# Patient Record
Sex: Female | Born: 1965 | Race: Black or African American | Hispanic: No | Marital: Married | Smoking: Former smoker
Health system: Southern US, Community
[De-identification: ages and names within clinical notes are randomized; demographics above are authoritative.]

## PROBLEM LIST (undated history)

## (undated) DIAGNOSIS — K219 Gastro-esophageal reflux disease without esophagitis: Secondary | ICD-10-CM

## (undated) DIAGNOSIS — J45909 Unspecified asthma, uncomplicated: Secondary | ICD-10-CM

## (undated) DIAGNOSIS — E119 Type 2 diabetes mellitus without complications: Secondary | ICD-10-CM

## (undated) DIAGNOSIS — E785 Hyperlipidemia, unspecified: Secondary | ICD-10-CM

## (undated) DIAGNOSIS — Z972 Presence of dental prosthetic device (complete) (partial): Secondary | ICD-10-CM

## (undated) DIAGNOSIS — K859 Acute pancreatitis without necrosis or infection, unspecified: Secondary | ICD-10-CM

## (undated) DIAGNOSIS — J449 Chronic obstructive pulmonary disease, unspecified: Secondary | ICD-10-CM

## (undated) DIAGNOSIS — J189 Pneumonia, unspecified organism: Secondary | ICD-10-CM

## (undated) DIAGNOSIS — R011 Cardiac murmur, unspecified: Secondary | ICD-10-CM

## (undated) HISTORY — PX: APPENDECTOMY: SHX54

## (undated) HISTORY — DX: Type 2 diabetes mellitus without complications: E11.9

## (undated) HISTORY — PX: LUMBAR FUSION: SHX111

## (undated) HISTORY — DX: Hyperlipidemia, unspecified: E78.5

## (undated) HISTORY — PX: OTHER SURGICAL HISTORY: SHX169

---

## 2009-01-27 ENCOUNTER — Emergency Department (HOSPITAL_COMMUNITY): Admission: EM | Admit: 2009-01-27 | Discharge: 2009-01-27 | Payer: Self-pay | Admitting: Emergency Medicine

## 2009-05-04 ENCOUNTER — Emergency Department (HOSPITAL_COMMUNITY): Admission: EM | Admit: 2009-05-04 | Discharge: 2009-05-04 | Payer: Self-pay | Admitting: Emergency Medicine

## 2009-05-23 ENCOUNTER — Emergency Department (HOSPITAL_COMMUNITY): Admission: EM | Admit: 2009-05-23 | Discharge: 2009-05-23 | Payer: Self-pay | Admitting: Emergency Medicine

## 2009-05-23 ENCOUNTER — Emergency Department (HOSPITAL_COMMUNITY): Admission: EM | Admit: 2009-05-23 | Discharge: 2009-05-24 | Payer: Self-pay | Admitting: Emergency Medicine

## 2009-08-17 ENCOUNTER — Emergency Department (HOSPITAL_COMMUNITY): Admission: EM | Admit: 2009-08-17 | Discharge: 2009-08-17 | Payer: Self-pay | Admitting: Emergency Medicine

## 2010-04-21 IMAGING — CT CT ABDOMEN W/O CM
2 of 4 series · 17 of 46 positions shown, 19 images · non-contrast
Comparison: None

CT ABDOMEN

CLINICAL DATA: Left flank and pelvic pain for 2 weeks getting
worse today.  Nausea.

CT OF THE ABDOMEN AND PELVIS WITHOUT CONTRAST (CT UROGRAM)
TECHNIQUE: Multidetector CT imaging was performed through the
abdomen and pelvis to include the urinary tract.

[Series 2: stone_wo 5.0 b40f st · axial · 0.73mm/px · z∈[-480,-96]mm · 14 of 106 slices shown, 16 images]
[im 5/106  soft-tissue]
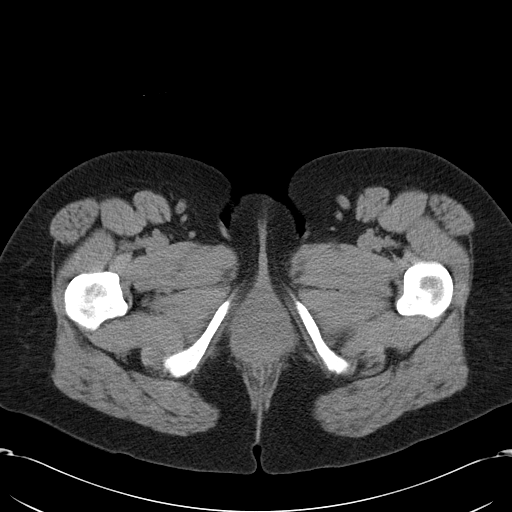
[im 5/106  bone]
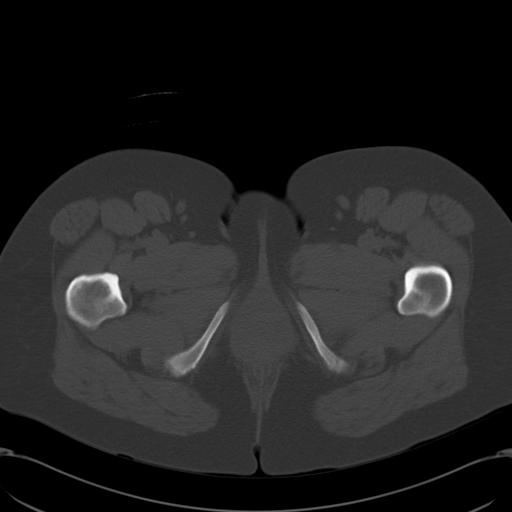
[im 14/106  soft-tissue]
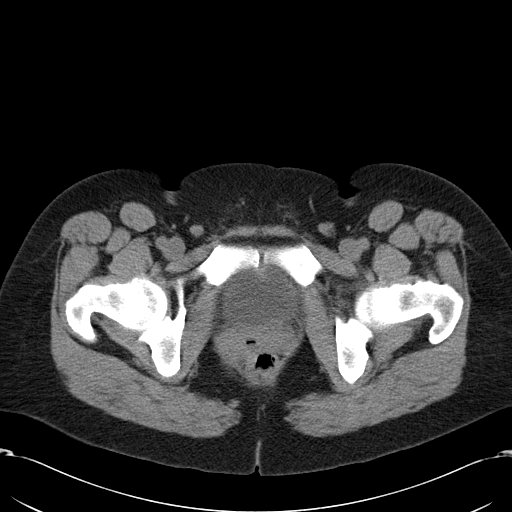
[im 22/106  soft-tissue]
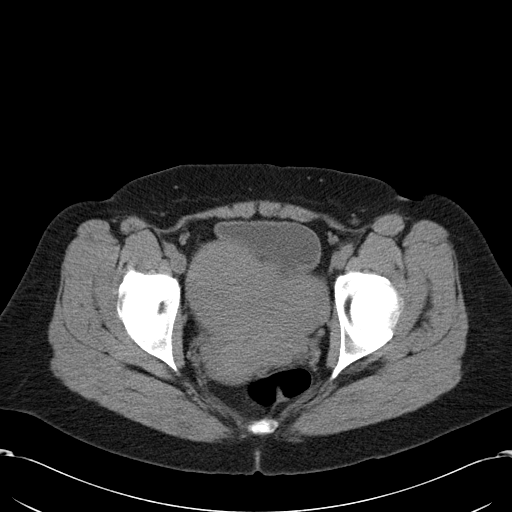
[im 27/106  soft-tissue]
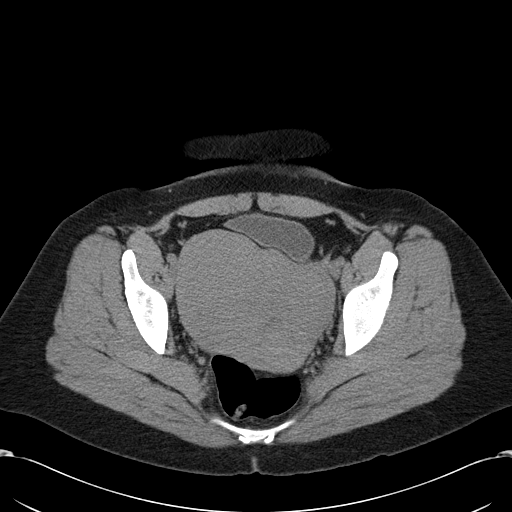
[im 36/106  soft-tissue]
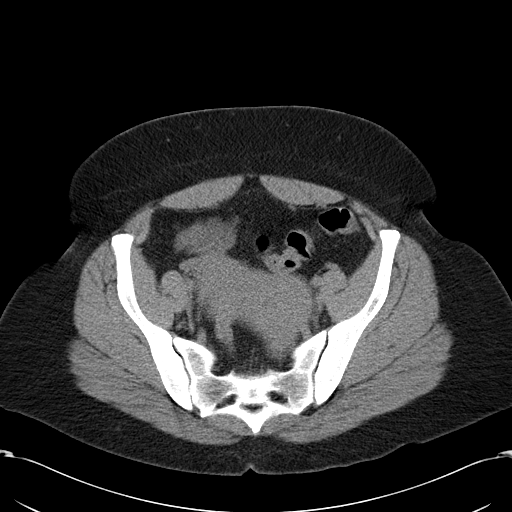
[im 44/106  soft-tissue]
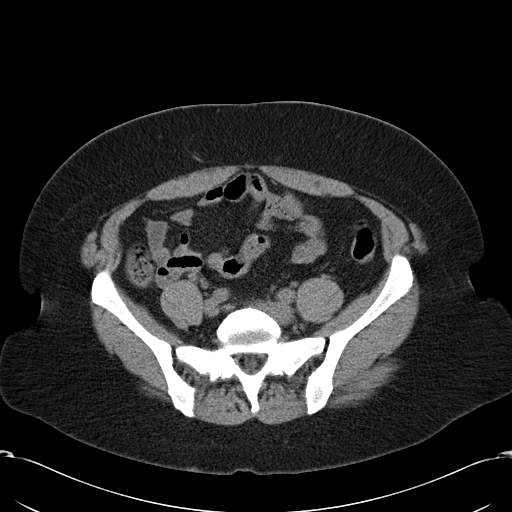
[im 49/106  soft-tissue]
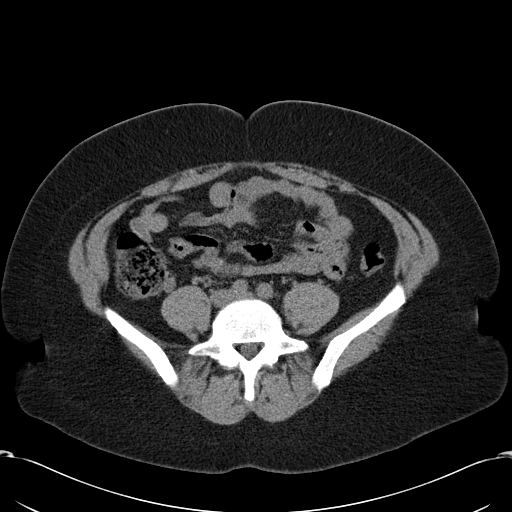
[im 57/106  soft-tissue]
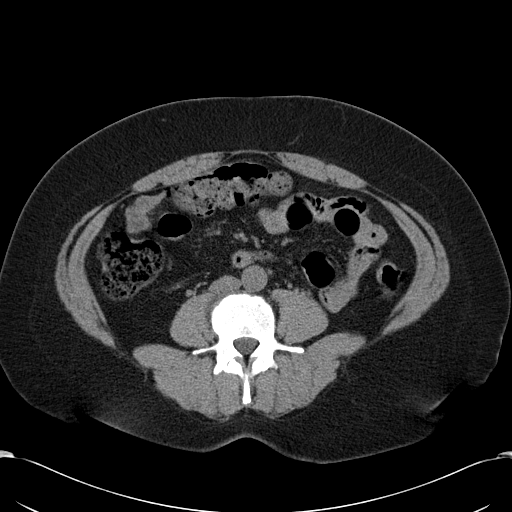
[im 62/106  soft-tissue]
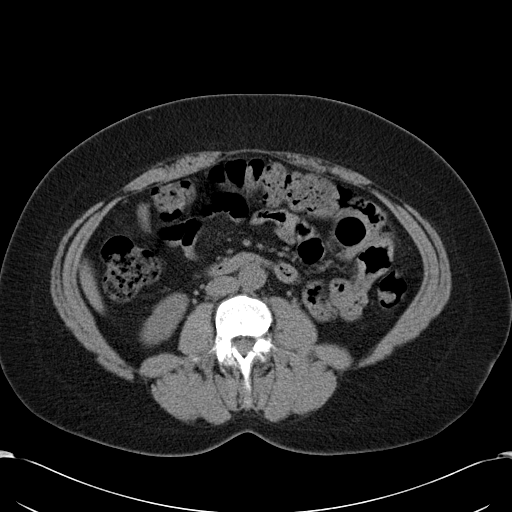
[im 62/106  bone]
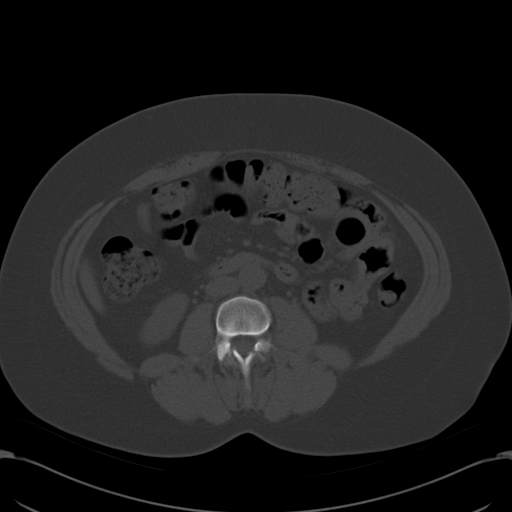
[im 71/106  soft-tissue]
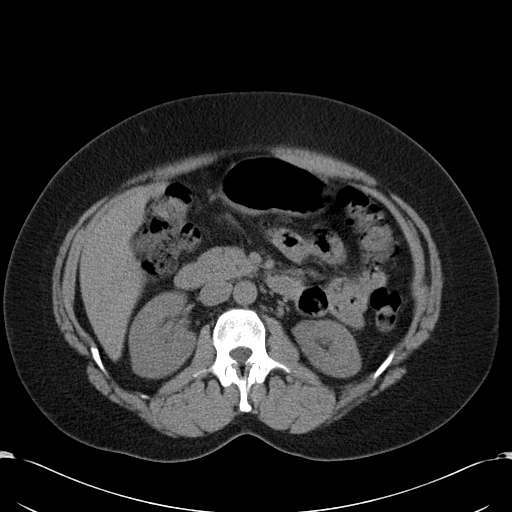
[im 79/106  soft-tissue]
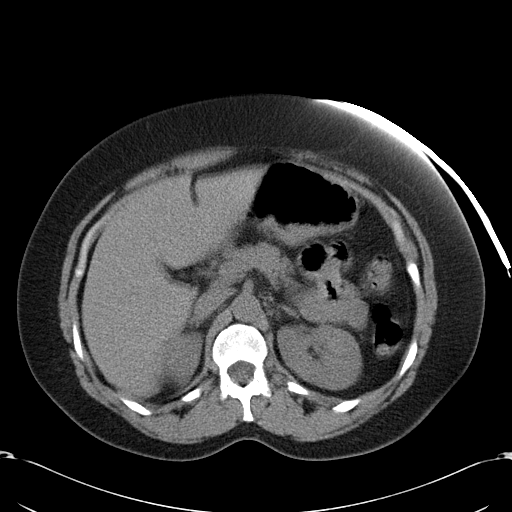
[im 84/106  soft-tissue]
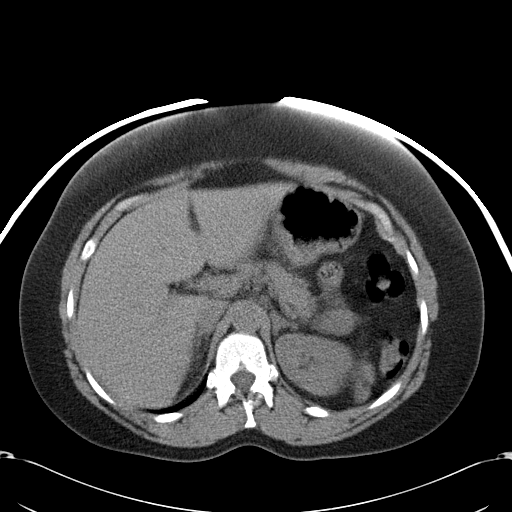
[im 92/106  soft-tissue]
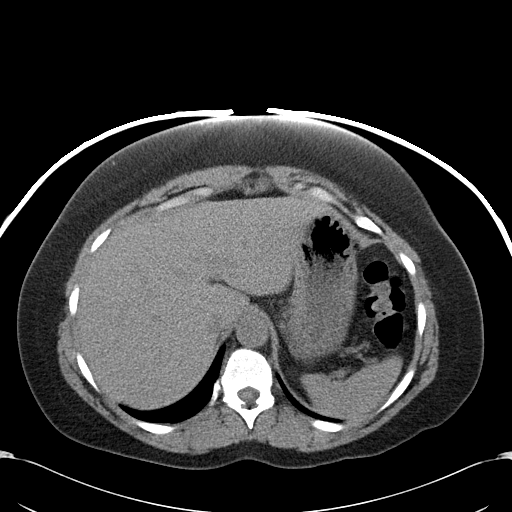
[im 101/106  soft-tissue]
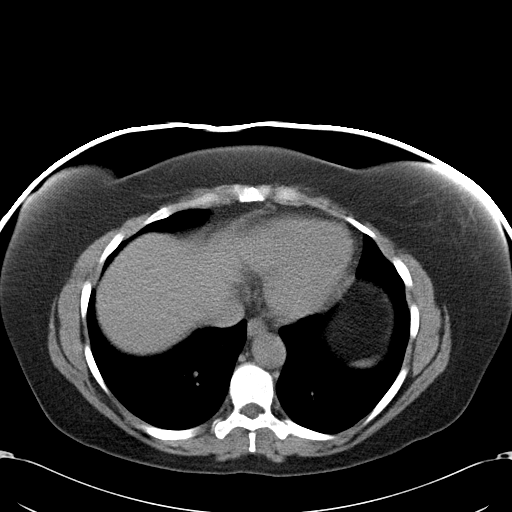

[Series 602: coronal · coronal · 0.86mm/px · 3 of 69 slices shown]
[im 23/69  soft-tissue]
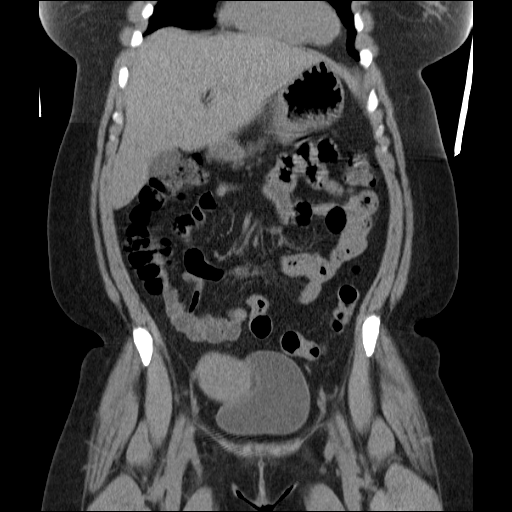
[im 31/69  soft-tissue]
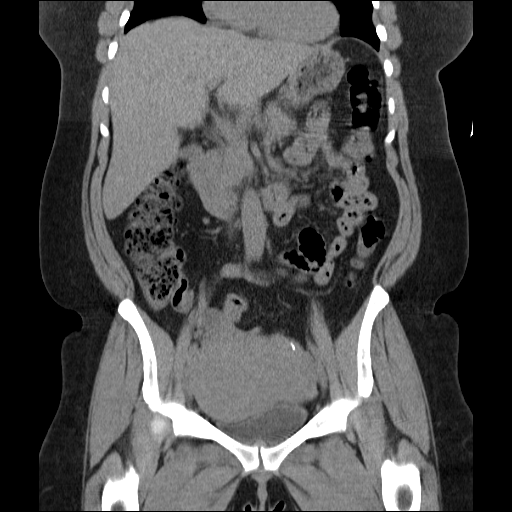
[im 38/69  soft-tissue]
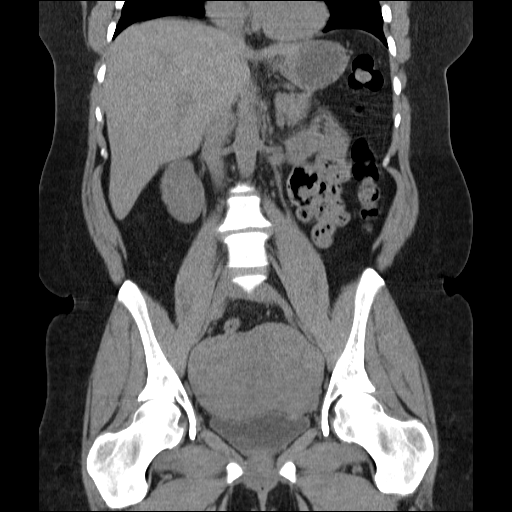

[17 of 46 positions shown; findings below may reference images not displayed]

FINDINGS: The liver, spleen, pancreas, adrenal glands, and kidneys
are normal.  The visualized bowel is normal.  There is no free air
or free fluid.  No bony abnormality. No renal or ureteral calculi.
IMPRESSION: Benign-appearing abdomen.

CT PELVIS
FINDINGS: The uterus and large is enlarged measuring 12 x 11 x 8
cm, probably due to fibroids.  The ovaries demonstrate no
significant abnormalities.  There is a 16 mm low density lesion on
the right ovary, consistent with a cyst.

There is no free fluid or other significant abnormality.  Bony
structures are normal.
IMPRESSION: No acute abnormalities.  Enlarged uterus secondary to fibroids.
Small cyst on the right ovary.

## 2011-01-11 LAB — COMPREHENSIVE METABOLIC PANEL
ALT: 18 U/L (ref 0–35)
AST: 22 U/L (ref 0–37)
Albumin: 3.7 g/dL (ref 3.5–5.2)
Alkaline Phosphatase: 75 U/L (ref 39–117)
BUN: 9 mg/dL (ref 6–23)
Chloride: 103 mEq/L (ref 96–112)
Potassium: 4.1 mEq/L (ref 3.5–5.1)
Sodium: 139 mEq/L (ref 135–145)
Total Bilirubin: 0.6 mg/dL (ref 0.3–1.2)
Total Protein: 8.1 g/dL (ref 6.0–8.3)

## 2011-01-11 LAB — DIFFERENTIAL
Basophils Absolute: 0.1 10*3/uL (ref 0.0–0.1)
Basophils Relative: 1 % (ref 0–1)
Eosinophils Absolute: 0.2 10*3/uL (ref 0.0–0.7)
Eosinophils Relative: 2 % (ref 0–5)
Monocytes Absolute: 1.2 10*3/uL — ABNORMAL HIGH (ref 0.1–1.0)
Monocytes Relative: 9 % (ref 3–12)
Neutro Abs: 7.8 10*3/uL — ABNORMAL HIGH (ref 1.7–7.7)

## 2011-01-11 LAB — CBC
HCT: 41.5 % (ref 36.0–46.0)
Platelets: 276 10*3/uL (ref 150–400)
RDW: 15.8 % — ABNORMAL HIGH (ref 11.5–15.5)
WBC: 13.8 10*3/uL — ABNORMAL HIGH (ref 4.0–10.5)

## 2011-01-11 LAB — URINALYSIS, ROUTINE W REFLEX MICROSCOPIC
Bilirubin Urine: NEGATIVE
Glucose, UA: NEGATIVE mg/dL
Hgb urine dipstick: NEGATIVE
Ketones, ur: NEGATIVE mg/dL
Protein, ur: NEGATIVE mg/dL
Urobilinogen, UA: 0.2 mg/dL (ref 0.0–1.0)

## 2011-01-11 LAB — ETHANOL: Alcohol, Ethyl (B): <5 mg/dL (ref 0–10)

## 2011-01-11 LAB — RAPID URINE DRUG SCREEN, HOSP PERFORMED
Benzodiazepines: NOT DETECTED
Cocaine: POSITIVE — AB
Opiates: NOT DETECTED
Tetrahydrocannabinol: POSITIVE — AB

## 2011-01-11 LAB — URINE MICROSCOPIC-ADD ON

## 2011-01-14 LAB — URINALYSIS, ROUTINE W REFLEX MICROSCOPIC
Bilirubin Urine: NEGATIVE
Glucose, UA: NEGATIVE mg/dL
Ketones, ur: NEGATIVE mg/dL
Nitrite: NEGATIVE
Protein, ur: NEGATIVE mg/dL
pH: 8 (ref 5.0–8.0)

## 2011-01-14 LAB — CBC
HCT: 40.5 % (ref 36.0–46.0)
HCT: 41.4 % (ref 36.0–46.0)
Hemoglobin: 13.4 g/dL (ref 12.0–15.0)
Hemoglobin: 14 g/dL (ref 12.0–15.0)
MCHC: 33.2 g/dL (ref 30.0–36.0)
Platelets: 311 10*3/uL (ref 150–400)
RBC: 4.37 MIL/uL (ref 3.87–5.11)
RDW: 14.4 % (ref 11.5–15.5)
WBC: 18.9 10*3/uL — ABNORMAL HIGH (ref 4.0–10.5)

## 2011-01-14 LAB — COMPREHENSIVE METABOLIC PANEL
ALT: 19 U/L (ref 0–35)
Albumin: 3.8 g/dL (ref 3.5–5.2)
Alkaline Phosphatase: 62 U/L (ref 39–117)
BUN: 7 mg/dL (ref 6–23)
CO2: 25 mEq/L (ref 19–32)
Calcium: 8.8 mg/dL (ref 8.4–10.5)
Chloride: 103 mEq/L (ref 96–112)
GFR calc non Af Amer: >60 mL/min (ref >60)
Glucose, Bld: 100 mg/dL — ABNORMAL HIGH (ref 70–99)
Glucose, Bld: 140 mg/dL — ABNORMAL HIGH (ref 70–99)
Potassium: 3.9 mEq/L (ref 3.5–5.1)
Sodium: 136 mEq/L (ref 135–145)
Total Bilirubin: 0.8 mg/dL (ref 0.3–1.2)
Total Protein: 6.9 g/dL (ref 6.0–8.3)
Total Protein: 7.6 g/dL (ref 6.0–8.3)

## 2011-01-14 LAB — DIFFERENTIAL
Basophils Absolute: 0 10*3/uL (ref 0.0–0.1)
Basophils Relative: 0 % (ref 0–1)
Eosinophils Absolute: 0.1 10*3/uL (ref 0.0–0.7)
Monocytes Relative: 5 % (ref 3–12)
Neutrophils Relative %: 79 % — ABNORMAL HIGH (ref 43–77)

## 2011-01-14 LAB — POCT PREGNANCY, URINE: Preg Test, Ur: NEGATIVE

## 2011-01-14 LAB — LIPASE, BLOOD: Lipase: 40 U/L (ref 11–59)

## 2011-01-15 LAB — CBC
HCT: 42.5 % (ref 36.0–46.0)
Hemoglobin: 14.2 g/dL (ref 12.0–15.0)
MCV: 95.1 fL (ref 78.0–100.0)
RBC: 4.47 MIL/uL (ref 3.87–5.11)
WBC: 15.8 10*3/uL — ABNORMAL HIGH (ref 4.0–10.5)

## 2011-01-15 LAB — POCT CARDIAC MARKERS: Myoglobin, poc: 58.1 ng/mL (ref 12–200)

## 2011-01-15 LAB — DIFFERENTIAL
Eosinophils Absolute: 0 10*3/uL (ref 0.0–0.7)
Eosinophils Relative: 0 % (ref 0–5)
Lymphs Abs: 2.4 10*3/uL (ref 0.7–4.0)
Monocytes Absolute: 0.7 10*3/uL (ref 0.1–1.0)
Monocytes Relative: 4 % (ref 3–12)

## 2011-01-15 LAB — CARDIAC PANEL(CRET KIN+CKTOT+MB+TROPI): Relative Index: 0.7 (ref 0.0–2.5)

## 2011-01-15 LAB — AMYLASE: Amylase: 138 U/L — ABNORMAL HIGH (ref 27–131)

## 2012-06-07 ENCOUNTER — Emergency Department (HOSPITAL_COMMUNITY)
Admission: EM | Admit: 2012-06-07 | Discharge: 2012-06-08 | Disposition: A | Discharge status: Home / Self Care | Payer: Medicaid Other | Attending: Emergency Medicine | Admitting: Emergency Medicine

## 2012-06-07 ENCOUNTER — Encounter (HOSPITAL_COMMUNITY): Payer: Self-pay | Admitting: *Deleted

## 2012-06-07 DIAGNOSIS — R10819 Abdominal tenderness, unspecified site: Secondary | ICD-10-CM | POA: Insufficient documentation

## 2012-06-07 DIAGNOSIS — R110 Nausea: Secondary | ICD-10-CM | POA: Insufficient documentation

## 2012-06-07 DIAGNOSIS — K7689 Other specified diseases of liver: Secondary | ICD-10-CM | POA: Insufficient documentation

## 2012-06-07 DIAGNOSIS — I498 Other specified cardiac arrhythmias: Secondary | ICD-10-CM | POA: Insufficient documentation

## 2012-06-07 DIAGNOSIS — R1013 Epigastric pain: Secondary | ICD-10-CM

## 2012-06-07 DIAGNOSIS — F411 Generalized anxiety disorder: Secondary | ICD-10-CM | POA: Insufficient documentation

## 2012-06-07 LAB — CBC WITH DIFFERENTIAL/PLATELET
Basophils Absolute: 0.1 10*3/uL (ref 0.0–0.1)
Basophils Relative: 0 % (ref 0–1)
Hemoglobin: 14.3 g/dL (ref 12.0–15.0)
MCHC: 33.3 g/dL (ref 30.0–36.0)
Monocytes Relative: 7 % (ref 3–12)
Neutro Abs: 7.7 10*3/uL (ref 1.7–7.7)
Neutrophils Relative %: 55 % (ref 43–77)

## 2012-06-07 LAB — URINALYSIS, ROUTINE W REFLEX MICROSCOPIC
Glucose, UA: NEGATIVE mg/dL
Leukocytes, UA: NEGATIVE
Protein, ur: NEGATIVE mg/dL
Specific Gravity, Urine: 1.017 (ref 1.005–1.030)
pH: 5.5 (ref 5.0–8.0)

## 2012-06-07 LAB — PREGNANCY, URINE: Preg Test, Ur: NEGATIVE

## 2012-06-07 NOTE — ED Notes (Signed)
Lab called  The blood has clotted and the orders need to ne  Placed and blood drawn again

## 2012-06-07 NOTE — ED Notes (Signed)
Generalized abd pain for one week .  No bm for 2 days nauseated.  lmp 3 months ago

## 2012-06-07 NOTE — ED Notes (Signed)
NURSE EXPLAINED DELAY / PROCESS.  

## 2012-06-08 ENCOUNTER — Emergency Department (HOSPITAL_COMMUNITY): Payer: Medicaid Other

## 2012-06-08 LAB — POCT I-STAT TROPONIN I: Troponin i, poc: 0 ng/mL (ref 0.00–0.08)

## 2012-06-08 MED ORDER — IOHEXOL 300 MG/ML  SOLN
100.0000 mL | Freq: Once | INTRAMUSCULAR | Status: AC | PRN
  Administered 2012-06-08: 100 mL via INTRAVENOUS

## 2012-06-08 MED ORDER — MORPHINE SULFATE 4 MG/ML IJ SOLN
4.0000 mg | Freq: Once | INTRAMUSCULAR | Status: AC
  Administered 2012-06-08: 4 mg via INTRAVENOUS
  Filled 2012-06-08: qty 1

## 2012-06-08 MED ORDER — ESOMEPRAZOLE MAGNESIUM 40 MG PO CPDR: 40.0000 mg | DELAYED_RELEASE_CAPSULE | Freq: Every day | ORAL | Status: DC

## 2012-06-08 MED ORDER — SODIUM CHLORIDE 0.9 % IV BOLUS (SEPSIS)
1000.0000 mL | Freq: Once | INTRAVENOUS | Status: AC
  Administered 2012-06-08: 1000 mL via INTRAVENOUS

## 2012-06-08 MED ORDER — ONDANSETRON HCL 4 MG PO TABS: 4.0000 mg | ORAL_TABLET | Freq: Four times a day (QID) | ORAL | Status: AC

## 2012-06-08 MED ORDER — ONDANSETRON HCL 4 MG/2ML IJ SOLN
4.0000 mg | Freq: Once | INTRAMUSCULAR | Status: AC
  Administered 2012-06-08: 4 mg via INTRAVENOUS
  Filled 2012-06-08: qty 2

## 2012-06-08 MED ORDER — FAMOTIDINE IN NACL 20-0.9 MG/50ML-% IV SOLN
20.0000 mg | Freq: Once | INTRAVENOUS | Status: AC
  Administered 2012-06-08: 20 mg via INTRAVENOUS
  Filled 2012-06-08: qty 50

## 2012-06-08 MED ORDER — IOHEXOL 300 MG/ML  SOLN
20.0000 mL | INTRAMUSCULAR | Status: AC
  Administered 2012-06-08: 20 mL via ORAL

## 2012-06-08 NOTE — ED Notes (Signed)
PT TRANSPORTED TO ULTRASOUND

## 2012-06-08 NOTE — ED Notes (Signed)
CT informed that pt has finished drinking contrast

## 2012-06-08 NOTE — ED Provider Notes (Signed)
History     CSN: [id number]  Arrival date & time 06/07/12  2001   First MD Initiated Contact with Patient 06/08/12 0010      Chief Complaint  Patient presents with  . Abdominal Pain    (Consider location/radiation/quality/duration/timing/severity/associated sxs/prior treatment) HPI  [name] is a 46 year old woman with no significant past medical history other than GERD. She presents with complaints of epigastric pain and right upper cord or abdominal pain. She developed right upper quadrant abdominal pain about 2 weeks ago. This pain has been intermittent but daily. It seems to come on without any precipitating factors or events. It lasts anywhere from 30 minutes to several hours at a time and then resolves without intervention. The patient has associated nausea but no vomiting. She has not experienced associated fever, diarrhea, melena, hematochezia or genitourinary symptoms.  She comes in today because her pain has shifted to the midline and has become more severe. Her pain has been 8/10 in maximum severity. It is aching and radiates from the midline epigastrium to right upper quadrant. Again, no fevers. Patient's only previous abdominal surgery is remote appendectomy.  Patient says she is pain free at the time of my initial exam.   PMH: tobacco abuse, GERD  PSH: appendectomy  No family history on file.  History  Substance Use Topics  . Smoking status: Current Everyday Smoker  . Smokeless tobacco: Not on file  . Alcohol Use: Yes    OB History    Grav Para Term Preterm Abortions TAB SAB Ect Mult Living                  Review of Systems  Gen: no weight loss, fevers, chills, night sweats Eyes: no discharge or drainage, no occular pain or visual changes Nose: no epistaxis or rhinorrhea Mouth: no dental pain, no sore throat Neck: no neck pain Lungs: no SOB, cough, wheezing CV: no chest pain, palpitations, dependent edema or orthopnea Abd: as per hpi, otherwise  negative GU: no dysuria or gross hematuria MSK: no myalgias or arthralgias Neuro: no headache, no focal neurologic deficits Skin: no rash Psyche: negative.  Allergies  Review of patient's allergies indicates no known allergies.  Home Medications  No current outpatient prescriptions on file.  BP 123/66  Pulse 70  Temp 98.9 F (37.2 C) (Oral)  Resp 19  SpO2 100%  LMP 03/07/2012  Physical Exam  Gen: well developed and well nourished appearing Head: NCAT Eyes: PERL, EOMI Nose: no epistaixis or rhinorrhea Mouth/throat: mucosa is moist and pink Neck: supple, no stridor CV: RRR, no murmur, good peripheral pulses Lungs: CTA B, no wheezing, rhonchi or rales Abd: soft, ttp over the RUQ with positive Murphy's sign, nondistended, mild midline epigastric ttp without guarding Back: no ttp, no cva ttp Skin: no rashese, wnl, well perfused appearing Neuro: CN ii-xii grossly intact, no focal deficits Psyche; mildly anxious affect,  calm and cooperative.   ED Course  Procedures (including critical care time)  Labs Reviewed  CBC WITH DIFFERENTIAL - Abnormal; Notable for the following:    WBC 14.1 (*)     Lymphs Abs 5.1 (*)     All other components within normal limits  URINALYSIS, ROUTINE W REFLEX MICROSCOPIC  PREGNANCY, URINE  COMPREHENSIVE METABOLIC PANEL   US Abdomen Complete  06/08/2012  *RADIOLOGY REPORT*  Clinical Data:  Right upper quadrant abdominal pain  ABDOMINAL ULTRASOUND COMPLETE  Comparison:  05/23/2009  Findings:  Gallbladder:  No gallstones, gallbladder wall thickening, or pericholecystic  fluid.  Common Bile Duct:  Within normal limits in caliber.  Liver: Appears echogenic suggesting fatty infiltration.  IVC:  Appears normal.  Pancreas:  Limited due to overlying bowel gas.  No abnormality noted.  Spleen:  Within normal limits in size and echotexture.  Right kidney:  Normal in size and parenchymal echogenicity.  No evidence of mass or hydronephrosis.  Left kidney:  Normal  in size and parenchymal echogenicity.  No evidence of mass or hydronephrosis.  Abdominal Aorta:  No aneurysm identified.  IMPRESSION:  1.  No acute findings. 2.  Fatty infiltration the liver.   Original Report Authenticated By: Rosealee Albee, M.D.     Jardine.Pyle: Patient re-evaluated by me at approx 0200. At that time, she was in tears and complaining of recurrent pain. She was treated with MS, IVF and pepcid. Shortly after that, transported to U/S.  Pain improved. U/S non-diagnostic. At this point, we will pursue CT of the abd/pelvis with iv contrast to rule out colitis, evaluate for mesenteric ischemia, perforated viscous. We will obtain EKG as well as lactate level.  Work up is thus far non-diagnostic but is ongoing.   0350:  Patient re-evaluated. She is feeling better. Pain resolved after tx with MS, IVF and Zofran.   EKG: sinus bradycardia, no acute ischemic changes, normal intervals, normal axis, normal qrs complex. No olds for comparison.    MDM   ED workup is nondiagnostic but notable for white blood cells count of 14,000 in the setting of epigastric pain right upper quadrant pain. We have ruled out acute cholecystitis and gallstones with right upper quadrant ultrasound. The patient's lipase is normal ruling out pancreatitis. Repeat abdominal exam is benign. Episodic nature of pain does make the query acalculus cholecystitis v. PUD v gastritis.  No signs of colitis, SBO or any other acute process on contrasted CT scan.  The patient is tolerating po, feeling better and asking to go home.   She is stable for discharge referral for close outpatient followup and instructions that she should discuss indication for HIDA scan with her outpatient physician. We will also start her on Nexium. She has been taking an over-the-counter H2 blocking medication.  The patient is counseled to please return to the emergency department for worsening symptoms, fever or any other health concerns.        Brandt Loosen, MD 06/08/12 [id number]

## 2012-11-04 ENCOUNTER — Ambulatory Visit
Admission: RE | Admit: 2012-11-04 | Discharge: 2012-11-04 | Disposition: A | Discharge status: Home / Self Care | Payer: Medicaid Other | Source: Ambulatory Visit | Attending: Family Medicine | Admitting: Family Medicine

## 2012-11-04 ENCOUNTER — Other Ambulatory Visit: Payer: Self-pay | Admitting: Family Medicine

## 2012-11-04 DIAGNOSIS — M1250 Traumatic arthropathy, unspecified site: Secondary | ICD-10-CM

## 2012-12-04 ENCOUNTER — Encounter: Payer: Self-pay | Admitting: Dietician

## 2012-12-04 ENCOUNTER — Encounter: Payer: Medicaid Other | Attending: Family Medicine | Admitting: *Deleted

## 2012-12-04 ENCOUNTER — Encounter: Payer: Self-pay | Admitting: *Deleted

## 2012-12-04 DIAGNOSIS — E119 Type 2 diabetes mellitus without complications: Secondary | ICD-10-CM | POA: Insufficient documentation

## 2012-12-04 DIAGNOSIS — Z713 Dietary counseling and surveillance: Secondary | ICD-10-CM | POA: Insufficient documentation

## 2012-12-04 NOTE — Progress Notes (Signed)
  Medical Nutrition Therapy:  Appt start time: 1130 end time:  1230.  Assessment:  Primary concerns today: patient here due to new diagnosis of diabetes. She does not work, lives with husband and 3 grand daughters ages 35-12. She does the grocery shopping and food preparation for the family. She became teary when talking about her diagnosis of diabetes stating she was scared and didn't know what she was going to do. Activity is limited to small amounts of walking her girls to the bus stop each AM.   MEDICATIONS: see list   DIETARY INTAKE:  Usual eating pattern includes 3 meals and 0-1 snacks per day.  Everyday foods include good variety of all food groups.  Avoided foods include : none stated.    24-hr recall:  B ( AM): coffee with 2 tsp sugar and extra cream  Snk ( AM): none  L ( PM): prepares most meals at home: fries and a burger OR fish OR anything around, water Snk ( PM): maybe cake or cookies if in the house D ( PM): meat, starch and vegetable type meal OR eat out on Saturday usually or an easy meal at home, water Snk ( PM): cake, cookies etc that she bakes Beverages: coffee with cream and water  Usual physical activity: has back problems, walks to bus stop for kids school, cleans house daily  Estimated energy needs: 1400 calories 158 g carbohydrates 105 g protein 39 g fat  Progress Towards Goal(s):  In progress.   Nutritional Diagnosis:  NB-1.1 Food and nutrition-related knowledge deficit As related to new diagnosis of diabetes.  As evidenced by A1c of 7.2%.    Intervention:  Nutrition counseling and diabetes education initiated. Discussed basic physiology of diabetes, SMBG and rationale of checking BG at alternate times of day, A1c, Carb Counting and reading food labels. Plan to discuss benefits of increased activity at next visit.   Plan:  Aim for 3 Carb Choices per meal (45 grams) +/- 1 either way  Aim for 0-1 Carbs per snack if hungry  Consider reading food labels for  Total Carbohydrate of foods Consider asking your MD about you checking BG at alternate times per day  Handouts given during visit include: Living Well with Diabetes Carb Counting and Food Label handouts Meal Plan Card  Monitoring/Evaluation:  Dietary intake, exercise, reading food labels, and body weight in 4 week(s).

## 2012-12-04 NOTE — Patient Instructions (Addendum)
Plan:  Aim for 3 Carb Choices per meal (45 grams) +/- 1 either way  Aim for 0-1 Carbs per snack if hungry  Consider reading food labels for Total Carbohydrate of foods Consider asking your MD about you checking BG at alternate times per day

## 2012-12-25 ENCOUNTER — Encounter: Payer: Self-pay | Admitting: *Deleted

## 2013-01-01 ENCOUNTER — Ambulatory Visit: Payer: Medicaid Other | Admitting: *Deleted

## 2013-01-06 ENCOUNTER — Ambulatory Visit: Payer: Medicaid Other | Attending: Family Medicine

## 2013-01-06 DIAGNOSIS — R293 Abnormal posture: Secondary | ICD-10-CM | POA: Insufficient documentation

## 2013-01-06 DIAGNOSIS — IMO0001 Reserved for inherently not codable concepts without codable children: Secondary | ICD-10-CM | POA: Insufficient documentation

## 2013-01-06 DIAGNOSIS — R5381 Other malaise: Secondary | ICD-10-CM | POA: Insufficient documentation

## 2013-01-06 DIAGNOSIS — M545 Low back pain, unspecified: Secondary | ICD-10-CM | POA: Insufficient documentation

## 2013-01-06 DIAGNOSIS — M25559 Pain in unspecified hip: Secondary | ICD-10-CM | POA: Insufficient documentation

## 2013-01-13 ENCOUNTER — Ambulatory Visit: Payer: Medicaid Other | Attending: Family Medicine

## 2013-01-13 ENCOUNTER — Other Ambulatory Visit: Payer: Self-pay | Admitting: Family Medicine

## 2013-01-13 DIAGNOSIS — R5381 Other malaise: Secondary | ICD-10-CM | POA: Insufficient documentation

## 2013-01-13 DIAGNOSIS — IMO0001 Reserved for inherently not codable concepts without codable children: Secondary | ICD-10-CM | POA: Insufficient documentation

## 2013-01-13 DIAGNOSIS — M25559 Pain in unspecified hip: Secondary | ICD-10-CM | POA: Insufficient documentation

## 2013-01-13 DIAGNOSIS — R293 Abnormal posture: Secondary | ICD-10-CM | POA: Insufficient documentation

## 2013-01-13 DIAGNOSIS — M545 Low back pain, unspecified: Secondary | ICD-10-CM | POA: Insufficient documentation

## 2013-01-14 ENCOUNTER — Ambulatory Visit
Admission: RE | Admit: 2013-01-14 | Discharge: 2013-01-14 | Disposition: A | Discharge status: Home / Self Care | Payer: Medicaid Other | Source: Ambulatory Visit | Attending: Family Medicine | Admitting: Family Medicine

## 2013-01-14 DIAGNOSIS — M545 Low back pain: Secondary | ICD-10-CM

## 2013-01-20 ENCOUNTER — Ambulatory Visit: Payer: Medicaid Other

## 2013-01-27 ENCOUNTER — Ambulatory Visit: Payer: Medicaid Other

## 2013-07-16 ENCOUNTER — Other Ambulatory Visit: Payer: Self-pay | Admitting: Orthopedic Surgery

## 2013-07-18 ENCOUNTER — Other Ambulatory Visit: Payer: Self-pay | Admitting: Orthopedic Surgery

## 2013-07-24 ENCOUNTER — Encounter (HOSPITAL_COMMUNITY): Payer: Self-pay | Admitting: Pharmacy Technician

## 2013-07-25 ENCOUNTER — Encounter (HOSPITAL_COMMUNITY)
Admission: RE | Admit: 2013-07-25 | Discharge: 2013-07-25 | Disposition: A | Discharge status: Home / Self Care | Payer: Medicaid Other | Source: Ambulatory Visit | Attending: Orthopedic Surgery | Admitting: Orthopedic Surgery

## 2013-07-25 ENCOUNTER — Encounter (HOSPITAL_COMMUNITY): Payer: Self-pay

## 2013-07-25 DIAGNOSIS — Z01812 Encounter for preprocedural laboratory examination: Secondary | ICD-10-CM | POA: Insufficient documentation

## 2013-07-25 DIAGNOSIS — Z01818 Encounter for other preprocedural examination: Secondary | ICD-10-CM | POA: Insufficient documentation

## 2013-07-25 DIAGNOSIS — Z01810 Encounter for preprocedural cardiovascular examination: Secondary | ICD-10-CM | POA: Insufficient documentation

## 2013-07-25 HISTORY — DX: Cardiac murmur, unspecified: R01.1

## 2013-07-25 HISTORY — DX: Gastro-esophageal reflux disease without esophagitis: K21.9

## 2013-07-25 LAB — CBC WITH DIFFERENTIAL/PLATELET
Eosinophils Relative: 2 % (ref 0–5)
Lymphocytes Relative: 34 % (ref 12–46)
Lymphs Abs: 4.8 10*3/uL — ABNORMAL HIGH (ref 0.7–4.0)
MCV: 92.6 fL (ref 78.0–100.0)
Monocytes Relative: 6 % (ref 3–12)
Neutrophils Relative %: 58 % (ref 43–77)
Platelets: 280 10*3/uL (ref 150–400)
RBC: 5.29 MIL/uL — ABNORMAL HIGH (ref 3.87–5.11)
WBC: 14.2 10*3/uL — ABNORMAL HIGH (ref 4.0–10.5)

## 2013-07-25 LAB — COMPREHENSIVE METABOLIC PANEL
ALT: 27 U/L (ref 0–35)
AST: 24 U/L (ref 0–37)
Alkaline Phosphatase: 86 U/L (ref 39–117)
CO2: 23 mEq/L (ref 19–32)
Calcium: 10 mg/dL (ref 8.4–10.5)
Chloride: 101 mEq/L (ref 96–112)
GFR calc Af Amer: 87 mL/min — ABNORMAL LOW (ref >90)
GFR calc non Af Amer: 75 mL/min — ABNORMAL LOW (ref >90)
Glucose, Bld: 161 mg/dL — ABNORMAL HIGH (ref 70–99)
Potassium: 4.5 mEq/L (ref 3.5–5.1)
Sodium: 138 mEq/L (ref 135–145)
Total Protein: 8.5 g/dL — ABNORMAL HIGH (ref 6.0–8.3)

## 2013-07-25 LAB — ABO/RH: ABO/RH(D): A NEG

## 2013-07-25 LAB — URINALYSIS, ROUTINE W REFLEX MICROSCOPIC
Glucose, UA: 100 mg/dL — AB
Hgb urine dipstick: NEGATIVE
Ketones, ur: 15 mg/dL — AB
Leukocytes, UA: NEGATIVE
Specific Gravity, Urine: 1.026 (ref 1.005–1.030)
Urobilinogen, UA: 0.2 mg/dL (ref 0.0–1.0)

## 2013-07-25 LAB — TYPE AND SCREEN

## 2013-07-25 LAB — SURGICAL PCR SCREEN
MRSA, PCR: NEGATIVE
Staphylococcus aureus: NEGATIVE

## 2013-07-25 NOTE — Pre-Procedure Instructions (Signed)
[name]  07/25/2013   Your procedure is scheduled on:  October 23  Report to [facility] Entrance "A" [address] at [employer] [phone] at 8 am for arrival time  Call this number if you have problems the morning of surgery: [phone]   Remember:   Do not eat food or drink liquids after midnight.   Take these medicines the morning of surgery with A SIP OF WATER: Albuterol (if needed), Hydrocodone, Omeprazole   Do not wear jewelry, make-up or nail polish.  Do not wear lotions, powders, or perfumes. You may wear deodorant.  Do not shave 48 hours prior to surgery. Men may shave face and neck.  Do not bring valuables to the hospital.  [facility] is not responsible                  for any belongings or valuables.               Contacts, dentures or bridgework may not be worn into surgery.  Leave suitcase in the car. After surgery it may be brought to your room.  For patients admitted to the hospital, discharge time is determined by your                treatment team.               Special Instructions: Shower using CHG 2 nights before surgery and the night before surgery.  If you shower the day of surgery use CHG.  Use special wash - you have one bottle of CHG for all showers.  You should use approximately 1/3 of the bottle for each shower.   Please read over the following fact sheets that you were given: Pain Booklet, Coughing and Deep Breathing, Blood Transfusion Information, MRSA Information and Surgical Site Infection Prevention

## 2013-07-30 MED ORDER — CEFAZOLIN SODIUM-DEXTROSE 2-3 GM-% IV SOLR
2.0000 g | INTRAVENOUS | Status: AC
  Administered 2013-07-31: 2 g via INTRAVENOUS
  Filled 2013-07-30: qty 50

## 2013-07-30 NOTE — Progress Notes (Signed)
PATIENT STATES SHE WAS TOLD TO ARRIVE AT 1000 AM.

## 2013-07-31 ENCOUNTER — Inpatient Hospital Stay (HOSPITAL_COMMUNITY)
Admission: RE | Admit: 2013-07-31 | Discharge: 2013-08-03 | DRG: 455 | Disposition: A | Discharge status: Home / Self Care | Payer: Medicaid Other | Source: Ambulatory Visit | Attending: Orthopedic Surgery | Admitting: Orthopedic Surgery

## 2013-07-31 ENCOUNTER — Encounter (HOSPITAL_COMMUNITY)
Admission: RE | Disposition: A | Discharge status: Home / Self Care | Payer: Self-pay | Source: Ambulatory Visit | Attending: Orthopedic Surgery

## 2013-07-31 ENCOUNTER — Inpatient Hospital Stay (HOSPITAL_COMMUNITY): Payer: Medicaid Other | Admitting: Anesthesiology

## 2013-07-31 ENCOUNTER — Inpatient Hospital Stay (HOSPITAL_COMMUNITY): Payer: Medicaid Other

## 2013-07-31 ENCOUNTER — Encounter (HOSPITAL_COMMUNITY): Payer: Self-pay | Admitting: Anesthesiology

## 2013-07-31 ENCOUNTER — Encounter (HOSPITAL_COMMUNITY): Payer: Medicaid Other | Admitting: Anesthesiology

## 2013-07-31 DIAGNOSIS — K219 Gastro-esophageal reflux disease without esophagitis: Secondary | ICD-10-CM | POA: Diagnosis present

## 2013-07-31 DIAGNOSIS — Z79899 Other long term (current) drug therapy: Secondary | ICD-10-CM

## 2013-07-31 DIAGNOSIS — E119 Type 2 diabetes mellitus without complications: Secondary | ICD-10-CM | POA: Diagnosis present

## 2013-07-31 DIAGNOSIS — M48061 Spinal stenosis, lumbar region without neurogenic claudication: Principal | ICD-10-CM | POA: Diagnosis present

## 2013-07-31 DIAGNOSIS — F17200 Nicotine dependence, unspecified, uncomplicated: Secondary | ICD-10-CM | POA: Diagnosis present

## 2013-07-31 DIAGNOSIS — E785 Hyperlipidemia, unspecified: Secondary | ICD-10-CM | POA: Diagnosis present

## 2013-07-31 HISTORY — PX: ANTERIOR LAT LUMBAR FUSION: SHX1168

## 2013-07-31 LAB — POCT I-STAT 4, (NA,K, GLUC, HGB,HCT)
Glucose, Bld: 158 mg/dL — ABNORMAL HIGH (ref 70–99)
Potassium: 4.1 mEq/L (ref 3.5–5.1)
Sodium: 139 mEq/L (ref 135–145)

## 2013-07-31 LAB — GLUCOSE, CAPILLARY
Glucose-Capillary: 132 mg/dL — ABNORMAL HIGH (ref 70–99)
Glucose-Capillary: 193 mg/dL — ABNORMAL HIGH (ref 70–99)

## 2013-07-31 SURGERY — ANTERIOR LATERAL LUMBAR FUSION 1 LEVEL
Anesthesia: General | Site: Spine Lumbar | Wound class: Clean

## 2013-07-31 MED ORDER — MORPHINE SULFATE (PF) 1 MG/ML IV SOLN
INTRAVENOUS | Status: AC
  Administered 2013-07-31: 7 mg via INTRAVENOUS
  Filled 2013-07-31: qty 25

## 2013-07-31 MED ORDER — HYDROMORPHONE HCL PF 1 MG/ML IJ SOLN
0.2500 mg | INTRAMUSCULAR | Status: DC | PRN
  Administered 2013-07-31 (×3): 0.5 mg via INTRAVENOUS

## 2013-07-31 MED ORDER — ONDANSETRON HCL 4 MG/2ML IJ SOLN
INTRAMUSCULAR | Status: DC | PRN
  Administered 2013-07-31: 4 mg via INTRAMUSCULAR

## 2013-07-31 MED ORDER — ONDANSETRON HCL 4 MG/2ML IJ SOLN
4.0000 mg | INTRAMUSCULAR | Status: DC | PRN
  Administered 2013-08-01: 4 mg via INTRAVENOUS
  Filled 2013-07-31: qty 2

## 2013-07-31 MED ORDER — MEPERIDINE HCL 25 MG/ML IJ SOLN: 6.2500 mg | INTRAMUSCULAR | Status: DC | PRN

## 2013-07-31 MED ORDER — PANTOPRAZOLE SODIUM 40 MG PO TBEC
80.0000 mg | DELAYED_RELEASE_TABLET | Freq: Every day | ORAL | Status: DC
  Administered 2013-07-31 – 2013-08-03 (×4): 80 mg via ORAL
  Filled 2013-07-31 (×4): qty 2

## 2013-07-31 MED ORDER — LACTATED RINGERS IV SOLN
INTRAVENOUS | Status: DC | PRN
  Administered 2013-07-31 (×2): via INTRAVENOUS

## 2013-07-31 MED ORDER — ALBUTEROL SULFATE HFA 108 (90 BASE) MCG/ACT IN AERS
2.0000 | INHALATION_SPRAY | Freq: Four times a day (QID) | RESPIRATORY_TRACT | Status: DC | PRN
  Filled 2013-07-31: qty 6.7

## 2013-07-31 MED ORDER — METFORMIN HCL 500 MG PO TABS
500.0000 mg | ORAL_TABLET | Freq: Two times a day (BID) | ORAL | Status: DC
  Administered 2013-07-31 – 2013-08-03 (×6): 500 mg via ORAL
  Filled 2013-07-31 (×8): qty 1

## 2013-07-31 MED ORDER — BUPIVACAINE-EPINEPHRINE PF 0.25-1:200000 % IJ SOLN
INTRAMUSCULAR | Status: AC
  Filled 2013-07-31: qty 30

## 2013-07-31 MED ORDER — PHENOL 1.4 % MT LIQD: 1.0000 | OROMUCOSAL | Status: DC | PRN

## 2013-07-31 MED ORDER — ONDANSETRON HCL 4 MG/2ML IJ SOLN: 4.0000 mg | Freq: Once | INTRAMUSCULAR | Status: DC | PRN

## 2013-07-31 MED ORDER — THROMBIN 20000 UNITS EX SOLR
CUTANEOUS | Status: AC
  Filled 2013-07-31: qty 20000

## 2013-07-31 MED ORDER — HYDROMORPHONE HCL PF 1 MG/ML IJ SOLN
INTRAMUSCULAR | Status: AC
  Filled 2013-07-31: qty 1

## 2013-07-31 MED ORDER — SODIUM CHLORIDE 0.9 % IJ SOLN
3.0000 mL | Freq: Two times a day (BID) | INTRAMUSCULAR | Status: DC
  Administered 2013-08-01 – 2013-08-02 (×2): 3 mL via INTRAVENOUS

## 2013-07-31 MED ORDER — THROMBIN 20000 UNITS EX SOLR
CUTANEOUS | Status: DC | PRN
  Administered 2013-07-31: 15:00:00

## 2013-07-31 MED ORDER — ACETAMINOPHEN 650 MG RE SUPP: 650.0000 mg | RECTAL | Status: DC | PRN

## 2013-07-31 MED ORDER — FENTANYL CITRATE 0.05 MG/ML IJ SOLN
INTRAMUSCULAR | Status: DC | PRN
  Administered 2013-07-31: 100 ug via INTRAVENOUS
  Administered 2013-07-31: 50 ug via INTRAVENOUS
  Administered 2013-07-31: 150 ug via INTRAVENOUS
  Administered 2013-07-31: 25 ug via INTRAVENOUS

## 2013-07-31 MED ORDER — MORPHINE SULFATE 2 MG/ML IJ SOLN
1.0000 mg | INTRAMUSCULAR | Status: DC | PRN
  Administered 2013-08-01 (×2): 2 mg via INTRAVENOUS
  Filled 2013-07-31 (×2): qty 1

## 2013-07-31 MED ORDER — BISACODYL 5 MG PO TBEC: 5.0000 mg | DELAYED_RELEASE_TABLET | Freq: Every day | ORAL | Status: DC | PRN

## 2013-07-31 MED ORDER — ATORVASTATIN CALCIUM 20 MG PO TABS
20.0000 mg | ORAL_TABLET | Freq: Every day | ORAL | Status: DC
  Administered 2013-08-01 – 2013-08-02 (×2): 20 mg via ORAL
  Filled 2013-07-31 (×3): qty 1

## 2013-07-31 MED ORDER — SODIUM CHLORIDE 0.9 % IJ SOLN: 9.0000 mL | INTRAMUSCULAR | Status: DC | PRN

## 2013-07-31 MED ORDER — SODIUM CHLORIDE 0.9 % IV SOLN: 250.0000 mL | INTRAVENOUS | Status: DC

## 2013-07-31 MED ORDER — OXYCODONE HCL 5 MG PO TABS
5.0000 mg | ORAL_TABLET | Freq: Once | ORAL | Status: AC | PRN
  Administered 2013-07-31: 5 mg via ORAL

## 2013-07-31 MED ORDER — ZOLPIDEM TARTRATE 5 MG PO TABS
5.0000 mg | ORAL_TABLET | Freq: Every evening | ORAL | Status: DC | PRN
  Filled 2013-07-31: qty 1

## 2013-07-31 MED ORDER — HYDROMORPHONE HCL PF 1 MG/ML IJ SOLN
INTRAMUSCULAR | Status: DC | PRN
  Administered 2013-07-31 (×4): .25 mg via INTRAVENOUS

## 2013-07-31 MED ORDER — SITAGLIP PHOS-METFORMIN HCL ER 50-500 MG PO TB24: ORAL_TABLET | Freq: Two times a day (BID) | ORAL | Status: DC

## 2013-07-31 MED ORDER — MIDAZOLAM HCL 5 MG/5ML IJ SOLN
INTRAMUSCULAR | Status: DC | PRN
  Administered 2013-07-31: 2 mg via INTRAVENOUS

## 2013-07-31 MED ORDER — ALUM & MAG HYDROXIDE-SIMETH 200-200-20 MG/5ML PO SUSP: 30.0000 mL | Freq: Four times a day (QID) | ORAL | Status: DC | PRN

## 2013-07-31 MED ORDER — POVIDONE-IODINE 7.5 % EX SOLN
Freq: Once | CUTANEOUS | Status: DC
  Filled 2013-07-31: qty 118

## 2013-07-31 MED ORDER — MORPHINE SULFATE (PF) 1 MG/ML IV SOLN
INTRAVENOUS | Status: DC
  Administered 2013-07-31: 11.5 mg via INTRAVENOUS
  Administered 2013-07-31: 18:00:00 via INTRAVENOUS
  Administered 2013-08-01: 9 mg via INTRAVENOUS
  Administered 2013-08-01: 05:00:00 via INTRAVENOUS
  Administered 2013-08-01: 1.5 mg via INTRAVENOUS
  Filled 2013-07-31: qty 25

## 2013-07-31 MED ORDER — DIPHENHYDRAMINE HCL 12.5 MG/5ML PO ELIX: 12.5000 mg | ORAL_SOLUTION | Freq: Four times a day (QID) | ORAL | Status: DC | PRN

## 2013-07-31 MED ORDER — OXYCODONE HCL 5 MG/5ML PO SOLN: 5.0000 mg | Freq: Once | ORAL | Status: AC | PRN

## 2013-07-31 MED ORDER — BUPIVACAINE-EPINEPHRINE 0.25% -1:200000 IJ SOLN
INTRAMUSCULAR | Status: DC | PRN
  Administered 2013-07-31: 10 mL

## 2013-07-31 MED ORDER — SODIUM CHLORIDE 0.9 % IV SOLN
INTRAVENOUS | Status: DC
  Administered 2013-08-01: 07:00:00 via INTRAVENOUS

## 2013-07-31 MED ORDER — DIPHENHYDRAMINE HCL 50 MG/ML IJ SOLN: 12.5000 mg | Freq: Four times a day (QID) | INTRAMUSCULAR | Status: DC | PRN

## 2013-07-31 MED ORDER — SODIUM CHLORIDE 0.9 % IJ SOLN: 3.0000 mL | INTRAMUSCULAR | Status: DC | PRN

## 2013-07-31 MED ORDER — LIDOCAINE HCL (CARDIAC) 20 MG/ML IV SOLN
INTRAVENOUS | Status: DC | PRN
  Administered 2013-07-31: 100 mg via INTRAVENOUS

## 2013-07-31 MED ORDER — ONDANSETRON HCL 4 MG/2ML IJ SOLN: 4.0000 mg | Freq: Four times a day (QID) | INTRAMUSCULAR | Status: DC | PRN

## 2013-07-31 MED ORDER — DIAZEPAM 5 MG PO TABS
5.0000 mg | ORAL_TABLET | Freq: Four times a day (QID) | ORAL | Status: DC | PRN
  Administered 2013-07-31 – 2013-08-02 (×6): 5 mg via ORAL
  Filled 2013-07-31 (×5): qty 1

## 2013-07-31 MED ORDER — 0.9 % SODIUM CHLORIDE (POUR BTL) OPTIME
TOPICAL | Status: DC | PRN
  Administered 2013-07-31: 1000 mL

## 2013-07-31 MED ORDER — FLEET ENEMA 7-19 GM/118ML RE ENEM: 1.0000 | ENEMA | Freq: Once | RECTAL | Status: AC | PRN

## 2013-07-31 MED ORDER — SUCCINYLCHOLINE CHLORIDE 20 MG/ML IJ SOLN
INTRAMUSCULAR | Status: DC | PRN
  Administered 2013-07-31: 140 mg via INTRAVENOUS

## 2013-07-31 MED ORDER — CEFAZOLIN SODIUM 1-5 GM-% IV SOLN
1.0000 g | Freq: Three times a day (TID) | INTRAVENOUS | Status: AC
  Administered 2013-07-31 – 2013-08-01 (×2): 1 g via INTRAVENOUS
  Filled 2013-07-31 (×2): qty 50

## 2013-07-31 MED ORDER — OXYCODONE-ACETAMINOPHEN 5-325 MG PO TABS
1.0000 | ORAL_TABLET | ORAL | Status: DC | PRN
  Administered 2013-08-01 – 2013-08-03 (×8): 2 via ORAL
  Filled 2013-07-31 (×8): qty 2

## 2013-07-31 MED ORDER — SENNOSIDES-DOCUSATE SODIUM 8.6-50 MG PO TABS: 1.0000 | ORAL_TABLET | Freq: Every evening | ORAL | Status: DC | PRN

## 2013-07-31 MED ORDER — MENTHOL 3 MG MT LOZG: 1.0000 | LOZENGE | OROMUCOSAL | Status: DC | PRN

## 2013-07-31 MED ORDER — ACETAMINOPHEN 325 MG PO TABS
650.0000 mg | ORAL_TABLET | ORAL | Status: DC | PRN
  Administered 2013-08-02 (×2): 650 mg via ORAL
  Filled 2013-07-31 (×2): qty 2

## 2013-07-31 MED ORDER — PROPOFOL 10 MG/ML IV BOLUS
INTRAVENOUS | Status: DC | PRN
  Administered 2013-07-31: 80 mg via INTRAVENOUS
  Administered 2013-07-31: 120 mg via INTRAVENOUS

## 2013-07-31 MED ORDER — LACTATED RINGERS IV SOLN
INTRAVENOUS | Status: DC
  Administered 2013-07-31: 10:00:00 via INTRAVENOUS

## 2013-07-31 MED ORDER — LINAGLIPTIN 5 MG PO TABS
5.0000 mg | ORAL_TABLET | Freq: Two times a day (BID) | ORAL | Status: DC
  Administered 2013-07-31 – 2013-08-03 (×6): 5 mg via ORAL
  Filled 2013-07-31 (×9): qty 1

## 2013-07-31 MED ORDER — NALOXONE HCL 0.4 MG/ML IJ SOLN: 0.4000 mg | INTRAMUSCULAR | Status: DC | PRN

## 2013-07-31 MED ORDER — OXYCODONE HCL 5 MG PO TABS
ORAL_TABLET | ORAL | Status: AC
  Filled 2013-07-31: qty 2

## 2013-07-31 MED ORDER — DOCUSATE SODIUM 100 MG PO CAPS
100.0000 mg | ORAL_CAPSULE | Freq: Two times a day (BID) | ORAL | Status: DC
  Administered 2013-07-31 – 2013-08-03 (×6): 100 mg via ORAL
  Filled 2013-07-31 (×6): qty 1

## 2013-07-31 SURGICAL SUPPLY — 97 items
BENZOIN TINCTURE PRP APPL 2/3 (GAUZE/BANDAGES/DRESSINGS) ×3 IMPLANT
BLADE SURG 10 STRL SS (BLADE) ×3 IMPLANT
BLADE SURG ROTATE 9660 (MISCELLANEOUS) IMPLANT
BOLT DECADE 5.5X40 (Bolt) ×6 IMPLANT
BUR ROUND PRECISION 4.0 (BURR) ×3 IMPLANT
CARTRIDGE OIL MAESTRO DRILL (MISCELLANEOUS) ×4 IMPLANT
CLOTH BEACON ORANGE TIMEOUT ST (SAFETY) ×3 IMPLANT
CLSR STERI-STRIP ANTIMIC 1/2X4 (GAUZE/BANDAGES/DRESSINGS) ×3 IMPLANT
CONT SPEC STER OR (MISCELLANEOUS) ×3 IMPLANT
CORDS BIPOLAR (ELECTRODE) ×3 IMPLANT
COVER SURGICAL LIGHT HANDLE (MISCELLANEOUS) ×3 IMPLANT
DIFFUSER DRILL AIR PNEUMATIC (MISCELLANEOUS) ×6 IMPLANT
DRAIN CHANNEL 15F RND FF W/TCR (WOUND CARE) IMPLANT
DRAPE C-ARM 42X72 X-RAY (DRAPES) ×3 IMPLANT
DRAPE ORTHO SPLIT 77X108 STRL (DRAPES) ×1
DRAPE POUCH INSTRU U-SHP 10X18 (DRAPES) ×3 IMPLANT
DRAPE SURG 17X23 STRL (DRAPES) ×15 IMPLANT
DRAPE SURG ORHT 6 SPLT 77X108 (DRAPES) ×2 IMPLANT
DRAPE U-SHAPE 47X51 STRL (DRAPES) IMPLANT
DRSG MEPILEX BORDER 4X8 (GAUZE/BANDAGES/DRESSINGS) ×3 IMPLANT
DURAPREP 26ML APPLICATOR (WOUND CARE) ×3 IMPLANT
ELECT BLADE 4.0 EZ CLEAN MEGAD (MISCELLANEOUS) ×3
ELECT BLADE 6.5 EXT (BLADE) ×3 IMPLANT
ELECT CAUTERY BLADE 6.4 (BLADE) ×3 IMPLANT
ELECT REM PT RETURN 9FT ADLT (ELECTROSURGICAL) ×3
ELECTRODE BLDE 4.0 EZ CLN MEGD (MISCELLANEOUS) ×2 IMPLANT
ELECTRODE REM PT RTRN 9FT ADLT (ELECTROSURGICAL) ×2 IMPLANT
EVACUATOR SILICONE 100CC (DRAIN) IMPLANT
GAUZE SPONGE 4X4 16PLY XRAY LF (GAUZE/BANDAGES/DRESSINGS) ×3 IMPLANT
GLOVE BIO SURGEON STRL SZ7 (GLOVE) ×6 IMPLANT
GLOVE BIO SURGEON STRL SZ8 (GLOVE) ×3 IMPLANT
GLOVE BIOGEL PI IND STRL 7.0 (GLOVE) ×2 IMPLANT
GLOVE BIOGEL PI IND STRL 7.5 (GLOVE) ×2 IMPLANT
GLOVE BIOGEL PI IND STRL 8 (GLOVE) ×2 IMPLANT
GLOVE BIOGEL PI INDICATOR 7.0 (GLOVE) ×1
GLOVE BIOGEL PI INDICATOR 7.5 (GLOVE) ×1
GLOVE BIOGEL PI INDICATOR 8 (GLOVE) ×1
GOWN STRL NON-REIN LRG LVL3 (GOWN DISPOSABLE) ×6 IMPLANT
GOWN STRL REIN XL XLG (GOWN DISPOSABLE) ×6 IMPLANT
GUIDEWIRE SHARP VIPER II (WIRE) ×3 IMPLANT
IMPLANT COROENT XL 10X18X50 (Orthopedic Implant) ×3 IMPLANT
IV CATH 14GX2 1/4 (CATHETERS) ×3 IMPLANT
K-WIRE MAXCESS 4 13.5 (Wire) ×9 IMPLANT
KIT BASIN OR (CUSTOM PROCEDURE TRAY) ×3 IMPLANT
KIT DILATOR XLIF 5 (KITS) ×2 IMPLANT
KIT MAXCESS (KITS) ×3 IMPLANT
KIT NEEDLE NVM5 EMG ELECT (KITS) ×2 IMPLANT
KIT NEEDLE NVM5 EMG ELECTRODE (KITS) ×1
KIT POSITION SURG JACKSON T1 (MISCELLANEOUS) IMPLANT
KIT ROOM TURNOVER OR (KITS) ×3 IMPLANT
KIT XLIF (KITS) ×1
MARKER SKIN DUAL TIP RULER LAB (MISCELLANEOUS) ×3 IMPLANT
NEEDLE BONE MARROW 8GX6 FENEST (NEEDLE) IMPLANT
NEEDLE HYPO 25GX1X1/2 BEV (NEEDLE) ×3 IMPLANT
NEEDLE JAMSHIDI VIPER (NEEDLE) ×6 IMPLANT
NEEDLE SPNL 18GX3.5 QUINCKE PK (NEEDLE) IMPLANT
NS IRRIG 1000ML POUR BTL (IV SOLUTION) ×3 IMPLANT
NUVASIVE GUIDEWIRE ×9 IMPLANT
OIL CARTRIDGE MAESTRO DRILL (MISCELLANEOUS) ×6
PACK LAMINECTOMY ORTHO (CUSTOM PROCEDURE TRAY) ×3 IMPLANT
PACK UNIVERSAL I (CUSTOM PROCEDURE TRAY) ×3 IMPLANT
PAD ARMBOARD 7.5X6 YLW CONV (MISCELLANEOUS) ×9 IMPLANT
PATTIES SURGICAL .5 X1 (DISPOSABLE) ×3 IMPLANT
PATTIES SURGICAL .5X1.5 (GAUZE/BANDAGES/DRESSINGS) IMPLANT
PLATE 2H 10MM (Plate) ×3 IMPLANT
PUTTY BONE DBX 2.5 MIS (Bone Implant) ×3 IMPLANT
PUTTY BONE DBX 5CC MIX (Putty) ×3 IMPLANT
ROD VIPER II LORDOSED 5.5X40 (Rod) ×3 IMPLANT
SCREW SET SINGLE INNER MIS (Screw) ×6 IMPLANT
SCREW VIPER X-TAB 6.0X40 (Screw) ×6 IMPLANT
SPONGE GAUZE 4X4 12PLY (GAUZE/BANDAGES/DRESSINGS) ×3 IMPLANT
SPONGE INTESTINAL PEANUT (DISPOSABLE) ×6 IMPLANT
SPONGE LAP 4X18 X RAY DECT (DISPOSABLE) ×3 IMPLANT
SPONGE SURGIFOAM ABS GEL 100 (HEMOSTASIS) ×3 IMPLANT
STRIP CLOSURE SKIN 1/2X4 (GAUZE/BANDAGES/DRESSINGS) ×6 IMPLANT
SURGIFLO TRUKIT (HEMOSTASIS) IMPLANT
SUT MNCRL AB 4-0 PS2 18 (SUTURE) ×6 IMPLANT
SUT PDS AB 1 CTX 36 (SUTURE) IMPLANT
SUT PROLENE 5 0 C 1 24 (SUTURE) IMPLANT
SUT SILK 2 0 TIES 10X30 (SUTURE) IMPLANT
SUT VIC AB 0 CT1 18XCR BRD 8 (SUTURE) ×2 IMPLANT
SUT VIC AB 0 CT1 8-18 (SUTURE) ×1
SUT VIC AB 1 CT1 18XCR BRD 8 (SUTURE) ×2 IMPLANT
SUT VIC AB 1 CT1 8-18 (SUTURE) ×1
SUT VIC AB 2-0 CT2 18 VCP726D (SUTURE) ×3 IMPLANT
SYR 20CC LL (SYRINGE) ×3 IMPLANT
SYR BULB IRRIGATION 50ML (SYRINGE) ×3 IMPLANT
SYR CONTROL 10ML LL (SYRINGE) ×3 IMPLANT
SYR TB 1ML LUER SLIP (SYRINGE) IMPLANT
TAP CANN VIPER2 DL 5.0 (TAP) ×3 IMPLANT
TAPE CLOTH SURG 4X10 WHT LF (GAUZE/BANDAGES/DRESSINGS) ×3 IMPLANT
TOWEL OR 17X24 6PK STRL BLUE (TOWEL DISPOSABLE) ×3 IMPLANT
TOWEL OR 17X26 10 PK STRL BLUE (TOWEL DISPOSABLE) ×3 IMPLANT
TRAY FOLEY CATH 14FRSI W/METER (CATHETERS) IMPLANT
TRAY FOLEY CATH 16FRSI W/METER (SET/KITS/TRAYS/PACK) ×3 IMPLANT
WATER STERILE IRR 1000ML POUR (IV SOLUTION) IMPLANT
YANKAUER SUCT BULB TIP NO VENT (SUCTIONS) ×3 IMPLANT

## 2013-07-31 NOTE — Anesthesia Procedure Notes (Signed)
Procedure Name: Intubation Date/Time: 07/31/2013 1:40 PM Performed by: Whitman Hero Pre-anesthesia Checklist: Patient identified, Timeout performed, Emergency Drugs available, Suction available and Patient being monitored Patient Re-evaluated:Patient Re-evaluated prior to inductionOxygen Delivery Method: Circle system utilized Preoxygenation: Pre-oxygenation with 100% oxygen Intubation Type: IV induction Ventilation: Mask ventilation without difficulty Laryngoscope Size: Mac and 3 Grade View: Grade I Tube type: Oral Tube size: 7.5 mm Number of attempts: 2 Airway Equipment and Method: Stylet Secured at: 22 cm Tube secured with: Tape Dental Injury: Teeth and Oropharynx as per pre-operative assessment

## 2013-07-31 NOTE — Transfer of Care (Signed)
Immediate Anesthesia Transfer of Care Note  Patient: [name]  Procedure(s) Performed: Procedure(s) with comments: ANTERIOR LATERAL LUMBAR FUSION 1 LEVEL/Left sided lumbar 3-4 lateral interbody fusion with instrumentation and allograft. (Left) - Left sided lumbar 3-4 lateral interbody fusion with instrumentation and allograft. POSTERIOR LUMBAR FUSION 1 LEVEL/Lumbar 3-4 posterior spinal fusion with instrumentation. (N/A) - Lumbar 3-4 posterior spinal fusion with instrumentation.  Patient Location: PACU  Anesthesia Type:General  Level of Consciousness: awake, alert  and oriented  Airway & Oxygen Therapy: Patient Spontanous Breathing and Patient connected to face mask oxygen  Post-op Assessment: Report given to PACU RN, Post -op Vital signs reviewed and stable and Patient moving all extremities  Post vital signs: Reviewed and stable  Complications: No apparent anesthesia complications

## 2013-07-31 NOTE — H&P (Signed)
PREOPERATIVE H&P  Chief Complaint: left thigh pain  HPI: [name] is a 47 y.o. female who presents with ongoing pain in the left thigh. 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 Patient has failed conservative care and has continued to have pain. xrays reveal instability at L3/4.  Past Medical History  Diagnosis Date  . Diabetes mellitus without complication   . Hyperlipidemia   . Heart murmur     mild per patient  . GERD (gastroesophageal reflux disease)    Past Surgical History  Procedure Laterality Date  . Appendectomy  30- 40 years   History   Social History  . Marital Status: Married    Spouse Name: N/A    Number of Children: N/A  . Years of Education: N/A   Social History Main Topics  . Smoking status: Current Every Day Smoker -- 0.25 packs/day for 20 years  . Smokeless tobacco: Not on file     Comment: Was at 1 ppd  . Alcohol Use: Yes     Comment: beer everynow and then per patient  . Drug Use: No  . Sexual Activity: Not on file   Other Topics Concern  . Not on file   Social History Narrative  . No narrative on file   No family history on file. No Known Allergies Prior to Admission medications   Medication Sig Start Date End Date Taking? Authorizing Provider  albuterol (PROVENTIL HFA;VENTOLIN HFA) 108 (90 BASE) MCG/ACT inhaler Inhale 2 puffs into the lungs every 6 (six) hours as needed for wheezing.   Yes Historical Provider, MD  cyclobenzaprine (FLEXERIL) 10 MG tablet Take 10 mg by mouth 2 (two) times daily as needed for muscle spasms.   Yes Historical Provider, MD  HYDROcodone-acetaminophen (NORCO) 7.5-325 MG per tablet Take 1 tablet by mouth every 4 (four) hours as needed for pain.   Yes Historical Provider, MD  omeprazole (PRILOSEC) 40 MG capsule Take 40 mg by mouth daily.   Yes Historical Provider, MD  rosuvastatin (CRESTOR) 10 MG tablet Take 10 mg by mouth daily.   Yes Historical Provider, MD  SitaGLIPtin-MetFORMIN HCl (JANUMET XR) 50-500 MG  TB24 Take by mouth 2 (two) times daily.   Yes Historical Provider, MD     All other systems have been reviewed and were otherwise negative with the exception of those mentioned in the HPI and as above.  Physical Exam: There were no vitals filed for this visit.  General: Alert, no acute distress Cardiovascular: No pedal edema Respiratory: No cyanosis, no use of accessory musculature Skin: No lesions in the area of chief complaint Neurologic: Sensation intact distally Psychiatric: Patient is competent for consent with normal mood and affect Lymphatic: No axillary or cervical lymphadenopathy   Assessment/Plan: Left leg pain Plan for Procedure(s): ANTERIOR LATERAL LUMBAR FUSION 1 LEVEL/Left sided lumbar 3-4 lateral interbody fusion with instrumentation and allograft. POSTERIOR LUMBAR FUSION 1 LEVEL/Lumbar 3-4 posterior spinal fusion with instrumentation.   Emilee Hero, MD 07/31/2013 7:27 AM

## 2013-07-31 NOTE — Anesthesia Preprocedure Evaluation (Addendum)
Anesthesia Evaluation  Patient identified by MRN, date of birth, ID band Patient awake    Reviewed: Allergy & Precautions, H&P , NPO status , Patient's Chart, lab work & pertinent test results  Airway Mallampati: I TM Distance: >3 FB Neck ROM: Full    Dental   Pulmonary          Cardiovascular     Neuro/Psych    GI/Hepatic GERD-  Medicated and Controlled,  Endo/Other  diabetes, Type 2, Insulin Dependent  Renal/GU      Musculoskeletal   Abdominal   Peds  Hematology   Anesthesia Other Findings   Reproductive/Obstetrics                           Anesthesia Physical Anesthesia Plan  ASA: II  Anesthesia Plan: General   Post-op Pain Management:    Induction: Intravenous  Airway Management Planned: Oral ETT  Additional Equipment:   Intra-op Plan:   Post-operative Plan: Extubation in OR  Informed Consent: I have reviewed the patients History and Physical, chart, labs and discussed the procedure including the risks, benefits and alternatives for the proposed anesthesia with the patient or authorized representative who has indicated his/her understanding and acceptance.     Plan Discussed with: CRNA and Surgeon  Anesthesia Plan Comments:         Anesthesia Quick Evaluation

## 2013-07-31 NOTE — OR Nursing (Signed)
[name] has had an uneventful course in pacu, she is either asleep soundly or abrubtly awake , requesting help and having pain, then falling gently back to sleep. She has hasd sips of water and oral plus injectable pain meds with inability to rate pain...either asleep or not. She moves all extremities readily/strongly and assists with turning

## 2013-07-31 NOTE — Anesthesia Postprocedure Evaluation (Signed)
Anesthesia Post Note  Patient: [name]  Procedure(s) Performed: Procedure(s) (LRB): ANTERIOR LATERAL LUMBAR FUSION 1 LEVEL/Left sided lumbar 3-4 lateral interbody fusion with instrumentation and allograft. (Left) POSTERIOR LUMBAR FUSION 1 LEVEL/Lumbar 3-4 posterior spinal fusion with instrumentation. (N/A)  Anesthesia type: General  Patient location: PACU  Post pain: Pain level controlled and Adequate analgesia  Post assessment: Post-op Vital signs reviewed, Patient's Cardiovascular Status Stable, Respiratory Function Stable, Patent Airway and Pain level controlled  Last Vitals:  Filed Vitals:   07/31/13 1900  BP:   Pulse:   Temp:   Resp: 13    Post vital signs: Reviewed and stable  Level of consciousness: awake, alert  and oriented  Complications: No apparent anesthesia complications

## 2013-08-01 ENCOUNTER — Encounter (HOSPITAL_COMMUNITY): Payer: Self-pay

## 2013-08-01 LAB — GLUCOSE, CAPILLARY: Glucose-Capillary: 176 mg/dL — ABNORMAL HIGH (ref 70–99)

## 2013-08-01 MED ORDER — INFLUENZA VAC SPLIT QUAD 0.5 ML IM SUSP
0.5000 mL | INTRAMUSCULAR | Status: DC
  Filled 2013-08-01: qty 0.5

## 2013-08-01 MED ORDER — PNEUMOCOCCAL VAC POLYVALENT 25 MCG/0.5ML IJ INJ
0.5000 mL | INJECTION | INTRAMUSCULAR | Status: DC
  Filled 2013-08-01: qty 0.5

## 2013-08-01 NOTE — Evaluation (Signed)
Physical Therapy Evaluation Patient Details Name: [name] MRN: [id number] DOB: 05-19-1966 Today's Date: 08/01/2013 Time: 4098-1191 PT Time Calculation (min): 29 min  PT Assessment / Plan / Recommendation History of Present Illness   L3-4 Lateral interbody fusion with posterior instrumentation for spinal stenosis  Clinical Impression  This patient underwent a L3-4 level ALIF and presents to PT with anticipated post-op pain, decreased functional mobility and gait.  Pt. Will benefit from acute PT to address these and below issues.      PT Assessment  Patient needs continued PT services    Follow Up Recommendations  Home health PT;Supervision/Assistance - 24 hour    Does the patient have the potential to tolerate intense rehabilitation      Barriers to Discharge        Equipment Recommendations  Rolling walker with 5" wheels    Recommendations for Other Services     Frequency Min 5X/week    Precautions / Restrictions Precautions Precautions: Back Precaution Booklet Issued: Yes (comment) Required Braces or Orthoses: Spinal Brace Spinal Brace: Applied in sitting position;Thoracolumbosacral orthotic Restrictions Weight Bearing Restrictions: No   Pertinent Vitals/Pain Pain rated 10/10 in back, RN in room and pt. Took pain medications.      Mobility  Bed Mobility Bed Mobility: Rolling Right;Right Sidelying to Sit;Sitting - Scoot to [place] of Bed Rolling Right: 3: Mod assist;With rail Right Sidelying to Sit: 1: +2 Total assist;With rails;HOB elevated Right Sidelying to Sit: Patient Percentage: 60% Sitting - Scoot to Edge of Bed: 1: +1 Total assist Details for Bed Mobility Assistance: VCs for safe technique Transfers Transfers: Sit to Stand;Stand to Sit Sit to Stand: 1: +2 Total assist;With upper extremity assist;From bed Sit to Stand: Patient Percentage: 60% Stand to Sit: 1: +2 Total assist;With upper extremity assist;To chair/3-in-1 Stand to Sit: Patient Percentage:  60% Details for Transfer Assistance: VCs for safe hand placement; pt stood for about 5 minutes saying she could not sit down because it hurt so much--we finally had to hold her hands and let her sit down that way v. reaching back for the recliner Ambulation/Gait Ambulation/Gait Assistance: 4: Min assist Ambulation Distance (Feet): 5 Feet Assistive device: Rolling walker Gait Pattern: Decreased step length - right;Decreased step length - left;Shuffle Gait velocity: decreased Stairs: No Wheelchair Mobility Wheelchair Mobility: No    Exercises     PT Diagnosis: Difficulty walking;Abnormality of gait;Generalized weakness;Acute pain  PT Problem List: Decreased strength;Decreased activity tolerance;Decreased balance;Decreased mobility;Decreased knowledge of use of DME;Decreased safety awareness;Decreased knowledge of precautions;Pain PT Treatment Interventions: DME instruction;Gait training;Stair training;Functional mobility training;Therapeutic activities;Therapeutic exercise;Balance training;Patient/family education     PT Goals(Current goals can be found in the care plan section) Acute Rehab PT Goals Patient Stated Goal: to move and go home PT Goal Formulation: With patient/family Time For Goal Achievement: 08/08/13 Potential to Achieve Goals: Good Additional Goals Additional Goal #1: Pt. will verbalize 3/3 back precautions  Visit Information  Last PT Received On: 08/01/13 Assistance Needed: +2 PT/OT Co-Evaluation/Treatment: Yes History of Present Illness:  L3-4 Lateral interbody fusion with posterior instrumentation for spinal stenosis       Prior Functioning  Home Living Family/patient expects to be discharged to:: Private residence Living Arrangements: Spouse/significant other Available Help at Discharge: Family;Available 24 hours/day Type of Home: House Home Access: Stairs to enter [employer] of Steps: 2 Entrance Stairs-Rails: None Home Layout: One  level Home Equipment: None Prior Function Level of Independence: Independent Communication Communication: No difficulties Dominant Hand: Right    Cognition  Cognition Arousal/Alertness: Awake/alert Behavior During Therapy: WFL for tasks assessed/performed Overall Cognitive Status: Within Functional Limits for tasks assessed    Extremity/Trunk Assessment Upper Extremity Assessment Upper Extremity Assessment: Defer to OT evaluation Lower Extremity Assessment Lower Extremity Assessment: Generalized weakness   Balance Balance Balance Assessed: Yes Static Sitting Balance Static Sitting - Balance Support: Bilateral upper extremity supported;Feet supported Static Sitting - Level of Assistance: 5: Stand by assistance Static Sitting - Comment/# of Minutes: 5  End of Session PT - End of Session Equipment Utilized During Treatment: Gait belt;Back brace Activity Tolerance: Patient tolerated treatment well;Patient limited by pain Patient left: in chair;with call bell/phone within reach;with family/visitor present Nurse Communication: Mobility status  GP     Moshe Cipro K 08/01/2013, 12:26 PM  Clarita Crane, PT, DPT [phone]

## 2013-08-01 NOTE — Plan of Care (Signed)
Problem: Consults Goal: Diagnosis - Spinal Surgery Outcome: Completed/Met Date Met:  08/01/13 Thoraco/Lumbar Spine Fusion--see OP note  Problem: Phase I Progression Outcomes Goal: Initial discharge plan identified Outcome: Completed/Met Date Met:  08/01/13 Plan for discharge is home with husband when medically ready.

## 2013-08-01 NOTE — Progress Notes (Signed)
Pt 1 Day PO L3-4 Lateral interbody fusion with posterior instrumentation for spinal stenosis. She reports she is doing well and has resolution of her leg pain. She has not yet been out of bed but is very eager to get up and work with PT/OT. She does have the expected post-operative LBP but it is well controlled. She is without complaint otherwise.  BP 107/69  Pulse 100  Temp(Src) 98.8 F (37.1 C) (Oral)  Resp 16  Ht 5\' 3"  (1.6 m)  Wt 82.555 kg (182 lb)  BMI 32.25 kg/m2  SpO2 100%  Pt is laying in hospital bed comfortably eating breakfast. L lateral and posterior dressings C/D/I. NVI, 5/5 DFLX, PFLX, -Homans, 2+ DPP, SCD's in place, TLSO at bedside.  1 Day PO L3-4 Left lateral interbody fusion with unilateral posterior instrumentation for spinal stenosis  -D/C PCA  -Percocet/Valium for pain, morphine for breakthrough  -Up with PT/OT today   -TLSO brace at all times when OOB for 6 weeks   -Back Precautions at all times  -D/C Foley  -Cont SCD's   -Likely D/C home Sunday pending pain control and PT/OT   -Written scripts and D/C instructions printed in chart   -Dr Janee Morn to follow over the weekend

## 2013-08-01 NOTE — Evaluation (Signed)
Occupational Therapy Evaluation Patient Details Name: [name] MRN: [id number] DOB: 1966/06/08 Today's Date: 08/01/2013 Time: 7829-5621 OT Time Calculation (min): 34 min  OT Assessment / Plan / Recommendation History of present illness  L3-4 Lateral interbody fusion with posterior instrumentation for spinal stenosis   Clinical Impression   This 47 yo female admitted and underwent above presents to acute OT with deficits below. Will benefit from acute OT without need for follow up .    OT Assessment  Patient needs continued OT Services    Follow Up Recommendations  No OT follow up       Equipment Recommendations  3 in 1 bedside comode;Tub/shower bench (pt Medicaid--will cover tub equipment)       Frequency  Min 2X/week    Precautions / Restrictions Precautions Precautions: Back Precaution Booklet Issued: Yes (comment) Required Braces or Orthoses: Spinal Brace Spinal Brace: Applied in sitting position;Thoracolumbosacral orthotic Restrictions Weight Bearing Restrictions: No   Pertinent Vitals/Pain 8/10 back pain; PCA and RN in to give pain meds    ADL  Eating/Feeding: Independent Where Assessed - Eating/Feeding: Chair Grooming: Set up Where Assessed - Grooming: Supported sitting Upper Body Bathing: Moderate assistance Where Assessed - Upper Body Bathing: Supported sitting Lower Body Bathing: +1 Total assistance Where Assessed - Lower Body Bathing: Supported sit to stand Upper Body Dressing: +1 Total assistance Where Assessed - Upper Body Dressing: Unsupported sitting Lower Body Dressing: +1 Total assistance Where Assessed - Lower Body Dressing: Supported sit to Pharmacist, hospital: +2 Total assistance Toilet Transfer: Patient Percentage: 60% Statistician Method: Sit to Barista:  (Bed> 5 feet forward>sit in recliner behind her) Therapist, nutritional and Hygiene: +1 Total assistance Where Assessed - Toileting Clothing  Manipulation and Hygiene: Sit to stand from 3-in-1 or toilet Equipment Used: Rolling walker;Gait belt;Back brace Transfers/Ambulation Related to ADLs: total A +2 (pt=60%) sit<>stand    OT Diagnosis: Generalized weakness;Acute pain  OT Problem List: Decreased strength;Decreased range of motion;Decreased activity tolerance;Impaired balance (sitting and/or standing);Pain;Obesity;Decreased knowledge of use of DME or AE OT Treatment Interventions: Self-care/ADL training;Balance training;DME and/or AE instruction;Patient/family education   OT Goals(Current goals can be found in the care plan section) Acute Rehab OT Goals OT Goal Formulation: With patient Time For Goal Achievement: 08/08/13 Potential to Achieve Goals: Good  Visit Information  Last OT Received On: 08/01/13 Assistance Needed: +2 PT/OT Co-Evaluation/Treatment: Yes History of Present Illness:  L3-4 Lateral interbody fusion with posterior instrumentation for spinal stenosis       Prior Functioning     Home Living Family/patient expects to be discharged to:: Private residence Living Arrangements: Spouse/significant other Available Help at Discharge: Family;Available 24 hours/day Type of Home: House Home Access: Stairs to enter [employer] of Steps: 2 Entrance Stairs-Rails: None Home Layout: One level Home Equipment: None Prior Function Level of Independence: Independent Communication Communication: No difficulties Dominant Hand: Right         Vision/Perception Vision - History Patient Visual Report: No change from baseline   Cognition  Cognition Arousal/Alertness: Awake/alert Behavior During Therapy: WFL for tasks assessed/performed Overall Cognitive Status: Within Functional Limits for tasks assessed    Extremity/Trunk Assessment Upper Extremity Assessment Upper Extremity Assessment: Overall WFL for tasks assessed     Mobility Bed Mobility Bed Mobility: Rolling Right;Right Sidelying to  Sit;Sitting - Scoot to Edge of Bed Rolling Right: 3: Mod assist;With rail Right Sidelying to Sit: 1: +2 Total assist;With rails;HOB elevated Right Sidelying to Sit: Patient Percentage: 60% Sitting -  Scoot to [place] of Bed: 1: +1 Total assist Transfers Transfers: Sit to Stand;Stand to Sit Sit to Stand: 1: +2 Total assist;With upper extremity assist;From bed Sit to Stand: Patient Percentage: 60% Stand to Sit: 1: +2 Total assist;With upper extremity assist;To chair/3-in-1 Stand to Sit: Patient Percentage: 60% Details for Transfer Assistance: VCs for safe hand placement; pt stood for about 5 minutes saying she could not sit down because it hurt so much--we finally had to hold her hands and let her sit down that way v. reaching back for the recliner           End of Session OT - End of Session Equipment Utilized During Treatment: Gait belt;Rolling walker;Back brace Activity Tolerance: Patient limited by pain Patient left: in chair;with call bell/phone within reach;with family/visitor present Nurse Communication: Mobility status       Evette Georges [phone] 08/01/2013, 11:33 AM

## 2013-08-01 NOTE — Op Note (Signed)
**Note Amanda via Obfuscation** Brock, [name] NO.:  [id number]  MEDICAL RECORD NO.:  [id number]  LOCATION:  [id number]                        FACILITY:  [facility]  PHYSICIAN:  Estill Bamberg, MD      DATE OF BIRTH:  [birth year]  DATE OF PROCEDURE:  07/31/2013                              OPERATIVE REPORT   PREOPERATIVE DIAGNOSES: 1. L3-4 spinal stenosis. 2. L3-4 spondylolisthesis.  POSTOPERATIVE DIAGNOSES: 1. L3-4 spinal stenosis. 2. L3-4 spondylolisthesis.  PROCEDURE: 1. An anterior lumbar interbody fusion, L3-4, via a lateral approach. 2. Placement of anterior instrumentation, L3-4. 3. Insertion of interbody device x1 (10 mm x 18 mm x 50 mm NuVasive     interbody cage). 4. Use of morselized allograft (DBX mix). 5. Placement of posterior instrumentation L3-L4, (6 x 40 mm screws). 6. Posterior spinal fusion, L3-4. 7. Intraoperative use of fluoroscopy.  SURGEON:  Estill Bamberg, MD  ASSISTANT:  Jason Coop, [facility]  ANESTHESIA:  General endotracheal anesthesia.  COMPLICATIONS:  None.  DISPOSITION:  Stable.  ESTIMATED BLOOD LOSS:  Minimal.  INDICATIONS FOR PROCEDURE:  Briefly, Ms, Doby is a pleasant 47 year old female who did present to me with severe pain in her left leg.  I did review an MRI which was notable for L3-4 stenosis as well as a grade 1 L3-4 spondylolisthesis.  The patient did get temporary relief with a left-sided L3 nerve block.  She did fail other forms of conservative care and did continue to have pain.  Her pain has been present for about 1 year.  Given her ongoing pain, we did discuss proceeding with the procedure reflected above.  The patient did fully understand the risks and limitations of the procedure as outlined in my preoperative note.  OPERATIVE DETAILS:  On July 31, 2013, the patient was brought to surgery and general endotracheal anesthesia was administered.  The patient was placed in the lateral decubitus position with the left side up.  The  patient's chest and legs were secured to the bed.  All bony prominences were meticulously padded, including the region of the ulnar nerves as well as the common peroneal nerves.  The bed was then flexed, to help optimize the exposure of the L3-4 intervertebral space.  The left flank and low back were prepped and draped in the usual sterile fashion and a time-out procedure was performed.  I did liberally use AP and lateral fluoroscopy.  I then made a left-sided transverse incision over the left flank.  The external and internal oblique musculature was bluntly dissected away.  The transversalis fascia was then identified and entered.  The retroperitoneal space was noted and was bluntly swept anteriorly.  The psoas musculature was readily identified.  The psoas musculature was then entered using an initial dilator.  I did use lateral fluoroscopy to help dock the initial dilator over the posterior 1/3 of the L3-4 intervertebral space.  I did use neurologic monitoring while advancing the initial dilator to ensure that there was no neurologic structures in the direct vicinity of the dilator.  A second and third dilator was advanced over the first.  Again, I did use triggered EMG to test each of the dilators and it did appear that  the dilator to be safely docked to this particular location.  I.  I then placed a self-retaining retractor which was secured to the bed.  I then confirmed appropriate position of the retractor using AP and lateral fluoroscopy.  Once this was done, I placed a posterior shim through the posterior blade to help doc into the L3-4 intervertebral space.  I then retracted the dilator anteriorly.  I then used a knife to perform an annulotomy.  I then used a series of curettes to perform a thorough and complete L3-4 diskectomy.  A contralateral annular release was performed using a comp.  Once the diskectomy was complete, the endplates were prepared.  I then placed a series of  trials and I did feel that a 10 mm intervertebral spacer would be the most appropriate fit.  Interbody spacer was then impacted with DBX mix and tamped into the intervertebral space in the usual fashion.  I was extremely pleased with the press fit of the implant.  I then placed a lateral lumbar plate over the lateral aspect of the lumbar spine.  I did use an awl to prepare the trajectory of the L3 and L4 screws.  I then placed 6 x 40 mm screw to the L3 vertebral body, and then I placed a 6 x 45 mm screw to the L4 vertebral body.  The screws were then locked to the plate.  The bed was then leveled out and the plate was also locked.  I then copiously irrigated the wound.  I was extremely pleased at the AP and lateral fluoroscopic images.  The dilator was then removed.  Then, I did explore the wound for any undue bleeding and none was encountered.  I then closed the fascia using #1 Vicryl, the subcutaneous layer was closed using 2-0 Vicryl, and the skin was closed using 3-0 Monocryl.  Benzoin and Steri- Strips were applied.  I then turned my attention towards the patient's left low back.  The pedicles of L3 and L4 were demarcated using AP fluoroscopy.  I then made an incision lateral to the lateral border of the pedicles.  Through this incision, I did identify the transverse processes of L3 and L4.  The posterolateral gutter was created and the transverse processes were decorticated using a high-speed bur, and DBX mix was packed into the posterolateral gutter.  I then advanced Jamshidi needles through the L3 and L4 pedicles using AP and lateral fluoroscopy. A guidewire was advanced through the Jamshidi needles and a 5-mm tap was used to prepare the trajectory of the pedicle screws.  A 6 x 40 mm screw was placed in L3 and at L4.  An appropriate sized rod was then secured into the screws and caps were placed and a final locking procedure was performed.  I was very pleased with the appearance of  the construct on both the AP and lateral fluoroscopic images.  The wound was then copiously irrigated.  The fascia was then closed using #1 Vicryl.  The subcutaneous layer was closed using 2-0 Vicryl.  The skin was closed using 3-0 Monocryl.  Benzoin and Steri-Strips were applied followed by sterile dressing.  All instrument counts were correct at the termination of the procedure.  Of note, Jason Coop, was my assistant throughout the entirety of the procedure, and did aid in essential retraction and suctioning needed throughout the entirety of the surgery.     Estill Bamberg, MD    MD/MEDQ  D:  07/31/2013  T:  08/01/2013  Job:  [id number]

## 2013-08-02 LAB — GLUCOSE, CAPILLARY
Glucose-Capillary: 151 mg/dL — ABNORMAL HIGH (ref 70–99)
Glucose-Capillary: 203 mg/dL — ABNORMAL HIGH (ref 70–99)

## 2013-08-02 NOTE — Progress Notes (Signed)
Moved PNA and flu vaccination to tomorrow d/t pt has had low grade temp >24hrs.

## 2013-08-02 NOTE — Progress Notes (Signed)
Occupational Therapy Treatment Patient Details Name: [name] MRN: [id number] DOB: Feb 13, 1966 Today's Date: 08/02/2013 Time: 4098-1191 OT Time Calculation (min): 25 min  OT Assessment / Plan / Recommendation  History of present illness  L3-4 Lateral interbody fusion with posterior instrumentation for spinal stenosis   OT comments  This 47 yo female making progress and family being educated on how to A pt.  Follow Up Recommendations  No OT follow up       Equipment Recommendations  3 in 1 bedside comode;Tub/shower bench (pt is Medicaid, will cover tub bench)       Frequency Min 2X/week   Progress towards OT Goals Progress towards OT goals: Progressing toward goals  Plan Discharge plan remains appropriate    Precautions / Restrictions Precautions Precautions: Back Required Braces or Orthoses: Spinal Brace Spinal Brace: Thoracolumbosacral orthotic;Applied in sitting position Restrictions Weight Bearing Restrictions: No   Pertinent Vitals/Pain 9/10 headache; RN made aware and in to give pt meds    ADL  Tub/Shower Transfer: Minimal assistance (for Bil LEs, family A'd) Tub/Shower Transfer Method: Stand pivot Psychologist, educational: Counsellor Used: Rolling walker;Gait belt;Back brace Transfers/Ambulation Related to ADLs: Min A for all withRW ADL Comments: Had family doff brace      OT Goals(current goals can now be found in the care plan section)    Visit Information  Last OT Received On: 08/02/13 Assistance Needed: +1 History of Present Illness:  L3-4 Lateral interbody fusion with posterior instrumentation for spinal stenosis          Cognition  Cognition Arousal/Alertness: Awake/alert Behavior During Therapy: WFL for tasks assessed/performed Overall Cognitive Status: Within Functional Limits for tasks assessed    Mobility  Bed Mobility Bed Mobility: Sit to Sidelying Left Sit to Sidelying Left: 3: Mod assist (for Bil LEs) Details  for Bed Mobility Assistance: family A's pt with this Transfers Transfers: Sit to Stand;Stand to Sit Sit to Stand: 4: Min assist;With upper extremity assist;With armrests;From chair/3-in-1 Stand to Sit: 4: Min assist;With upper extremity assist;To bed Details for Transfer Assistance: VCs for safe hand placement          End of Session OT - End of Session Equipment Utilized During Treatment: Gait belt;Rolling walker;Back brace Activity Tolerance: Patient tolerated treatment well Patient left: in bed;with call bell/phone within reach;with family/visitor present Nurse Communication: Patient requests pain meds       Evette Georges [phone] 08/02/2013, 4:28 PM

## 2013-08-02 NOTE — Progress Notes (Signed)
Pt 2 Day PO L3-4 Lateral interbody fusion with posterior instrumentation for spinal stenosis. She reports she is doing well and has resolution of her leg pain. + PO, + flatus , + voiding  BP 103/46  Pulse 105  Temp(Src) 100 F (37.8 C) (Oral)  Resp 18  Ht 5\' 3"  (1.6 m)  Wt 82.555 kg (182 lb)  BMI 32.25 kg/m2  SpO2 93%  Pt is laying in hospital bed. L lateral and posterior dressings C/D/I. NVI, 5/5 DFLX, PFLX, -Homans, 2+ DPP, SCD's in place, TLSO at bedside.  1 Day PO 2 L3-4 Left lateral interbody fusion with unilateral posterior instrumentation for spinal stenosis  -saline lock IV  -Percocet/Valium for pain, morphine for breakthrough  -Up with PT/OT today   -TLSO brace at all times when OOB for 6 weeks   -Back Precautions at all times  -Cont SCD's   -Likely D/C home Sunday pending pain control and PT/OT   -Written scripts and D/C instructions printed in chart    -home DME ordered

## 2013-08-02 NOTE — Progress Notes (Signed)
SATURATION QUALIFICATIONS: (This note is used to comply with regulatory documentation for home oxygen)  Patient Saturations on Room Air at Rest = 91%  Patient Saturations on Room Air while Ambulating = 85%  Patient Saturations on 2 Liters of oxygen while Ambulating = 92%  Please briefly explain why patient needs home oxygen:Pt desat to 85% on RA with ambulation.  Currently needing O2 with activity to keep sats >90%. Thanks. [facility] Acute Rehabilitation [phone] (pager)

## 2013-08-02 NOTE — Progress Notes (Signed)
Physical Therapy Treatment Patient Details Name: [name] MRN: [id number] DOB: 09/13/1966 Today's Date: 08/02/2013 Time: 4098-1191 PT Time Calculation (min): 24 min  PT Assessment / Plan / Recommendation  History of Present Illness  L3-4 Lateral interbody fusion with posterior instrumentation for spinal stenosis   PT Comments   Pt admitted with above. Pt currently with functional limitations due to pain as well as balance and endurance deficits.  Desat with activity on RA today as well.  Pt will benefit from skilled PT to increase their independence and safety with mobility to allow discharge to the venue listed below.  Follow Up Recommendations  Home health PT;Supervision/Assistance - 24 hour                 Equipment Recommendations  Rolling walker with 5" wheels        Frequency Min 5X/week   Progress towards PT Goals Progress towards PT goals: Progressing toward goals  Plan Current plan remains appropriate    Precautions / Restrictions Precautions Precautions: Back Required Braces or Orthoses: Spinal Brace Spinal Brace: Applied in sitting position;Thoracolumbosacral orthotic Restrictions Weight Bearing Restrictions: No   Pertinent Vitals/Pain Desat with activity on RA to 85%; replaced O2 at 2L and O2 to 92% and >.  Some back pain but "not bad" per pt.    Mobility  Bed Mobility Bed Mobility: Rolling Left;Left Sidelying to Sit;Sitting - Scoot to Edge of Bed Rolling Right: Not tested (comment) Rolling Left: 4: Min guard;With rail Right Sidelying to Sit: Not tested (comment) Left Sidelying to Sit: 4: Min guard;HOB flat;With rails Sitting - Scoot to Edge of Bed: 5: Supervision Details for Bed Mobility Assistance: verbal cues for safe technique/for log roll.  Pt able to demonstrate with cues only.  Total assist to don brace. Transfers Transfers: Sit to Stand;Stand to Sit Sit to Stand: 4: Min assist;With upper extremity assist;From bed Stand to Sit: 4: Min  assist;With upper extremity assist;To chair/3-in-1;With armrests Details for Transfer Assistance: verbal cues for safe hand placement.  Pt needed cues for anterior weight shift to stand.  Pt took incr time to stand fully upright.  Needed assist to control descent onto 3N1 and chair.   Ambulation/Gait Ambulation/Gait Assistance: 4: Min assist Ambulation Distance (Feet): 145 Feet Assistive device: Rolling walker Ambulation/Gait Assistance Details: Pt ambulated with sequencing for steps and RW.  No LOB but did require assist to steer RW and cues for sequencing.  Desat to 85% on RA with ambulation; replaced O2 at 2 L with sat 92% and >.   Gait Pattern: Decreased step length - right;Decreased step length - left;Shuffle Gait velocity: decreased Stairs: No Wheelchair Mobility Wheelchair Mobility: No     PT Goals (current goals can now be found in the care plan section)    Visit Information  Last PT Received On: 08/02/13 Assistance Needed: +2 (for chair follow) History of Present Illness:  L3-4 Lateral interbody fusion with posterior instrumentation for spinal stenosis    Subjective Data  Subjective: "I feel better and want to get up."  "I love this brace."   Cognition  Cognition Arousal/Alertness: Awake/alert Behavior During Therapy: WFL for tasks assessed/performed Overall Cognitive Status: Within Functional Limits for tasks assessed    Balance  Static Sitting Balance Static Sitting - Balance Support: Bilateral upper extremity supported;Feet supported Static Sitting - Level of Assistance: 5: Stand by assistance Static Sitting - Comment/# of Minutes: 4  End of Session PT - End of Session Equipment Utilized During Treatment: Gait belt;Back brace;Oxygen  Activity Tolerance: Patient tolerated treatment well;Patient limited by pain Patient left: in chair;with call bell/phone within reach Nurse Communication: Mobility status       INGOLD,Jennavieve Arrick 08/02/2013, 11:13 AM  [facility] [phone] (pager)

## 2013-08-03 LAB — GLUCOSE, CAPILLARY: Glucose-Capillary: 158 mg/dL — ABNORMAL HIGH (ref 70–99)

## 2013-08-03 MED ORDER — HYDROCODONE-ACETAMINOPHEN 7.5-325 MG PO TABS: 1.0000 | ORAL_TABLET | ORAL | Status: DC | PRN

## 2013-08-03 MED ORDER — CYCLOBENZAPRINE HCL 10 MG PO TABS: 10.0000 mg | ORAL_TABLET | Freq: Two times a day (BID) | ORAL | Status: DC | PRN

## 2013-08-03 NOTE — Progress Notes (Signed)
Patient has refused PNA and Flu vaccination d/t low grade fever <24hrs.  Patient stated "I'll get it done when I have my follow up appt"

## 2013-08-03 NOTE — Progress Notes (Signed)
Pt 3 Day PO L3-4 Lateral interbody fusion with posterior instrumentation for spinal stenosis. She reports she is doing well and has resolution of her leg pain. + PO, + flatus , + voiding  Feels much better today than yday  BP 114/68  Pulse 69  Temp(Src) 97.3 F (36.3 C) (Oral)  Resp 16  Ht 5\' 3"  (1.6 m)  Wt 82.555 kg (182 lb)  BMI 32.25 kg/m2  SpO2 96%  Pt is laying in hospital bed. L lateral and posterior dressings C/D/I. NVI, 5/5 DFLX, PFLX, -Homans, 2+ DPP, SCD's in place, TLSO at bedside.  POD 3 L3-4 Left lateral interbody fusion with unilateral posterior instrumentation for spinal stenosis  D/C home today   -Written scripts and D/C instructions printed in chart    -home DME ordered

## 2013-08-03 NOTE — Progress Notes (Signed)
   CARE MANAGEMENT NOTE 08/03/2013  Patient:  [name], [name]   Account Number:  [id number]  Date Initiated:  08/01/2013  Documentation initiated by:  [facility]  Subjective/Objective Assessment:   admitted postop ALIF L3-4     Action/Plan:   PT/OT evals-   Anticipated DC Date:  08/03/2013   Anticipated DC Plan:  HOME W HOME HEALTH SERVICES      DC Planning Services  CM consult      [facility] Choice  HOME HEALTH   Choice offered to / List presented to:     DME arranged  3-N-1  SHOWER STOOL  WALKER - ROLLING      DME agency  [facility]     HH arranged  HH-2 PT      [facility] agency  [facility]   Status of service:  Completed, signed off Medicare Important Message given?   (If response is "NO", the following Medicare IM given date fields will be blank) Date Medicare IM given:   Date Additional Medicare IM given:    Discharge Disposition:  HOME/SELF CARE  Per UR Regulation:    If discussed at Long Length of Stay Meetings, dates discussed:    Comments:  08/03/13 RN called to arrange HHPT however, pt refuses. DME will be delivered to room prior to discharge.  Husband called CM multiple times in a 10 minute time frame to state they cannot wait for DME because their ride is here.  CM called [facility] to see if delivery to the home address was possible and [facility] states it is NOT possible.  I conveyed this to the pt and they state they will leave without it. I encouraged them to wait as the DME was aware, called twice, and was on the way to the room.  No other CM needs were communicated.  Freddy Jaksch, BSN, [facility] [phone].  08/01/13 Spoke with patient about d/c plans, her husband will be able to assist her after d/c. Explained that PT recommended HHPT but that her insurance does not cover HHPT for her dx. She does not wish to pay for HHPT out of pocket. She is agreeable to getting a rolling walker, 3N1 and tub bench. Requesting orders for DME. Jacquelynn Cree RN, BSN, CCM

## 2013-08-03 NOTE — Progress Notes (Signed)
Occupational Therapy Treatment Patient Details Name: [name] MRN: [id number] DOB: [birth year] Today's Date: 08/03/2013 Time: 9147-8295 OT Time Calculation (min): 24 min  OT Assessment / Plan / Recommendation  History of present illness  L3-4 Lateral interbody fusion with posterior instrumentation for spinal stenosis   OT comments  Practiced LB dressing with AE, toileting, and grooming at sink. Pt also practiced bed mobility. Pt moving well during session.   Follow Up Recommendations  No OT follow up;Supervision/Assistance - 24 hour    Barriers to Discharge       Equipment Recommendations  3 in 1 bedside comode;Tub/shower bench;Other (comment) (pt is Medicaid, will cover tub bench)    Recommendations for Other Services    Frequency Min 2X/week   Progress towards OT Goals Progress towards OT goals: Progressing toward goals  Plan Discharge plan needs to be updated    Precautions / Restrictions Precautions Precautions: Back Precaution Booklet Issued: No Precaution Comments: Reviewed precautions with pt Required Braces or Orthoses: Spinal Brace Spinal Brace: Thoracolumbosacral orthotic;Applied in sitting position Restrictions Weight Bearing Restrictions: No   Pertinent Vitals/Pain Pain 8/10. Repositioned.    ADL  Grooming: Set up;Supervision/safety;Teeth care;Wash/dry face Where Assessed - Grooming: Supported standing Upper Body Dressing: Other (comment);Maximal assistance (see ADL comment for back brace) Where Assessed - Upper Body Dressing: Unsupported sitting Lower Body Dressing: Min guard Where Assessed - Lower Body Dressing: Supported sit to stand Toilet Transfer: Hydrographic surveyor Method: Sit to Barista: Raised toilet seat with arms (or 3-in-1 over toilet) Toileting - Clothing Manipulation and Hygiene: Min guard Where Assessed - Toileting Clothing Manipulation and Hygiene: Sit to stand from 3-in-1 or toilet Equipment Used: Back  brace;Gait belt;Rolling walker;Reacher;Long-handled sponge;Long-handled shoe horn;Sock aid Transfers/Ambulation Related to ADLs: Min guard ADL Comments: OT assisted with donning brace and pt feels she can tell family how to assist. Pt did assist with straps and able to unfasten it when doffing.  Educated on placing grooming items on right side of sink and use of two cups for teeth care to avoid breaking precautions. Educated to stand in front of chair/bed with walker in front when pulling up LB clothing. Spoke with pt about having family pick up rugs in house.  Pt practiced donning pants with reacher and practiced socks with reacher/sockaid.    OT Diagnosis:    OT Problem List:   OT Treatment Interventions:     OT Goals(current goals can now be found in the care plan section) Acute Rehab OT Goals Patient Stated Goal: go home OT Goal Formulation: With patient Time For Goal Achievement: 08/08/13 Potential to Achieve Goals: Good ADL Goals Pt Will Perform Lower Body Dressing: with set-up;with supervision;sit to/from stand Pt Will Transfer to Toilet: with modified independence;ambulating (3 in 1 over commode) Pt Will Perform Toileting - Clothing Manipulation and hygiene: with modified independence;sit to/from stand Additional ADL Goal #1: Pt will be able to state 3/3 back precautions Additional ADL Goal #2: Pt will be able roll and come up to sit with Min A in prep for BADLs and transfers Additional ADL Goal #3: Pt's family will be independent in donning and doffing TLSO at EOB  Visit Information  Last OT Received On: 08/03/13 Assistance Needed: +1 History of Present Illness:  L3-4 Lateral interbody fusion with posterior instrumentation for spinal stenosis    Subjective Data      Prior Functioning       Cognition  Cognition Arousal/Alertness: Awake/alert Behavior During Therapy: [facility] for tasks assessed/performed  Overall Cognitive Status: Within Functional Limits for tasks assessed     Mobility  Bed Mobility Bed Mobility: Sit to Sidelying Left;Left Sidelying to Sit;Rolling Left;Rolling Right Rolling Right: 5: Supervision Rolling Left: 5: Supervision Left Sidelying to Sit: 4: Min guard Sit to Sidelying Left: 4: Min guard Details for Bed Mobility Assistance: cues for technique Transfers Transfers: Sit to Stand;Stand to Sit Sit to Stand: 4: Min guard;With upper extremity assist;From chair/3-in-1 Stand to Sit: 4: Min guard;With upper extremity assist;To bed;To chair/3-in-1 Details for Transfer Assistance: Min guard for safety.  Cues for hand placement.    Exercises      Balance     End of Session OT - End of Session Equipment Utilized During Treatment: Gait belt;Rolling walker;Back brace Activity Tolerance: Patient tolerated treatment well Patient left: in bed;with call bell/phone within reach  GO     Earlie Raveling OTR/L [phone] 08/03/2013, 11:52 AM

## 2013-08-05 ENCOUNTER — Encounter (HOSPITAL_COMMUNITY): Payer: Self-pay | Admitting: Orthopedic Surgery

## 2013-08-06 NOTE — Discharge Summary (Signed)
Patient ID: [name] MRN: [id number] DOB/AGE: September 13, 1966 47 y.o.  Admit date: 07/31/2013 Discharge date: 08/03/2013  Admission Diagnoses: Radiculopathy and spinal stenosis  Discharge Diagnoses:  Same  Past Medical History  Diagnosis Date  . Diabetes mellitus without complication   . Hyperlipidemia   . Heart murmur     mild per patient  . GERD (gastroesophageal reflux disease)     Surgeries: Procedure(s): ANTERIOR LATERAL LUMBAR FUSION 1 LEVEL/Left sided lumbar 3-4 lateral interbody fusion with instrumentation and allograft. POSTERIOR LUMBAR FUSION 1 LEVEL/Lumbar 3-4 posterior spinal fusion with instrumentation. on 07/31/2013   Discharged Condition: Improved  Hospital Course: [name] is an 47 y.o. female who was admitted 07/31/2013 for operative treatment of radiculopathy and spinal stenosis. Patient has severe unremitting pain that affects sleep, daily activities, and work/hobbies. After pre-op clearance the patient was taken to the operating room on 07/31/2013 and underwent  Procedure(s): ANTERIOR LATERAL LUMBAR FUSION 1 LEVEL/Left sided lumbar 3-4 lateral interbody fusion with instrumentation and allograft. POSTERIOR LUMBAR FUSION 1 LEVEL/Lumbar 3-4 posterior spinal fusion with instrumentation..    Patient was given perioperative antibiotics:  Anti-infectives   Start     Dose/Rate Route Frequency Ordered Stop   07/31/13 2000  ceFAZolin (ANCEF) IVPB 1 g/50 mL premix     1 g 100 mL/hr over 30 Minutes Intravenous Every 8 hours 07/31/13 1959 08/01/13 0517   07/31/13 0600  ceFAZolin (ANCEF) IVPB 2 g/50 mL premix     2 g 100 mL/hr over 30 Minutes Intravenous On call to O.R. 07/30/13 1409 07/31/13 1415       Patient was given sequential compression devices, early ambulation to prevent DVT.  Patient benefited maximally from hospital stay and there were no complications.    Recent vital signs: BP 114/68  Pulse 69  Temp(Src) 97.3 F (36.3 C) (Oral)  Resp 16  Ht 5'  3" (1.6 m)  Wt 82.555 kg (182 lb)  BMI 32.25 kg/m2  SpO2 96%  Discharge Medications:     Medication List         albuterol 108 (90 BASE) MCG/ACT inhaler  Commonly known as:  PROVENTIL HFA;VENTOLIN HFA  Inhale 2 puffs into the lungs every 6 (six) hours as needed for wheezing.     cyclobenzaprine 10 MG tablet  Commonly known as:  FLEXERIL  Take 1 tablet (10 mg total) by mouth 2 (two) times daily as needed for muscle spasms.     HYDROcodone-acetaminophen 7.5-325 MG per tablet  Commonly known as:  NORCO  Take 1 tablet by mouth every 4 (four) hours as needed for pain.     JANUMET XR 50-500 MG Tb24  Generic drug:  SitaGLIPtin-MetFORMIN HCl  Take by mouth 2 (two) times daily.     omeprazole 40 MG capsule  Commonly known as:  PRILOSEC  Take 40 mg by mouth daily.     rosuvastatin 10 MG tablet  Commonly known as:  CRESTOR  Take 10 mg by mouth daily.        Diagnostic Studies: Dg Chest 2 View  07/25/2013   CLINICAL DATA:  Preoperative evaluation for lumbar spine surgery, history smoking, diabetes, GERD, hyperlipidemia, heart murmur  EXAM: CHEST  2 VIEW  COMPARISON:  A 11/08/2011  FINDINGS: Normal heart size, mediastinal contours, and pulmonary vascularity.  Bronchitic changes with mild chronic accentuation of perihilar interstitial markings similar to previous exam.  No acute infiltrate, pleural effusion or pneumothorax.  Bones unremarkable.  IMPRESSION: Chronic bronchitic changes.   Electronically Signed  By: Ulyses Southward M.D.   On: 07/25/2013 10:00   Dg Lumbar Spine 2-3 Views  07/31/2013   CLINICAL DATA:  Lumbar fusion.  EXAM: DG C-ARM GT 120 MIN; LUMBAR SPINE - 2-3 VIEW  COMPARISON:  MRI 01/14/2013  FINDINGS: Lateral sideplate and screws are noted at L3-4 along with pedicle screws and a posterior rod on the left. There is and interbody fusion device. No complicating features.  IMPRESSION: Posterior, lateral and interbody fusion changes at L3-4.   Electronically Signed   By: Loralie Champagne M.D.   On: 07/31/2013 17:30   Dg C-arm Gt 120 Min  07/31/2013   CLINICAL DATA:  Lumbar fusion.  EXAM: DG C-ARM GT 120 MIN; LUMBAR SPINE - 2-3 VIEW  COMPARISON:  MRI 01/14/2013  FINDINGS: Lateral sideplate and screws are noted at L3-4 along with pedicle screws and a posterior rod on the left. There is and interbody fusion device. No complicating features.  IMPRESSION: Posterior, lateral and interbody fusion changes at L3-4.   Electronically Signed   By: Loralie Champagne M.D.   On: 07/31/2013 17:30    Disposition: 01-Home or Self Care  POD 3 L3-4 Left lateral interbody fusion with unilateral posterior instrumentation for spinal stenosis  D/C home today  -Written scripts and D/C instructions printed in chart  -home DME ordered -F/U in office 2weeks  Signed: [name] 08/06/2013, 10:50 AM

## 2013-09-08 DIAGNOSIS — J189 Pneumonia, unspecified organism: Secondary | ICD-10-CM

## 2013-09-08 HISTORY — DX: Pneumonia, unspecified organism: J18.9

## 2013-10-03 IMAGING — CR DG LUMBAR SPINE COMPLETE 4+V
5 series · 5 of 5 positions shown · non-contrast
Comparison: CT of the abdomen and pelvis 06/08/2012

CLINICAL DATA: Low back pain.  Left leg pain.

LUMBAR SPINE - COMPLETE 4+ VIEW

[t l-spine a.p.]
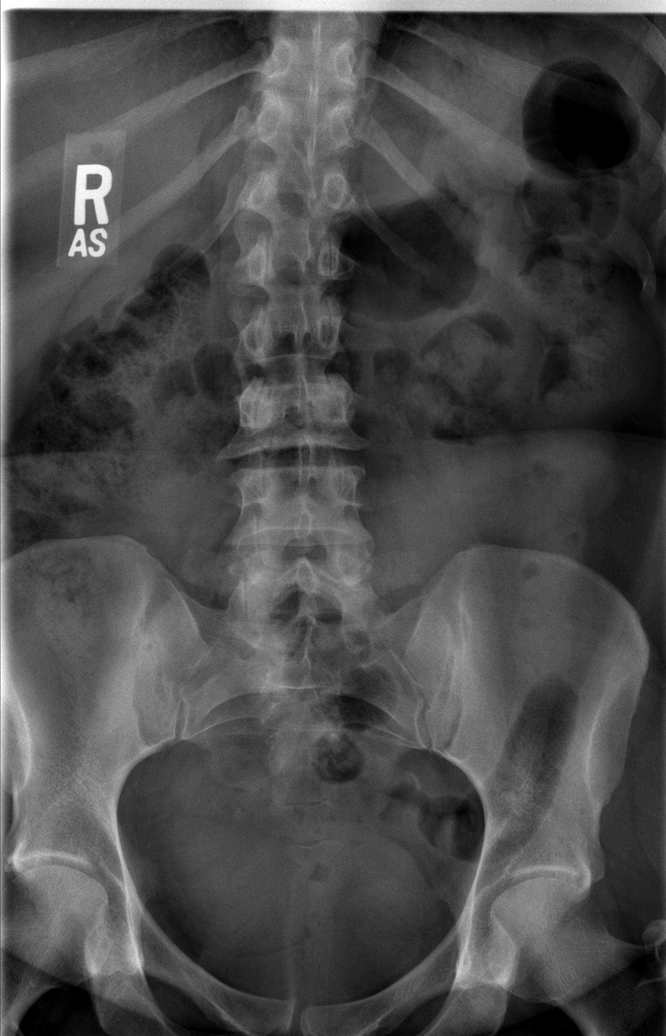

[t l-spine oblique exposure (1 of 2)]
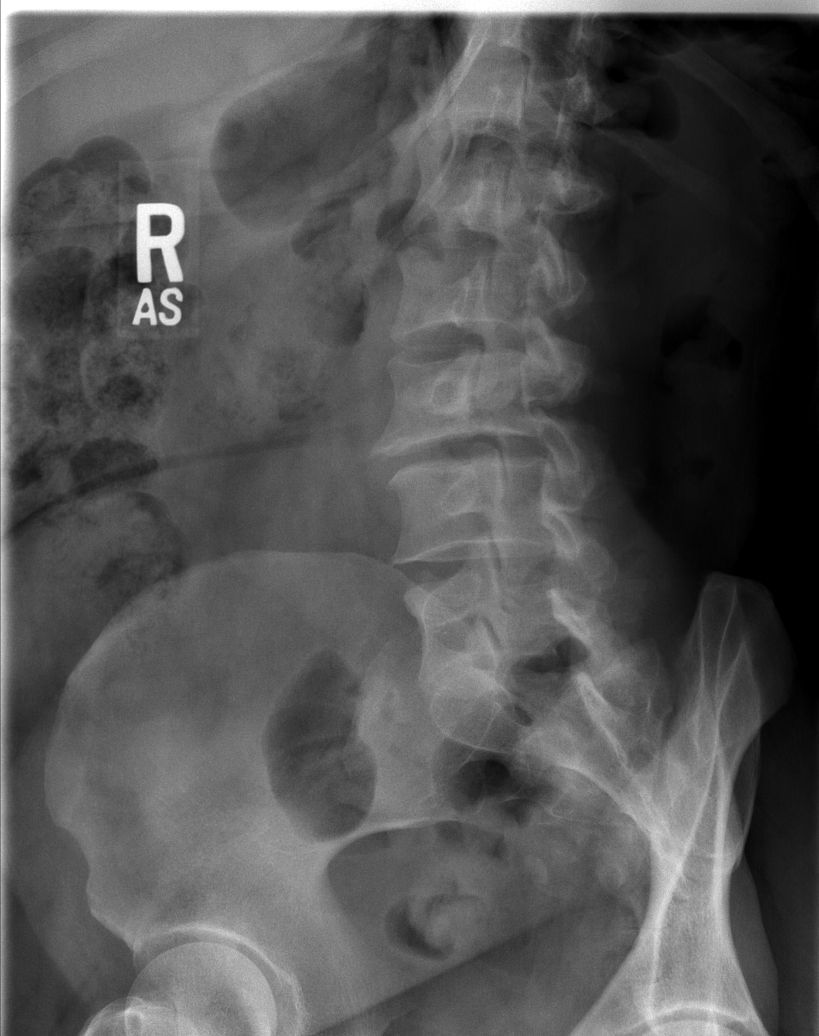

[t l-spine oblique exposure (2 of 2)]
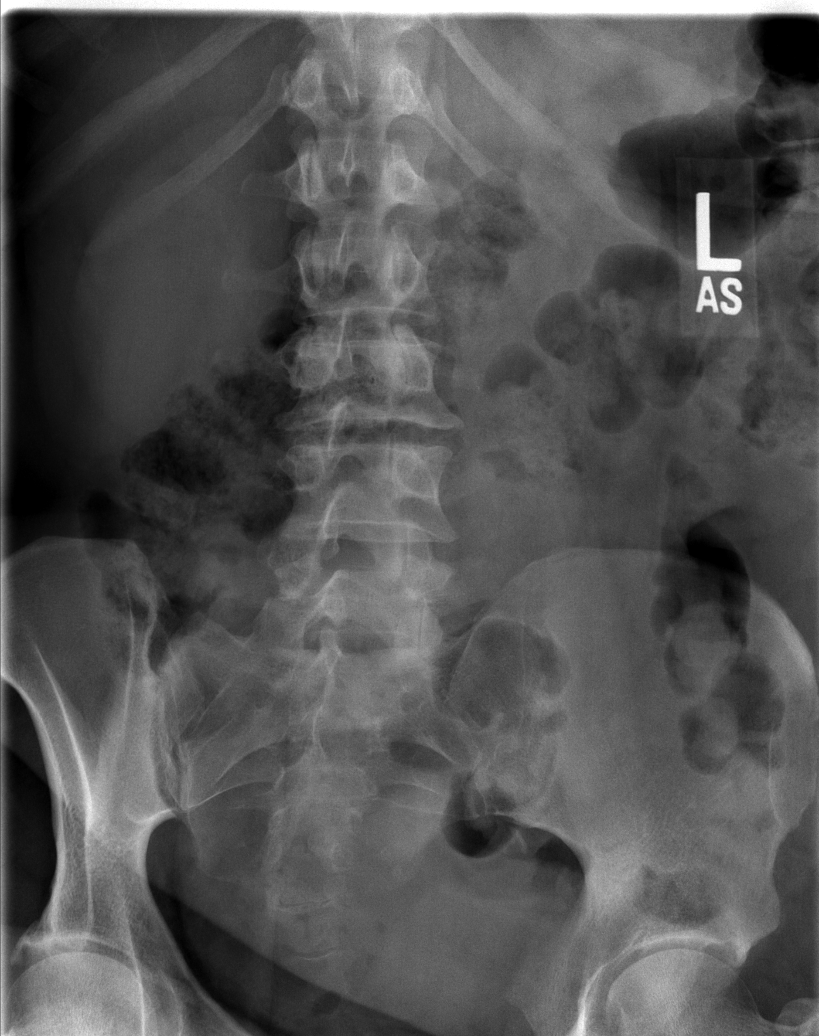

[t l-spine lat]
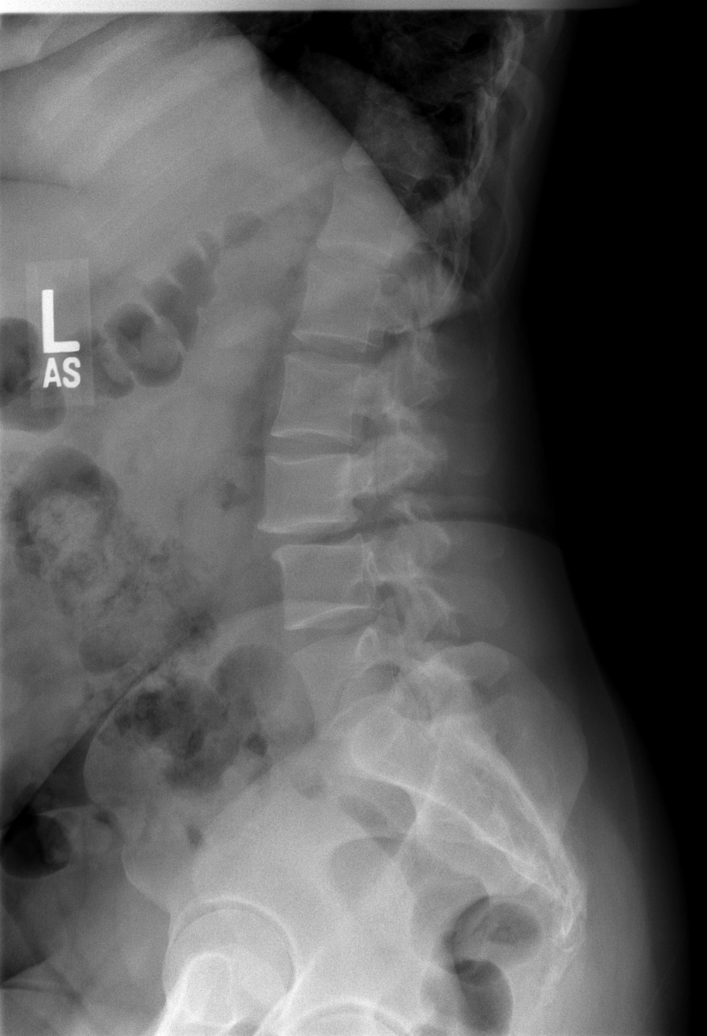

[t l-spine l5-s1 spot]
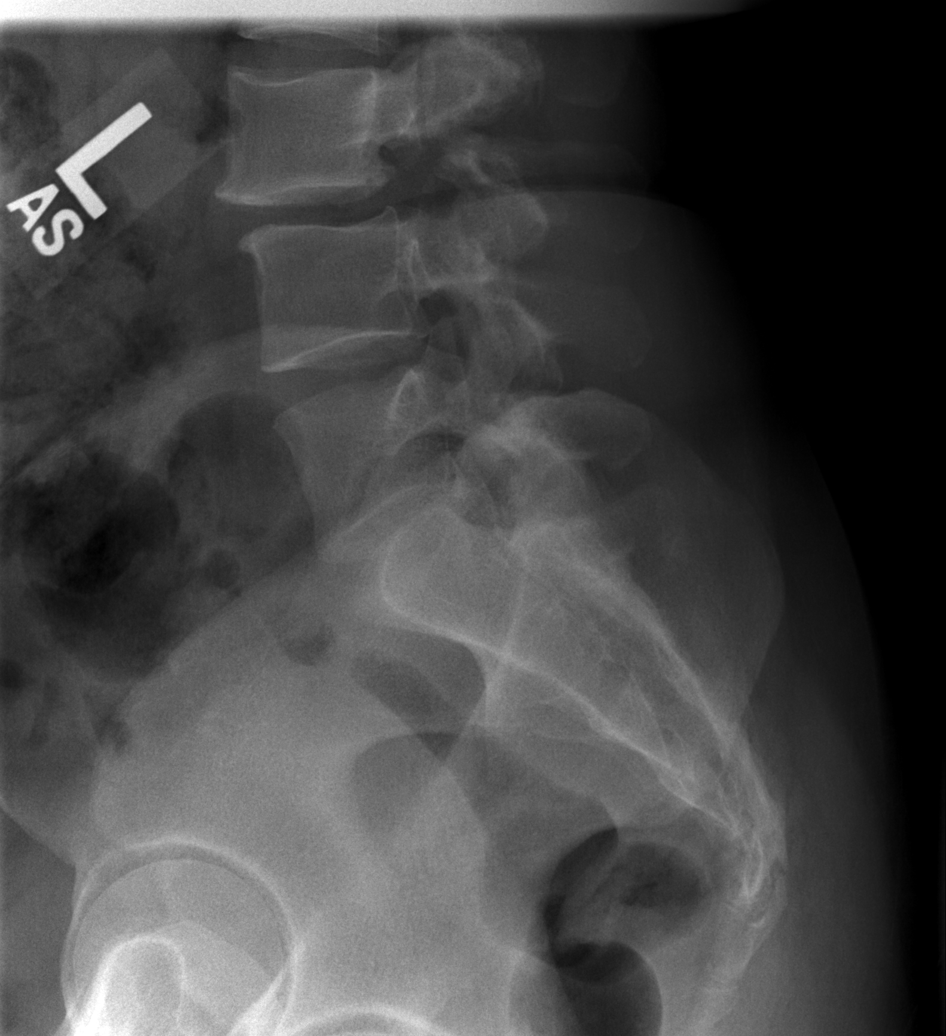

[5 of 5 positions shown; findings below may reference images not displayed]

FINDINGS: Bilateral L3 pars defects are noted.  Degenerative facet
disease at L2-3.  Slight (2 mm) anterolisthesis of L3 on L4.  These
findings are similar to prior CT.  No acute fracture.  Disc spaces
are maintained.  SI joints are symmetric and unremarkable.
IMPRESSION: Bilateral L3 pars defects with slight anterolisthesis of L3 on L4.
Degenerative facet disease at L2-3.  No acute findings.

## 2013-10-05 ENCOUNTER — Emergency Department (HOSPITAL_COMMUNITY): Payer: Medicaid Other

## 2013-10-05 ENCOUNTER — Encounter (HOSPITAL_COMMUNITY): Payer: Self-pay | Admitting: Emergency Medicine

## 2013-10-05 ENCOUNTER — Inpatient Hospital Stay (HOSPITAL_COMMUNITY)
Admission: EM | Admit: 2013-10-05 | Discharge: 2013-10-07 | DRG: 194 | Disposition: A | Discharge status: Home / Self Care | Payer: Medicaid Other | Attending: Internal Medicine | Admitting: Internal Medicine

## 2013-10-05 DIAGNOSIS — Z23 Encounter for immunization: Secondary | ICD-10-CM

## 2013-10-05 DIAGNOSIS — E785 Hyperlipidemia, unspecified: Secondary | ICD-10-CM | POA: Diagnosis present

## 2013-10-05 DIAGNOSIS — M5412 Radiculopathy, cervical region: Secondary | ICD-10-CM | POA: Diagnosis present

## 2013-10-05 DIAGNOSIS — E119 Type 2 diabetes mellitus without complications: Secondary | ICD-10-CM | POA: Diagnosis present

## 2013-10-05 DIAGNOSIS — F17200 Nicotine dependence, unspecified, uncomplicated: Secondary | ICD-10-CM | POA: Diagnosis present

## 2013-10-05 DIAGNOSIS — G5690 Unspecified mononeuropathy of unspecified upper limb: Secondary | ICD-10-CM | POA: Diagnosis present

## 2013-10-05 DIAGNOSIS — Z981 Arthrodesis status: Secondary | ICD-10-CM

## 2013-10-05 DIAGNOSIS — J189 Pneumonia, unspecified organism: Secondary | ICD-10-CM | POA: Diagnosis present

## 2013-10-05 DIAGNOSIS — M79609 Pain in unspecified limb: Secondary | ICD-10-CM

## 2013-10-05 DIAGNOSIS — K219 Gastro-esophageal reflux disease without esophagitis: Secondary | ICD-10-CM | POA: Diagnosis present

## 2013-10-05 DIAGNOSIS — J45901 Unspecified asthma with (acute) exacerbation: Secondary | ICD-10-CM | POA: Diagnosis present

## 2013-10-05 DIAGNOSIS — J180 Bronchopneumonia, unspecified organism: Principal | ICD-10-CM | POA: Diagnosis present

## 2013-10-05 DIAGNOSIS — R6889 Other general symptoms and signs: Secondary | ICD-10-CM | POA: Diagnosis present

## 2013-10-05 DIAGNOSIS — Z79899 Other long term (current) drug therapy: Secondary | ICD-10-CM

## 2013-10-05 DIAGNOSIS — M79641 Pain in right hand: Secondary | ICD-10-CM

## 2013-10-05 DIAGNOSIS — Z720 Tobacco use: Secondary | ICD-10-CM | POA: Diagnosis present

## 2013-10-05 LAB — POCT I-STAT, CHEM 8
BUN: <3 mg/dL — ABNORMAL LOW (ref 6–23)
Calcium, Ion: 1.14 mmol/L (ref 1.12–1.23)
Glucose, Bld: 171 mg/dL — ABNORMAL HIGH (ref 70–99)
HCT: 46 % (ref 36.0–46.0)
Sodium: 142 mEq/L (ref 135–145)
TCO2: 26 mmol/L (ref 0–100)

## 2013-10-05 LAB — BASIC METABOLIC PANEL
CO2: 25 mEq/L (ref 19–32)
Calcium: 9.2 mg/dL (ref 8.4–10.5)
Chloride: 101 mEq/L (ref 96–112)
Creatinine, Ser: 0.82 mg/dL (ref 0.50–1.10)
Sodium: 138 mEq/L (ref 135–145)

## 2013-10-05 LAB — GLUCOSE, CAPILLARY
Glucose-Capillary: 83 mg/dL (ref 70–99)
Glucose-Capillary: 306 mg/dL — ABNORMAL HIGH (ref 70–99)

## 2013-10-05 LAB — CBC WITH DIFFERENTIAL/PLATELET
Basophils Absolute: 0 10*3/uL (ref 0.0–0.1)
Eosinophils Relative: 0 % (ref 0–5)
Lymphocytes Relative: 14 % (ref 12–46)
Monocytes Relative: 1 % — ABNORMAL LOW (ref 3–12)
Neutro Abs: 9.3 10*3/uL — ABNORMAL HIGH (ref 1.7–7.7)
Neutrophils Relative %: 84 % — ABNORMAL HIGH (ref 43–77)
Platelets: 495 10*3/uL — ABNORMAL HIGH (ref 150–400)
RDW: 13.8 % (ref 11.5–15.5)
WBC: 11.1 10*3/uL — ABNORMAL HIGH (ref 4.0–10.5)

## 2013-10-05 LAB — LACTIC ACID, PLASMA: Lactic Acid, Venous: 2 mmol/L (ref 0.5–2.2)

## 2013-10-05 MED ORDER — METHYLPREDNISOLONE SODIUM SUCC 125 MG IJ SOLR
125.0000 mg | Freq: Once | INTRAMUSCULAR | Status: AC
  Administered 2013-10-05: 125 mg via INTRAVENOUS
  Filled 2013-10-05: qty 2

## 2013-10-05 MED ORDER — IPRATROPIUM BROMIDE 0.02 % IN SOLN
0.5000 mg | Freq: Once | RESPIRATORY_TRACT | Status: AC
  Administered 2013-10-05: 0.5 mg via RESPIRATORY_TRACT
  Filled 2013-10-05: qty 2.5

## 2013-10-05 MED ORDER — ALBUTEROL (5 MG/ML) CONTINUOUS INHALATION SOLN
10.0000 mg/h | INHALATION_SOLUTION | Freq: Once | RESPIRATORY_TRACT | Status: AC
  Filled 2013-10-05: qty 20

## 2013-10-05 MED ORDER — METHYLPREDNISOLONE SODIUM SUCC 125 MG IJ SOLR
60.0000 mg | Freq: Four times a day (QID) | INTRAMUSCULAR | Status: DC
  Administered 2013-10-05 – 2013-10-06 (×4): 60 mg via INTRAVENOUS
  Filled 2013-10-05 (×7): qty 0.96

## 2013-10-05 MED ORDER — DEXTROSE 5 % IV SOLN
1.0000 g | Freq: Three times a day (TID) | INTRAVENOUS | Status: DC
  Administered 2013-10-06 – 2013-10-07 (×4): 1 g via INTRAVENOUS
  Filled 2013-10-05 (×6): qty 1

## 2013-10-05 MED ORDER — INSULIN ASPART 100 UNIT/ML ~~LOC~~ SOLN
0.0000 [IU] | Freq: Three times a day (TID) | SUBCUTANEOUS | Status: DC
Start: 2013-10-06 — End: 2013-10-07
  Administered 2013-10-06 (×2): 5 [IU] via SUBCUTANEOUS
  Administered 2013-10-06: 3 [IU] via SUBCUTANEOUS
  Administered 2013-10-07: 8 [IU] via SUBCUTANEOUS
  Administered 2013-10-07: 3 [IU] via SUBCUTANEOUS

## 2013-10-05 MED ORDER — ALBUTEROL SULFATE (5 MG/ML) 0.5% IN NEBU
2.5000 mg | INHALATION_SOLUTION | RESPIRATORY_TRACT | Status: DC
  Administered 2013-10-06 (×2): 2.5 mg via RESPIRATORY_TRACT
  Filled 2013-10-05 (×2): qty 0.5

## 2013-10-05 MED ORDER — IPRATROPIUM BROMIDE 0.02 % IN SOLN
1.0000 mg | Freq: Once | RESPIRATORY_TRACT | Status: AC
  Administered 2013-10-05: 1 mg via RESPIRATORY_TRACT
  Filled 2013-10-05: qty 5

## 2013-10-05 MED ORDER — VANCOMYCIN HCL IN DEXTROSE 1-5 GM/200ML-% IV SOLN
1000.0000 mg | Freq: Three times a day (TID) | INTRAVENOUS | Status: DC
  Administered 2013-10-06 – 2013-10-07 (×5): 1000 mg via INTRAVENOUS
  Filled 2013-10-05 (×6): qty 200

## 2013-10-05 MED ORDER — PANTOPRAZOLE SODIUM 40 MG PO TBEC
40.0000 mg | DELAYED_RELEASE_TABLET | Freq: Every day | ORAL | Status: DC
  Administered 2013-10-06 – 2013-10-07 (×3): 40 mg via ORAL
  Filled 2013-10-05 (×3): qty 1

## 2013-10-05 MED ORDER — ALBUTEROL SULFATE (5 MG/ML) 0.5% IN NEBU
5.0000 mg | INHALATION_SOLUTION | RESPIRATORY_TRACT | Status: DC
  Administered 2013-10-05: 5 mg via RESPIRATORY_TRACT
  Filled 2013-10-05 (×2): qty 1

## 2013-10-05 MED ORDER — VANCOMYCIN HCL IN DEXTROSE 1-5 GM/200ML-% IV SOLN
1000.0000 mg | Freq: Once | INTRAVENOUS | Status: AC
  Administered 2013-10-05: 1000 mg via INTRAVENOUS
  Filled 2013-10-05: qty 200

## 2013-10-05 MED ORDER — SODIUM CHLORIDE 0.9 % IV SOLN: INTRAVENOUS | Status: AC

## 2013-10-05 MED ORDER — ALBUTEROL SULFATE (5 MG/ML) 0.5% IN NEBU: 2.5000 mg | INHALATION_SOLUTION | RESPIRATORY_TRACT | Status: AC | PRN

## 2013-10-05 MED ORDER — DIAZEPAM 5 MG PO TABS: 5.0000 mg | ORAL_TABLET | Freq: Three times a day (TID) | ORAL | Status: DC | PRN

## 2013-10-05 MED ORDER — ALBUTEROL (5 MG/ML) CONTINUOUS INHALATION SOLN
10.0000 mg | INHALATION_SOLUTION | RESPIRATORY_TRACT | Status: AC
  Administered 2013-10-05: 10 mg via RESPIRATORY_TRACT

## 2013-10-05 MED ORDER — ATORVASTATIN CALCIUM 20 MG PO TABS
20.0000 mg | ORAL_TABLET | Freq: Every day | ORAL | Status: DC
  Administered 2013-10-06: 20 mg via ORAL
  Filled 2013-10-05 (×2): qty 1

## 2013-10-05 MED ORDER — DEXTROSE 5 % IV SOLN
1.0000 g | Freq: Once | INTRAVENOUS | Status: AC
  Administered 2013-10-05: 1 g via INTRAVENOUS
  Filled 2013-10-05 (×2): qty 1

## 2013-10-05 MED ORDER — OSELTAMIVIR PHOSPHATE 75 MG PO CAPS
75.0000 mg | ORAL_CAPSULE | Freq: Two times a day (BID) | ORAL | Status: DC
  Administered 2013-10-05 – 2013-10-06 (×2): 75 mg via ORAL
  Filled 2013-10-05 (×3): qty 1

## 2013-10-05 MED ORDER — INSULIN ASPART 100 UNIT/ML ~~LOC~~ SOLN: 0.0000 [IU] | Freq: Three times a day (TID) | SUBCUTANEOUS | Status: DC

## 2013-10-05 MED ORDER — SODIUM CHLORIDE 0.9 % IV SOLN: 1000.0000 mL | INTRAVENOUS | Status: DC

## 2013-10-05 MED ORDER — ACETAMINOPHEN 325 MG PO TABS: 650.0000 mg | ORAL_TABLET | Freq: Four times a day (QID) | ORAL | Status: DC | PRN

## 2013-10-05 MED ORDER — INSULIN ASPART 100 UNIT/ML ~~LOC~~ SOLN
0.0000 [IU] | Freq: Every day | SUBCUTANEOUS | Status: DC
  Administered 2013-10-06: 2 [IU] via SUBCUTANEOUS
  Administered 2013-10-06: 4 [IU] via SUBCUTANEOUS

## 2013-10-05 MED ORDER — IPRATROPIUM BROMIDE 0.02 % IN SOLN
0.5000 mg | RESPIRATORY_TRACT | Status: DC
  Administered 2013-10-06 (×2): 0.5 mg via RESPIRATORY_TRACT
  Filled 2013-10-05 (×2): qty 2.5

## 2013-10-05 MED ORDER — ALBUTEROL SULFATE HFA 108 (90 BASE) MCG/ACT IN AERS: 2.0000 | INHALATION_SPRAY | Freq: Four times a day (QID) | RESPIRATORY_TRACT | Status: DC | PRN

## 2013-10-05 MED ORDER — ENOXAPARIN SODIUM 40 MG/0.4ML ~~LOC~~ SOLN
40.0000 mg | SUBCUTANEOUS | Status: DC
  Administered 2013-10-05 – 2013-10-06 (×2): 40 mg via SUBCUTANEOUS
  Filled 2013-10-05 (×3): qty 0.4

## 2013-10-05 MED ORDER — SODIUM CHLORIDE 0.9 % IV SOLN
INTRAVENOUS | Status: DC
  Administered 2013-10-06: 21:00:00 via INTRAVENOUS

## 2013-10-05 MED ORDER — GUAIFENESIN-DM 100-10 MG/5ML PO SYRP: 5.0000 mL | ORAL_SOLUTION | ORAL | Status: DC | PRN

## 2013-10-05 MED ORDER — SODIUM CHLORIDE 0.9 % IV SOLN
1000.0000 mL | Freq: Once | INTRAVENOUS | Status: AC
  Administered 2013-10-05: 1000 mL via INTRAVENOUS

## 2013-10-05 MED ORDER — HYDROMORPHONE HCL PF 1 MG/ML IJ SOLN
1.0000 mg | Freq: Once | INTRAMUSCULAR | Status: AC
  Administered 2013-10-05: 1 mg via INTRAVENOUS
  Filled 2013-10-05: qty 1

## 2013-10-05 NOTE — ED Notes (Signed)
Pt crying and very anxious about situation.pt complaining of right hand and finger numbness and burning.

## 2013-10-05 NOTE — H&P (Signed)
[facility] History and Physical  Liya Strollo ZOX:[id number] DOB: [birth year] DOA: 10/05/2013  Referring physician: flu like symptoms for 1 week PCP: Dr August Saucer   Chief Complaint:  flu like symptoms 1 week   HPI:  47 year old female with history of diabetes mellitus, hyperlipidemia, asthma, active smoker, recent lumbar [place] in [place] who presented with one-week history of-like symptoms. She reports having nausea, subjective fever with chills, generalized body ache, whose bowel movements, cough with nasal congestion, fatigue and loss of appetite. Today she developed numbness and tingling on the right fourth and fifth digits and the ulnar border of the right handed. She reports that she has had these symptoms chronically secondary to her neck pain but since he had flulike symptoms as well she decided to come to the ED. She reports having productive cough with yellowish phlegm. She reports using albuterol inhaler at home without much relief. Patient reports her husband is also sick with COPD exacerbation. Denies recent travel.  Course in the ED Vitals were stable. Physical exam showed diffuse bilateral wheeze. Patient was given IV fluids and albuterol nebs and plan for discharge however she was noted to desaturate to 80s. A chest x-ray was done which showed bronchopneumonia affecting the right middle and left lower lobe. Patient given a dose of IV vancomycin and cefepime and dry hospitalist consulted for admission.  Review of Systems:  Constitutional:  fever, chills, appetite change and fatigue.  HEENT: congestion, sore throat, rhinorrhea, sneezing,Denies photophobia,  hearing loss, ear pain,  mouth sores, trouble swallowing, neck pain, neck stiffness and tinnitus.   Respiratory: Denies SOB, DOE, cough,  and wheezing.   Cardiovascular: Denies chest pain, palpitations and leg swelling.  Gastrointestinal:  Nausea, diarrhea,Denies  vomiting, abdominal pain,  constipation, blood in  stool and abdominal distention.  Genitourinary: Denies dysuria, urgency, frequency, hematuria, flank pain and difficulty urinating.  Endocrine: Denies polyuria, polydipsia. Musculoskeletal: myalgias, denies back pain, joint swelling, arthralgias and gait problem.  Skin: Denies pallor, rash and wound.  Neurological: Denies dizziness, seizures, syncope, weakness, light-headedness, nd headaches.  chronic numbness over right hand Hematological: Denies adenopathy.  Psychiatric/Behavioral: Denies  mood changes, confusion, nervousness, sleep disturbance and agitation   Past Medical History  Diagnosis Date  . Diabetes mellitus without complication   . Hyperlipidemia   . Heart murmur     mild per patient  . GERD (gastroesophageal reflux disease)    Past Surgical History  Procedure Laterality Date  . Appendectomy  30- 40 years  . Anterior lat lumbar fusion Left 07/31/2013    Procedure: ANTERIOR LATERAL LUMBAR FUSION 1 LEVEL/Left sided lumbar 3-4 lateral interbody fusion with instrumentation and allograft.;  Surgeon: Emilee Hero, MD;  Location: [facility] OR;  Service: Orthopedics;  Laterality: Left;  Left sided lumbar 3-4 lateral interbody fusion with instrumentation and allograft.   Social History:  reports that she has been smoking.  She does not have any smokeless tobacco history on file. She reports that she drinks alcohol. She reports that she does not use illicit drugs.  No Known Allergies  History reviewed. No pertinent family history.  Prior to Admission medications   Medication Sig Start Date End Date Taking? Authorizing Provider  albuterol (PROVENTIL HFA;VENTOLIN HFA) 108 (90 BASE) MCG/ACT inhaler Inhale 2 puffs into the lungs every 6 (six) hours as needed for wheezing.   Yes Historical Provider, MD  Chlorphen-Pseudoephed-APAP (THERAFLU FLU/COLD PO) Take 1 packet by mouth once.   Yes Historical Provider, MD  diazepam (VALIUM) 5 MG tablet  Take 5 mg by mouth every 8 (eight) hours as  needed for anxiety.   Yes Historical Provider, MD  HYDROcodone-acetaminophen (NORCO) 7.5-325 MG per tablet Take 1 tablet by mouth every 4 (four) hours as needed for pain. 08/03/13  Yes Jodi Marble, MD  omeprazole (PRILOSEC) 40 MG capsule Take 40 mg by mouth daily.   Yes Historical Provider, MD  Pseudoeph-Doxylamine-DM-APAP (NYQUIL PO) Take 30 mLs by mouth once.   Yes Historical Provider, MD  rosuvastatin (CRESTOR) 10 MG tablet Take 10 mg by mouth daily.   Yes Historical Provider, MD  SitaGLIPtin-MetFORMIN HCl (JANUMET XR) 50-500 MG TB24 Take by mouth 2 (two) times daily.   Yes Historical Provider, MD    Physical Exam:  Filed Vitals:   10/05/13 1630 10/05/13 1709 10/05/13 1800 10/05/13 1815  BP: 136/74  130/69 132/97  Pulse: 78  91 100  Temp:      TempSrc:      Resp: 20  25 17   SpO2: 97% 98% 99% 100%    Constitutional: Vital signs reviewed. Middle aged female in no acute distress HEENT: No pallor, no icterus, moist oral mucosa, no cervical lymphadenopathy Chest: Diffuse wheezing bilaterally, no crackles or rhonchi Cardiovascular: RRR, S1 normal, S2 normal, no MRG,  Abdominal: Soft. Non-tender, non-distended, bowel sounds are normal, no masses, organomegaly, or guarding present.   extremities: Warm, no edema CNS: AAO x3 Basic Metabolic Panel:  Recent Labs Lab 10/05/13 1612 10/05/13 1713  NA 138 142  K 3.6 3.7  CL 101 103  CO2 25  --   GLUCOSE 170* 171*  BUN 5* <3*  CREATININE 0.82 0.90  CALCIUM 9.2  --    Liver Function Tests: No results found for this basename: AST, ALT, ALKPHOS, BILITOT, PROT, ALBUMIN,  in the last 168 hours No results found for this basename: LIPASE, AMYLASE,  in the last 168 hours No results found for this basename: AMMONIA,  in the last 168 hours CBC:  Recent Labs Lab 10/05/13 1612 10/05/13 1713  WBC 11.1*  --   NEUTROABS 9.3*  --   HGB 14.4 15.6*  HCT 43.6 46.0  MCV 93.6  --   PLT 495*  --    Cardiac Enzymes: No results found for  this basename: CKTOTAL, CKMB, CKMBINDEX, TROPONINI,  in the last 168 hours BNP: No components found with this basename: POCBNP,  CBG:  Recent Labs Lab 10/05/13 1142  GLUCAP 83    Radiological Exams on Admission: Dg Chest 2 View  10/05/2013   CLINICAL DATA:  Short of breath  EXAM: CHEST  2 VIEW  COMPARISON:  07/25/2013  FINDINGS: Heart size is normal. There are patchy pulmonary infiltrates in the lower lungs, most notable in the right middle lobe but also probably in the left lower lobe. This consistent with Brock pneumonia. No dense consolidation or collapse. No effusions. No acute bony finding.  IMPRESSION: Bronchopneumonia affecting the right middle lobe and left lower lobe. No dense consolidation.   Electronically Signed   By: Paulina Fusi M.D.   On: 10/05/2013 15:57      Assessment/Plan Principal Problem:   HCAP (healthcare-associated pneumonia) Admit to MedSurg Will treat empirically with IV vancomycin and Zosyn. Follow blood culture, sputum culture, strep antigen and Legionella antigen O2 via nasal cannula Continue albuterol and Atrovent nebs. Counseled on smoking cessation.  Active Problems:   Flu-like symptoms Continue supportive care with IV fluids, Tylenol, and antitussives. We'll check for flu PCR. Started on empiric Tamiflu -Droplet precaution  Asthma exacerbation Will place on scheduled naps and albuterol inhaler. Solu-Medrol 60 mg every 6 hours.    Diabetes mellitus hold metformin. Place on sliding scale insulin    GERD (gastroesophageal reflux disease) Continue PPI    Tobacco use Counseled on cessation  Right ulnar neuropathy.  Chronic Followup as outpatient  Diet: Diabetic DVT prophylaxis: Subcutaneous Lovenox  Code Status: Full code Family Communication: None at bedside Disposition Plan: Home once improved  Eddie North [facility] Pager [phone]  If 7PM-7AM, please contact night-coverage [email or web address] Password  [facility] 10/05/2013, 7:05 PM    Total time spent on admission: 70 minutes

## 2013-10-05 NOTE — Progress Notes (Signed)
ANTIBIOTIC CONSULT NOTE - INITIAL  Pharmacy Consult for vancomycin Indication: PNA  No Known Allergies  Patient Measurements: weight ~83kg, height 63 inches    Vital Signs: Temp: 98.3 F (36.8 C) (12/28 1146) Temp src: Oral (12/28 1146) BP: 141/116 mmHg (12/28 1617) Pulse Rate: 90 (12/28 1617) Intake/Output from previous day:   Intake/Output from this shift:    Labs: No results found for this basename: WBC, HGB, PLT, LABCREA, CREATININE,  in the last 72 hours The CrCl is unknown because both a height and weight (above a minimum accepted value) are required for this calculation. No results found for this basename: VANCOTROUGH, VANCOPEAK, VANCORANDOM, GENTTROUGH, GENTPEAK, GENTRANDOM, TOBRATROUGH, TOBRAPEAK, TOBRARND, AMIKACINPEAK, AMIKACINTROU, AMIKACIN,  in the last 72 hours   Microbiology: No results found for this or any previous visit (from the past 720 hour(s)).  Medical History: Past Medical History  Diagnosis Date  . Diabetes mellitus without complication   . Hyperlipidemia   . Heart murmur     mild per patient  . GERD (gastroesophageal reflux disease)     Medications:  See med rec  Assessment: Patient is a 47 y.o F preseted to the ED with c/o tight hand numbness.  Chest x-ray with noted infiltrate in the right middle and left lower lobe.  To start abx for PNA.   Goal of Therapy:  Vancomycin trough level 15-20 mcg/ml  Plan:  1) vancomycin 1gm IV q8h 2) f/u MD dose for GNR coverage  Stevenson Windmiller P 10/05/2013,4:34 PM

## 2013-10-05 NOTE — ED Notes (Signed)
Pt c/o nausea and diarrhea x 4 days; pt sts some numbness to right hand x weeks; pt  tearful

## 2013-10-05 NOTE — ED Provider Notes (Signed)
CSN: [id number]     Arrival date & time 10/05/13  1101 History   First MD Initiated Contact with Patient 10/05/13 1125     Chief Complaint  Patient presents with  . Diarrhea  . Nausea   (Consider location/radiation/quality/duration/timing/severity/associated sxs/prior Treatment) The history is provided by the patient. No language interpreter was used.   This is a 47 year old female with a past medical history of diabetes, hyperlipidemia, and recent lumbar fusion in October of 2014.  The patient presents today chiefly for pain in her right hand.  The patient states that for approximately one week she has had flulike symptoms of cough, nasal congestion, diffuse myalgias, arthralgias, fatigue and loss of appetite.  She complains of subjective fever at home.  The patient states that he also developed numbness and tingling in her right fourth and fifth digits and along the ulnar border of her hand.  The patient also endorses watery stools beginning today. It has been worsening.  She endorses some loss of grip strength when holding heavy objects.  The patient states that yesterday she began having severe pain on that side of her hand.  Today the  pain became too severe and she sought attention. Patient endorses a history of chronic neck pain.  Denies any history of injury or known arthritic conditions in her neck.  She denies any rashes. Patient is afebrile today.  Past Medical History  Diagnosis Date  . Diabetes mellitus without complication   . Hyperlipidemia   . Heart murmur     mild per patient  . GERD (gastroesophageal reflux disease)    Past Surgical History  Procedure Laterality Date  . Appendectomy  30- 40 years  . Anterior lat lumbar fusion Left 07/31/2013    Procedure: ANTERIOR LATERAL LUMBAR FUSION 1 LEVEL/Left sided lumbar 3-4 lateral interbody fusion with instrumentation and allograft.;  Surgeon: Emilee Hero, MD;  Location: [facility] OR;  Service: Orthopedics;  Laterality: Left;   Left sided lumbar 3-4 lateral interbody fusion with instrumentation and allograft.   History reviewed. No pertinent family history. History  Substance Use Topics  . Smoking status: Current Every Day Smoker -- 0.25 packs/day for 20 years  . Smokeless tobacco: Not on file     Comment: Was at 1 ppd  . Alcohol Use: Yes     Comment: beer everynow and then per patient   OB History   Grav Para Term Preterm Abortions TAB SAB Ect Mult Living                 Review of Systems Ten systems reviewed and are negative for acute change, except as noted in the HPI.    Allergies  Review of patient's allergies indicates no known allergies.  Home Medications   Current Outpatient Rx  Name  Route  Sig  Dispense  Refill  . albuterol (PROVENTIL HFA;VENTOLIN HFA) 108 (90 BASE) MCG/ACT inhaler   Inhalation   Inhale 2 puffs into the lungs every 6 (six) hours as needed for wheezing.         . cyclobenzaprine (FLEXERIL) 10 MG tablet   Oral   Take 1 tablet (10 mg total) by mouth 2 (two) times daily as needed for muscle spasms.   30 tablet   0   . HYDROcodone-acetaminophen (NORCO) 7.5-325 MG per tablet   Oral   Take 1 tablet by mouth every 4 (four) hours as needed for pain.   30 tablet   0   . omeprazole (PRILOSEC) 40 MG  capsule   Oral   Take 40 mg by mouth daily.         . rosuvastatin (CRESTOR) 10 MG tablet   Oral   Take 10 mg by mouth daily.         . SitaGLIPtin-MetFORMIN HCl (JANUMET XR) 50-500 MG TB24   Oral   Take by mouth 2 (two) times daily.          BP 131/74  Pulse 81  Temp(Src) 98.3 F (36.8 C) (Oral)  Resp 24  SpO2 96%  LMP 03/07/2012 Physical Exam Physical Exam  Nursing note and vitals reviewed. Constitutional: She is oriented to person, place, and time. She appears well-developed and well-nourished. No distress.  HENT:  Head: Normocephalic and atraumatic.  Eyes: Conjunctivae normal and EOM are normal. Pupils are equal, round, and reactive to light. No  scleral icterus.  Neck: Normal range of motion.  Cardiovascular: Normal rate, regular rhythm and normal heart sounds.  Exam reveals no gallop and no friction rub.   No murmur heard. Pulmonary/Chest: Increased respiratory effort, diffuse inspiratory and expiratory wheezes in all fields.   Abdominal: Soft. Bowel sounds are normal. She exhibits no distension and no mass. There is no tenderness. There is no guarding.  Neurological: She is alert and oriented to person, place, and time.  Musculoskeletal: A right hand and wrist exam was performed. SKIN: normal SWELLING: none WARMTH: no warmth TENDERNESS: none ROM: Full STRENGTH: normal NEUROVASCULAR EXAM: normal  Skin: Skin is warm and dry. She is not diaphoretic.     ED Course  Procedures (including critical care time) Labs Review Labs Reviewed  GLUCOSE, CAPILLARY   Imaging Review No results found.  EKG Interpretation    Date/Time:  Sunday October 05 2013 11:46:18 EST Ventricular Rate:  80 PR Interval:  147 QRS Duration: 79 QT Interval:  415 QTC Calculation: 479 R Axis:   74 Text Interpretation:  Sinus rhythm Artifact Baseline wander ST elevation, consider early repolarization When compared with ECG of 07/22/2013 No significant change was found Confirmed by [facility]  MD, KATHLEEN ([id number]) on 10/05/2013 11:54:59 AM            MDM   1. HCAP (healthcare-associated pneumonia)   2. Hand pain, right   3. Asthma exacerbation   4. Flu-like symptoms    Filed Vitals:   10/05/13 1146  BP: 131/74  Pulse: 81  Temp: 98.3 F (36.8 C)  Resp: 24    The injury complaint of pain in the hand in the ulnar nerve distribution.  This is likely secondary to radiculopathy given her history of known lumbar degenerative disease.  The patient also complains of watery stools and flulike symptoms.  These have resolved for the most part.  Right ear.  Patient did begin wheezing while waiting for examiner.  She is given pain medication,  albuterol DuoNeb with ipratropium, IV Solu-Medrol.  We'll reevaluate patient shortly   2:50 PM Patient states she is feeilng better, wheezes decreased.  Nurse Dahlia Byes reports that the patient is dexsaturating into high 80%s patient will get cxr.    4:28 PM Filed Vitals:   10/05/13 1345 10/05/13 1349 10/05/13 1457 10/05/13 1617  BP: 155/87   141/116  Pulse: 72   90  Temp:      TempSrc:      Resp: 18     SpO2: 89% 94% 96% 98%   Patient wheezing returned, Desaturated to 86% with ambulation. She has confirmed bronchopneumonia in the r middle lobe and  the left lower lobe. Patient was admitted to t the hospital for surgery less than 90 days ago making her an HCAP.  Labs are pending. Will begin treatment for HCAP here in the ED.  I have discussed the Case with Dr. Gonzella Lex who will admit the patient for her HCAP and hypoxia. The patient appears reasonably stabilized for admission considering the current resources, flow, and capabilities available in the ED at this time, and I doubt any other [facility] requiring further screening and/or treatment in the ED prior to admission.   Arthor Captain, PA-C 10/06/13 1901

## 2013-10-06 ENCOUNTER — Encounter (HOSPITAL_COMMUNITY): Payer: Self-pay | Admitting: *Deleted

## 2013-10-06 DIAGNOSIS — E119 Type 2 diabetes mellitus without complications: Secondary | ICD-10-CM

## 2013-10-06 DIAGNOSIS — K219 Gastro-esophageal reflux disease without esophagitis: Secondary | ICD-10-CM

## 2013-10-06 LAB — INFLUENZA PANEL BY PCR (TYPE A & B)
H1N1 flu by pcr: NOT DETECTED
Influenza B By PCR: NEGATIVE

## 2013-10-06 LAB — LEGIONELLA ANTIGEN, URINE: Legionella Antigen, Urine: NEGATIVE

## 2013-10-06 LAB — HIV ANTIBODY (ROUTINE TESTING W REFLEX): HIV: NONREACTIVE

## 2013-10-06 LAB — GLUCOSE, CAPILLARY
Glucose-Capillary: 217 mg/dL — ABNORMAL HIGH (ref 70–99)
Glucose-Capillary: 165 mg/dL — ABNORMAL HIGH (ref 70–99)

## 2013-10-06 MED ORDER — PNEUMOCOCCAL VAC POLYVALENT 25 MCG/0.5ML IJ INJ
0.5000 mL | INJECTION | INTRAMUSCULAR | Status: AC
  Administered 2013-10-07: 0.5 mL via INTRAMUSCULAR
  Filled 2013-10-06 (×2): qty 0.5

## 2013-10-06 MED ORDER — ALBUTEROL SULFATE (5 MG/ML) 0.5% IN NEBU: 2.5000 mg | INHALATION_SOLUTION | Freq: Three times a day (TID) | RESPIRATORY_TRACT | Status: DC

## 2013-10-06 MED ORDER — IPRATROPIUM BROMIDE 0.02 % IN SOLN: 0.5000 mg | RESPIRATORY_TRACT | Status: DC

## 2013-10-06 MED ORDER — METHYLPREDNISOLONE SODIUM SUCC 125 MG IJ SOLR
60.0000 mg | Freq: Two times a day (BID) | INTRAMUSCULAR | Status: AC
  Administered 2013-10-07: 60 mg via INTRAVENOUS
  Filled 2013-10-06: qty 0.96

## 2013-10-06 MED ORDER — ALBUTEROL SULFATE (2.5 MG/3ML) 0.083% IN NEBU
2.5000 mg | INHALATION_SOLUTION | Freq: Three times a day (TID) | RESPIRATORY_TRACT | Status: DC
  Administered 2013-10-06 – 2013-10-07 (×2): 2.5 mg via RESPIRATORY_TRACT
  Filled 2013-10-06 (×5): qty 3

## 2013-10-06 MED ORDER — IPRATROPIUM BROMIDE 0.02 % IN SOLN
0.5000 mg | Freq: Three times a day (TID) | RESPIRATORY_TRACT | Status: DC
  Administered 2013-10-06 – 2013-10-07 (×4): 0.5 mg via RESPIRATORY_TRACT
  Filled 2013-10-06 (×4): qty 2.5

## 2013-10-06 MED ORDER — PREDNISONE 20 MG PO TABS
40.0000 mg | ORAL_TABLET | Freq: Every day | ORAL | Status: DC
  Administered 2013-10-07: 40 mg via ORAL
  Filled 2013-10-06 (×2): qty 2

## 2013-10-06 MED ORDER — INFLUENZA VAC SPLIT QUAD 0.5 ML IM SUSP
0.5000 mL | INTRAMUSCULAR | Status: AC
  Administered 2013-10-07: 0.5 mL via INTRAMUSCULAR
  Filled 2013-10-06: qty 0.5

## 2013-10-06 MED ORDER — HYDROCODONE-ACETAMINOPHEN 5-325 MG PO TABS
1.0000 | ORAL_TABLET | Freq: Four times a day (QID) | ORAL | Status: DC | PRN
  Administered 2013-10-06 – 2013-10-07 (×4): 1 via ORAL
  Filled 2013-10-06 (×4): qty 1

## 2013-10-06 MED ORDER — ALBUTEROL SULFATE (5 MG/ML) 0.5% IN NEBU
2.5000 mg | INHALATION_SOLUTION | Freq: Three times a day (TID) | RESPIRATORY_TRACT | Status: DC
  Administered 2013-10-06 (×2): 2.5 mg via RESPIRATORY_TRACT
  Filled 2013-10-06: qty 0.5

## 2013-10-06 NOTE — Progress Notes (Addendum)
[facility] PROGRESS NOTE  [name] ZOX:[id number] DOB: 1966/02/22 DOA: 10/05/2013 PCP: Default, Provider, MD  Assessment/Plan: HCAP (healthcare-associated pneumonia)  - continue IV vancomycin and cefepime pending blood culture, sputum culture, Legionella antigen;  -strep antigen negative  O2 via nasal cannula  -Continue albuterol and Atrovent nebs.  -Counseled on smoking cessation on admission.  Active Problems:  Flu-like symptoms  -influenza negative, will DC Tamiflu. Asthma exacerbation  -Improving clinically, we'll taper solumedrol and change to prednisone in the a.m. Continue scheduled nebs and albuterol inhaler.   Diabetes mellitus  hold metformin. Continue on sliding scale insulin  GERD (gastroesophageal reflux disease)  Continue PPI  Tobacco use  Counseled on cessation  Right ulnar neuropathy vs cervical radiculopathy.  Chronic, patient has history of neck pain as well , needs to followup with ortho outpatient as scheduled.    Code Status: full Family Communication: None at bedside Disposition Plan: To home when medically stable   Consultants:  none  Procedures:  none  Antibiotics:  Vancomycin and cefepime started on 12/28   HPI/Subjective: Patient states she is breathing better today, decreased cough.  Objective: Filed Vitals:   10/06/13 0618  BP: 117/65  Pulse: 83  Temp: 97.8 F (36.6 C)  Resp: 20    Intake/Output Summary (Last 24 hours) at 10/06/13 1156 Last data filed at 10/06/13 0500  Gross per 24 hour  Intake      0 ml  Output   1200 ml  Net  -1200 ml   Filed Weights   10/05/13 2022  Weight: 78.5 kg (173 lb 1 oz)    Exam:  General: alert & oriented x3  In NAD Cardiovascular: RRR, nl S1 s2 Respiratory: Rhonchi at bases, right greater than left. No wheezes. Abdomen: soft +BS NT/ND, no masses palpable Extremities: No cyanosis and no edema Neuro:  Strength 5 out of 5 in all extremities, nonfocal.    Data  Reviewed: Basic Metabolic Panel:  Recent Labs Lab 10/05/13 1612 10/05/13 1713  NA 138 142  K 3.6 3.7  CL 101 103  CO2 25  --   GLUCOSE 170* 171*  BUN 5* <3*  CREATININE 0.82 0.90  CALCIUM 9.2  --    Liver Function Tests: No results found for this basename: AST, ALT, ALKPHOS, BILITOT, PROT, ALBUMIN,  in the last 168 hours No results found for this basename: LIPASE, AMYLASE,  in the last 168 hours No results found for this basename: AMMONIA,  in the last 168 hours CBC:  Recent Labs Lab 10/05/13 1612 10/05/13 1713  WBC 11.1*  --   NEUTROABS 9.3*  --   HGB 14.4 15.6*  HCT 43.6 46.0  MCV 93.6  --   PLT 495*  --    Cardiac Enzymes: No results found for this basename: CKTOTAL, CKMB, CKMBINDEX, TROPONINI,  in the last 168 hours BNP (last 3 results) No results found for this basename: PROBNP,  in the last 8760 hours CBG:  Recent Labs Lab 10/05/13 1142 10/05/13 2209 10/06/13 0807  GLUCAP 83 306* 203*    No results found for this or any previous visit (from the past 240 hour(s)).   Studies: Dg Chest 2 View  10/05/2013   CLINICAL DATA:  Short of breath  EXAM: CHEST  2 VIEW  COMPARISON:  07/25/2013  FINDINGS: Heart size is normal. There are patchy pulmonary infiltrates in the lower lungs, most notable in the right middle lobe but also probably in the left lower lobe. This consistent with Brock pneumonia.  No dense consolidation or collapse. No effusions. No acute bony finding.  IMPRESSION: Bronchopneumonia affecting the right middle lobe and left lower lobe. No dense consolidation.   Electronically Signed   By: Paulina Fusi M.D.   On: 10/05/2013 15:57    Scheduled Meds: . sodium chloride   Intravenous STAT  . ipratropium  0.5 mg Nebulization TID   And  . albuterol  2.5 mg Nebulization TID  . atorvastatin  20 mg Oral q1800  . ceFEPime (MAXIPIME) IV  1 g Intravenous Q8H  . enoxaparin (LOVENOX) injection  40 mg Subcutaneous Q24H  . [START ON 10/07/2013] influenza vac  split quadrivalent PF  0.5 mL Intramuscular Tomorrow-1000  . insulin aspart  0-15 Units Subcutaneous TID WC  . insulin aspart  0-5 Units Subcutaneous QHS  . methylPREDNISolone (SOLU-MEDROL) injection  60 mg Intravenous Q6H  . oseltamivir  75 mg Oral BID  . pantoprazole  40 mg Oral Daily  . [START ON 10/07/2013] pneumococcal 23 valent vaccine  0.5 mL Intramuscular Tomorrow-1000  . vancomycin  1,000 mg Intravenous Q8H   Continuous Infusions: . sodium chloride    . sodium chloride 125 mL/hr at 10/05/13 2038    Principal Problem:   HCAP (healthcare-associated pneumonia) Active Problems:   Flu-like symptoms   Asthma exacerbation   Diabetes mellitus   GERD (gastroesophageal reflux disease)   Tobacco use    Time spent: 25    [facility]  [facility] Pager [phone]. If 7PM-7AM, please contact night-coverage at [email or web address], password [facility] 10/06/2013, 11:56 AM  LOS: 1 day

## 2013-10-07 DIAGNOSIS — F17200 Nicotine dependence, unspecified, uncomplicated: Secondary | ICD-10-CM

## 2013-10-07 LAB — BASIC METABOLIC PANEL
BUN: 14 mg/dL (ref 6–23)
CO2: 22 mEq/L (ref 19–32)
Chloride: 107 mEq/L (ref 96–112)
GFR calc Af Amer: >90 mL/min (ref >90)
Glucose, Bld: 191 mg/dL — ABNORMAL HIGH (ref 70–99)
Potassium: 4.9 mEq/L (ref 3.7–5.3)
Sodium: 143 mEq/L (ref 137–147)

## 2013-10-07 LAB — GLUCOSE, CAPILLARY
Glucose-Capillary: 194 mg/dL — ABNORMAL HIGH (ref 70–99)
Glucose-Capillary: 278 mg/dL — ABNORMAL HIGH (ref 70–99)

## 2013-10-07 MED ORDER — IPRATROPIUM-ALBUTEROL 0.5-2.5 (3) MG/3ML IN SOLN: 3.0000 mL | Freq: Three times a day (TID) | RESPIRATORY_TRACT | Status: DC

## 2013-10-07 MED ORDER — LEVOFLOXACIN 750 MG PO TABS: 750.0000 mg | ORAL_TABLET | Freq: Every day | ORAL | Status: DC

## 2013-10-07 MED ORDER — ALBUTEROL SULFATE (2.5 MG/3ML) 0.083% IN NEBU: 2.5000 mg | INHALATION_SOLUTION | Freq: Three times a day (TID) | RESPIRATORY_TRACT | Status: DC

## 2013-10-07 MED ORDER — PREDNISONE 20 MG PO TABS: 40.0000 mg | ORAL_TABLET | Freq: Every day | ORAL | Status: DC

## 2013-10-07 NOTE — Discharge Summary (Signed)
Physician Discharge Summary  [name] ZOX:[id number] DOB: 11-18-1965 DOA: 10/05/2013  PCP: Default, Provider, MD  Admit date: 10/05/2013 Discharge date: 10/07/2013  Time spent: >30 minutes  Recommendations for Outpatient Follow-up:    Discharge Diagnoses:  Principal Problem:   HCAP (healthcare-associated pneumonia) Active Problems:   Flu-like symptoms   Asthma exacerbation   Diabetes mellitus   GERD (gastroesophageal reflux disease)   Tobacco use   Discharge Condition: Improved/stable  Diet recommendation: Modified carbohydrate  Filed Weights   10/05/13 2022  Weight: 78.5 kg (173 lb 1 oz)    History of present illness:  47 year old female with history of diabetes mellitus, hyperlipidemia, asthma, active smoker, recent [place] in [place] who presented with one-week history of-like symptoms. She reports having nausea, subjective fever with chills, generalized body ache, whose bowel movements, cough with nasal congestion, fatigue and loss of appetite. Today she developed numbness and tingling on the right fourth and fifth digits and the ulnar border of the right handed. She reports that she has had these symptoms chronically secondary to her neck pain but since he had flulike symptoms as well she decided to come to the ED.  She reports having productive cough with yellowish phlegm. She reports using albuterol inhaler at home without much relief. Patient reports her husband is also sick with COPD exacerbation. She was admitted for further evaluation and management   Hospital Course:  HCAP (healthcare-associated pneumonia)  - As discussed above upon admission patient was placed on empiric antibiotics with IV vancomycin and cefepime. Blood cultures to date show no growth -strep antigen negative, Legionella antigen negative and HIV nonreactive -Counseled on smoking cessation on admission -She improved clinically and will be discharged at this time on oral  antibiotics to follow up outpatient.  Active Problems:  Flu-like symptoms  -influenza panel negative, DC'ed Tamiflu on 12/29.  Asthma exacerbation  -Patient was placed on IV solumedrol on admission and treated with nebulized bronchodilators. She has improved clinically and will be discharged on oral prednisone as well as antibiotics for pneumonia as above. Diabetes mellitus  She is to continue her outpatient medications upon discharge. GERD (gastroesophageal reflux disease)  Continue PPI  Tobacco use  Counseled on cessation  Probable cervical radiculopathy.  Chronic, patient has history of neck pain as well , needs to followup with ortho outpatient as scheduled.      Procedures:  none  Consultations:  none  Discharge Exam: Filed Vitals:   10/07/13 0625  BP: 171/80  Pulse: 70  Temp: 97.5 F (36.4 C)  Resp: 17   Exam:  General: alert & oriented x3 In NAD  Cardiovascular: RRR, nl S1 s2  Respiratory: Few rhonchi, no wheezes.  Abdomen: soft +BS NT/ND, no masses palpable  Extremities: No cyanosis and no edema  Neuro: Strength 5 out of 5 in all extremities, nonfocal.   Discharge Instructions  Discharge Orders   Future Orders Complete By Expires   Diet Carb Modified  As directed    Increase activity slowly  As directed        Medication List         albuterol 108 (90 BASE) MCG/ACT inhaler  Commonly known as:  PROVENTIL HFA;VENTOLIN HFA  Inhale 2 puffs into the lungs every 6 (six) hours as needed for wheezing.     diazepam 5 MG tablet  Commonly known as:  VALIUM  Take 5 mg by mouth every 8 (eight) hours as needed for anxiety.     HYDROcodone-acetaminophen 7.5-325 MG  per tablet  Commonly known as:  NORCO  Take 1 tablet by mouth every 4 (four) hours as needed for pain.     JANUMET XR 50-500 MG Tb24  Generic drug:  SitaGLIPtin-MetFORMIN HCl  Take by mouth 2 (two) times daily.     levofloxacin 750 MG tablet  Commonly known as:  LEVAQUIN  Take 1 tablet (750  mg total) by mouth daily.     NYQUIL PO  Take 30 mLs by mouth once.     omeprazole 40 MG capsule  Commonly known as:  PRILOSEC  Take 40 mg by mouth daily.     predniSONE 20 MG tablet  Commonly known as:  DELTASONE  Take 2 tablets (40 mg total) by mouth daily with breakfast.     rosuvastatin 10 MG tablet  Commonly known as:  CRESTOR  Take 10 mg by mouth daily.     THERAFLU FLU/COLD PO  Take 1 packet by mouth once.       No Known Allergies    The results of significant diagnostics from this hospitalization (including imaging, microbiology, ancillary and laboratory) are listed below for reference.    Significant Diagnostic Studies: Dg Chest 2 View  10/05/2013   CLINICAL DATA:  Short of breath  EXAM: CHEST  2 VIEW  COMPARISON:  07/25/2013  FINDINGS: Heart size is normal. There are patchy pulmonary infiltrates in the lower lungs, most notable in the right middle lobe but also probably in the left lower lobe. This consistent with Brock pneumonia. No dense consolidation or collapse. No effusions. No acute bony finding.  IMPRESSION: Bronchopneumonia affecting the right middle lobe and left lower lobe. No dense consolidation.   Electronically Signed   By: Paulina Fusi M.D.   On: 10/05/2013 15:57    Microbiology: Recent Results (from the past 240 hour(s))  CULTURE, BLOOD (ROUTINE X 2)     Status: None   Collection Time    10/05/13 11:15 PM      Result Value Range Status   Specimen Description BLOOD RIGHT ARM   Final   Special Requests BOTTLES DRAWN AEROBIC ONLY 10CC   Final   Culture  Setup Time     Final   Value: 10/06/2013 09:14     Performed at [facility]   Culture     Final   Value:        BLOOD CULTURE RECEIVED NO GROWTH TO DATE CULTURE WILL BE HELD FOR 5 DAYS BEFORE ISSUING A FINAL NEGATIVE REPORT     Performed at [facility]   Report Status PENDING   Incomplete  CULTURE, BLOOD (ROUTINE X 2)     Status: None   Collection Time    10/05/13 11:20 PM       Result Value Range Status   Specimen Description BLOOD RIGHT HAND   Final   Special Requests BOTTLES DRAWN AEROBIC ONLY 10CC   Final   Culture  Setup Time     Final   Value: 10/06/2013 09:14     Performed at [facility]   Culture     Final   Value:        BLOOD CULTURE RECEIVED NO GROWTH TO DATE CULTURE WILL BE HELD FOR 5 DAYS BEFORE ISSUING A FINAL NEGATIVE REPORT     Performed at [facility]   Report Status PENDING   Incomplete     Labs: Basic Metabolic Panel:  Recent Labs Lab 10/05/13 1612 10/05/13 1713 10/07/13 0810  NA 138 142 143  K 3.6 3.7 4.9  CL 101 103 107  CO2 25  --  22  GLUCOSE 170* 171* 191*  BUN 5* <3* 14  CREATININE 0.82 0.90 0.81  CALCIUM 9.2  --  9.4   Liver Function Tests: No results found for this basename: AST, ALT, ALKPHOS, BILITOT, PROT, ALBUMIN,  in the last 168 hours No results found for this basename: LIPASE, AMYLASE,  in the last 168 hours No results found for this basename: AMMONIA,  in the last 168 hours CBC:  Recent Labs Lab 10/05/13 1612 10/05/13 1713  WBC 11.1*  --   NEUTROABS 9.3*  --   HGB 14.4 15.6*  HCT 43.6 46.0  MCV 93.6  --   PLT 495*  --    Cardiac Enzymes: No results found for this basename: CKTOTAL, CKMB, CKMBINDEX, TROPONINI,  in the last 168 hours BNP: BNP (last 3 results) No results found for this basename: PROBNP,  in the last 8760 hours CBG:  Recent Labs Lab 10/06/13 0807 10/06/13 1208 10/06/13 1658 10/06/13 2148 10/07/13 0809  GLUCAP 203* 217* 165* 258* 194*       Signed:  Tyri Elmore C  [facility] 10/07/2013, 11:29 AM

## 2013-10-08 NOTE — ED Provider Notes (Signed)
Medical screening examination/treatment/procedure(s) were conducted as a shared visit with non-physician practitioner(s) and myself.  I personally evaluated the patient during the encounter. Please see my previous notes.     Laray Anger, DO 10/08/13 [phone]

## 2013-10-08 NOTE — ED Provider Notes (Signed)
Medical screening examination/treatment/procedure(s) were conducted as a shared visit with non-physician practitioner(s) and myself.  I personally evaluated the patient during the encounter.  47yo F, c/o cough, runny/stuffy nose, generalized fatigue x1 week. Has been associated with subjective home fevers and several watery stools today. States she has also had acute flair of her chronic neck pain and right hand ulnar neuropathy.  Pt began wheezing while in the ED. Short neb and IV solumedrol given. VSS, NAD, A&O, lungs continue coarse with insp/exp wheezing bilat after short neb, RRR, abd soft/NT, neuro non-focal.  Pt ambulatory around the ED with Sats dropping to 80's% R/A with increasing RR and pt c/o increasing SOB. CXR with multilobar pneumonia. Will start hour long neb, IV abx, admit.     CRITICAL CARE Performed by: Laray Anger Total critical care time: 35 Critical care time was exclusive of separately billable procedures and treating other patients. Critical care was necessary to treat or prevent imminent or life-threatening deterioration. Critical care was time spent personally by me on the following activities: development of treatment plan with patient and/or surrogate as well as nursing, discussions with consultants, evaluation of patient's response to treatment, examination of patient, obtaining history from patient or surrogate, ordering and performing treatments and interventions, ordering and review of laboratory studies, ordering and review of radiographic studies, pulse oximetry and re-evaluation of patient's condition.   EKG Interpretation    Date/Time:  Sunday October 05 2013 11:46:18 EST Ventricular Rate:  80 PR Interval:  147 QRS Duration: 79 QT Interval:  415 QTC Calculation: 479 R Axis:   74 Text Interpretation:  Sinus rhythm Artifact Baseline wander ST elevation, consider early repolarization When compared with ECG of 07/22/2013 No significant change was found  Confirmed by [facility]  MD, Raelin Pixler [id number]) on 10/05/2013 11:54:59 AM              Laray Anger, DO 10/08/13 1443

## 2013-10-12 LAB — CULTURE, BLOOD (ROUTINE X 2)
Culture: NO GROWTH
Culture: NO GROWTH

## 2013-11-05 ENCOUNTER — Ambulatory Visit: Payer: Medicaid Other | Admitting: Physical Therapy

## 2013-11-10 ENCOUNTER — Ambulatory Visit: Payer: Medicaid Other | Attending: Orthopedic Surgery | Admitting: Physical Therapy

## 2013-11-10 DIAGNOSIS — M545 Low back pain, unspecified: Secondary | ICD-10-CM | POA: Insufficient documentation

## 2013-11-10 DIAGNOSIS — M2550 Pain in unspecified joint: Secondary | ICD-10-CM | POA: Insufficient documentation

## 2013-11-10 DIAGNOSIS — R293 Abnormal posture: Secondary | ICD-10-CM | POA: Insufficient documentation

## 2013-11-10 DIAGNOSIS — IMO0001 Reserved for inherently not codable concepts without codable children: Secondary | ICD-10-CM | POA: Insufficient documentation

## 2013-11-10 DIAGNOSIS — M542 Cervicalgia: Secondary | ICD-10-CM | POA: Insufficient documentation

## 2013-12-12 ENCOUNTER — Encounter (HOSPITAL_BASED_OUTPATIENT_CLINIC_OR_DEPARTMENT_OTHER): Payer: Self-pay | Admitting: *Deleted

## 2013-12-12 ENCOUNTER — Other Ambulatory Visit: Payer: Self-pay | Admitting: Orthopedic Surgery

## 2013-12-12 NOTE — Progress Notes (Signed)
Pt diabetic-asthma-had pneumonia 12/14-still coughs and sounds congested-just finished antibiotic Will need istat-had ekg 1/15

## 2013-12-14 NOTE — H&P (Signed)
[name] is an 48 y.o. female.   CC / Reason for Visit: Right hand burning pain HPI: This patient is a 48 year old female with a history of diabetes who is now just over 4 months following left L3-4 XLIF with posterior instrumentation for spinal stenosis, who reports a 2+ month history of burning pain in the right hand, specifically the ring and small fingers and ulnar border of the hand.  She reports that she has difficulty sleeping at night because of this.  She continues to take gabapentin which is only minimally helpful.  Electrodiagnostic studies performed on 11-19-13 reveals right ulnar neuropathy at the elbow and right median neuropathy at the wrist.  There was no evidence for right cervical radiculopathy.  By report, she has an MRI of the cervical spine with a focal right 5-6 protrusion that is asymptomatic.  Past Medical History  Diagnosis Date  . Diabetes mellitus without complication   . Hyperlipidemia   . Heart murmur     mild per patient  . GERD (gastroesophageal reflux disease)   . Wears partial dentures     top and bottom partials  . Asthma   . Pneumonia 12/14    Past Surgical History  Procedure Laterality Date  . Appendectomy  30- 40 years  . Anterior lat lumbar fusion Left 07/31/2013    Procedure: ANTERIOR LATERAL LUMBAR FUSION 1 LEVEL/Left sided lumbar 3-4 lateral interbody fusion with instrumentation and allograft.;  Surgeon: Sinclair Ship, MD;  Location: [place];  Service: Orthopedics;  Laterality: Left;  Left sided lumbar 3-4 lateral interbody fusion with instrumentation and allograft.    History reviewed. No pertinent family history. Social History:  reports that she quit smoking about 3 months ago. She does not have any smokeless tobacco history on file. She reports that she drinks alcohol. She reports that she does not use illicit drugs.  Allergies: No Known Allergies  No prescriptions prior to admission    No results found for this or any previous  visit (from the past 48 hour(s)). No results found.  Review of Systems  All other systems reviewed and are negative.    Height 5\' 3"  (1.6 m), weight 78.019 kg (172 lb), last menstrual period 03/07/2012. Physical Exam  Constitutional:  WD, WN, NAD HEENT:  NCAT, EOMI Neuro/Psych:  Alert & oriented to person, place, and time; appropriate mood & affect Lymphatic: No generalized UE edema or lymphadenopathy Extremities / MSK:  Both UE are normal with respect to appearance, ranges of motion, joint stability, muscle strength/tone, sensation, & perfusion except as otherwise noted:  Elbow flexion testing on the right side intensifies or burning.  She has a Tinel sign over the ulnar nerve in the region where it enters the FCU.  She has altered light touch sensibility in the ring and small fingers without any splitting, including the ulnar border of the hand both volarly and dorsally, and weak FDP #4 and 5 and interosseus muscles.  She has some dysvascular changes to the tips of the long finger, index finger, and thumb.  Radial pulses palpable.  She can detect a 4.3 1 monofilament in the median distribution and barely detect a 6.6 5 monofilament in the ring finger and small finger.  Labs / Xrays:  No radiographic studies obtained today.  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  She understands that this is largely to protect and preserve the nerve function that remains and hopefully set the stage for some degree of recovery.  We will likely proceed next week.  The details of the operative procedure were discussed with the patient.  Questions were inviConstitutional:  WD, WN, NAD HEENT:  NCAT, EOMI Neuro/Psych:  Alert & oriented to person, place, and time; appropriate mood & affect Lymphatic: No generalized UE edema or  lymphadenopathy Extremities / MSK:  Both UE are normal with respect to appearance, ranges of motion, joint stability, muscle strength/tone, sensation, & perfusion except as otherwise noted:  Elbow flexion testing on the right side intensifies or burning.  She has a Tinel sign over the ulnar nerve in the region where it enters the FCU.  She has altered light touch sensibility in the ring and small fingers without any splitting, including the ulnar border of the hand both volarly and dorsally, and weak FDP #4 and 5 and interosseus muscles.  She has some dysvascular changes to the tips of the long finger, index finger, and thumb.  Radial pulses palpable.  She can detect a 4.3 1 monofilament in the median distribution and barely detect a 6.6 5 monofilament in the ring finger and small finger.  Labs / Xrays:  No radiographic studies obtained today.  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  She understands that this is largely to protect and preserve the nerve function that remains and hopefully set the stage for some degree of recovery.  We will likely proceed next week.  The details of the operative procedure were discussed with the patient.  Questions were invited and answered.  In addition to the goal of the procedure, the risks of the procedure to include but not limited to bleeding; infection; damage to the nerves or blood vessels that could result in bleeding, numbness, weakness, chronic pain, and the need for additional procedures; stiffness; the need for revision surgery; and anesthetic risks, the worst of which is death, were reviewed.  No specific outcome was guaranteed or implied.  Informed consent was obtained.  ted and answered.  In addition to the goal of the procedure, the risks of the procedure to include but not  limited to bleeding; infection; damage to the nerves or blood vessels that could result in bleeding, numbness, weakness, chronic pain, and the need for additional procedures; stiffness; the need for revision surgery; and anesthetic risks, the worst of which is death, were reviewed.  No specific outcome was guaranteed or implied.  Informed consent was obtained.    [name] A. 12/14/2013, 9:59 AM

## 2013-12-15 ENCOUNTER — Encounter (HOSPITAL_BASED_OUTPATIENT_CLINIC_OR_DEPARTMENT_OTHER)
Admission: RE | Disposition: A | Discharge status: Home / Self Care | Payer: Self-pay | Source: Ambulatory Visit | Attending: Orthopedic Surgery

## 2013-12-15 ENCOUNTER — Encounter (HOSPITAL_BASED_OUTPATIENT_CLINIC_OR_DEPARTMENT_OTHER): Payer: Medicaid Other | Admitting: Anesthesiology

## 2013-12-15 ENCOUNTER — Ambulatory Visit (HOSPITAL_BASED_OUTPATIENT_CLINIC_OR_DEPARTMENT_OTHER): Payer: Medicaid Other | Admitting: Anesthesiology

## 2013-12-15 ENCOUNTER — Ambulatory Visit (HOSPITAL_BASED_OUTPATIENT_CLINIC_OR_DEPARTMENT_OTHER)
Admission: RE | Admit: 2013-12-15 | Discharge: 2013-12-15 | Disposition: A | Discharge status: Home / Self Care | Payer: Medicaid Other | Source: Ambulatory Visit | Attending: Orthopedic Surgery | Admitting: Orthopedic Surgery

## 2013-12-15 ENCOUNTER — Encounter (HOSPITAL_BASED_OUTPATIENT_CLINIC_OR_DEPARTMENT_OTHER): Payer: Self-pay | Admitting: *Deleted

## 2013-12-15 DIAGNOSIS — M79609 Pain in unspecified limb: Secondary | ICD-10-CM | POA: Insufficient documentation

## 2013-12-15 DIAGNOSIS — E785 Hyperlipidemia, unspecified: Secondary | ICD-10-CM | POA: Insufficient documentation

## 2013-12-15 DIAGNOSIS — G5680 Other specified mononeuropathies of unspecified upper limb: Secondary | ICD-10-CM | POA: Insufficient documentation

## 2013-12-15 DIAGNOSIS — R011 Cardiac murmur, unspecified: Secondary | ICD-10-CM | POA: Insufficient documentation

## 2013-12-15 DIAGNOSIS — Z981 Arthrodesis status: Secondary | ICD-10-CM | POA: Insufficient documentation

## 2013-12-15 DIAGNOSIS — Z87891 Personal history of nicotine dependence: Secondary | ICD-10-CM | POA: Insufficient documentation

## 2013-12-15 DIAGNOSIS — E119 Type 2 diabetes mellitus without complications: Secondary | ICD-10-CM | POA: Insufficient documentation

## 2013-12-15 DIAGNOSIS — J45909 Unspecified asthma, uncomplicated: Secondary | ICD-10-CM | POA: Insufficient documentation

## 2013-12-15 DIAGNOSIS — K219 Gastro-esophageal reflux disease without esophagitis: Secondary | ICD-10-CM | POA: Insufficient documentation

## 2013-12-15 HISTORY — PX: CARPAL TUNNEL RELEASE: SHX101

## 2013-12-15 HISTORY — DX: Unspecified asthma, uncomplicated: J45.909

## 2013-12-15 HISTORY — DX: Pneumonia, unspecified organism: J18.9

## 2013-12-15 HISTORY — DX: Presence of dental prosthetic device (complete) (partial): Z97.2

## 2013-12-15 HISTORY — PX: LATERAL EPICONDYLE RELEASE: SHX1958

## 2013-12-15 LAB — POCT I-STAT, CHEM 8
BUN: 10 mg/dL (ref 6–23)
CALCIUM ION: 1.27 mmol/L — AB (ref 1.12–1.23)
CREATININE: 0.9 mg/dL (ref 0.50–1.10)
Chloride: 104 mEq/L (ref 96–112)
GLUCOSE: 156 mg/dL — AB (ref 70–99)
HEMATOCRIT: 48 % — AB (ref 36.0–46.0)
HEMOGLOBIN: 16.3 g/dL — AB (ref 12.0–15.0)
Potassium: 4.3 mEq/L (ref 3.7–5.3)
Sodium: 142 mEq/L (ref 137–147)
TCO2: 26 mmol/L (ref 0–100)

## 2013-12-15 LAB — GLUCOSE, CAPILLARY: GLUCOSE-CAPILLARY: 152 mg/dL — AB (ref 70–99)

## 2013-12-15 SURGERY — TENNIS ELBOW RELEASE/NIRSCHEL PROCEDURE
Anesthesia: General | Site: Wrist | Laterality: Right

## 2013-12-15 MED ORDER — OXYCODONE HCL 5 MG/5ML PO SOLN: 5.0000 mg | Freq: Once | ORAL | Status: DC | PRN

## 2013-12-15 MED ORDER — HYDROMORPHONE HCL PF 1 MG/ML IJ SOLN
INTRAMUSCULAR | Status: AC
  Filled 2013-12-15: qty 1

## 2013-12-15 MED ORDER — LIDOCAINE HCL (CARDIAC) 20 MG/ML IV SOLN
INTRAVENOUS | Status: DC | PRN
  Administered 2013-12-15: 80 mg via INTRAVENOUS

## 2013-12-15 MED ORDER — CHLORHEXIDINE GLUCONATE 4 % EX LIQD: 60.0000 mL | Freq: Once | CUTANEOUS | Status: DC

## 2013-12-15 MED ORDER — HYDROMORPHONE HCL PF 1 MG/ML IJ SOLN
0.2500 mg | INTRAMUSCULAR | Status: DC | PRN
  Administered 2013-12-15 (×4): 0.5 mg via INTRAVENOUS

## 2013-12-15 MED ORDER — PROPOFOL 10 MG/ML IV BOLUS
INTRAVENOUS | Status: DC | PRN
  Administered 2013-12-15: 150 mg via INTRAVENOUS
  Administered 2013-12-15: 50 mg via INTRAVENOUS

## 2013-12-15 MED ORDER — OXYCODONE-ACETAMINOPHEN 5-325 MG PO TABS: 1.0000 | ORAL_TABLET | ORAL | Status: DC | PRN

## 2013-12-15 MED ORDER — CEFAZOLIN SODIUM-DEXTROSE 2-3 GM-% IV SOLR
INTRAVENOUS | Status: AC
  Filled 2013-12-15: qty 50

## 2013-12-15 MED ORDER — FENTANYL CITRATE 0.05 MG/ML IJ SOLN
INTRAMUSCULAR | Status: DC | PRN
  Administered 2013-12-15: 25 ug via INTRAVENOUS
  Administered 2013-12-15: 100 ug via INTRAVENOUS
  Administered 2013-12-15: 25 ug via INTRAVENOUS

## 2013-12-15 MED ORDER — ONDANSETRON HCL 4 MG/2ML IJ SOLN: 4.0000 mg | Freq: Once | INTRAMUSCULAR | Status: DC | PRN

## 2013-12-15 MED ORDER — ONDANSETRON HCL 4 MG/2ML IJ SOLN
INTRAMUSCULAR | Status: DC | PRN
  Administered 2013-12-15: 4 mg via INTRAVENOUS

## 2013-12-15 MED ORDER — LACTATED RINGERS IV SOLN
INTRAVENOUS | Status: DC
Start: 2013-12-15 — End: 2013-12-15
  Administered 2013-12-15: 08:00:00 via INTRAVENOUS

## 2013-12-15 MED ORDER — MIDAZOLAM HCL 5 MG/5ML IJ SOLN
INTRAMUSCULAR | Status: DC | PRN
  Administered 2013-12-15: 2 mg via INTRAVENOUS

## 2013-12-15 MED ORDER — OXYCODONE HCL 5 MG PO TABS: 5.0000 mg | ORAL_TABLET | Freq: Once | ORAL | Status: DC | PRN

## 2013-12-15 MED ORDER — BUPIVACAINE HCL 0.5 % IJ SOLN
INTRAMUSCULAR | Status: DC | PRN
  Administered 2013-12-15: 20 mL

## 2013-12-15 MED ORDER — CEFAZOLIN SODIUM-DEXTROSE 2-3 GM-% IV SOLR
2.0000 g | INTRAVENOUS | Status: AC
  Administered 2013-12-15: 2 g via INTRAVENOUS

## 2013-12-15 MED ORDER — FENTANYL CITRATE 0.05 MG/ML IJ SOLN
INTRAMUSCULAR | Status: AC
  Filled 2013-12-15: qty 2

## 2013-12-15 SURGICAL SUPPLY — 41 items
APPLICATOR COTTON TIP 6IN STRL (MISCELLANEOUS) ×4 IMPLANT
BLADE HOOK ENDO STRL (BLADE) ×4 IMPLANT
BLADE SURG 15 STRL LF DISP TIS (BLADE) ×2 IMPLANT
BLADE SURG 15 STRL SS (BLADE) ×2
BLADE TRIANGLE EPF/EGR ENDO (BLADE) ×4 IMPLANT
BNDG COHESIVE 4X5 TAN STRL (GAUZE/BANDAGES/DRESSINGS) ×4 IMPLANT
BNDG ESMARK 4X9 LF (GAUZE/BANDAGES/DRESSINGS) ×4 IMPLANT
BNDG GAUZE ELAST 4 BULKY (GAUZE/BANDAGES/DRESSINGS) ×8 IMPLANT
CHLORAPREP W/TINT 26ML (MISCELLANEOUS) ×4 IMPLANT
CORDS BIPOLAR (ELECTRODE) ×4 IMPLANT
COVER MAYO STAND STRL (DRAPES) ×4 IMPLANT
COVER TABLE BACK 60X90 (DRAPES) ×4 IMPLANT
CUFF TOURNIQUET SINGLE 18IN (TOURNIQUET CUFF) ×4 IMPLANT
DRAIN TLS ROUND 10FR (DRAIN) ×4 IMPLANT
DRAPE EXTREMITY T 121X128X90 (DRAPE) ×4 IMPLANT
DRAPE SURG 17X23 STRL (DRAPES) ×4 IMPLANT
DRSG EMULSION OIL 3X3 NADH (GAUZE/BANDAGES/DRESSINGS) ×8 IMPLANT
GLOVE BIO SURGEON STRL SZ7.5 (GLOVE) ×4 IMPLANT
GLOVE BIOGEL PI IND STRL 7.0 (GLOVE) ×2 IMPLANT
GLOVE BIOGEL PI IND STRL 8 (GLOVE) ×2 IMPLANT
GLOVE BIOGEL PI INDICATOR 7.0 (GLOVE) ×2
GLOVE BIOGEL PI INDICATOR 8 (GLOVE) ×2
GLOVE ECLIPSE 6.5 STRL STRAW (GLOVE) ×4 IMPLANT
GOWN STRL REUS W/ TWL LRG LVL3 (GOWN DISPOSABLE) ×4 IMPLANT
GOWN STRL REUS W/TWL LRG LVL3 (GOWN DISPOSABLE) ×4
NEEDLE HYPO 25X1 1.5 SAFETY (NEEDLE) ×4 IMPLANT
NS IRRIG 1000ML POUR BTL (IV SOLUTION) ×4 IMPLANT
PACK BASIN DAY SURGERY FS (CUSTOM PROCEDURE TRAY) ×4 IMPLANT
PADDING CAST ABS 4INX4YD NS (CAST SUPPLIES) ×2
PADDING CAST ABS COTTON 4X4 ST (CAST SUPPLIES) ×2 IMPLANT
SPONGE GAUZE 4X4 12PLY (GAUZE/BANDAGES/DRESSINGS) ×4 IMPLANT
STOCKINETTE 4X48 STRL (DRAPES) ×4 IMPLANT
SUT VIC AB 3-0 FS2 27 (SUTURE) ×4 IMPLANT
SUT VICRYL RAPIDE 4-0 (SUTURE) ×4 IMPLANT
SUT VICRYL RAPIDE 4/0 PS 2 (SUTURE) ×4 IMPLANT
SYR BULB 3OZ (MISCELLANEOUS) ×4 IMPLANT
SYRINGE 10CC LL (SYRINGE) ×4 IMPLANT
SYSTEM CHEST DRAIN TLS 7FR (DRAIN) IMPLANT
TOWEL OR 17X24 6PK STRL BLUE (TOWEL DISPOSABLE) ×8 IMPLANT
TOWEL OR NON WOVEN STRL DISP B (DISPOSABLE) ×4 IMPLANT
UNDERPAD 30X30 INCONTINENT (UNDERPADS AND DIAPERS) ×4 IMPLANT

## 2013-12-15 NOTE — Discharge Instructions (Signed)
Discharge Instructions   You have a light dressing on your hand.  You may begin gentle motion of your fingers and hand immediately, but you should not do any heavy lifting or gripping.  Elevate your hand to reduce pain & swelling of the digits.  Ice over the operative site may be helpful to reduce pain & swelling.  DO NOT USE HEAT. Pain medicine has been prescribed for you.  Use your medicine as needed over the first 48 hours, and then you can begin to taper your use. You may use Tylenol in place of your prescribed pain medication, but not IN ADDITION to it. Leave the dressing in place until the third day after your surgery and then remove it, leaving it open to air.  After the bandage has been removed you may shower, but do not soak the incision.  You may drive a car when you are off of prescription pain medications and can safely control your vehicle with both hands. We will address whether therapy will be required or not when you return to the office. You may have already made your follow-up appointment when we completed your preop visit.  If not, please call our office today or the next business day to make your return appointment for 10-15 days after surgery.   Please call [phone] during normal business hours or [phone] after hours for any problems. Including the following:  - excessive redness of the incisions - drainage for more than 4 days - fever of more than 101.5 F  *Please note that pain medications will not be refilled after hours or on weekends.    Post Anesthesia Home Care Instructions  Activity: Get plenty of rest for the remainder of the day. A responsible adult should stay with you for 24 hours following the procedure.  For the next 24 hours, DO NOT: -Drive a car -Paediatric nurse -Drink alcoholic beverages -Take any medication unless instructed by your physician -Make any legal decisions or sign important papers.  Meals: Start with liquid foods such  as gelatin or soup. Progress to regular foods as tolerated. Avoid greasy, spicy, heavy foods. If nausea and/or vomiting occur, drink only clear liquids until the nausea and/or vomiting subsides. Call your physician if vomiting continues.  Special Instructions/Symptoms: Your throat may feel dry or sore from the anesthesia or the breathing tube placed in your throat during surgery. If this causes discomfort, gargle with warm salt water. The discomfort should disappear within 24 hours. TLS Drain Instructions You have a drain tube in place to help limit the amount of blood that collects under your skin in the early post-operative period.  The amount it drains will vary from person-to-person and is dependent upon many factors.  You will have 1-2 extra drain tubes sent with you from the facility.  You should change the tube according to these instructions below when the tube is about half full.  BE SURE TO SLIDE THE CLAMP TO THE CLOSED POSITION BEFORE CHANGING THE TUBE.    RE-OPEN THE CLAMP ONCE THE GLASS EVACUATION TUBE HAS BEEN CHANGED.  24 hours after surgery the amount of drainage will likely be negligible and it will be time to remove the tube and discard it.  Simply remove the tape that secures the flexible plastic drainage tube to the bandage and briskly pull the tube straight out.  You will likely feel a little discomfort during this process, but the tube should slide out as it is not sewn or otherwise secured  directly to your body.  It is merely held in place by the bandage itself.                              If you are not clear about when your first post-op appointment is scheduled, please call the office on the next business day to inquire about it.  Please also call the office if you have any other questions [phone]).

## 2013-12-15 NOTE — Anesthesia Preprocedure Evaluation (Signed)
Anesthesia Evaluation  Patient identified by MRN, date of birth, ID band Patient awake    Reviewed: Allergy & Precautions, H&P , NPO status , Patient's Chart, lab work & pertinent test results  Airway Mallampati: I TM Distance: >3 FB Neck ROM: Full    Dental  (+) Partial Lower, Partial Upper, Dental Advisory Given   Pulmonary asthma , former smoker,  breath sounds clear to auscultation        Cardiovascular Rhythm:Regular Rate:Normal     Neuro/Psych    GI/Hepatic GERD-  Medicated and Controlled,  Endo/Other  diabetes, Well Controlled, Type 2, Oral Hypoglycemic Agents  Renal/GU      Musculoskeletal   Abdominal   Peds  Hematology   Anesthesia Other Findings   Reproductive/Obstetrics                           Anesthesia Physical Anesthesia Plan  ASA: II  Anesthesia Plan: General   Post-op Pain Management:    Induction: Intravenous  Airway Management Planned: LMA  Additional Equipment:   Intra-op Plan:   Post-operative Plan: Extubation in OR  Informed Consent: I have reviewed the patients History and Physical, chart, labs and discussed the procedure including the risks, benefits and alternatives for the proposed anesthesia with the patient or authorized representative who has indicated his/her understanding and acceptance.   Dental advisory given  Plan Discussed with: CRNA, Anesthesiologist and Surgeon  Anesthesia Plan Comments:         Anesthesia Quick Evaluation

## 2013-12-15 NOTE — Anesthesia Procedure Notes (Signed)
Procedure Name: LMA Insertion Date/Time: 12/15/2013 9:41 AM Performed by: Lyndee Leo Pre-anesthesia Checklist: Patient identified, Emergency Drugs available, Suction available and Patient being monitored Patient Re-evaluated:Patient Re-evaluated prior to inductionOxygen Delivery Method: Circle System Utilized Preoxygenation: Pre-oxygenation with 100% oxygen Intubation Type: IV induction Ventilation: Mask ventilation without difficulty LMA: LMA inserted LMA Size: 4.0 Number of attempts: 1 Airway Equipment and Method: bite block Placement Confirmation: positive ETCO2 Tube secured with: Tape Dental Injury: Teeth and Oropharynx as per pre-operative assessment

## 2013-12-15 NOTE — Op Note (Signed)
12/15/2013  10:48 AM  PATIENT:  [name]  48 y.o. female  PRE-OPERATIVE DIAGNOSIS:  RIGHT ULNAR AND MEDIAN NEUROPATHIES  POST-OPERATIVE DIAGNOSIS:  Same  PROCEDURE:  Procedure(s): RIGHT ULNAR NEUROPLASTY AT ELBOW --decompression in situ RIGHT CARPAL TUNNEL RELEASE ENDOSCOPIC  SURGEON:  Surgeon(s): Jolyn Nap, MD  PHYSICIAN ASSISTANT: None  ANESTHESIA:  Gen.  SPECIMENS:  None  DRAINS:   None  PREOPERATIVE INDICATIONS:  [name] is a  48 y.o. female with a diagnosis of [place] who failed conservative measures and elected for surgical management.    The risks benefits and alternatives were discussed with the patient preoperatively including but not limited to the risks of infection, bleeding, nerve injury, cardiopulmonary complications, the need for revision surgery, among others, and the patient verbalized understanding and consented to proceed.  OPERATIVE IMPLANTS: None  OPERATIVE PROCEDURE:  After receiving prophylactic antibiotics, the patient was escorted to the operative theatre and placed in a supine position.  General anesthesia was administered. A surgical "time-out" was performed during which the planned procedure, proposed operative site, and the correct patient identity were compared to the operative consent and agreement confirmed by the circulating nurse according to current facility policy.  The exposed skin was prepped with Chloraprep and draped in the usual sterile fashion. A sterile tourniquet was applied. The limb was exsanguinated with an Esmarch bandage and the tourniquet inflated to approximately 148mmHg higher than systolic BP.   The incisions were made sharply. Subcutaneous tissues were dissected with blunt and spreading dissection. At the proximal incision, the deep forearm fascia was split in line with the skin incision the distal edge grasped with a hemostat. At the mid palmar incision, the palmar fascia was split in  line with the skin incision, revealing the underlying superficial palmar arch which was visualized with loupe assisted magnification. The synovial reflector was then introduced into the proximal incision and passed through the carpal canal, uses to reflect synovium from the deep surface of the transverse carpal ligament. It was removed and replaced with the slotted cannula and blunt obturator, passing from proximal to distal and exiting the distal wound superficial to the superficial palmar arch. The obturator was removed and the camera inserted. Visualization of the ligament was acceptable. The triangle shape blade was then inserted distally, advanced to the midportion of the ligament, and there used to create a perforation in the ligament. This instrument was removed and the hooked nstrument was inserted. It was placed into the perforation in the ligament and withdrawn distally, completing transection of the distal half of the ligament. The camera was then removed and placed into the distal end of the cannula. The hooked instrument was placed into the proximal end and advanced facility to be placed into the apex of the V. which had been formed to the distal hemi-transection of the ligament. It was withdrawn proximally, completing transection of the ligament. The adequacy of the release was judged with the scope and the instruments used as a probe. All of the endoscopic instruments are removed and the adequacy of release was again judged from the proximal incision perspective with direct loupe assisted visualization. In addition, the proximal forearm fascia was split for 2 inches proximal to the proximal incision under direct visualization using a sliding scissor technique.  Attention was then directed to decompression of the ulnar nerve were a curvilinear incision was made over the course of the nerve on the medial aspect of the elbow. It was approximately 3  inches in length. Subcutaneous tissues were dissected  with spreading dissection with care to protect and preserve the crossing cutaneous nerve structures. The deep fascia was identified in the ulnar nerve was found as it was entering the FCU. Distally with full-thickness flaps elevated for visualization, the FCU fascia was split both superficially and deep. The nerve had some irritability at this level. I ligament was also split, near its posterior edge to help create more anterior tissue to help restrain the nerve from subluxation. The nerve was decompressed proximally in the same fashion with direct visualization using 4 power loupe magnification. An accessory incision was required more proximally to fully decompress the nerve proximally.   The tourniquet was released, the wound copiously irrigated. A TLS drain was placed to lie alongside the median nerve exiting its own skin hole distally made with some sharp trocar. The skin of the hand and wrist was then closed with 4-0 Vicryl Rapide interrupted sutures. For the incision about the elbow and arm, deep dermal interrupted sutures of 3-0 Vicryl were placed, followed by running 4-0 Vicryl Rapide horizontal mattress suture. A light dressing was applied and 20 mL of half percent Marcaine without epinephrine was placed in and about the incisions, some of which also was placed down the drain.  DISPOSITION:  The patient will be discharged home today, returning in 10-15 days for re-assessment.

## 2013-12-15 NOTE — Transfer of Care (Signed)
Immediate Anesthesia Transfer of Care Note  Patient: [name]  Procedure(s) Performed: Procedure(s): RIGHT ULNAR NEUROPLASTY AT ELBOW  (Right) RIGHT CARPAL TUNNEL RELEASE ENDOSCOPIC (Right)  Patient Location: PACU  Anesthesia Type:General  Level of Consciousness: awake, sedated and patient cooperative  Airway & Oxygen Therapy: Patient Spontanous Breathing and Patient connected to face mask oxygen  Post-op Assessment: Report given to PACU RN and Post -op Vital signs reviewed and stable  Post vital signs: Reviewed and stable  Complications: No apparent anesthesia complications

## 2013-12-15 NOTE — Interval H&P Note (Signed)
History and Physical Interval Note:  12/15/2013 9:29 AM  [name]  has presented today for surgery, with the diagnosis of RIGHT ULNA AND MEDIAN NEUROPATHIES  The various methods of treatment have been discussed with the patient and family. After consideration of risks, benefits and other options for treatment, the patient has consented to  Procedure(s): RIGHT ULNAR NEUROPLASTY AT ELBOW  (Right) RIGHT CARPAL TUNNEL RELEASE ENDOSCOPIC (Right) as a surgical intervention .  The patient's history has been reviewed, patient examined, no change in status, stable for surgery.  I have reviewed the patient's chart and labs.  Questions were answered to the patient's satisfaction.     Jaclene Bartelt A.

## 2013-12-15 NOTE — Anesthesia Postprocedure Evaluation (Signed)
  Anesthesia Post-op Note  Patient: [name]  Procedure(s) Performed: Procedure(s): RIGHT ULNAR NEUROPLASTY AT ELBOW  (Right) RIGHT CARPAL TUNNEL RELEASE ENDOSCOPIC (Right)  Patient Location: PACU  Anesthesia Type:General  Level of Consciousness: awake, alert  and oriented  Airway and Oxygen Therapy: Patient Spontanous Breathing  Post-op Pain: mild  Post-op Assessment: Post-op Vital signs reviewed  Post-op Vital Signs: Reviewed  Complications: No apparent anesthesia complications

## 2013-12-17 ENCOUNTER — Encounter (HOSPITAL_BASED_OUTPATIENT_CLINIC_OR_DEPARTMENT_OTHER): Payer: Self-pay | Admitting: Orthopedic Surgery

## 2014-03-02 ENCOUNTER — Encounter (HOSPITAL_COMMUNITY): Payer: Self-pay | Admitting: Emergency Medicine

## 2014-03-02 ENCOUNTER — Emergency Department (HOSPITAL_COMMUNITY)
Admission: EM | Admit: 2014-03-02 | Discharge: 2014-03-02 | Disposition: A | Discharge status: Home / Self Care | Payer: Medicaid Other | Attending: Emergency Medicine | Admitting: Emergency Medicine

## 2014-03-02 ENCOUNTER — Emergency Department (HOSPITAL_COMMUNITY): Payer: Medicaid Other

## 2014-03-02 DIAGNOSIS — R1011 Right upper quadrant pain: Secondary | ICD-10-CM

## 2014-03-02 DIAGNOSIS — F17200 Nicotine dependence, unspecified, uncomplicated: Secondary | ICD-10-CM | POA: Insufficient documentation

## 2014-03-02 DIAGNOSIS — R110 Nausea: Secondary | ICD-10-CM | POA: Insufficient documentation

## 2014-03-02 DIAGNOSIS — E119 Type 2 diabetes mellitus without complications: Secondary | ICD-10-CM | POA: Insufficient documentation

## 2014-03-02 DIAGNOSIS — J45909 Unspecified asthma, uncomplicated: Secondary | ICD-10-CM | POA: Insufficient documentation

## 2014-03-02 DIAGNOSIS — K859 Acute pancreatitis without necrosis or infection, unspecified: Secondary | ICD-10-CM

## 2014-03-02 LAB — COMPREHENSIVE METABOLIC PANEL
ALT: 15 U/L (ref 0–35)
AST: 12 U/L (ref 0–37)
Albumin: 3.4 g/dL — ABNORMAL LOW (ref 3.5–5.2)
Alkaline Phosphatase: 87 U/L (ref 39–117)
BUN: 10 mg/dL (ref 6–23)
CALCIUM: 9.4 mg/dL (ref 8.4–10.5)
CO2: 24 meq/L (ref 19–32)
CREATININE: 0.88 mg/dL (ref 0.50–1.10)
Chloride: 99 mEq/L (ref 96–112)
GFR, EST AFRICAN AMERICAN: 88 mL/min — AB (ref >90)
GFR, EST NON AFRICAN AMERICAN: 76 mL/min — AB (ref >90)
Glucose, Bld: 218 mg/dL — ABNORMAL HIGH (ref 70–99)
Potassium: 4 mEq/L (ref 3.7–5.3)
Sodium: 136 mEq/L — ABNORMAL LOW (ref 137–147)
TOTAL PROTEIN: 7.1 g/dL (ref 6.0–8.3)
Total Bilirubin: <0.2 mg/dL — ABNORMAL LOW (ref 0.3–1.2)

## 2014-03-02 LAB — CBC WITH DIFFERENTIAL/PLATELET
Basophils Absolute: 0 10*3/uL (ref 0.0–0.1)
Basophils Relative: 0 % (ref 0–1)
Eosinophils Absolute: 0.1 10*3/uL (ref 0.0–0.7)
Eosinophils Relative: 1 % (ref 0–5)
HEMATOCRIT: 40.7 % (ref 36.0–46.0)
HEMOGLOBIN: 13.3 g/dL (ref 12.0–15.0)
Lymphocytes Relative: 20 % (ref 12–46)
Lymphs Abs: 3.3 10*3/uL (ref 0.7–4.0)
MCH: 30.4 pg (ref 26.0–34.0)
MCHC: 32.7 g/dL (ref 30.0–36.0)
MCV: 93.1 fL (ref 78.0–100.0)
MONO ABS: 1 10*3/uL (ref 0.1–1.0)
MONOS PCT: 6 % (ref 3–12)
NEUTROS ABS: 11.9 10*3/uL — AB (ref 1.7–7.7)
Neutrophils Relative %: 73 % (ref 43–77)
Platelets: 262 10*3/uL (ref 150–400)
RBC: 4.37 MIL/uL (ref 3.87–5.11)
RDW: 12.4 % (ref 11.5–15.5)
WBC: 16.4 10*3/uL — ABNORMAL HIGH (ref 4.0–10.5)

## 2014-03-02 LAB — URINALYSIS, ROUTINE W REFLEX MICROSCOPIC
Bilirubin Urine: NEGATIVE
Glucose, UA: 100 mg/dL — AB
Hgb urine dipstick: NEGATIVE
KETONES UR: NEGATIVE mg/dL
Leukocytes, UA: NEGATIVE
Nitrite: NEGATIVE
PROTEIN: NEGATIVE mg/dL
Specific Gravity, Urine: 1.011 (ref 1.005–1.030)
Urobilinogen, UA: 0.2 mg/dL (ref 0.0–1.0)
pH: 6 (ref 5.0–8.0)

## 2014-03-02 LAB — LIPASE, BLOOD: Lipase: 155 U/L — ABNORMAL HIGH (ref 11–59)

## 2014-03-02 MED ORDER — HYDROMORPHONE HCL PF 1 MG/ML IJ SOLN
1.0000 mg | Freq: Once | INTRAMUSCULAR | Status: AC
  Administered 2014-03-02: 1 mg via INTRAVENOUS
  Filled 2014-03-02: qty 1

## 2014-03-02 MED ORDER — HYDROCODONE-ACETAMINOPHEN 5-325 MG PO TABS: 1.0000 | ORAL_TABLET | Freq: Four times a day (QID) | ORAL | Status: DC | PRN

## 2014-03-02 MED ORDER — SODIUM CHLORIDE 0.9 % IV BOLUS (SEPSIS)
1000.0000 mL | Freq: Once | INTRAVENOUS | Status: AC
  Administered 2014-03-02: 1000 mL via INTRAVENOUS

## 2014-03-02 NOTE — Discharge Instructions (Signed)
Abdominal Pain, Women °Abdominal (stomach, pelvic, or belly) pain can be caused by many things. It is important to tell your doctor: °· The location of the pain. °· Does it come and go or is it present all the time? °· Are there things that start the pain (eating certain foods, exercise)? °· Are there other symptoms associated with the pain (fever, nausea, vomiting, diarrhea)? °All of this is helpful to know when trying to find the cause of the pain. °CAUSES  °· Stomach: virus or bacteria infection, or ulcer. °· Intestine: appendicitis (inflamed appendix), regional ileitis (Crohn's disease), ulcerative colitis (inflamed colon), irritable bowel syndrome, diverticulitis (inflamed diverticulum of the colon), or cancer of the stomach or intestine. °· Gallbladder disease or stones in the gallbladder. °· Kidney disease, kidney stones, or infection. °· Pancreas infection or cancer. °· Fibromyalgia (pain disorder). °· Diseases of the female organs: °· Uterus: fibroid (non-cancerous) tumors or infection. °· Fallopian tubes: infection or tubal pregnancy. °· Ovary: cysts or tumors. °· Pelvic adhesions (scar tissue). °· Endometriosis (uterus lining tissue growing in the pelvis and on the pelvic organs). °· Pelvic congestion syndrome (female organs filling up with blood just before the menstrual period). °· Pain with the menstrual period. °· Pain with ovulation (producing an egg). °· Pain with an IUD (intrauterine device, birth control) in the uterus. °· Cancer of the female organs. °· Functional pain (pain not caused by a disease, may improve without treatment). °· Psychological pain. °· Depression. °DIAGNOSIS  °Your doctor will decide the seriousness of your pain by doing an examination. °· Blood tests. °· X-rays. °· Ultrasound. °· CT scan (computed tomography, special type of X-ray). °· MRI (magnetic resonance imaging). °· Cultures, for infection. °· Barium enema (dye inserted in the large intestine, to better view it with  X-rays). °· Colonoscopy (looking in intestine with a lighted tube). °· Laparoscopy (minor surgery, looking in abdomen with a lighted tube). °· Major abdominal exploratory surgery (looking in abdomen with a large incision). °TREATMENT  °The treatment will depend on the cause of the pain.  °· Many cases can be observed and treated at home. °· Over-the-counter medicines recommended by your caregiver. °· Prescription medicine. °· Antibiotics, for infection. °· Birth control pills, for painful periods or for ovulation pain. °· Hormone treatment, for endometriosis. °· Nerve blocking injections. °· Physical therapy. °· Antidepressants. °· Counseling with a psychologist or psychiatrist. °· Minor or major surgery. °HOME CARE INSTRUCTIONS  °· Do not take laxatives, unless directed by your caregiver. °· Take over-the-counter pain medicine only if ordered by your caregiver. Do not take aspirin because it can cause an upset stomach or bleeding. °· Try a clear liquid diet (broth or water) as ordered by your caregiver. Slowly move to a bland diet, as tolerated, if the pain is related to the stomach or intestine. °· Have a thermometer and take your temperature several times a day, and record it. °· Bed rest and sleep, if it helps the pain. °· Avoid sexual intercourse, if it causes pain. °· Avoid stressful situations. °· Keep your follow-up appointments and tests, as your caregiver orders. °· If the pain does not go away with medicine or surgery, you may try: °· Acupuncture. °· Relaxation exercises (yoga, meditation). °· Group therapy. °· Counseling. °SEEK MEDICAL CARE IF:  °· You notice certain foods cause stomach pain. °· Your home care treatment is not helping your pain. °· You need stronger pain medicine. °· You want your IUD removed. °· You feel faint or   lightheaded.  You develop nausea and vomiting.  You develop a rash.  You are having side effects or an allergy to your medicine. SEEK IMMEDIATE MEDICAL CARE IF:   Your  pain does not go away or gets worse.  You have a fever.  Your pain is felt only in portions of the abdomen. The right side could possibly be appendicitis. The left lower portion of the abdomen could be colitis or diverticulitis.  You are passing blood in your stools (bright red or black tarry stools, with or without vomiting).  You have blood in your urine.  You develop chills, with or without a fever.  You pass out. MAKE SURE YOU:   Understand these instructions.  Will watch your condition.  Will get help right away if you are not doing well or get worse. Document Released: 07/23/2007 Document Revised: 12/18/2011 Document Reviewed: 08/12/2009 [facility] Patient Information 2014 [place], [place].  Acute Pancreatitis Acute pancreatitis is a disease in which the pancreas becomes suddenly inflamed. The pancreas is a large gland located behind your stomach. The pancreas produces enzymes that help digest food. The pancreas also releases the hormones glucagon and insulin that help regulate blood sugar. Damage to the pancreas occurs when the digestive enzymes from the pancreas are activated and begin attacking the pancreas before being released into the intestine. Most acute attacks last a couple of days and can cause serious complications. Some people become dehydrated and develop low blood pressure. In severe cases, bleeding into the pancreas can lead to shock and can be life-threatening. The lungs, heart, and kidneys may fail. CAUSES  Pancreatitis can happen to anyone. In some cases, the cause is unknown. Most cases are caused by:  Alcohol abuse.  Gallstones. Other less common causes are:  Certain medicines.  Exposure to certain chemicals.  Infection.  Damage caused by an accident (trauma).  Abdominal surgery. SYMPTOMS   Pain in the upper abdomen that may radiate to the back.  Tenderness and swelling of the abdomen.  Nausea and vomiting. DIAGNOSIS  Your caregiver will  perform a physical exam. Blood and stool tests may be done to confirm the diagnosis. Imaging tests may also be done, such as X-rays, CT scans, or an ultrasound of the abdomen. TREATMENT  Treatment usually requires a stay in the hospital. Treatment may include:  Pain medicine.  Fluid replacement through an intravenous line (IV).  Placing a tube in the stomach to remove stomach contents and control vomiting.  Not eating for 3 or 4 days. This gives your pancreas a rest, because enzymes are not being produced that can cause further damage.  Antibiotic medicines if your condition is caused by an infection.  Surgery of the pancreas or gallbladder. HOME CARE INSTRUCTIONS   Follow the diet advised by your caregiver. This may involve avoiding alcohol and decreasing the amount of fat in your diet.  Eat smaller, more frequent meals. This reduces the amount of digestive juices the pancreas produces.  Drink enough fluids to keep your urine clear or pale yellow.  Only take over-the-counter or prescription medicines as directed by your caregiver.  Avoid drinking alcohol if it caused your condition.  Do not smoke.  Get plenty of rest.  Check your blood sugar at home as directed by your caregiver.  Keep all follow-up appointments as directed by your caregiver. SEEK MEDICAL CARE IF:   You do not recover as quickly as expected.  You develop new or worsening symptoms.  You have persistent pain, weakness, or  nausea.  You recover and then have another episode of pain. SEEK IMMEDIATE MEDICAL CARE IF:   You are unable to eat or keep fluids down.  Your pain becomes severe.  You have a fever or persistent symptoms for more than 2 to 3 days.  You have a fever and your symptoms suddenly get worse.  Your skin or the white part of your eyes turn yellow (jaundice).  You develop vomiting.  You feel dizzy, or you faint.  Your blood sugar is high (over 300 mg/dL). MAKE SURE YOU:    Understand these instructions.  Will watch your condition.  Will get help right away if you are not doing well or get worse. Document Released: 09/25/2005 Document Revised: 03/26/2012 Document Reviewed: 01/04/2012 [facility] Patient Information 2014 [place].

## 2014-03-02 NOTE — ED Provider Notes (Signed)
CSN: [id number]     Arrival date & time 03/02/14  7829 History   First MD Initiated Contact with Patient 03/02/14 0326     Chief Complaint  Patient presents with  . Abdominal Pain     (Consider location/radiation/quality/duration/timing/severity/associated sxs/prior Treatment) HPI Comments: 48 year old female with diabetes and cholesterol, smoking history presents with recurrence right upper and epigastric abdominal pain for the past 3 days. Patient has not had this in the past. Patient initially thought was mild viral versus stooling issue however has gradually gotten worse and is now fairly constant. Worse with eating. No known ulcer history or bleeding. No known gallbladder problems. Patient had appendix out in the past. No fevers or chills. Nausea mild  Patient is a 48 y.o. female presenting with abdominal pain. The history is provided by the patient.  Abdominal Pain Associated symptoms: nausea   Associated symptoms: no chest pain, no chills, no dysuria, no fever, no shortness of breath and no vomiting     Past Medical History  Diagnosis Date  . Asthma   . Diabetes mellitus without complication    Past Surgical History  Procedure Laterality Date  . Lumbar fusion    . Carpal tunnel      right arm   History reviewed. No pertinent family history. History  Substance Use Topics  . Smoking status: Current Some Day Smoker  . Smokeless tobacco: Not on file  . Alcohol Use: No   OB History   Grav Para Term Preterm Abortions TAB SAB Ect Mult Living                 Review of Systems  Constitutional: Negative for fever and chills.  HENT: Negative for congestion.   Eyes: Negative for visual disturbance.  Respiratory: Negative for shortness of breath.   Cardiovascular: Negative for chest pain.  Gastrointestinal: Positive for nausea and abdominal pain. Negative for vomiting and blood in stool.  Genitourinary: Negative for dysuria and flank pain.  Musculoskeletal: Negative for  back pain, neck pain and neck stiffness.  Skin: Negative for rash.  Neurological: Negative for light-headedness and headaches.      Allergies  Review of patient's allergies indicates no known allergies.  Home Medications   Prior to Admission medications   Not on File   BP 130/74  Pulse 89  Temp(Src) 98.2 F (36.8 C) (Oral)  Resp 18  SpO2 97% Physical Exam  Nursing note and vitals reviewed. Constitutional: She is oriented to person, place, and time. She appears well-developed and well-nourished.  HENT:  Head: Normocephalic and atraumatic.  Mild diabetes membranes  Eyes: Conjunctivae are normal. Right eye exhibits no discharge. Left eye exhibits no discharge.  Neck: Normal range of motion. Neck supple. No tracheal deviation present.  Cardiovascular: Normal rate and regular rhythm.   Pulmonary/Chest: Effort normal and breath sounds normal.  Abdominal: Soft. She exhibits no distension. There is tenderness (right upper quadrant and epigastric obese). There is no guarding.  Musculoskeletal: She exhibits no edema.  Neurological: She is alert and oriented to person, place, and time.  Skin: Skin is warm. No rash noted.  Psychiatric: She has a normal mood and affect.    ED Course  Procedures (including critical care time)   Labs Review Labs Reviewed  URINALYSIS, ROUTINE W REFLEX MICROSCOPIC - Abnormal; Notable for the following:    Glucose, UA 100 (*)    All other components within normal limits  COMPREHENSIVE METABOLIC PANEL - Abnormal; Notable for the following:  Sodium 136 (*)    Glucose, Bld 218 (*)    Albumin 3.4 (*)    Total Bilirubin <0.2 (*)    GFR calc non Af Amer 76 (*)    GFR calc Af Amer 88 (*)    All other components within normal limits  CBC WITH DIFFERENTIAL - Abnormal; Notable for the following:    WBC 16.4 (*)    Neutro Abs 11.9 (*)    All other components within normal limits  LIPASE, BLOOD - Abnormal; Notable for the following:    Lipase 155 (*)     All other components within normal limits    Imaging Review No results found.   EKG Interpretation None      MDM   Final diagnoses:  Pancreatitis  Right upper quadrant abdominal pain   Clinical concern for gallbladder related versus ulcer versus gastritis. Plan for abdominal labs, pain meds, fluids and at that ultrasound.  on recheck patient's pain control. Mild persistent right upper quadrant pain. Patient has mild white blood cell count elevation and mild pancreatitis. Ultrasound of gallbladder pending. If concern for infection patient require surgery consult. If ultrasound unremarkable or cholelithiasis plan for medicine admit. IV fluids given in ED.  Plan signed out to ED provider to followup ultrasound result for final disposition which likely will be admission. Right upper abdominal pain, pancreatitis, leukocytosis      Mariea Clonts, MD 03/02/14 [id number]

## 2014-03-02 NOTE — ED Notes (Signed)
EMS called to home.  Found patient ambulatory.  Patient is complaining of abdominal pain that  Started on Saturday.  Patient denies any Hx of this issue before.

## 2014-03-02 NOTE — ED Provider Notes (Signed)
0800 - Care from Dr. Reather Converse. 62F here with RUQ, epigastric pain. Awaiting RUQ [place] results - concern for cholecystitis.  RUQ [place] normal, normal CBD. Patient feeling better, no vomiting, no need for further pain meds. Labs with mild elevation of lipase, normal LFTs. Patient likely with mild pancreatitis. Without persistent vomiting/pain, stable for discharge, given pain meds, instructed to use light diet, and f/u with her PCP. She has a PCP, Dr. Marlou Sa, she can f/u with later this week. Stable for discharge.  1. Pancreatitis   2. Right upper quadrant abdominal pain    US Abdomen Limited RUQ (Final result)  Result time: 03/02/14 08:13:45    Final result by Rad Results In Interface (03/02/14 08:13:45)    Narrative:   CLINICAL DATA: Right upper quadrant pain.  EXAM: US ABDOMEN LIMITED - RIGHT UPPER QUADRANT  COMPARISON: None.  FINDINGS: Gallbladder:  No gallstones or wall thickening visualized. No sonographic Murphy sign noted.  Common bile duct:  Diameter: 3.9 mm, normal.  Liver:  No focal lesion identified. Within normal limits in parenchymal echogenicity.  IMPRESSION: Normal exam.   Electronically Signed By: Rozetta Nunnery M.D. On: 03/02/2014 08:13          Osvaldo Shipper, MD 03/02/14 [phone]

## 2014-03-03 ENCOUNTER — Encounter (HOSPITAL_BASED_OUTPATIENT_CLINIC_OR_DEPARTMENT_OTHER): Payer: Self-pay | Admitting: Orthopedic Surgery

## 2015-06-08 ENCOUNTER — Encounter (HOSPITAL_COMMUNITY): Payer: Self-pay

## 2015-06-08 ENCOUNTER — Emergency Department (HOSPITAL_COMMUNITY)
Admission: EM | Admit: 2015-06-08 | Discharge: 2015-06-08 | Disposition: A | Discharge status: Home / Self Care | Payer: Medicaid Other | Attending: Emergency Medicine | Admitting: Emergency Medicine

## 2015-06-08 DIAGNOSIS — J45901 Unspecified asthma with (acute) exacerbation: Secondary | ICD-10-CM | POA: Insufficient documentation

## 2015-06-08 DIAGNOSIS — Z720 Tobacco use: Secondary | ICD-10-CM | POA: Diagnosis not present

## 2015-06-08 DIAGNOSIS — K219 Gastro-esophageal reflux disease without esophagitis: Secondary | ICD-10-CM | POA: Diagnosis not present

## 2015-06-08 DIAGNOSIS — R1013 Epigastric pain: Secondary | ICD-10-CM | POA: Insufficient documentation

## 2015-06-08 DIAGNOSIS — Z79899 Other long term (current) drug therapy: Secondary | ICD-10-CM | POA: Insufficient documentation

## 2015-06-08 DIAGNOSIS — Z8701 Personal history of pneumonia (recurrent): Secondary | ICD-10-CM | POA: Insufficient documentation

## 2015-06-08 DIAGNOSIS — E119 Type 2 diabetes mellitus without complications: Secondary | ICD-10-CM | POA: Diagnosis not present

## 2015-06-08 DIAGNOSIS — Z3202 Encounter for pregnancy test, result negative: Secondary | ICD-10-CM | POA: Insufficient documentation

## 2015-06-08 DIAGNOSIS — R109 Unspecified abdominal pain: Secondary | ICD-10-CM | POA: Diagnosis present

## 2015-06-08 DIAGNOSIS — E785 Hyperlipidemia, unspecified: Secondary | ICD-10-CM | POA: Diagnosis not present

## 2015-06-08 DIAGNOSIS — R011 Cardiac murmur, unspecified: Secondary | ICD-10-CM | POA: Insufficient documentation

## 2015-06-08 LAB — COMPREHENSIVE METABOLIC PANEL
ALBUMIN: 3.8 g/dL (ref 3.5–5.0)
ALK PHOS: 76 U/L (ref 38–126)
ALT: 43 U/L (ref 14–54)
AST: 36 U/L (ref 15–41)
Anion gap: 10 (ref 5–15)
BUN: 10 mg/dL (ref 6–20)
CALCIUM: 9.1 mg/dL (ref 8.9–10.3)
CHLORIDE: 103 mmol/L (ref 101–111)
CO2: 22 mmol/L (ref 22–32)
Creatinine, Ser: 0.74 mg/dL (ref 0.44–1.00)
GFR calc non Af Amer: >60 mL/min (ref >60)
GLUCOSE: 346 mg/dL — AB (ref 65–99)
POTASSIUM: 3.7 mmol/L (ref 3.5–5.1)
Sodium: 135 mmol/L (ref 135–145)
Total Bilirubin: 0.5 mg/dL (ref 0.3–1.2)
Total Protein: 7.1 g/dL (ref 6.5–8.1)

## 2015-06-08 LAB — URINE MICROSCOPIC-ADD ON

## 2015-06-08 LAB — CBC WITH DIFFERENTIAL/PLATELET
Basophils Absolute: 0 10*3/uL (ref 0.0–0.1)
Basophils Relative: 0 % (ref 0–1)
Eosinophils Absolute: 0.1 10*3/uL (ref 0.0–0.7)
Eosinophils Relative: 1 % (ref 0–5)
HCT: 46.1 % — ABNORMAL HIGH (ref 36.0–46.0)
Hemoglobin: 15.3 g/dL — ABNORMAL HIGH (ref 12.0–15.0)
Lymphocytes Relative: 31 % (ref 12–46)
Lymphs Abs: 3.8 10*3/uL (ref 0.7–4.0)
MCH: 30.9 pg (ref 26.0–34.0)
MCHC: 33.2 g/dL (ref 30.0–36.0)
MCV: 93.1 fL (ref 78.0–100.0)
Monocytes Absolute: 0.8 10*3/uL (ref 0.1–1.0)
Monocytes Relative: 7 % (ref 3–12)
Neutro Abs: 7.5 10*3/uL (ref 1.7–7.7)
Neutrophils Relative %: 61 % (ref 43–77)
Platelets: 211 10*3/uL (ref 150–400)
RBC: 4.95 MIL/uL (ref 3.87–5.11)
RDW: 12.9 % (ref 11.5–15.5)
WBC: 12.3 10*3/uL — ABNORMAL HIGH (ref 4.0–10.5)

## 2015-06-08 LAB — CBG MONITORING, ED: GLUCOSE-CAPILLARY: 231 mg/dL — AB (ref 65–99)

## 2015-06-08 LAB — URINALYSIS, ROUTINE W REFLEX MICROSCOPIC
Bilirubin Urine: NEGATIVE
Glucose, UA: >1000 mg/dL — AB
HGB URINE DIPSTICK: NEGATIVE
Ketones, ur: 15 mg/dL — AB
Leukocytes, UA: NEGATIVE
NITRITE: NEGATIVE
PH: 5.5 (ref 5.0–8.0)
Protein, ur: NEGATIVE mg/dL
SPECIFIC GRAVITY, URINE: 1.042 — AB (ref 1.005–1.030)
Urobilinogen, UA: 0.2 mg/dL (ref 0.0–1.0)

## 2015-06-08 LAB — LIPASE, BLOOD: LIPASE: 50 U/L (ref 22–51)

## 2015-06-08 LAB — POC URINE PREG, ED: Preg Test, Ur: NEGATIVE

## 2015-06-08 MED ORDER — FENTANYL CITRATE (PF) 100 MCG/2ML IJ SOLN
50.0000 ug | Freq: Once | INTRAMUSCULAR | Status: AC
  Administered 2015-06-08: 50 ug via INTRAVENOUS
  Filled 2015-06-08: qty 2

## 2015-06-08 MED ORDER — OXYCODONE-ACETAMINOPHEN 5-325 MG PO TABS
1.0000 | ORAL_TABLET | Freq: Once | ORAL | Status: AC
Start: 2015-06-08 — End: 2015-06-08
  Administered 2015-06-08: 1 via ORAL
  Filled 2015-06-08: qty 1

## 2015-06-08 MED ORDER — ONDANSETRON HCL 4 MG/2ML IJ SOLN
4.0000 mg | Freq: Once | INTRAMUSCULAR | Status: AC
  Administered 2015-06-08: 4 mg via INTRAVENOUS
  Filled 2015-06-08: qty 2

## 2015-06-08 MED ORDER — SODIUM CHLORIDE 0.9 % IV BOLUS (SEPSIS)
1000.0000 mL | Freq: Once | INTRAVENOUS | Status: AC
  Administered 2015-06-08: 1000 mL via INTRAVENOUS

## 2015-06-08 NOTE — ED Notes (Signed)
Pt. Presents with complaint of abd pain starting 2 days ago. Pt. States she has been nauseous and  Had 1 episode of diarrhea this AM, denies vomiting. Pt. With hx of pancreatitis, denies EOTH use. Pt. States pain is RUQ.

## 2015-06-08 NOTE — ED Provider Notes (Signed)
CSN: [id number]     Arrival date & time 06/08/15  0908 History   First MD Initiated Contact with Patient 06/08/15 [phone]     Chief Complaint  Patient presents with  . Abdominal Pain     (Consider location/radiation/quality/duration/timing/severity/associated sxs/prior Treatment) HPI [name] is a 49 y.o. female with a reported history of pancreatitis, diabetes, asthma, comes in for evaluation of abdominal pain. Patient states she has had epigastric pain "exactly the same as before when a hand pancreatitis" for the past 2 days. Reports associated nausea. She has not tried anything to improve her symptoms. Nothing seems to make it better or worse. She reports last alcohol consumption was "3 or 4 weeks ago". Rates discomfort as a 9/10. Denies any fevers, chills, vomiting, urinary symptoms, pelvic pain, vaginal bleeding or discharge, back pain, chest pain or shortness of breath.  Past Medical History  Diagnosis Date  . Hyperlipidemia   . Heart murmur     mild per patient  . GERD (gastroesophageal reflux disease)   . Wears partial dentures     top and bottom partials  . Pneumonia 12/14  . Asthma   . Diabetes mellitus without complication    Past Surgical History  Procedure Laterality Date  . Appendectomy  30- 40 years  . Anterior lat lumbar fusion Left 07/31/2013    Procedure: ANTERIOR LATERAL LUMBAR FUSION 1 LEVEL/Left sided lumbar 3-4 lateral interbody fusion with instrumentation and allograft.;  Surgeon: Sinclair Ship, MD;  Location: [place];  Service: Orthopedics;  Laterality: Left;  Left sided lumbar 3-4 lateral interbody fusion with instrumentation and allograft.  . Lateral epicondyle release Right 12/15/2013    Procedure: RIGHT ULNAR NEUROPLASTY AT ELBOW ;  Surgeon: Jolyn Nap, MD;  Location: [place];  Service: Orthopedics;  Laterality: Right;  . Carpal tunnel release Right 12/15/2013    Procedure: RIGHT CARPAL TUNNEL RELEASE ENDOSCOPIC;  Surgeon: Jolyn Nap, MD;  Location: [place];  Service: Orthopedics;  Laterality: Right;  . Lumbar fusion    . Carpal tunnel      right arm   No family history on file. Social History  Substance Use Topics  . Smoking status: Current Some Day Smoker  . Smokeless tobacco: None  . Alcohol Use: No     Comment: beer everynow and then per patient   OB History    No data available     Review of Systems A 10 point review of systems was completed and was negative except for pertinent positives and negatives as mentioned in the history of present illness     Allergies  Review of patient's allergies indicates no known allergies.  Home Medications   Prior to Admission medications   Medication Sig Start Date End Date Taking? Authorizing Provider  albuterol (PROVENTIL HFA;VENTOLIN HFA) 108 (90 BASE) MCG/ACT inhaler Inhale 2 puffs into the lungs every 6 (six) hours as needed for wheezing.   Yes Historical Provider, MD  amitriptyline (ELAVIL) 25 MG tablet Take 25 mg by mouth at bedtime.   Yes Historical Provider, MD  Cholecalciferol (VITAMIN D-3) 1000 UNITS CAPS Take 1,000 Units by mouth daily.   Yes Historical Provider, MD  DULoxetine (CYMBALTA) 30 MG capsule Take 30 mg by mouth daily.   Yes Historical Provider, MD  HYDROcodone-acetaminophen (NORCO/VICODIN) 5-325 MG per tablet Take 1 tablet by mouth every 8 (eight) hours as needed for severe pain.  04/07/15  Yes Historical Provider, MD  omeprazole (PRILOSEC) 40 MG  capsule Take 40 mg by mouth daily.   Yes Historical Provider, MD  rosuvastatin (CRESTOR) 10 MG tablet Take 10 mg by mouth daily.   Yes Historical Provider, MD  sitaGLIPtin-metformin (JANUMET) 50-500 MG per tablet Take 1 tablet by mouth 2 (two) times daily with a meal.   Yes Historical Provider, MD   BP 152/91 mmHg  Pulse 89  Temp(Src) 98.1 F (36.7 C) (Oral)  Resp 18  SpO2 98%  LMP 03/07/2012 Physical Exam  Constitutional: She is oriented to person, place, and time.  She appears well-developed and well-nourished.  HENT:  Head: Normocephalic and atraumatic.  Mouth/Throat: Oropharynx is clear and moist.  Eyes: Conjunctivae are normal. Pupils are equal, round, and reactive to light. Right eye exhibits no discharge. Left eye exhibits no discharge. No scleral icterus.  Neck: Neck supple.  Cardiovascular: Normal rate, regular rhythm and normal heart sounds.   Pulmonary/Chest: Effort normal and breath sounds normal. No respiratory distress. She has no wheezes. She has no rales.  Mild wheezing diffusely, suspect this is baseline. No other adventitious lung sounds. No respiratory distress.  Abdominal: Soft.  Diffuse epigastric tenderness. Abdomen is soft, nondistended. No rebound or guarding. No peritoneal signs.  Musculoskeletal: She exhibits no tenderness.  Neurological: She is alert and oriented to person, place, and time.  Cranial Nerves II-XII grossly intact  Skin: Skin is warm and dry. No rash noted.  Psychiatric: She has a normal mood and affect.  Nursing note and vitals reviewed.   ED Course  Procedures (including critical care time) Labs Review Labs Reviewed  COMPREHENSIVE METABOLIC PANEL - Abnormal; Notable for the following:    Glucose, Bld 346 (*)    All other components within normal limits  CBC WITH DIFFERENTIAL/PLATELET - Abnormal; Notable for the following:    WBC 12.3 (*)    Hemoglobin 15.3 (*)    HCT 46.1 (*)    All other components within normal limits  URINALYSIS, ROUTINE W REFLEX MICROSCOPIC (NOT AT [facility]) - Abnormal; Notable for the following:    Specific Gravity, Urine 1.042 (*)    Glucose, UA >1000 (*)    Ketones, ur 15 (*)    All other components within normal limits  URINE MICROSCOPIC-ADD ON - Abnormal; Notable for the following:    Squamous Epithelial / LPF FEW (*)    All other components within normal limits  CBG MONITORING, ED - Abnormal; Notable for the following:    Glucose-Capillary 231 (*)    All other components  within normal limits  LIPASE, BLOOD  POC URINE PREG, ED    Imaging Review No results found. I have personally reviewed and evaluated these images and lab results as part of my medical decision-making.   EKG Interpretation None     Meds given in ED:  Medications  sodium chloride 0.9 % bolus 1,000 mL (0 mLs Intravenous Stopped 06/08/15 1140)  ondansetron (ZOFRAN) injection 4 mg (4 mg Intravenous Given 06/08/15 0949)  fentaNYL (SUBLIMAZE) injection 50 mcg (50 mcg Intravenous Given 06/08/15 0949)  oxyCODONE-acetaminophen (PERCOCET/ROXICET) 5-325 MG per tablet 1 tablet (1 tablet Oral Given 06/08/15 1144)    Discharge Medication List as of 06/08/2015 11:38 AM     Filed Vitals:   06/08/15 1030 06/08/15 1032 06/08/15 1115 06/08/15 1138  BP: 127/64 127/64 118/62 152/91  Pulse: 85 82 76 89  Temp:      TempSrc:      Resp:  18  18  SpO2: 94% 95% 95% 98%    MDM  Vitals stable - WNL -afebrile Pt resting comfortably in ED. PE--diffuse epigastric pain.No peritoneal signs or evidence of acute or surgical abdomen Labwork--Original CMP shows glucose of 346 improved to 231 after 1 L IV fluids.Discussed follow-up with PCP for diabetes medications reconciliation.lipase 50.Labs are otherwise noncontributory.  DDX--patient's symptoms improved in the ED after IV fluid rehydration, antinausea medications and oral analgesia. No evidence of pancreatitis or other acute or emergent intra-abdominal pathology.patient states she will be able to follow up with her PCP this week for reevaluation.Given strict return precautions including fevers, chills, intractable nausea or vomiting, worsening abdominal pain. Patient very agreeable to plan. Patient stable, in good condition and is appropriate for discharge.  I discussed all relevant lab findings and imaging results with pt and they verbalized understanding. Discussed f/u with PCP within 48 hrs and return precautions, pt very amenable to plan.  Final  diagnoses:  Abdominal discomfort, epigastric        Comer Locket, PA-C 06/09/15 [place], MD 06/12/15 [phone]

## 2015-06-08 NOTE — Discharge Instructions (Signed)
There does not appear to be an emergent cause for your abdominal discomfort at this time. You were found to have elevated levels of sugar in your urine and in your blood. It is important to follow-up with your primary care doctor within 48 hours for reevaluation and reconciliation of your diabetes medications. Return to ED for new or worsening symptoms.  Abdominal Pain, Women Abdominal (stomach, pelvic, or belly) pain can be caused by many things. It is important to tell your doctor:  The location of the pain.  Does it come and go or is it present all the time?  Are there things that start the pain (eating certain foods, exercise)?  Are there other symptoms associated with the pain (fever, nausea, vomiting, diarrhea)? All of this is helpful to know when trying to find the cause of the pain. CAUSES   Stomach: virus or bacteria infection, or ulcer.  Intestine: appendicitis (inflamed appendix), regional ileitis (Crohn's disease), ulcerative colitis (inflamed colon), irritable bowel syndrome, diverticulitis (inflamed diverticulum of the colon), or cancer of the stomach or intestine.  Gallbladder disease or stones in the gallbladder.  Kidney disease, kidney stones, or infection.  Pancreas infection or cancer.  Fibromyalgia (pain disorder).  Diseases of the female organs:  Uterus: fibroid (non-cancerous) tumors or infection.  Fallopian tubes: infection or tubal pregnancy.  Ovary: cysts or tumors.  Pelvic adhesions (scar tissue).  Endometriosis (uterus lining tissue growing in the pelvis and on the pelvic organs).  Pelvic congestion syndrome (female organs filling up with blood just before the menstrual period).  Pain with the menstrual period.  Pain with ovulation (producing an egg).  Pain with an IUD (intrauterine device, birth control) in the uterus.  Cancer of the female organs.  Functional pain (pain not caused by a disease, may improve without  treatment).  Psychological pain.  Depression. DIAGNOSIS  Your doctor will decide the seriousness of your pain by doing an examination.  Blood tests.  X-rays.  Ultrasound.  CT scan (computed tomography, special type of X-ray).  MRI (magnetic resonance imaging).  Cultures, for infection.  Barium enema (dye inserted in the large intestine, to better view it with X-rays).  Colonoscopy (looking in intestine with a lighted tube).  Laparoscopy (minor surgery, looking in abdomen with a lighted tube).  Major abdominal exploratory surgery (looking in abdomen with a large incision). TREATMENT  The treatment will depend on the cause of the pain.   Many cases can be observed and treated at home.  Over-the-counter medicines recommended by your caregiver.  Prescription medicine.  Antibiotics, for infection.  Birth control pills, for painful periods or for ovulation pain.  Hormone treatment, for endometriosis.  Nerve blocking injections.  Physical therapy.  Antidepressants.  Counseling with a psychologist or psychiatrist.  Minor or major surgery. HOME CARE INSTRUCTIONS   Do not take laxatives, unless directed by your caregiver.  Take over-the-counter pain medicine only if ordered by your caregiver. Do not take aspirin because it can cause an upset stomach or bleeding.  Try a clear liquid diet (broth or water) as ordered by your caregiver. Slowly move to a bland diet, as tolerated, if the pain is related to the stomach or intestine.  Have a thermometer and take your temperature several times a day, and record it.  Bed rest and sleep, if it helps the pain.  Avoid sexual intercourse, if it causes pain.  Avoid stressful situations.  Keep your follow-up appointments and tests, as your caregiver orders.  If the pain does  not go away with medicine or surgery, you may try:  Acupuncture.  Relaxation exercises (yoga, meditation).  Group therapy.  Counseling. SEEK  MEDICAL CARE IF:   You notice certain foods cause stomach pain.  Your home care treatment is not helping your pain.  You need stronger pain medicine.  You want your IUD removed.  You feel faint or lightheaded.  You develop nausea and vomiting.  You develop a rash.  You are having side effects or an allergy to your medicine. SEEK IMMEDIATE MEDICAL CARE IF:   Your pain does not go away or gets worse.  You have a fever.  Your pain is felt only in portions of the abdomen. The right side could possibly be appendicitis. The left lower portion of the abdomen could be colitis or diverticulitis.  You are passing blood in your stools (bright red or black tarry stools, with or without vomiting).  You have blood in your urine.  You develop chills, with or without a fever.  You pass out. MAKE SURE YOU:   Understand these instructions.  Will watch your condition.  Will get help right away if you are not doing well or get worse. Document Released: 07/23/2007 Document Revised: 02/09/2014 Document Reviewed: 08/12/2009 [facility] Patient Information 2015 [place], [place]. This information is not intended to replace advice given to you by your health care provider. Make sure you discuss any questions you have with your health care provider.

## 2015-06-18 ENCOUNTER — Encounter (HOSPITAL_COMMUNITY): Payer: Self-pay | Admitting: *Deleted

## 2015-06-18 ENCOUNTER — Emergency Department (HOSPITAL_COMMUNITY)
Admission: EM | Admit: 2015-06-18 | Discharge: 2015-06-19 | Disposition: A | Discharge status: Home / Self Care | Payer: Medicaid Other | Attending: Emergency Medicine | Admitting: Emergency Medicine

## 2015-06-18 DIAGNOSIS — Z79899 Other long term (current) drug therapy: Secondary | ICD-10-CM | POA: Insufficient documentation

## 2015-06-18 DIAGNOSIS — K219 Gastro-esophageal reflux disease without esophagitis: Secondary | ICD-10-CM | POA: Diagnosis not present

## 2015-06-18 DIAGNOSIS — Z9049 Acquired absence of other specified parts of digestive tract: Secondary | ICD-10-CM | POA: Insufficient documentation

## 2015-06-18 DIAGNOSIS — E119 Type 2 diabetes mellitus without complications: Secondary | ICD-10-CM | POA: Diagnosis not present

## 2015-06-18 DIAGNOSIS — Z720 Tobacco use: Secondary | ICD-10-CM | POA: Diagnosis not present

## 2015-06-18 DIAGNOSIS — Z8701 Personal history of pneumonia (recurrent): Secondary | ICD-10-CM | POA: Insufficient documentation

## 2015-06-18 DIAGNOSIS — K861 Other chronic pancreatitis: Secondary | ICD-10-CM | POA: Diagnosis not present

## 2015-06-18 DIAGNOSIS — E785 Hyperlipidemia, unspecified: Secondary | ICD-10-CM | POA: Diagnosis not present

## 2015-06-18 DIAGNOSIS — J45909 Unspecified asthma, uncomplicated: Secondary | ICD-10-CM | POA: Diagnosis not present

## 2015-06-18 DIAGNOSIS — R011 Cardiac murmur, unspecified: Secondary | ICD-10-CM | POA: Diagnosis not present

## 2015-06-18 DIAGNOSIS — R1011 Right upper quadrant pain: Secondary | ICD-10-CM | POA: Diagnosis present

## 2015-06-18 NOTE — ED Notes (Signed)
Pain for two weeks , it has never stopped

## 2015-06-19 LAB — CBC WITH DIFFERENTIAL/PLATELET
Basophils Absolute: 0 10*3/uL (ref 0.0–0.1)
Basophils Relative: 0 % (ref 0–1)
Eosinophils Absolute: 0.2 10*3/uL (ref 0.0–0.7)
Eosinophils Relative: 1 % (ref 0–5)
HCT: 45.2 % (ref 36.0–46.0)
HEMOGLOBIN: 15 g/dL (ref 12.0–15.0)
LYMPHS ABS: 5.7 10*3/uL — AB (ref 0.7–4.0)
LYMPHS PCT: 42 % (ref 12–46)
MCH: 30.9 pg (ref 26.0–34.0)
MCHC: 33.2 g/dL (ref 30.0–36.0)
MCV: 93.2 fL (ref 78.0–100.0)
MONOS PCT: 8 % (ref 3–12)
Monocytes Absolute: 1.1 10*3/uL — ABNORMAL HIGH (ref 0.1–1.0)
NEUTROS PCT: 49 % (ref 43–77)
Neutro Abs: 6.7 10*3/uL (ref 1.7–7.7)
Platelets: 278 10*3/uL (ref 150–400)
RBC: 4.85 MIL/uL (ref 3.87–5.11)
RDW: 13.1 % (ref 11.5–15.5)
WBC: 13.7 10*3/uL — AB (ref 4.0–10.5)

## 2015-06-19 LAB — COMPREHENSIVE METABOLIC PANEL
ALK PHOS: 78 U/L (ref 38–126)
ALT: 37 U/L (ref 14–54)
AST: 34 U/L (ref 15–41)
Albumin: 4 g/dL (ref 3.5–5.0)
Anion gap: 9 (ref 5–15)
BILIRUBIN TOTAL: 0.5 mg/dL (ref 0.3–1.2)
BUN: 11 mg/dL (ref 6–20)
CALCIUM: 9.3 mg/dL (ref 8.9–10.3)
CO2: 21 mmol/L — ABNORMAL LOW (ref 22–32)
CREATININE: 0.82 mg/dL (ref 0.44–1.00)
Chloride: 107 mmol/L (ref 101–111)
Glucose, Bld: 219 mg/dL — ABNORMAL HIGH (ref 65–99)
Potassium: 3.9 mmol/L (ref 3.5–5.1)
Sodium: 137 mmol/L (ref 135–145)
TOTAL PROTEIN: 8.1 g/dL (ref 6.5–8.1)

## 2015-06-19 LAB — URINALYSIS, ROUTINE W REFLEX MICROSCOPIC
Bilirubin Urine: NEGATIVE
HGB URINE DIPSTICK: NEGATIVE
KETONES UR: NEGATIVE mg/dL
Leukocytes, UA: NEGATIVE
Nitrite: NEGATIVE
PROTEIN: NEGATIVE mg/dL
Specific Gravity, Urine: 1.024 (ref 1.005–1.030)
Urobilinogen, UA: 0.2 mg/dL (ref 0.0–1.0)
pH: 5.5 (ref 5.0–8.0)

## 2015-06-19 LAB — URINE MICROSCOPIC-ADD ON

## 2015-06-19 LAB — LIPASE, BLOOD: LIPASE: 120 U/L — AB (ref 22–51)

## 2015-06-19 MED ORDER — HYDROCODONE-ACETAMINOPHEN 5-325 MG PO TABS: 1.0000 | ORAL_TABLET | Freq: Three times a day (TID) | ORAL | Status: DC | PRN

## 2015-06-19 MED ORDER — ONDANSETRON 4 MG PREPACK (~~LOC~~): 1.0000 | ORAL_TABLET | Freq: Three times a day (TID) | ORAL | Status: DC | PRN

## 2015-06-19 MED ORDER — ONDANSETRON HCL 4 MG/2ML IJ SOLN
4.0000 mg | Freq: Once | INTRAMUSCULAR | Status: AC
  Administered 2015-06-19: 4 mg via INTRAVENOUS
  Filled 2015-06-19: qty 2

## 2015-06-19 MED ORDER — HYDROMORPHONE HCL 1 MG/ML IJ SOLN
1.0000 mg | Freq: Once | INTRAMUSCULAR | Status: AC
  Administered 2015-06-19: 1 mg via INTRAVENOUS
  Filled 2015-06-19: qty 1

## 2015-06-19 NOTE — ED Provider Notes (Addendum)
CSN: [id number]     Arrival date & time 06/18/15  2158 History   First MD Initiated Contact with Patient 06/19/15 0030     Chief Complaint  Patient presents with  . Abdominal Pain     (Consider location/radiation/quality/duration/timing/severity/associated sxs/prior Treatment) HPI Comments: 49 year old female with history of pancreatitis and frequent ED visits for pain returns with 2 weeks of chronic worsening right upper quadrant pain and dyspnea radiating to her right flank.  She feels like there is a knot over her ribs on the right side.  She is short of breath with exertion.  She cannot lay flat.  She has no appetite.  Anything she can heat is Cheerios, everything else makes her slightly nauseated.  Denies any alcohol.  States she's been having normal regular bowel movements.  No history of kidney stones  Patient is a 49 y.o. female presenting with abdominal pain. The history is provided by the patient.  Abdominal Pain Pain location:  Epigastric and RUQ Pain quality: gnawing   Pain radiates to:  R shoulder and R flank Pain severity:  Moderate Onset quality:  Gradual Timing:  Constant Progression:  Worsening Chronicity:  Recurrent Context: not diet changes, not eating, not medication withdrawal, not previous surgeries, not recent illness, not retching, not sick contacts, not suspicious food intake and not trauma   Relieved by:  Nothing Worsened by:  Nothing tried Ineffective treatments:  None tried Associated symptoms: nausea   Associated symptoms: no anorexia, no diarrhea and no shortness of breath   Risk factors: not pregnant and no recent hospitalization     Past Medical History  Diagnosis Date  . Hyperlipidemia   . Heart murmur     mild per patient  . GERD (gastroesophageal reflux disease)   . Wears partial dentures     top and bottom partials  . Pneumonia 12/14  . Asthma   . Diabetes mellitus without complication    Past Surgical History  Procedure Laterality Date   . Appendectomy  30- 40 years  . Anterior lat lumbar fusion Left 07/31/2013    Procedure: ANTERIOR LATERAL LUMBAR FUSION 1 LEVEL/Left sided lumbar 3-4 lateral interbody fusion with instrumentation and allograft.;  Surgeon: Sinclair Ship, MD;  Location: [place];  Service: Orthopedics;  Laterality: Left;  Left sided lumbar 3-4 lateral interbody fusion with instrumentation and allograft.  . Lateral epicondyle release Right 12/15/2013    Procedure: RIGHT ULNAR NEUROPLASTY AT ELBOW ;  Surgeon: Jolyn Nap, MD;  Location: [place];  Service: Orthopedics;  Laterality: Right;  . Carpal tunnel release Right 12/15/2013    Procedure: RIGHT CARPAL TUNNEL RELEASE ENDOSCOPIC;  Surgeon: Jolyn Nap, MD;  Location: [place];  Service: Orthopedics;  Laterality: Right;  . Lumbar fusion    . Carpal tunnel      right arm   History reviewed. No pertinent family history. Social History  Substance Use Topics  . Smoking status: Current Some Day Smoker  . Smokeless tobacco: None  . Alcohol Use: No     Comment: beer everynow and then per patient   OB History    No data available     Review of Systems  Respiratory: Negative for shortness of breath.   Gastrointestinal: Positive for nausea and abdominal pain. Negative for diarrhea and anorexia.      Allergies  Review of patient's allergies indicates no known allergies.  Home Medications   Prior to Admission medications   Medication Sig Start Date  End Date Taking? Authorizing Provider  albuterol (PROVENTIL HFA;VENTOLIN HFA) 108 (90 BASE) MCG/ACT inhaler Inhale 2 puffs into the lungs every 6 (six) hours as needed for wheezing.   Yes Historical Provider, MD  amitriptyline (ELAVIL) 25 MG tablet Take 25 mg by mouth daily with breakfast.    Yes Historical Provider, MD  Aspirin-Caffeine (BAYER BACK & BODY PAIN EX ST) 500-32.5 MG TABS Take 1-2 tablets by mouth every 2 (two) hours as needed (for pain).   Yes Historical  Provider, MD  Cholecalciferol (VITAMIN D-3) 1000 UNITS CAPS Take 1,000 Units by mouth daily.   Yes Historical Provider, MD  DULoxetine (CYMBALTA) 30 MG capsule Take 30 mg by mouth daily.   Yes Historical Provider, MD  omeprazole (PRILOSEC) 40 MG capsule Take 40 mg by mouth daily.   Yes Historical Provider, MD  rosuvastatin (CRESTOR) 10 MG tablet Take 10 mg by mouth daily.   Yes Historical Provider, MD  sitaGLIPtin-metformin (JANUMET) 50-500 MG per tablet Take 1 tablet by mouth 2 (two) times daily with a meal.   Yes Historical Provider, MD  HYDROcodone-acetaminophen (NORCO/VICODIN) 5-325 MG per tablet Take 1 tablet by mouth every 8 (eight) hours as needed for severe pain. 06/19/15   Junius Creamer, NP  ondansetron (ZOFRAN) 4 mg TABS tablet Take 4 tablets by mouth every 8 (eight) hours as needed. 06/19/15   Junius Creamer, NP   BP 139/59 mmHg  Pulse 78  Temp(Src) 97.8 F (36.6 C) (Oral)  Resp 19  Ht 5\' 3"  (1.6 m)  Wt 174 lb (78.926 kg)  BMI 30.83 kg/m2  SpO2 97%  LMP 03/07/2012 Physical Exam  Constitutional: She appears well-nourished.  Eyes: Pupils are equal, round, and reactive to light.  Neck: Normal range of motion.  Cardiovascular: Normal rate.   Pulmonary/Chest: Effort normal.  Abdominal: She exhibits no distension. There is tenderness.  Skin: Skin is warm.  Nursing note and vitals reviewed.   ED Course  Procedures (including critical care time) Labs Review Labs Reviewed  CBC WITH DIFFERENTIAL/PLATELET - Abnormal; Notable for the following:    WBC 13.7 (*)    Lymphs Abs 5.7 (*)    Monocytes Absolute 1.1 (*)    All other components within normal limits  COMPREHENSIVE METABOLIC PANEL - Abnormal; Notable for the following:    CO2 21 (*)    Glucose, Bld 219 (*)    All other components within normal limits  LIPASE, BLOOD - Abnormal; Notable for the following:    Lipase 120 (*)    All other components within normal limits  URINALYSIS, ROUTINE W REFLEX MICROSCOPIC (NOT AT [facility]) -  Abnormal; Notable for the following:    Glucose, UA >1000 (*)    All other components within normal limits  URINE MICROSCOPIC-ADD ON - Abnormal; Notable for the following:    Squamous Epithelial / LPF FEW (*)    All other components within normal limits    Imaging Review No results found. I have personally reviewed and evaluated these images and lab results as part of my medical decision-making.   EKG Interpretation None     Past left findings with patient.  She is agreeable to go home with pain and antiemetics.  She will follow-up as scheduled on September 14 with her PCP MDM   Final diagnoses:  Chronic pancreatitis, unspecified pancreatitis type         Junius Creamer, NP 06/19/15 [id number]  Merryl Hacker, MD 06/19/15 [place], NP 07/17/15 2027  Barbette Hair  [place], MD 07/19/15 2257

## 2015-06-19 NOTE — Discharge Instructions (Signed)
Today, your lipase is elevated, indicating that you have a flareup of your pancreatitis.  Please avoid alcohol at all cost.  You've been given pain medication as well as antiemetics.  Please uses as needed.  Make sure to keep your appointment on September 14 as scheduled

## 2015-10-27 ENCOUNTER — Ambulatory Visit (HOSPITAL_COMMUNITY)
Admission: RE | Admit: 2015-10-27 | Discharge: 2015-10-27 | Disposition: A | Discharge status: Home / Self Care | Payer: Medicaid Other | Source: Ambulatory Visit | Attending: Internal Medicine | Admitting: Internal Medicine

## 2015-10-27 ENCOUNTER — Other Ambulatory Visit (HOSPITAL_COMMUNITY): Payer: Self-pay | Admitting: Internal Medicine

## 2015-10-27 DIAGNOSIS — R05 Cough: Secondary | ICD-10-CM | POA: Diagnosis not present

## 2015-10-27 DIAGNOSIS — R0989 Other specified symptoms and signs involving the circulatory and respiratory systems: Secondary | ICD-10-CM | POA: Diagnosis not present

## 2015-10-27 DIAGNOSIS — R059 Cough, unspecified: Secondary | ICD-10-CM

## 2015-10-27 DIAGNOSIS — R509 Fever, unspecified: Secondary | ICD-10-CM | POA: Diagnosis not present

## 2015-11-04 ENCOUNTER — Emergency Department (HOSPITAL_COMMUNITY)
Admission: EM | Admit: 2015-11-04 | Discharge: 2015-11-04 | Disposition: A | Discharge status: Home / Self Care | Payer: Medicaid Other | Attending: Emergency Medicine | Admitting: Emergency Medicine

## 2015-11-04 ENCOUNTER — Emergency Department (HOSPITAL_COMMUNITY): Payer: Medicaid Other

## 2015-11-04 ENCOUNTER — Encounter (HOSPITAL_COMMUNITY): Payer: Self-pay | Admitting: Emergency Medicine

## 2015-11-04 DIAGNOSIS — Z79899 Other long term (current) drug therapy: Secondary | ICD-10-CM | POA: Diagnosis not present

## 2015-11-04 DIAGNOSIS — Z8701 Personal history of pneumonia (recurrent): Secondary | ICD-10-CM | POA: Insufficient documentation

## 2015-11-04 DIAGNOSIS — R1013 Epigastric pain: Secondary | ICD-10-CM | POA: Diagnosis not present

## 2015-11-04 DIAGNOSIS — E785 Hyperlipidemia, unspecified: Secondary | ICD-10-CM | POA: Insufficient documentation

## 2015-11-04 DIAGNOSIS — E119 Type 2 diabetes mellitus without complications: Secondary | ICD-10-CM | POA: Insufficient documentation

## 2015-11-04 DIAGNOSIS — Z794 Long term (current) use of insulin: Secondary | ICD-10-CM | POA: Insufficient documentation

## 2015-11-04 DIAGNOSIS — G8929 Other chronic pain: Secondary | ICD-10-CM | POA: Diagnosis not present

## 2015-11-04 DIAGNOSIS — Z9049 Acquired absence of other specified parts of digestive tract: Secondary | ICD-10-CM | POA: Insufficient documentation

## 2015-11-04 DIAGNOSIS — F17200 Nicotine dependence, unspecified, uncomplicated: Secondary | ICD-10-CM | POA: Insufficient documentation

## 2015-11-04 DIAGNOSIS — R1011 Right upper quadrant pain: Secondary | ICD-10-CM | POA: Diagnosis present

## 2015-11-04 DIAGNOSIS — J45909 Unspecified asthma, uncomplicated: Secondary | ICD-10-CM | POA: Insufficient documentation

## 2015-11-04 DIAGNOSIS — K219 Gastro-esophageal reflux disease without esophagitis: Secondary | ICD-10-CM | POA: Diagnosis not present

## 2015-11-04 DIAGNOSIS — Z7984 Long term (current) use of oral hypoglycemic drugs: Secondary | ICD-10-CM | POA: Insufficient documentation

## 2015-11-04 DIAGNOSIS — R011 Cardiac murmur, unspecified: Secondary | ICD-10-CM | POA: Diagnosis not present

## 2015-11-04 DIAGNOSIS — Z972 Presence of dental prosthetic device (complete) (partial): Secondary | ICD-10-CM | POA: Diagnosis not present

## 2015-11-04 LAB — CBC
HEMATOCRIT: 46.4 % — AB (ref 36.0–46.0)
HEMOGLOBIN: 15.4 g/dL — AB (ref 12.0–15.0)
MCH: 31 pg (ref 26.0–34.0)
MCHC: 33.2 g/dL (ref 30.0–36.0)
MCV: 93.5 fL (ref 78.0–100.0)
Platelets: 279 10*3/uL (ref 150–400)
RBC: 4.96 MIL/uL (ref 3.87–5.11)
RDW: 13.1 % (ref 11.5–15.5)
WBC: 16.8 10*3/uL — AB (ref 4.0–10.5)

## 2015-11-04 LAB — COMPREHENSIVE METABOLIC PANEL
ALBUMIN: 4.1 g/dL (ref 3.5–5.0)
ALK PHOS: 84 U/L (ref 38–126)
ALT: 39 U/L (ref 14–54)
ANION GAP: 12 (ref 5–15)
AST: 38 U/L (ref 15–41)
BILIRUBIN TOTAL: 0.5 mg/dL (ref 0.3–1.2)
BUN: 9 mg/dL (ref 6–20)
CALCIUM: 9.7 mg/dL (ref 8.9–10.3)
CO2: 24 mmol/L (ref 22–32)
Chloride: 105 mmol/L (ref 101–111)
Creatinine, Ser: 0.95 mg/dL (ref 0.44–1.00)
Glucose, Bld: 233 mg/dL — ABNORMAL HIGH (ref 65–99)
POTASSIUM: 3.9 mmol/L (ref 3.5–5.1)
Sodium: 141 mmol/L (ref 135–145)
TOTAL PROTEIN: 8.3 g/dL — AB (ref 6.5–8.1)

## 2015-11-04 LAB — LIPASE, BLOOD: Lipase: 131 U/L — ABNORMAL HIGH (ref 11–51)

## 2015-11-04 LAB — CBG MONITORING, ED
GLUCOSE-CAPILLARY: 190 mg/dL — AB (ref 65–99)
GLUCOSE-CAPILLARY: 158 mg/dL — AB (ref 65–99)

## 2015-11-04 MED ORDER — ONDANSETRON 8 MG PO TBDP: 8.0000 mg | ORAL_TABLET | Freq: Three times a day (TID) | ORAL | Status: DC | PRN

## 2015-11-04 MED ORDER — OXYCODONE-ACETAMINOPHEN 5-325 MG PO TABS
1.0000 | ORAL_TABLET | Freq: Once | ORAL | Status: AC
  Administered 2015-11-04: 1 via ORAL
  Filled 2015-11-04: qty 1

## 2015-11-04 MED ORDER — HYDROMORPHONE HCL 1 MG/ML IJ SOLN: 1.0000 mg | Freq: Once | INTRAMUSCULAR | Status: DC

## 2015-11-04 MED ORDER — FENTANYL CITRATE (PF) 100 MCG/2ML IJ SOLN
50.0000 ug | Freq: Once | INTRAMUSCULAR | Status: AC
  Administered 2015-11-04: 50 ug via INTRAVENOUS
  Filled 2015-11-04: qty 2

## 2015-11-04 NOTE — ED Provider Notes (Signed)
CSN: [id number]     Arrival date & time 11/04/15  1212 History   First MD Initiated Contact with Patient 11/04/15 1503     Chief Complaint  Patient presents with  . Abdominal Pain     HPI Patient presents emergency department complaining of upper abdominal discomfort and pain as well as mild right upper quadrant pain.  No prior history of gallstones.  She does have a history of pancreatitis reports this feels similar.  She reports nausea without vomiting.  She denies diarrhea.  No other complaints.  Pain is moderate to severe in severity at this time.  No chest pain shortness breath.  No fevers or chills.  No other complaints   Past Medical History  Diagnosis Date  . Hyperlipidemia   . Heart murmur     mild per patient  . GERD (gastroesophageal reflux disease)   . Wears partial dentures     top and bottom partials  . Pneumonia 12/14  . Asthma   . Diabetes mellitus without complication [facility])    Past Surgical History  Procedure Laterality Date  . Appendectomy  30- 40 years  . Anterior lat lumbar fusion Left 07/31/2013    Procedure: ANTERIOR LATERAL LUMBAR FUSION 1 LEVEL/Left sided lumbar 3-4 lateral interbody fusion with instrumentation and allograft.;  Surgeon: Sinclair Ship, MD;  Location: [place];  Service: Orthopedics;  Laterality: Left;  Left sided lumbar 3-4 lateral interbody fusion with instrumentation and allograft.  . Lateral epicondyle release Right 12/15/2013    Procedure: RIGHT ULNAR NEUROPLASTY AT ELBOW ;  Surgeon: Jolyn Nap, MD;  Location: [place];  Service: Orthopedics;  Laterality: Right;  . Carpal tunnel release Right 12/15/2013    Procedure: RIGHT CARPAL TUNNEL RELEASE ENDOSCOPIC;  Surgeon: Jolyn Nap, MD;  Location: [place];  Service: Orthopedics;  Laterality: Right;  . Lumbar fusion    . Carpal tunnel      right arm   No family history on file. Social History  Substance Use Topics  . Smoking status: Current  Some Day Smoker  . Smokeless tobacco: None  . Alcohol Use: No     Comment: beer everynow and then per patient   OB History    No data available     Review of Systems  All other systems reviewed and are negative.     Allergies  Review of patient's allergies indicates no known allergies.  Home Medications   Prior to Admission medications   Medication Sig Start Date End Date Taking? Authorizing Provider  albuterol (PROVENTIL HFA;VENTOLIN HFA) 108 (90 BASE) MCG/ACT inhaler Inhale 2 puffs into the lungs every 6 (six) hours as needed for wheezing.   Yes Historical Provider, MD  amitriptyline (ELAVIL) 25 MG tablet Take 25 mg by mouth daily with breakfast.    Yes Historical Provider, MD  Aspirin-Caffeine (BAYER BACK & BODY PAIN EX ST) 500-32.5 MG TABS Take 1-2 tablets by mouth every 2 (two) hours as needed (for pain).   Yes Historical Provider, MD  Cholecalciferol (VITAMIN D-3) 1000 UNITS CAPS Take 1,000 Units by mouth daily.   Yes Historical Provider, MD  DULoxetine (CYMBALTA) 30 MG capsule Take 30 mg by mouth daily.   Yes Historical Provider, MD  HYDROcodone-acetaminophen (NORCO/VICODIN) 5-325 MG per tablet Take 1 tablet by mouth every 8 (eight) hours as needed for severe pain. 06/19/15  Yes Junius Creamer, NP  insulin glargine (LANTUS) 100 UNIT/ML injection Inject 12 Units into the skin at bedtime.  Yes Historical Provider, MD  omeprazole (PRILOSEC) 40 MG capsule Take 40 mg by mouth daily.   Yes Historical Provider, MD  ondansetron (ZOFRAN) 4 mg TABS tablet Take 4 tablets by mouth every 8 (eight) hours as needed. Patient taking differently: Take 1 tablet by mouth every 8 (eight) hours as needed (nausea/vomitting).  06/19/15  Yes Junius Creamer, NP  rosuvastatin (CRESTOR) 10 MG tablet Take 10 mg by mouth daily.   Yes Historical Provider, MD  sitaGLIPtin-metformin (JANUMET) 50-500 MG per tablet Take 1 tablet by mouth daily.    Yes Historical Provider, MD  ondansetron (ZOFRAN ODT) 8 MG  disintegrating tablet Take 1 tablet (8 mg total) by mouth every 8 (eight) hours as needed for nausea or vomiting. 11/04/15   Jola Schmidt, MD   BP 137/96 mmHg  Pulse 65  Temp(Src) 98.6 F (37 C) (Oral)  Resp 18  SpO2 97%  LMP 03/07/2012 Physical Exam  Constitutional: She is oriented to person, place, and time. She appears well-developed and well-nourished. No distress.  HENT:  Head: Normocephalic and atraumatic.  Eyes: EOM are normal.  Neck: Normal range of motion.  Cardiovascular: Normal rate, regular rhythm and normal heart sounds.   Pulmonary/Chest: Effort normal and breath sounds normal.  Abdominal: Soft. She exhibits no distension.  Mild epigastric and right upper quadrant tenderness  Musculoskeletal: Normal range of motion.  Neurological: She is alert and oriented to person, place, and time.  Skin: Skin is warm and dry.  Psychiatric: She has a normal mood and affect. Judgment normal.  Nursing note and vitals reviewed.   ED Course  Procedures (including critical care time) Labs Review Labs Reviewed  LIPASE, BLOOD - Abnormal; Notable for the following:    Lipase 131 (*)    All other components within normal limits  COMPREHENSIVE METABOLIC PANEL - Abnormal; Notable for the following:    Glucose, Bld 233 (*)    Total Protein 8.3 (*)    All other components within normal limits  CBC - Abnormal; Notable for the following:    WBC 16.8 (*)    Hemoglobin 15.4 (*)    HCT 46.4 (*)    All other components within normal limits  CBG MONITORING, ED - Abnormal; Notable for the following:    Glucose-Capillary 190 (*)    All other components within normal limits  CBG MONITORING, ED - Abnormal; Notable for the following:    Glucose-Capillary 158 (*)    All other components within normal limits    Imaging Review US Abdomen Limited  11/04/2015  CLINICAL DATA:  Abdominal pain for 3 days.  Initial encounter. EXAM: US ABDOMEN LIMITED - RIGHT UPPER QUADRANT COMPARISON:  CT abdomen and  pelvis and abdominal ultrasound 06/08/2012. FINDINGS: Gallbladder: No gallstones or wall thickening visualized. No sonographic Murphy sign noted by sonographer. Common bile duct: Diameter: 0.4 cm Liver: Dense echotexture and somewhat increased echogenicity are seen. No focal lesion or intrahepatic biliary ductal dilatation. IMPRESSION: No acute abnormality.  Negative for gallstones. Fatty infiltration of the liver. Electronically Signed   By: Inge Rise M.D.   On: 11/04/2015 18:09   I have personally reviewed and evaluated these images and lab results as part of my medical decision-making.   EKG Interpretation   Date/Time:  Thursday November 04 2015 12:18:58 EST Ventricular Rate:  101 PR Interval:  147 QRS Duration: 75 QT Interval:  380 QTC Calculation: 493 R Axis:   56 Text Interpretation:  Sinus tachycardia Probable anteroseptal infarct, old  Baseline wander  in lead(s) V4 since last tracing no significant change  Confirmed by [facility]  MD, ELLIOTT [phone]) on 11/04/2015 3:59:22 PM      MDM   Final diagnoses:  Right upper quadrant pain  Chronic pain    Ultrasound labs without significant abnormalities except for mildly elevated lipase.  She's had this before in the past.  This could represent mild pancreatitis.  The patient be prescribed Zofran for home.  She explains to me that she receives monthly hydrocodone from Dr. Marlou Sa but did not receive them this month.  I told her I would be unable to prescribe her any opioid pain medication given her relationship with her primary care physician who writes monthly opioids.  The patient became frustrated and I believe left the emergency department.    Jola Schmidt, MD 11/04/15 [phone]

## 2015-11-04 NOTE — Progress Notes (Signed)
Entered in d/.c instructions Medicine, [facility] This is your assigned Medicaid [place] access doctor If you prefer another contact [place] assigned your doctor *You may receive a bill if you go to any family Dr not assigned to you [place] [address] [phone] Medicaid [place] Access Covered Patient [place]: [place], [place] [address] [email or web address] USE THIS [place] As a Medicaid client you MUST contact DSS/SSI each time you change address, move to another [place] or another state to keep your address updated [name] Medicaid Transportation to Dr appts if you are have full Medicaid: [phone], [phone]

## 2015-11-04 NOTE — ED Notes (Signed)
Bed: WA07 Expected date:  Expected time:  Means of arrival:  Comments: Hall A 

## 2015-11-04 NOTE — ED Notes (Signed)
Patient aware we need urine, unable to go at this time. Will let us know as soon as she can go.

## 2015-11-04 NOTE — ED Notes (Signed)
Per pt, states RUQ pain for a few days-no N/V/D

## 2015-12-06 ENCOUNTER — Other Ambulatory Visit: Payer: Self-pay | Admitting: Internal Medicine

## 2015-12-06 DIAGNOSIS — R10821 Right upper quadrant rebound abdominal tenderness: Secondary | ICD-10-CM

## 2015-12-10 ENCOUNTER — Inpatient Hospital Stay: Admission: RE | Admit: 2015-12-10 | Payer: Medicaid Other | Source: Ambulatory Visit

## 2015-12-15 ENCOUNTER — Ambulatory Visit
Admission: RE | Admit: 2015-12-15 | Discharge: 2015-12-15 | Disposition: A | Discharge status: Home / Self Care | Payer: Medicaid Other | Source: Ambulatory Visit | Attending: Internal Medicine | Admitting: Internal Medicine

## 2015-12-15 DIAGNOSIS — R10821 Right upper quadrant rebound abdominal tenderness: Secondary | ICD-10-CM

## 2015-12-15 MED ORDER — IOPAMIDOL (ISOVUE-300) INJECTION 61%
100.0000 mL | Freq: Once | INTRAVENOUS | Status: AC | PRN
  Administered 2015-12-15: 100 mL via INTRAVENOUS

## 2016-11-30 ENCOUNTER — Emergency Department (HOSPITAL_COMMUNITY)
Admission: EM | Admit: 2016-11-30 | Discharge: 2016-11-30 | Disposition: A | Discharge status: Home / Self Care | Payer: Medicare HMO | Attending: Emergency Medicine | Admitting: Emergency Medicine

## 2016-11-30 ENCOUNTER — Emergency Department (HOSPITAL_COMMUNITY): Payer: Medicare HMO

## 2016-11-30 ENCOUNTER — Encounter (HOSPITAL_COMMUNITY): Payer: Self-pay | Admitting: Emergency Medicine

## 2016-11-30 DIAGNOSIS — K858 Other acute pancreatitis without necrosis or infection: Secondary | ICD-10-CM | POA: Insufficient documentation

## 2016-11-30 DIAGNOSIS — R1011 Right upper quadrant pain: Secondary | ICD-10-CM | POA: Diagnosis present

## 2016-11-30 DIAGNOSIS — J45909 Unspecified asthma, uncomplicated: Secondary | ICD-10-CM | POA: Diagnosis not present

## 2016-11-30 DIAGNOSIS — Z87891 Personal history of nicotine dependence: Secondary | ICD-10-CM | POA: Insufficient documentation

## 2016-11-30 DIAGNOSIS — Z79899 Other long term (current) drug therapy: Secondary | ICD-10-CM | POA: Diagnosis not present

## 2016-11-30 DIAGNOSIS — E119 Type 2 diabetes mellitus without complications: Secondary | ICD-10-CM | POA: Diagnosis not present

## 2016-11-30 DIAGNOSIS — Z7982 Long term (current) use of aspirin: Secondary | ICD-10-CM | POA: Diagnosis not present

## 2016-11-30 DIAGNOSIS — Z794 Long term (current) use of insulin: Secondary | ICD-10-CM | POA: Diagnosis not present

## 2016-11-30 LAB — DIFFERENTIAL
BASOS ABS: 0 10*3/uL (ref 0.0–0.1)
Basophils Relative: 0 %
EOS ABS: 0.1 10*3/uL (ref 0.0–0.7)
Eosinophils Relative: 1 %
LYMPHS PCT: 33 %
Lymphs Abs: 4.1 10*3/uL — ABNORMAL HIGH (ref 0.7–4.0)
MONOS PCT: 6 %
Monocytes Absolute: 0.8 10*3/uL (ref 0.1–1.0)
Neutro Abs: 7.5 10*3/uL (ref 1.7–7.7)
Neutrophils Relative %: 60 %

## 2016-11-30 LAB — BASIC METABOLIC PANEL
ANION GAP: 13 (ref 5–15)
BUN: <5 mg/dL — ABNORMAL LOW (ref 6–20)
CALCIUM: 9.5 mg/dL (ref 8.9–10.3)
CO2: 26 mmol/L (ref 22–32)
Chloride: 100 mmol/L — ABNORMAL LOW (ref 101–111)
Creatinine, Ser: 0.72 mg/dL (ref 0.44–1.00)
Glucose, Bld: 233 mg/dL — ABNORMAL HIGH (ref 65–99)
Potassium: 3.2 mmol/L — ABNORMAL LOW (ref 3.5–5.1)
SODIUM: 139 mmol/L (ref 135–145)

## 2016-11-30 LAB — CBC
HCT: 45.9 % (ref 36.0–46.0)
HEMOGLOBIN: 15.2 g/dL — AB (ref 12.0–15.0)
MCH: 30.2 pg (ref 26.0–34.0)
MCHC: 33.1 g/dL (ref 30.0–36.0)
MCV: 91.3 fL (ref 78.0–100.0)
Platelets: 278 10*3/uL (ref 150–400)
RBC: 5.03 MIL/uL (ref 3.87–5.11)
RDW: 14.3 % (ref 11.5–15.5)
WBC: 12.5 10*3/uL — AB (ref 4.0–10.5)

## 2016-11-30 LAB — HEPATIC FUNCTION PANEL
ALBUMIN: 3.6 g/dL (ref 3.5–5.0)
ALT: 16 U/L (ref 14–54)
AST: 17 U/L (ref 15–41)
Alkaline Phosphatase: 65 U/L (ref 38–126)
Bilirubin, Direct: <0.1 mg/dL — ABNORMAL LOW (ref 0.1–0.5)
TOTAL PROTEIN: 7.3 g/dL (ref 6.5–8.1)
Total Bilirubin: 0.3 mg/dL (ref 0.3–1.2)

## 2016-11-30 LAB — I-STAT TROPONIN, ED: TROPONIN I, POC: 0 ng/mL (ref 0.00–0.08)

## 2016-11-30 LAB — LIPASE, BLOOD: LIPASE: 52 U/L — AB (ref 11–51)

## 2016-11-30 MED ORDER — IPRATROPIUM-ALBUTEROL 0.5-2.5 (3) MG/3ML IN SOLN
3.0000 mL | Freq: Once | RESPIRATORY_TRACT | Status: AC
  Administered 2016-11-30: 3 mL via RESPIRATORY_TRACT
  Filled 2016-11-30: qty 3

## 2016-11-30 MED ORDER — HYDROMORPHONE HCL 2 MG/ML IJ SOLN
1.0000 mg | Freq: Once | INTRAMUSCULAR | Status: AC
  Administered 2016-11-30: 1 mg via INTRAVENOUS
  Filled 2016-11-30: qty 1

## 2016-11-30 MED ORDER — INSULIN GLARGINE 100 UNIT/ML ~~LOC~~ SOLN
14.0000 [IU] | Freq: Once | SUBCUTANEOUS | Status: AC
  Administered 2016-11-30: 14 [IU] via SUBCUTANEOUS
  Filled 2016-11-30: qty 0.14

## 2016-11-30 MED ORDER — SODIUM CHLORIDE 0.9 % IV BOLUS (SEPSIS)
1000.0000 mL | Freq: Once | INTRAVENOUS | Status: AC
  Administered 2016-11-30: 1000 mL via INTRAVENOUS

## 2016-11-30 MED ORDER — ONDANSETRON 4 MG PO TBDP: 4.0000 mg | ORAL_TABLET | Freq: Three times a day (TID) | ORAL | 0 refills | Status: DC | PRN

## 2016-11-30 MED ORDER — ALBUTEROL SULFATE HFA 108 (90 BASE) MCG/ACT IN AERS: 1.0000 | INHALATION_SPRAY | Freq: Four times a day (QID) | RESPIRATORY_TRACT | 0 refills | Status: DC | PRN

## 2016-11-30 MED ORDER — ONDANSETRON HCL 4 MG/2ML IJ SOLN
4.0000 mg | Freq: Once | INTRAMUSCULAR | Status: AC
  Administered 2016-11-30: 4 mg via INTRAVENOUS
  Filled 2016-11-30: qty 2

## 2016-11-30 MED ORDER — HYDROCODONE-ACETAMINOPHEN 5-325 MG PO TABS
1.0000 | ORAL_TABLET | Freq: Four times a day (QID) | ORAL | 0 refills | Status: DC | PRN
Start: 2016-11-30 — End: 2018-05-14

## 2016-11-30 NOTE — ED Triage Notes (Signed)
Pt brought to ED by [facility] from home for c/o 10/10 mid cp and RLQ abd pain for almost 2 weeks getting worse today, pt denies any fever, chills, n/v/d at this time taking odc medication with no relieve, 324 mg ASA and one nitro sl given by EMS with no relieve. BP 144/70, HR 84, SPO2 97% 2L,CBG 250.

## 2016-11-30 NOTE — ED Provider Notes (Addendum)
By signing my name below, I, Avnee Patel, attest that this documentation has been prepared under the direction and in the presence of [place], DO  Electronically Signed: [place], ED Scribe. 11/30/16. 2:25 AM.  TIME SEEN: 1:57 AM  CHIEF COMPLAINT:  Chief Complaint  Patient presents with  . Abdominal Pain  . Chest Pain    HPI:   [name] is a 51 y.o. female, with a PMHx of DM, GERD and pancreatitis who presents to the Emergency Department complaining of intermittent episodes of right sided abdominal pain that travels to her back which she describes as a severe, cramping onset 2-3 weeks ago after a [employer]. Her pain is worse upon palpation. She notes her pain began to radiate up to her right lower chest today And then to her right back and additionally reports chills, a cough, wheezing, and SOB.  Per triage note, pt was given 324 mg ASA and 1 nitroglycerin with no relief. Pt denies a hx of abdominal surgeries, fever, nausea, vomiting, diarrhea, dysuria, vaginal discharge, melena, hematochezia, recent travel outside of the country, any recent sick contacts or any other associated symptoms. Pt also denies alcohol use and home oxygen use. States that this feels similar to her previous episodes of pancreatitis which they thought were due to "poor diet".  ROS: See HPI Constitutional: no fever  Eyes: no drainage  ENT: no runny nose   Cardiovascular:  +chest pain  Resp: +SOB  GI: no vomiting GU: no dysuria Integumentary: no rash  Allergy: no hives  Musculoskeletal: no leg swelling  Neurological: no slurred speech ROS otherwise negative  PAST MEDICAL HISTORY/PAST SURGICAL HISTORY:  Past Medical History:  Diagnosis Date  . Asthma   . Diabetes mellitus without complication ([place])   . GERD (gastroesophageal reflux disease)   . Heart murmur    mild per patient  . Hyperlipidemia   . Pneumonia 12/14  . Wears partial dentures    top and bottom partials     MEDICATIONS:  Prior to Admission medications   Medication Sig Start Date End Date Taking? Authorizing Provider  albuterol (PROVENTIL HFA;VENTOLIN HFA) 108 (90 BASE) MCG/ACT inhaler Inhale 2 puffs into the lungs every 6 (six) hours as needed for wheezing.    Historical Provider, MD  amitriptyline (ELAVIL) 25 MG tablet Take 25 mg by mouth daily with breakfast.     Historical Provider, MD  Aspirin-Caffeine (BAYER BACK & BODY PAIN EX ST) 500-32.5 MG TABS Take 1-2 tablets by mouth every 2 (two) hours as needed (for pain).    Historical Provider, MD  Cholecalciferol (VITAMIN D-3) 1000 UNITS CAPS Take 1,000 Units by mouth daily.    Historical Provider, MD  DULoxetine (CYMBALTA) 30 MG capsule Take 30 mg by mouth daily.    Historical Provider, MD  HYDROcodone-acetaminophen (NORCO/VICODIN) 5-325 MG per tablet Take 1 tablet by mouth every 8 (eight) hours as needed for severe pain. 06/19/15   Junius Creamer, NP  insulin glargine (LANTUS) 100 UNIT/ML injection Inject 12 Units into the skin at bedtime.    Historical Provider, MD  omeprazole (PRILOSEC) 40 MG capsule Take 40 mg by mouth daily.    Historical Provider, MD  ondansetron (ZOFRAN ODT) 8 MG disintegrating tablet Take 1 tablet (8 mg total) by mouth every 8 (eight) hours as needed for nausea or vomiting. 11/04/15   Jola Schmidt, MD  ondansetron [facility]) 4 mg TABS tablet Take 4 tablets by mouth every 8 (eight) hours as needed. Patient taking differently: Take  1 tablet by mouth every 8 (eight) hours as needed (nausea/vomitting).  06/19/15   Junius Creamer, NP  rosuvastatin (CRESTOR) 10 MG tablet Take 10 mg by mouth daily.    Historical Provider, MD  sitaGLIPtin-metformin (JANUMET) 50-500 MG per tablet Take 1 tablet by mouth daily.     Historical Provider, MD    ALLERGIES:  No Known Allergies  SOCIAL HISTORY:  Social History  Substance Use Topics  . Smoking status: Former Smoker    Quit date: 08/30/2016  . Smokeless tobacco: Never Used  . Alcohol use No      Comment: beer everynow and then per patient    FAMILY HISTORY: History reviewed. No pertinent family history.  EXAM: BP 144/96 (BP Location: Right Arm)   Pulse 92   Temp 98.5 F (36.9 C) (Oral)   Resp 20   Ht 5\' 3"  (1.6 m)   Wt 161 lb (73 kg)   LMP 03/07/2012   SpO2 90%   BMI 28.52 kg/m  CONSTITUTIONAL: Alert and oriented and responds appropriately to questions. Well-appearing; well-nourished HEAD: Normocephalic EYES: Conjunctivae clear, PERRL, EOMI ENT: normal nose; no rhinorrhea; moist mucous membranes NECK: Supple, no meningismus, no nuchal rigidity, no LAD  CARD: RRR; S1 and S2 appreciated; no murmurs, no clicks, no rubs, no gallops RESP: Normal chest excursion without splinting or tachypnea; breath sounds clear and equal bilaterally; no wheezes, no rhonchi, no rales, no hypoxia or respiratory distress, speaking full sentences ABD/GI: Normal bowel sounds; non-distended; soft, Tender throughout epigastric region and RUQ with positive voluntary guarding. No rebound, no peritoneal signs, no hepatosplenomegaly BACK:  The back appears normal and is non-tender to palpation, there is no CVA tenderness EXT: Normal ROM in all joints; non-tender to palpation; no edema; normal capillary refill; no cyanosis, no calf tenderness or swelling    SKIN: Normal color for age and race; warm; no rash NEURO: Moves all extremities equally, sensation to light touch intact diffusely, cranial nerves II through XII intact, normal speech PSYCH: The patient's mood and manner are appropriate. Grooming and personal hygiene are appropriate.  MEDICAL DECISION MAKING: Patient here with very atypical chest pain. She is also complaining of abdominal pain. Suspect pancreatitis. Imaging in the past that showed no acute complication from her pancreatitis and she has never had gallstones. Does have some voluntary guarding however today over the right upper quadrant and last imaging was in 2017. We'll obtain repeat  ultrasound today. We'll obtain labs and treat with IV fluids, Zofran, Dilaudid. She has not been vomiting. Troponin here negative. EKG shows no ischemic abnormality and chest x-ray is clear. Very low suspicion for ACS. Doubt pulmonary embolus. Doubt dissection.  ED PROGRESS: 4:20 AM Labs show mild elevation of her lipase of 52 but normal liver function tests. Wider prior ultrasound shows no gallstones, wall thickening and common bile duct is 4.6 mm. There is possible hepatic steatosis.  Patient reports feeling much better. States her abdominal pain completely gone after one dose of IV Dilaudid. I feel she is safe to be discharged home with pain medication and close outpatient follow-up. She is comfortable with this plan. Recommended bland diet for the next several days, increase water intake. I do not feel she needs further emergent workup.   She has had some intermittent low saturations in the 90s when her pulse ox was measured on her finger and reports history of asthma but is not currently wheezing. States any shortness of breath she has has improved after DuoNeb treatment. Lungs are  clear to auscultation.  I do not feel she has a significant asthma exacerbation and that she needs steroids at this time. We will discharge with albuterol inhaler. She states she no longer feels short of breath. She has received approximately 200 mL of IV fluids. Denies any history of CHF. Chest x-ray today was clear. We have changed her pulse oximeter to her ear and it reads 100%. I feel that this was likely an error.    At this time, I do not feel there is any life-threatening condition present. I have reviewed and discussed all results (EKG, imaging, lab, urine as appropriate) and exam findings with patient/family. I have reviewed nursing notes and appropriate previous records.  I feel the patient is safe to be discharged home without further emergent workup and can continue workup as an outpatient as needed. Discussed usual  and customary return precautions. Patient/family verbalize understanding and are comfortable with this plan.  Outpatient follow-up has been provided. All questions have been answered.     EKG Interpretation  Date/Time:  Thursday November 30 2016 01:29:48 EST Ventricular Rate:  79 PR Interval:    QRS Duration: 93 QT Interval:  417 QTC Calculation: 478 R Axis:   45 Text Interpretation:  Sinus rhythm Confirmed by Izan Miron,  DO, Balthazar Dooly [id number]) on 11/30/2016 1:42:11 AM      I personally performed the services described in this documentation, which was scribed in my presence. The recorded information has been reviewed and is accurate.     [place], DO 11/30/16 [place], DO 11/30/16 [place], DO 11/30/16 0500

## 2016-11-30 NOTE — ED Notes (Signed)
Very poor circulation noted to fingertips, even after nail polish removed 02 sat reading 88%. Pt denies SHOB. Probe moved to L ear, sat quickly up to 99-100%. MD notified

## 2016-11-30 NOTE — ED Notes (Signed)
Pt ambulatory to [place] with steady gait, NAD.

## 2016-11-30 NOTE — ED Notes (Signed)
Patient was ambulated off of oxygen, spO2 dropped to 86%.

## 2017-02-13 ENCOUNTER — Ambulatory Visit (HOSPITAL_COMMUNITY)
Admission: RE | Admit: 2017-02-13 | Discharge: 2017-02-13 | Disposition: A | Discharge status: Home / Self Care | Payer: Medicare HMO | Source: Ambulatory Visit | Attending: Internal Medicine | Admitting: Internal Medicine

## 2017-02-13 ENCOUNTER — Other Ambulatory Visit (HOSPITAL_COMMUNITY): Payer: Self-pay | Admitting: Internal Medicine

## 2017-02-13 DIAGNOSIS — J45909 Unspecified asthma, uncomplicated: Secondary | ICD-10-CM

## 2018-04-02 ENCOUNTER — Emergency Department (HOSPITAL_COMMUNITY)
Admission: EM | Admit: 2018-04-02 | Discharge: 2018-04-02 | Disposition: A | Discharge status: Home / Self Care | Payer: Medicare HMO | Attending: Emergency Medicine | Admitting: Emergency Medicine

## 2018-04-02 ENCOUNTER — Encounter (HOSPITAL_COMMUNITY): Payer: Self-pay | Admitting: Emergency Medicine

## 2018-04-02 ENCOUNTER — Emergency Department (HOSPITAL_COMMUNITY): Payer: Medicare HMO

## 2018-04-02 DIAGNOSIS — Z87891 Personal history of nicotine dependence: Secondary | ICD-10-CM | POA: Diagnosis not present

## 2018-04-02 DIAGNOSIS — E119 Type 2 diabetes mellitus without complications: Secondary | ICD-10-CM | POA: Insufficient documentation

## 2018-04-02 DIAGNOSIS — Z79899 Other long term (current) drug therapy: Secondary | ICD-10-CM | POA: Insufficient documentation

## 2018-04-02 DIAGNOSIS — J189 Pneumonia, unspecified organism: Secondary | ICD-10-CM | POA: Insufficient documentation

## 2018-04-02 DIAGNOSIS — R030 Elevated blood-pressure reading, without diagnosis of hypertension: Secondary | ICD-10-CM | POA: Diagnosis not present

## 2018-04-02 DIAGNOSIS — R51 Headache: Secondary | ICD-10-CM | POA: Insufficient documentation

## 2018-04-02 DIAGNOSIS — Z794 Long term (current) use of insulin: Secondary | ICD-10-CM | POA: Diagnosis not present

## 2018-04-02 DIAGNOSIS — R519 Headache, unspecified: Secondary | ICD-10-CM

## 2018-04-02 DIAGNOSIS — E785 Hyperlipidemia, unspecified: Secondary | ICD-10-CM | POA: Insufficient documentation

## 2018-04-02 DIAGNOSIS — J181 Lobar pneumonia, unspecified organism: Secondary | ICD-10-CM

## 2018-04-02 DIAGNOSIS — J45909 Unspecified asthma, uncomplicated: Secondary | ICD-10-CM | POA: Insufficient documentation

## 2018-04-02 LAB — CBG MONITORING, ED: Glucose-Capillary: 127 mg/dL — ABNORMAL HIGH (ref 70–99)

## 2018-04-02 MED ORDER — LEVOFLOXACIN 250 MG PO TABS: 250.0000 mg | ORAL_TABLET | Freq: Every day | ORAL | 0 refills | Status: DC

## 2018-04-02 MED ORDER — PREDNISONE 20 MG PO TABS: 60.0000 mg | ORAL_TABLET | Freq: Once | ORAL | Status: DC

## 2018-04-02 MED ORDER — DIPHENHYDRAMINE HCL 50 MG/ML IJ SOLN
25.0000 mg | Freq: Once | INTRAMUSCULAR | Status: AC
  Administered 2018-04-02: 25 mg via INTRAVENOUS
  Filled 2018-04-02: qty 1

## 2018-04-02 MED ORDER — ACETAMINOPHEN 500 MG PO TABS
1000.0000 mg | ORAL_TABLET | Freq: Once | ORAL | Status: AC
  Administered 2018-04-02: 1000 mg via ORAL
  Filled 2018-04-02: qty 2

## 2018-04-02 MED ORDER — KETOROLAC TROMETHAMINE 15 MG/ML IJ SOLN
15.0000 mg | Freq: Once | INTRAMUSCULAR | Status: AC
  Administered 2018-04-02: 15 mg via INTRAVENOUS
  Filled 2018-04-02: qty 1

## 2018-04-02 MED ORDER — ALBUTEROL SULFATE HFA 108 (90 BASE) MCG/ACT IN AERS
2.0000 | INHALATION_SPRAY | Freq: Once | RESPIRATORY_TRACT | Status: AC
  Administered 2018-04-02: 2 via RESPIRATORY_TRACT
  Filled 2018-04-02: qty 6.7

## 2018-04-02 MED ORDER — SODIUM CHLORIDE 0.9 % IV BOLUS
1000.0000 mL | Freq: Once | INTRAVENOUS | Status: AC
  Administered 2018-04-02: 1000 mL via INTRAVENOUS

## 2018-04-02 MED ORDER — LEVOFLOXACIN 750 MG PO TABS
750.0000 mg | ORAL_TABLET | Freq: Once | ORAL | Status: AC
  Administered 2018-04-02: 750 mg via ORAL
  Filled 2018-04-02: qty 1

## 2018-04-02 MED ORDER — ALBUTEROL SULFATE (2.5 MG/3ML) 0.083% IN NEBU
5.0000 mg | INHALATION_SOLUTION | Freq: Once | RESPIRATORY_TRACT | Status: AC
  Administered 2018-04-02: 5 mg via RESPIRATORY_TRACT
  Filled 2018-04-02: qty 6

## 2018-04-02 MED ORDER — IPRATROPIUM BROMIDE 0.02 % IN SOLN
0.5000 mg | Freq: Once | RESPIRATORY_TRACT | Status: AC
  Administered 2018-04-02: 0.5 mg via RESPIRATORY_TRACT
  Filled 2018-04-02: qty 2.5

## 2018-04-02 MED ORDER — PREDNISONE 20 MG PO TABS
30.0000 mg | ORAL_TABLET | Freq: Once | ORAL | Status: AC
  Administered 2018-04-02: 30 mg via ORAL
  Filled 2018-04-02: qty 2

## 2018-04-02 MED ORDER — ALBUTEROL SULFATE HFA 108 (90 BASE) MCG/ACT IN AERS: 1.0000 | INHALATION_SPRAY | Freq: Four times a day (QID) | RESPIRATORY_TRACT | 0 refills | Status: DC | PRN

## 2018-04-02 MED ORDER — PREDNISONE 20 MG PO TABS: 20.0000 mg | ORAL_TABLET | Freq: Every day | ORAL | 0 refills | Status: DC

## 2018-04-02 MED ORDER — LEVOFLOXACIN 750 MG PO TABS: 750.0000 mg | ORAL_TABLET | Freq: Every day | ORAL | 0 refills | Status: AC

## 2018-04-02 MED ORDER — PREDNISONE 20 MG PO TABS: 20.0000 mg | ORAL_TABLET | Freq: Every day | ORAL | 0 refills | Status: AC

## 2018-04-02 MED ORDER — METOCLOPRAMIDE HCL 5 MG/ML IJ SOLN
10.0000 mg | Freq: Once | INTRAMUSCULAR | Status: AC
  Administered 2018-04-02: 10 mg via INTRAVENOUS
  Filled 2018-04-02: qty 2

## 2018-04-02 NOTE — ED Triage Notes (Signed)
Headache for a week worsening. Denies nausea vomiting. Denies fevers. Neck pain with movement. States starts on left side and radiates down to back of head and neck. No hx of migraines. Denies blood thinners. No other neuro symptoms- ambulates well.

## 2018-04-02 NOTE — ED Notes (Signed)
PIV 22g removed from Left AC.  Intact, bleeding controlled, dressing applied

## 2018-04-02 NOTE — Discharge Instructions (Signed)
Please see the information and instructions below regarding your visit.  Your diagnoses today include:  1. Bad headache   2. Community acquired pneumonia of right middle lobe of lung ([place])   3. Elevated blood pressure reading without diagnosis of hypertension     You were seen and treated in the emergency department today for headache and lung infection. Fortunately, your vitals, exam, and work-up is reassuring with no apparent emergent cause for your headache at this time.  Tests performed today include: See side panel of your discharge paperwork for testing performed today. Vital signs are listed at the bottom of these instructions.   Medications prescribed:    Try to avoid daily or regular use of tylenol, aspirin, ibuprofen, and other overt-the-counter pain medications as this can contribute to rebound headaches.   Take any prescribed medications only as prescribed, and any over the counter medications only as directed on the packaging.  Please take all of your antibiotics until finished.   You may develop abdominal discomfort or nausea from the antibiotic. If this occurs, you may take it with food. Some patients also get diarrhea with antibiotics. You may help offset this with probiotics which you can buy or get in yogurt. Do not eat or take the probiotics until 2 hours after your antibiotic. Some women develop vaginal yeast infections after antibiotics. If you develop unusual vaginal discharge after being on this medication, please see your primary care provider.   Some people develop allergies to antibiotics. Symptoms of antibiotic allergy can be mild and include a flat rash and itching. They can also be more serious and include:  ?Hives - Hives are raised, red patches of skin that are usually very itchy.  ?Lip or tongue swelling  ?Trouble swallowing or breathing  ?Blistering of the skin or mouth.  If you have any of these serious symptoms, please seek emergency medical care  immediately.  You are prescribed prednisone, a steroid. This is a medication to help reduce inflammation in the lungs.  Common side effects include upset stomach/nausea. You may take this medicine with food if this occurs. Other side effects include restlessness, difficulty sleeping, and increased sweating. Call your healthcare provider if these do not resolve after finishing the medication.  This medicine may increase your blood sugar so additional careful monitoring is needed of blood sugar if you have diabetes. Call your healthcare provider for any signs/symtpoms of high blood sugar such as confusion, feeling sleepy, more thirst, more hunger, passing urine more often, flushing, fast breathing, or breath that smells like fruit.   Home care instructions:   Drink plenty of fluids at home. This will help with your headache. Be cautious with caffeine use, as this can cause your headache to rebound when the effects wear off. If you drink more than 2 cups of coffee/caffeinated tea, or caffeinated soda per day, I suggest you wean down that amount.  Please follow any educational materials contained in this packet.   Follow-up instructions: Please follow-up with your primary care provider in 1 week and then 3 weeks for further evaluation of your symptoms if they are not completely improved.    Return instructions:  Please return to the Emergency Department if you experience worsening symptoms. It is VERY important that you monitor your symptoms at home. If you develop worsening headache, new fever, new neck stiffness, rash, focal weakness or numbness, or any other new or concerning symptoms, please return to the ED immediately, as these may be signs that your  headache has become a potentially serious and life-threatening condition.  Please return for any shortness of breath, chest pain, or fevers. Please return if you have any other emergent concerns.  Additional Information:   Your vital signs  today were: BP (!) 163/72    Pulse 81    Temp 98.3 F (36.8 C) (Oral)    LMP 03/07/2012    SpO2 99%  If your blood pressure (BP) was elevated on multiple readings during this visit above 130 for the top number or above 80 for the bottom number, please have this repeated by your primary care provider within one month. --------------  Thank you for allowing [place] to participate in your care today.

## 2018-04-02 NOTE — ED Notes (Signed)
Hooked patient up to the monitor patient is resting with call bell in reach 

## 2018-04-02 NOTE — ED Provider Notes (Signed)
[facility] EMERGENCY DEPARTMENT Provider Note   CSN: [id number] Arrival date & time: 04/02/18  6384     History   Chief Complaint Chief Complaint  Patient presents with  . Headache    HPI [name] is a 52 y.o. female.  HPI  Patient is a 52 year old female with a history of diabetes mellitus, asthma, hyperlipidemia presenting for left-sided headache.  Patient reports that she began having a headache approximately 8 to 9 days ago, and it began gradually 1 evening.  Patient reports it is worse in the morning, and will lessen throughout the day, but she still goes to bed each night with a headache each day for the last 8 to 9 days.  Patient denies any fevers, recent respiratory infection, neck stiffness, visual disturbance, speech disturbance, focal weakness or numbness.  Patient also noting that she has had congestion with purulent rhinorrhea over the past couple weeks.  Patient reports asthma at baseline, and has had increased productive cough with wheezing.  Patient denies any family history of aneurysm.  Patient reports that she has had mild headaches in the past, but this is worse than a typical headache for her.  Headache not relieved by Tylenol.   Past Medical History:  Diagnosis Date  . Asthma   . Diabetes mellitus without complication ([place])   . GERD (gastroesophageal reflux disease)   . Heart murmur    mild per patient  . Hyperlipidemia   . Pneumonia 12/14  . Wears partial dentures    top and bottom partials    Patient Active Problem List   Diagnosis Date Noted  . HCAP (healthcare-associated pneumonia) 10/05/2013  . Flu-like symptoms 10/05/2013  . Asthma exacerbation 10/05/2013  . Diabetes mellitus ([place]) 10/05/2013  . GERD (gastroesophageal reflux disease) 10/05/2013  . Tobacco use 10/05/2013    Past Surgical History:  Procedure Laterality Date  . ANTERIOR LAT LUMBAR FUSION Left 07/31/2013   Procedure: ANTERIOR LATERAL LUMBAR FUSION 1  LEVEL/Left sided lumbar 3-4 lateral interbody fusion with instrumentation and allograft.;  Surgeon: Sinclair Ship, MD;  Location: [facility];  Service: Orthopedics;  Laterality: Left;  Left sided lumbar 3-4 lateral interbody fusion with instrumentation and allograft.  . APPENDECTOMY  30- 40 years  . carpal tunnel     right arm  . CARPAL TUNNEL RELEASE Right 12/15/2013   Procedure: RIGHT CARPAL TUNNEL RELEASE ENDOSCOPIC;  Surgeon: Jolyn Nap, MD;  Location: [facility];  Service: Orthopedics;  Laterality: Right;  . LATERAL EPICONDYLE RELEASE Right 12/15/2013   Procedure: RIGHT ULNAR NEUROPLASTY AT ELBOW ;  Surgeon: Jolyn Nap, MD;  Location: [facility];  Service: Orthopedics;  Laterality: Right;  . LUMBAR FUSION       OB History   None      Home Medications    Prior to Admission medications   Medication Sig Start Date End Date Taking? Authorizing Provider  albuterol (PROVENTIL HFA;VENTOLIN HFA) 108 (90 BASE) MCG/ACT inhaler Inhale 2 puffs into the lungs every 6 (six) hours as needed for wheezing.    [provider]  albuterol (PROVENTIL HFA;VENTOLIN HFA) 108 (90 Base) MCG/ACT inhaler Inhale 1-2 puffs into the lungs every 6 (six) hours as needed for wheezing or shortness of breath. 11/30/16   Ward, Delice Bison, DO  amitriptyline (ELAVIL) 25 MG tablet Take 25 mg by mouth daily with breakfast.     [provider]  Aspirin-Caffeine (BAYER BACK & BODY PAIN EX ST) 500-32.5 MG TABS  Take 1-2 tablets by mouth every 2 (two) hours as needed (for pain).    [provider]  Cholecalciferol (VITAMIN D-3) 1000 UNITS CAPS Take 1,000 Units by mouth daily.    [provider]  DULoxetine (CYMBALTA) 30 MG capsule Take 30 mg by mouth daily.    [provider]  HYDROcodone-acetaminophen (NORCO/VICODIN) 5-325 MG tablet Take 1-2 tablets by mouth every 6 (six) hours as needed. 11/30/16   Ward, Delice Bison, DO  insulin glargine (LANTUS)  100 UNIT/ML injection Inject 12 Units into the skin at bedtime.    [provider]  omeprazole (PRILOSEC) 40 MG capsule Take 40 mg by mouth daily.    [provider]  ondansetron (ZOFRAN ODT) 4 MG disintegrating tablet Take 1 tablet (4 mg total) by mouth every 8 (eight) hours as needed for nausea or vomiting. 11/30/16   Ward, Delice Bison, DO  rosuvastatin (CRESTOR) 10 MG tablet Take 10 mg by mouth daily.    [provider]  sitaGLIPtin-metformin (JANUMET) 50-500 MG per tablet Take 1 tablet by mouth daily.     [provider]    Family History History reviewed. No pertinent family history.  Social History Social History   Tobacco Use  . Smoking status: Former Smoker    Last attempt to quit: 08/30/2016    Years since quitting: 1.5  . Smokeless tobacco: Never Used  Substance Use Topics  . Alcohol use: No    Comment: beer everynow and then per patient  . Drug use: No     Allergies   Patient has no known allergies.   Review of Systems Review of Systems  Constitutional: Negative for chills and fever.  HENT: Positive for congestion, rhinorrhea and sinus pressure. Negative for trouble swallowing.   Eyes: Negative for visual disturbance.  Respiratory: Positive for cough and wheezing. Negative for shortness of breath.   Cardiovascular: Negative for chest pain.  Gastrointestinal: Negative for abdominal pain, nausea and vomiting.  Musculoskeletal: Negative for gait problem.  Neurological: Positive for headaches. Negative for syncope.  All other systems reviewed and are negative.    Physical Exam Updated Vital Signs LMP 03/07/2012   Physical Exam  Constitutional: She appears well-developed and well-nourished.  Patient tearful on examination.  HENT:  Head: Normocephalic and atraumatic.  Mouth/Throat: Oropharynx is clear and moist.  Eyes: Pupils are equal, round, and reactive to light. Conjunctivae and EOM are normal.  Neck: Normal range of  motion. Neck supple.  No nuchal rigidity.  No meningismus.  Cardiovascular: Normal rate, regular rhythm, S1 normal and S2 normal.  No murmur heard. Pulmonary/Chest: Effort normal. She has wheezes. She has rales.  Abdominal: Soft. She exhibits no distension. There is no tenderness. There is no guarding.  Musculoskeletal: Normal range of motion. She exhibits no edema or deformity.  Lymphadenopathy:    She has no cervical adenopathy.  Neurological: She is alert. GCS eye subscore is 4. GCS verbal subscore is 5. GCS motor subscore is 6.  Mental Status:  Alert, oriented, thought content appropriate, able to give a coherent history. Speech fluent without evidence of aphasia. Able to follow 2 step commands without difficulty.  Cranial Nerves:  II:  Peripheral visual fields grossly normal, pupils equal, round, reactive to light III,IV, VI: ptosis not present, extra-ocular motions intact bilaterally  V,VII: smile symmetric, facial light touch sensation equal VIII: hearing grossly normal to voice  X: uvula elevates symmetrically  XI: bilateral shoulder shrug symmetric and strong XII: midline tongue extension without fassiculations  Motor:  Normal tone. 5/5 in upper and lower extremities bilaterally including strong and equal grip strength and dorsiflexion/plantar flexion Sensory: Pinprick and light touch normal in all extremities.  Deep Tendon Reflexes: 2+ and symmetric in the biceps and patella. No clonus. Cerebellar: normal finger-to-nose with bilateral upper extremities Gait: normal gait and balance Stance: Romberg negative. No pronator drift and good coordination, strength, and position sense with tapping of bilateral arms (performed in sitting position). CV: distal pulses palpable throughout   Skin: Skin is warm and dry. No rash noted. No erythema.  Patient exhibits diffuse thinning of fingernails of all fingers bilaterally.  Psychiatric: She has a normal mood and affect. Her behavior is  normal. Judgment and thought content normal.  Nursing note and vitals reviewed.    ED Treatments / Results  Labs (all labs ordered are listed, but only abnormal results are displayed) Labs Reviewed  CBG MONITORING, ED - Abnormal; Notable for the following components:      Result Value   Glucose-Capillary 127 (*)    All other components within normal limits    EKG None  Radiology Dg Chest 2 View  Result Date: 04/02/2018 CLINICAL DATA:  Wheezing, history of asthma, previous episodes of pneumonia., former smoker. EXAM: CHEST - 2 VIEW COMPARISON:  Chest x-ray of Feb 13, 2017 FINDINGS: The lungs are well-expanded. The interstitial markings of both lungs are coarse though stable. There is hazy increased density in the right lower lung likely in the middle lobe. The left lung exhibits no discrete infiltrate. The heart and pulmonary vascularity are normal. There is no pleural effusion. There is gentle curvature of the lower thoracic and upper lumbar spine with the convexity toward the right. IMPRESSION: Findings worrisome for early pneumonia in the right middle lobe. Underlying reactive airway disease. Followup PA and lateral chest X-ray is recommended in 3-4 weeks following trial of antibiotic therapy to ensure resolution and exclude underlying malignancy. Electronically Signed   By: David  Martinique M.D.   On: 04/02/2018 10:59    Procedures Procedures (including critical care time)  Medications Ordered in ED Medications - No data to display   Initial Impression / Assessment and Plan / ED Course  I have reviewed the triage vital signs and the nursing notes.  Pertinent labs & imaging results that were available during my care of the patient were reviewed by me and considered in my medical decision making (see chart for details).  Clinical Course as of Apr 03 1735  Tue Apr 02, 2018  1153 Ambulatory pulse ox 94 and above.  Patient ambulated without tachypnea or difficulty.   [AM]      Clinical Course User Index [AM] Albesa Seen, PA-C    Patient is nontoxic-appearing, afebrile, and neurologically intact.  Patient with multiple symptoms likely contributing to discomfort.  Patient has significant sinus congestion as well as wheezes and rales on examination.  Suspect tension type headache given the pattern of pain.  Doubt temporal arteritis, as patient is not focally tender over temporal artery, patient has had ongoing symptoms for 8 to 9 days, and has no visual disturbances.  Doubt meningismus, this patient has no nuchal rigidity or fever.  No focal neurologic deficits to suggest space-occupying lesion.  No thunderclap origin of the headache to suggest acute subarachnoid hemorrhage.  Chest x-ray demonstrates likely right middle lobe infiltrate.  Will treat with levofloxacin, treatment selected with consultation with attending physician.  Discussed close follow-up with primary care provider to discuss pulmonary management.  Patient has been without inhaler given patient's thinning of fingernails, suspect there may be an element of clubbing related to chronic asthma.  Patient had normal oxygen saturations on ambulation today.  Patient was instructed to follow-up in 3 weeks with repeat chest x-ray.  Patient was given return precautions for any increasing shortness of breath, chest pain, focal weakness or numbness, syncope or presyncope, or fever or chills.  Patient is in understanding and agrees with the plan of care.  This is a shared visit with Dr. Julianne Rice. Patient was independently evaluated by this attending physician. Attending physician consulted in evaluation and discharge management.  Final Clinical Impressions(s) / ED Diagnoses   Final diagnoses:  Bad headache  Community acquired pneumonia of right middle lobe of lung ([place])  Elevated blood pressure reading without diagnosis of hypertension      Tamala Julian 04/02/18 1740    Julianne Rice,  MD 04/05/18 [id number]

## 2018-04-02 NOTE — ED Notes (Signed)
Pt ambulated with a steady gait. Oxygen level stayed at 80 while ambulating. Pt did not complain of any dizziness while ambulating.

## 2018-05-14 ENCOUNTER — Other Ambulatory Visit: Payer: Self-pay

## 2018-05-14 ENCOUNTER — Encounter: Payer: Self-pay | Admitting: Family Medicine

## 2018-05-14 ENCOUNTER — Ambulatory Visit (INDEPENDENT_AMBULATORY_CARE_PROVIDER_SITE_OTHER): Payer: Medicare HMO | Admitting: Family Medicine

## 2018-05-14 VITALS — BP 150/78 | HR 98 | Temp 98.7°F | Ht 65.0 in | Wt 165.0 lb

## 2018-05-14 DIAGNOSIS — Z720 Tobacco use: Secondary | ICD-10-CM

## 2018-05-14 DIAGNOSIS — F4310 Post-traumatic stress disorder, unspecified: Secondary | ICD-10-CM

## 2018-05-14 DIAGNOSIS — G8929 Other chronic pain: Secondary | ICD-10-CM

## 2018-05-14 DIAGNOSIS — Z1211 Encounter for screening for malignant neoplasm of colon: Secondary | ICD-10-CM | POA: Diagnosis not present

## 2018-05-14 DIAGNOSIS — E785 Hyperlipidemia, unspecified: Secondary | ICD-10-CM

## 2018-05-14 DIAGNOSIS — F17210 Nicotine dependence, cigarettes, uncomplicated: Secondary | ICD-10-CM

## 2018-05-14 DIAGNOSIS — I10 Essential (primary) hypertension: Secondary | ICD-10-CM

## 2018-05-14 DIAGNOSIS — E1165 Type 2 diabetes mellitus with hyperglycemia: Secondary | ICD-10-CM

## 2018-05-14 DIAGNOSIS — M545 Low back pain: Secondary | ICD-10-CM

## 2018-05-14 DIAGNOSIS — J449 Chronic obstructive pulmonary disease, unspecified: Secondary | ICD-10-CM | POA: Insufficient documentation

## 2018-05-14 DIAGNOSIS — Z794 Long term (current) use of insulin: Secondary | ICD-10-CM | POA: Diagnosis not present

## 2018-05-14 DIAGNOSIS — J4520 Mild intermittent asthma, uncomplicated: Secondary | ICD-10-CM

## 2018-05-14 HISTORY — DX: Post-traumatic stress disorder, unspecified: F43.10

## 2018-05-14 HISTORY — DX: Essential (primary) hypertension: I10

## 2018-05-14 LAB — POCT GLYCOSYLATED HEMOGLOBIN (HGB A1C): Hemoglobin A1C: 8.8 % — AB (ref 4.0–5.6)

## 2018-05-14 MED ORDER — SERTRALINE HCL 25 MG PO TABS: 25.0000 mg | ORAL_TABLET | Freq: Every day | ORAL | 0 refills | Status: DC

## 2018-05-14 MED ORDER — LANTUS SOLOSTAR 100 UNIT/ML ~~LOC~~ SOPN: 40.0000 [IU] | PEN_INJECTOR | Freq: Every day | SUBCUTANEOUS | 4 refills | Status: DC
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
## 2018-05-14 NOTE — Patient Instructions (Addendum)
Increase lantus by 2 units every 5 days, goal for fasting is 90-130.   For therapy -- 1. [facility] ([place], [place], [place]) [phone]  2. [facility]) [phone] 3. [facility] - [phone] 4. [facility] - [phone] (do not file insurance) 5. [facility] accepts most major insurances. [phone] or email [email or web address]    IF you received an x-ray today, you will receive an invoice from [facility]. Please contact [facility] at [phone] with questions or concerns regarding your invoice.   IF you received labwork today, you will receive an invoice from [facility]. Please contact [facility] at [phone] with questions or concerns regarding your invoice.   Our billing staff will not be able to assist you with questions regarding bills from these companies.  You will be contacted with the lab results as soon as they are available. The fastest way to get your results is to activate your My Chart account. Instructions are located on the last page of this paperwork. If you have not heard from [place] regarding the results in 2 weeks, please contact this office.

## 2018-05-15 ENCOUNTER — Encounter: Payer: Self-pay | Admitting: Family Medicine

## 2018-05-15 DIAGNOSIS — G8929 Other chronic pain: Secondary | ICD-10-CM | POA: Insufficient documentation

## 2018-05-15 DIAGNOSIS — M545 Low back pain: Secondary | ICD-10-CM

## 2018-05-15 LAB — COMPREHENSIVE METABOLIC PANEL
ALT: 17 IU/L (ref 0–32)
AST: 21 IU/L (ref 0–40)
Albumin/Globulin Ratio: 1.5 (ref 1.2–2.2)
Albumin: 4.8 g/dL (ref 3.5–5.5)
Alkaline Phosphatase: 77 IU/L (ref 39–117)
BUN/Creatinine Ratio: 12 (ref 9–23)
BUN: 9 mg/dL (ref 6–24)
Bilirubin Total: <0.2 mg/dL (ref 0.0–1.2)
CO2: 21 mmol/L (ref 20–29)
Calcium: 9.8 mg/dL (ref 8.7–10.2)
Chloride: 99 mmol/L (ref 96–106)
Creatinine, Ser: 0.73 mg/dL (ref 0.57–1.00)
GFR calc Af Amer: 108 mL/min/{1.73_m2} (ref >59)
GFR calc non Af Amer: 94 mL/min/{1.73_m2} (ref >59)
Globulin, Total: 3.2 g/dL (ref 1.5–4.5)
Glucose: 122 mg/dL — ABNORMAL HIGH (ref 65–99)
Potassium: 4 mmol/L (ref 3.5–5.2)
Sodium: 141 mmol/L (ref 134–144)
Total Protein: 8 g/dL (ref 6.0–8.5)

## 2018-05-15 LAB — LIPID PANEL
Chol/HDL Ratio: 4.6 ratio — ABNORMAL HIGH (ref 0.0–4.4)
Cholesterol, Total: 233 mg/dL — ABNORMAL HIGH (ref 100–199)
HDL: 51 mg/dL (ref >39)
LDL Calculated: 129 mg/dL — ABNORMAL HIGH (ref 0–99)
Triglycerides: 266 mg/dL — ABNORMAL HIGH (ref 0–149)
VLDL Cholesterol Cal: 53 mg/dL — ABNORMAL HIGH (ref 5–40)

## 2018-05-15 LAB — CBC
Hematocrit: 48 % — ABNORMAL HIGH (ref 34.0–46.6)
Hemoglobin: 14.9 g/dL (ref 11.1–15.9)
MCH: 27.5 pg (ref 26.6–33.0)
MCHC: 31 g/dL — ABNORMAL LOW (ref 31.5–35.7)
MCV: 89 fL (ref 79–97)
Platelets: 244 10*3/uL (ref 150–450)
RBC: 5.42 x10E6/uL — ABNORMAL HIGH (ref 3.77–5.28)
RDW: 15.1 % (ref 12.3–15.4)
WBC: 12.8 10*3/uL — ABNORMAL HIGH (ref 3.4–10.8)

## 2018-05-15 LAB — MICROALBUMIN / CREATININE URINE RATIO
Creatinine, Urine: 118.3 mg/dL
Microalb/Creat Ratio: 7.9 mg/g creat (ref 0.0–30.0)
Microalbumin, Urine: 9.3 ug/mL

## 2018-05-15 LAB — TSH: TSH: 1.36 u[IU]/mL (ref 0.450–4.500)

## 2018-05-22 ENCOUNTER — Telehealth: Payer: Self-pay | Admitting: Family Medicine

## 2018-05-22 NOTE — Telephone Encounter (Signed)
Copied from [place] [id number]. Topic: General - Other >> May 22, 2018 12:26 PM Ivar Drape wrote: Reason for CRM: Patient stated she has not received a Cologuard kit that the office said they would have sent to her. Please advise.

## 2018-05-27 ENCOUNTER — Telehealth: Payer: Self-pay | Admitting: Family Medicine

## 2018-05-27 LAB — HM DIABETES EYE EXAM

## 2018-05-27 NOTE — Telephone Encounter (Signed)
Patient is needing a refill of her hydrocodone. She saw Dr. Pamella Pert on 05/14/2018 and was told to call in when she was due to get a refill of that medication.

## 2018-05-28 NOTE — Telephone Encounter (Signed)
Spoke with the lab they are going to resend to patient.

## 2018-05-29 MED ORDER — HYDROCODONE-ACETAMINOPHEN 10-325 MG PO TABS: 1.0000 | ORAL_TABLET | Freq: Four times a day (QID) | ORAL | 0 refills | Status: DC | PRN

## 2018-05-29 NOTE — Telephone Encounter (Signed)
Done pmp reviewed

## 2018-05-29 NOTE — Addendum Note (Signed)
Addended by: Rutherford Guys on: 05/29/2018 12:44 AM   Modules accepted: Orders

## 2018-05-30 ENCOUNTER — Other Ambulatory Visit: Payer: Self-pay | Admitting: *Deleted

## 2018-05-30 MED ORDER — HYDROCODONE-ACETAMINOPHEN 10-325 MG PO TABS: 1.0000 | ORAL_TABLET | Freq: Four times a day (QID) | ORAL | 0 refills | Status: DC | PRN

## 2018-06-05 ENCOUNTER — Telehealth: Payer: Self-pay | Admitting: Family Medicine

## 2018-06-18 ENCOUNTER — Ambulatory Visit: Payer: Medicare HMO | Admitting: Family Medicine

## 2018-06-28 ENCOUNTER — Telehealth: Payer: Self-pay | Admitting: Family Medicine

## 2018-06-28 ENCOUNTER — Other Ambulatory Visit: Payer: Self-pay | Admitting: Family Medicine

## 2018-06-28 NOTE — Telephone Encounter (Signed)
Copied from [place] [id number]. Topic: General - Other >> Jun 28, 2018 12:44 PM Lennox Solders wrote: Reason for CRM:pt needs refill on norco . [facility] [address]. Pt has an appt on 07-03-18

## 2018-07-01 ENCOUNTER — Other Ambulatory Visit: Payer: Self-pay | Admitting: Family Medicine

## 2018-07-01 MED ORDER — HYDROCODONE-ACETAMINOPHEN 10-325 MG PO TABS: 1.0000 | ORAL_TABLET | Freq: Four times a day (QID) | ORAL | 0 refills | Status: DC | PRN

## 2018-07-01 NOTE — Telephone Encounter (Signed)
done

## 2018-07-01 NOTE — Addendum Note (Signed)
Addended by: Rutherford Guys on: 07/01/2018 01:49 PM   Modules accepted: Orders

## 2018-07-01 NOTE — Telephone Encounter (Signed)
Copied from [place] [id number]. Topic: Quick Communication - Rx Refill/Question >> Jul 01, 2018  3:59 PM Leward Quan A wrote: Medication: baclofen (LIORESAL) 10 MG tablet  Has the patient contacted their pharmacy?  No   Preferred Pharmacy (with phone number or street name): [place], [place] [phone] (Phone) [phone] (Fax)    Agent: Please be advised that RX refills may take up to 3 business days. We ask that you follow-up with your pharmacy.

## 2018-07-01 NOTE — Telephone Encounter (Signed)
Baclofen refill Last Refill:03/29/18 # ordered 2 Last OV: 05/24/18 PCP: [place], G. L. Garcia

## 2018-07-03 ENCOUNTER — Other Ambulatory Visit: Payer: Self-pay

## 2018-07-03 ENCOUNTER — Encounter: Payer: Self-pay | Admitting: Family Medicine

## 2018-07-03 ENCOUNTER — Ambulatory Visit (INDEPENDENT_AMBULATORY_CARE_PROVIDER_SITE_OTHER): Payer: Medicare HMO | Admitting: Family Medicine

## 2018-07-03 VITALS — BP 165/83 | HR 86 | Temp 98.2°F | Ht 65.0 in | Wt 167.8 lb

## 2018-07-03 DIAGNOSIS — D72829 Elevated white blood cell count, unspecified: Secondary | ICD-10-CM

## 2018-07-03 DIAGNOSIS — E785 Hyperlipidemia, unspecified: Secondary | ICD-10-CM | POA: Diagnosis not present

## 2018-07-03 DIAGNOSIS — F4310 Post-traumatic stress disorder, unspecified: Secondary | ICD-10-CM | POA: Diagnosis not present

## 2018-07-03 DIAGNOSIS — M545 Low back pain: Secondary | ICD-10-CM

## 2018-07-03 DIAGNOSIS — Z720 Tobacco use: Secondary | ICD-10-CM

## 2018-07-03 DIAGNOSIS — G8929 Other chronic pain: Secondary | ICD-10-CM

## 2018-07-03 DIAGNOSIS — Z23 Encounter for immunization: Secondary | ICD-10-CM | POA: Diagnosis not present

## 2018-07-03 DIAGNOSIS — E1165 Type 2 diabetes mellitus with hyperglycemia: Secondary | ICD-10-CM | POA: Diagnosis not present

## 2018-07-03 DIAGNOSIS — Z5181 Encounter for therapeutic drug level monitoring: Secondary | ICD-10-CM

## 2018-07-03 DIAGNOSIS — I10 Essential (primary) hypertension: Secondary | ICD-10-CM | POA: Diagnosis not present

## 2018-07-03 MED ORDER — LISINOPRIL-HYDROCHLOROTHIAZIDE 20-12.5 MG PO TABS: 1.0000 | ORAL_TABLET | Freq: Every day | ORAL | 1 refills | Status: DC

## 2018-07-03 MED ORDER — BACLOFEN 10 MG PO TABS: 10.0000 mg | ORAL_TABLET | Freq: Three times a day (TID) | ORAL | 2 refills | Status: DC | PRN

## 2018-07-03 MED ORDER — SERTRALINE HCL 25 MG PO TABS: 25.0000 mg | ORAL_TABLET | Freq: Every day | ORAL | 1 refills | Status: DC

## 2018-07-03 NOTE — Progress Notes (Signed)
9/25/20199:15 AM  [name] 04/18/1966, 52 y.o. female [id number]  Chief Complaint  Patient presents with  . Hypertension    has been taking medication as rx'd, monitors bg daily. Received cologuard test, will be completing that nx wk  . Medication Refill    needs refill on Baclofen    HPI:   Patient is a 52 y.o. female with past medical history significant for DM2, HTN, tob use,  HLP, PTSD, asthma, chronic low back pain and GERD who presents today for followup  Asked to increase lantus at last visit Has increased lantus upto 40 units Sugars have been coming down Checks cbgs daily, yesterday 162 Lab Results  Component Value Date   HGBA1C 8.8 (A) 05/14/2018   Started sertaline 25mg  Doing really well, not crying, sleeping well Has not been taking trazodone  Not buying cig anymore down to 3 short cig a day  UDS done: today [place] CSR reviewed: aug 2019 CSA signed: aug 2019  Lab Results  Component Value Date   WBC 12.8 (H) 05/14/2018   HGB 14.9 05/14/2018   HCT 48.0 (H) 05/14/2018   MCV 89 05/14/2018   PLT 244 05/14/2018   Lab Results  Component Value Date   CHOL 233 (H) 05/14/2018   HDL 51 05/14/2018   LDLCALC 129 (H) 05/14/2018   TRIG 266 (H) 05/14/2018   CHOLHDL 4.6 (H) 05/14/2018    Fall Risk  07/03/2018 05/14/2018  Falls in the past year? No No     Depression screen [facility] 2/9 07/03/2018 05/14/2018  Decreased Interest 0 0  Down, Depressed, Hopeless 0 0  PHQ - 2 Score 0 0    No Known Allergies  Prior to Admission medications   Medication Sig Start Date End Date Taking? Authorizing Provider  ACCU-CHEK AVIVA PLUS test strip USE AS DIRECTED FOR 30 DAYS 04/07/18  Yes [provider]  albuterol (PROVENTIL HFA;VENTOLIN HFA) 108 (90 Base) MCG/ACT inhaler Inhale 1-2 puffs into the lungs every 6 (six) hours as needed for wheezing or shortness of breath. 04/02/18  Yes Murray, Alyssa B, PA-C  Aspirin-Caffeine (BAYER BACK & BODY PAIN EX ST) 500-32.5 MG TABS  Take 1-2 tablets by mouth every 4 (four) hours as needed (for pain).    Yes [provider]  baclofen (LIORESAL) 10 MG tablet Take 10 mg by mouth 3 (three) times daily as needed for spasms. 03/29/18  Yes [provider]  Cholecalciferol (VITAMIN D-3) 1000 UNITS CAPS Take 1,000 Units by mouth daily.   Yes [provider]  hydrochlorothiazide (HYDRODIURIL) 12.5 MG tablet Take 12.5 mg by mouth daily. 05/03/18  Yes [provider]  HYDROcodone-acetaminophen (NORCO) 10-325 MG tablet Take 1 tablet by mouth every 6 (six) hours as needed. 07/01/18  Yes Rutherford Guys, MD  JANUMET XR 50-500 MG TB24 Take 1 tablet by mouth 2 (two) times daily before a meal. 04/29/18  Yes [provider]  LANTUS SOLOSTAR 100 UNIT/ML Solostar Pen Inject 40 Units into the skin at bedtime. 05/14/18  Yes Rutherford Guys, MD  magnesium oxide (MAG-OX) 400 (241.3 Mg) MG tablet Take 400 tablets by mouth 2 (two) times daily. 03/06/18  Yes [provider]  montelukast (SINGULAIR) 10 MG tablet Take 10 mg by mouth every evening. 03/06/18  Yes [provider]  omeprazole (PRILOSEC) 40 MG capsule Take 40 mg by mouth daily.   Yes [provider]  RELION PEN NEEDLE 31G/8MM 31G X 8 MM MISC daily. use as directed 04/07/18  Yes [provider]  rosuvastatin (CRESTOR) 10 MG tablet Take 10 mg by mouth daily.   Yes [provider]  sertraline (ZOLOFT) 25 MG tablet TAKE 1 TABLET BY MOUTH ONCE DAILY 06/28/18  Yes Rutherford Guys, MD  traZODone (DESYREL) 50 MG tablet Take 50 mg by mouth at bedtime as needed for sleep. 03/29/18  Yes [provider]    Past Medical History:  Diagnosis Date  . Asthma   . Diabetes mellitus without complication ([place])   . GERD (gastroesophageal reflux disease)   . Heart murmur    from childhood rheumatic fever, per patient  . Hyperlipidemia   . Pneumonia 12/14  . Wears partial dentures    top and bottom partials    Past  Surgical History:  Procedure Laterality Date  . ANTERIOR LAT LUMBAR FUSION Left 07/31/2013   Procedure: ANTERIOR LATERAL LUMBAR FUSION 1 LEVEL/Left sided lumbar 3-4 lateral interbody fusion with instrumentation and allograft.;  Surgeon: Sinclair Ship, MD;  Location: [place];  Service: Orthopedics;  Laterality: Left;  Left sided lumbar 3-4 lateral interbody fusion with instrumentation and allograft.  . APPENDECTOMY  30- 40 years  . carpal tunnel     right arm  . CARPAL TUNNEL RELEASE Right 12/15/2013   Procedure: RIGHT CARPAL TUNNEL RELEASE ENDOSCOPIC;  Surgeon: Jolyn Nap, MD;  Location: [facility];  Service: Orthopedics;  Laterality: Right;  . LATERAL EPICONDYLE RELEASE Right 12/15/2013   Procedure: RIGHT ULNAR NEUROPLASTY AT ELBOW ;  Surgeon: Jolyn Nap, MD;  Location: [facility];  Service: Orthopedics;  Laterality: Right;  . LUMBAR FUSION      Social History   Tobacco Use  . Smoking status: Former Smoker    Last attempt to quit: 08/30/2016    Years since quitting: 1.8  . Smokeless tobacco: Never Used  Substance Use Topics  . Alcohol use: No    Comment: beer everynow and then per patient    Family History  Problem Relation Age of Onset  . Diabetes Mother   . Heart disease Mother     Review of Systems  Constitutional: Negative for chills and fever.  Respiratory: Negative for cough and shortness of breath.   Cardiovascular: Negative for chest pain, palpitations and leg swelling.  Gastrointestinal: Negative for abdominal pain, nausea and vomiting.     OBJECTIVE:  Blood pressure (!) 165/83, pulse 86, temperature 98.2 F (36.8 C), temperature source Oral, height 5\' 5"  (1.651 m), weight 167 lb 12.8 oz (76.1 kg), last menstrual period 03/07/2012, SpO2 92 %. Body mass index is 27.92 kg/m.   BP Readings from Last 3 Encounters:  07/03/18 (!) 165/83  05/14/18 (!) 150/78  04/02/18 (!) 162/76    Physical Exam  Constitutional: She  is oriented to person, place, and time. She appears well-developed and well-nourished.  HENT:  Head: Normocephalic and atraumatic.  Mouth/Throat: Oropharynx is clear and moist. No oropharyngeal exudate.  Eyes: Pupils are equal, round, and reactive to light. Conjunctivae and EOM are normal. No scleral icterus.  Neck: Neck supple.  Cardiovascular: Normal rate, regular rhythm and normal heart sounds. Exam reveals no gallop and no friction rub.  No murmur heard. Pulmonary/Chest: Effort normal and breath sounds normal. She has no wheezes. She has no rales.  Musculoskeletal: She exhibits no edema.  Neurological: She is alert and oriented to person, place, and time.  Skin: Skin is warm and dry.  Psychiatric: She has a normal mood and affect.  Nursing note  and vitals reviewed.   ASSESSMENT and PLAN  1. Need for prophylactic vaccination and inoculation against influenza - Flu Vaccine QUAD 36+ mos IM  2. Uncontrolled type 2 diabetes mellitus with hyperglycemia (HCC) cbgs improving, cont with titration of lantus. Cont with LFM  3. PTSD (post-traumatic stress disorder) Controlled. Continue current regime.   4. Essential hypertension, benign Uncontrolled. Adding lisinopril 20mg  daily. Discussed LFM  5. Hyperlipidemia, unspecified hyperlipidemia type On crestor 10mg  already, no known ASCVD, cont with LFM, recheck in 6 months, consider increasing given risk factors  6. Chronic midline low back pain without sciatica 7. Encounter for therapeutic drug monitoring Stable. Cont current regime - ToxASSURE Select 13 (MW), Urine  8. Leukocytosis, unspecified type - CBC with Differential/Platelet  9. Tobacco use Congratulated on decrease use, cont to wean  Other orders - baclofen (LIORESAL) 10 MG tablet; Take 1 tablet (10 mg total) by mouth 3 (three) times daily as needed. - lisinopril-hydrochlorothiazide (PRINZIDE,ZESTORETIC) 20-12.5 MG tablet; Take 1 tablet by mouth daily. - sertraline  (ZOLOFT) 25 MG tablet; Take 1 tablet (25 mg total) by mouth daily.    Return in about 3 months (around 10/02/2018) for chronic conditions.    Rutherford Guys, MD Primary Care at [facility] [place], [place]  [address] Ph.  [phone] Fax [phone]

## 2018-07-03 NOTE — Telephone Encounter (Signed)
Patient's concern/request has been addressed already by another provider or staff member. 

## 2018-07-03 NOTE — Patient Instructions (Signed)
° ° ° °  If you have lab work done today you will be contacted with your lab results within the next 2 weeks.  If you have not heard from us then please contact us. The fastest way to get your results is to register for My Chart. ° ° °IF you received an x-ray today, you will receive an invoice from [facility]. Please contact [facility] at [phone] with questions or concerns regarding your invoice.  ° °IF you received labwork today, you will receive an invoice from [facility]. Please contact [facility] at [phone] with questions or concerns regarding your invoice.  ° °Our billing staff will not be able to assist you with questions regarding bills from these companies. ° °You will be contacted with the lab results as soon as they are available. The fastest way to get your results is to activate your My Chart account. Instructions are located on the last page of this paperwork. If you have not heard from us regarding the results in 2 weeks, please contact this office. °  ° ° ° °

## 2018-07-04 LAB — CBC WITH DIFFERENTIAL/PLATELET
Basophils Absolute: 0.1 10*3/uL (ref 0.0–0.2)
Basos: 1 %
EOS (ABSOLUTE): 0.2 10*3/uL (ref 0.0–0.4)
Eos: 1 %
Hematocrit: 44.1 % (ref 34.0–46.6)
Hemoglobin: 14.5 g/dL (ref 11.1–15.9)
Immature Grans (Abs): 0.1 10*3/uL (ref 0.0–0.1)
Immature Granulocytes: 1 %
Lymphocytes Absolute: 4.1 10*3/uL — ABNORMAL HIGH (ref 0.7–3.1)
Lymphs: 31 %
MCH: 28.8 pg (ref 26.6–33.0)
MCHC: 32.9 g/dL (ref 31.5–35.7)
MCV: 88 fL (ref 79–97)
Monocytes Absolute: 0.9 10*3/uL (ref 0.1–0.9)
Monocytes: 7 %
Neutrophils Absolute: 8 10*3/uL — ABNORMAL HIGH (ref 1.4–7.0)
Neutrophils: 59 %
Platelets: 313 10*3/uL (ref 150–450)
RBC: 5.03 x10E6/uL (ref 3.77–5.28)
RDW: 16.4 % — ABNORMAL HIGH (ref 12.3–15.4)
WBC: 13.3 10*3/uL — ABNORMAL HIGH (ref 3.4–10.8)

## 2018-07-09 LAB — TOXASSURE SELECT 13 (MW), URINE

## 2018-07-12 ENCOUNTER — Telehealth: Payer: Self-pay

## 2018-07-17 ENCOUNTER — Ambulatory Visit: Payer: Medicare HMO | Admitting: Family Medicine

## 2018-07-25 ENCOUNTER — Other Ambulatory Visit: Payer: Self-pay | Admitting: Family Medicine

## 2018-07-25 NOTE — Telephone Encounter (Signed)
Requested medication (s) are due for refill today: due 07/30/18  Requested medication (s) are on the active medication list: yes    Last refill: 07/01/18  Future visit scheduled yes 08/12/18  Notes to clinic:Refill due 07/30/18  Requested Prescriptions  Pending Prescriptions Disp Refills   HYDROcodone-acetaminophen (NORCO) 10-325 MG tablet 120 tablet 0    Sig: Take 1 tablet by mouth every 6 (six) hours as needed.     Not Delegated - Analgesics:  Opioid Agonist Combinations Failed - 07/25/2018 11:59 AM      Failed - This refill cannot be delegated      Failed - Urine Drug Screen completed in last 360 days.      Passed - Valid encounter within last 6 months    Recent Outpatient Visits          3 weeks ago Need for prophylactic vaccination and inoculation against influenza   Primary Care at [facility], Lilia Argue, MD   2 months ago Uncontrolled type 2 diabetes mellitus with hyperglycemia [facility])   Primary Care at [facility], Lilia Argue, MD      Future Appointments            In 2 weeks Rutherford Guys, MD Primary Care at [place], [facility]   In 2 months Rutherford Guys, MD Primary Care at [place], [facility]

## 2018-07-25 NOTE — Telephone Encounter (Signed)
Copied from [place] [id number]. Topic: Quick Communication - Rx Refill/Question >> Jul 25, 2018 11:50 AM Leward Quan A wrote: Medication: HYDROcodone-acetaminophen (NORCO) 10-325 MG tablet  Refill due on 07/30/18, patient informed  Has the patient contacted their pharmacy? Yes.     Preferred Pharmacy (with phone number or street name): [place], [place] [phone] (Phone) [phone] (Fax)    Agent: Please be advised that RX refills may take up to 3 business days. We ask that you follow-up with your pharmacy.

## 2018-07-30 NOTE — Telephone Encounter (Signed)
Pt calling back to check status of refill-advised pt that she needs an appointment. Pt declined appointment stating she has an upcoming appointment on 11/4 and is requesting a refill until then stating she is out of this medication.

## 2018-07-30 NOTE — Telephone Encounter (Signed)
Patient requesting refill on Hydrocodone-Acetam 10-325 mg. Please advise, thank you.

## 2018-07-30 NOTE — Telephone Encounter (Signed)
Patient called in for refill of HYDROcodone-acetaminophen ([place]) 10-325 MG tablet sent to  [place], [place] [phone] (Phone) [phone] (Fax)   Stated that she is all out of medication.

## 2018-08-01 NOTE — Telephone Encounter (Signed)
DONE

## 2018-08-03 NOTE — Telephone Encounter (Signed)
Will discuss UDS and medication then

## 2018-08-12 ENCOUNTER — Encounter: Payer: Self-pay | Admitting: Family Medicine

## 2018-08-12 ENCOUNTER — Other Ambulatory Visit: Payer: Self-pay

## 2018-08-12 ENCOUNTER — Encounter

## 2018-08-12 ENCOUNTER — Ambulatory Visit (INDEPENDENT_AMBULATORY_CARE_PROVIDER_SITE_OTHER): Payer: Medicare HMO | Admitting: Family Medicine

## 2018-08-12 VITALS — BP 132/79 | HR 88 | Temp 98.5°F | Resp 12 | Ht 64.0 in | Wt 166.8 lb

## 2018-08-12 DIAGNOSIS — M545 Low back pain: Secondary | ICD-10-CM | POA: Diagnosis not present

## 2018-08-12 DIAGNOSIS — Z794 Long term (current) use of insulin: Secondary | ICD-10-CM

## 2018-08-12 DIAGNOSIS — G8929 Other chronic pain: Secondary | ICD-10-CM

## 2018-08-12 MED ORDER — HYDROCODONE-ACETAMINOPHEN 5-325 MG PO TABS: 1.0000 | ORAL_TABLET | Freq: Four times a day (QID) | ORAL | 0 refills | Status: DC | PRN

## 2018-08-12 NOTE — Patient Instructions (Signed)
° ° ° °  If you have lab work done today you will be contacted with your lab results within the next 2 weeks.  If you have not heard from us then please contact us. The fastest way to get your results is to register for My Chart. ° ° °IF you received an x-ray today, you will receive an invoice from [facility]. Please contact [facility] at [phone] with questions or concerns regarding your invoice.  ° °IF you received labwork today, you will receive an invoice from [facility]. Please contact [facility] at [phone] with questions or concerns regarding your invoice.  ° °Our billing staff will not be able to assist you with questions regarding bills from these companies. ° °You will be contacted with the lab results as soon as they are available. The fastest way to get your results is to activate your My Chart account. Instructions are located on the last page of this paperwork. If you have not heard from us regarding the results in 2 weeks, please contact this office. °  ° ° ° °

## 2018-08-12 NOTE — Progress Notes (Signed)
11/4/20198:54 AM  [name] [birth year], 52 y.o. female [id number]  Chief Complaint  Patient presents with  . Follow-up    review lab results    HPI:   Patient is a 52 y.o. female with past medical history significant for DM2, HTN. HLP, GERD, chronic pain, PTSD who presents today for followup on lab results  Reports that prior to UDS was after she was in [place] during her husband's funeral She ended up staying longer than planned and ran out other vicodin and needed something to calm down and work thru stress, etc She denies any drug use  Last dose of vicodin was on 10/25 Having withdrawls Previously on 10mg  Having new pain in neck    Fall Risk  08/12/2018 07/03/2018 05/14/2018  Falls in the past year? 0 No No     Depression screen [facility] 2/9 08/12/2018 07/03/2018 05/14/2018  Decreased Interest 0 0 0  Down, Depressed, Hopeless 0 0 0  PHQ - 2 Score 0 0 0    No Known Allergies  Prior to Admission medications   Medication Sig Start Date End Date Taking? Authorizing Provider  ACCU-CHEK AVIVA PLUS test strip USE AS DIRECTED FOR 30 DAYS 04/07/18  Yes [provider]  albuterol (PROVENTIL HFA;VENTOLIN HFA) 108 (90 Base) MCG/ACT inhaler Inhale 1-2 puffs into the lungs every 6 (six) hours as needed for wheezing or shortness of breath. 04/02/18  Yes Murray, Alyssa B, PA-C  Aspirin-Caffeine (BAYER BACK & BODY PAIN EX ST) 500-32.5 MG TABS Take 1-2 tablets by mouth every 4 (four) hours as needed (for pain).    Yes [provider]  baclofen (LIORESAL) 10 MG tablet Take 1 tablet (10 mg total) by mouth 3 (three) times daily as needed. 07/03/18  Yes Rutherford Guys, MD  Cholecalciferol (VITAMIN D-3) 1000 UNITS CAPS Take 1,000 Units by mouth daily.   Yes [provider]  HYDROcodone-acetaminophen (NORCO) 10-325 MG tablet Take 1 tablet by mouth every 6 (six) hours as needed. 07/01/18  Yes Rutherford Guys, MD  JANUMET XR 50-500 MG TB24 Take 1 tablet by mouth 2 (two) times  daily before a meal. 04/29/18  Yes [provider]  LANTUS SOLOSTAR 100 UNIT/ML Solostar Pen Inject 40 Units into the skin at bedtime. 05/14/18  Yes Rutherford Guys, MD  lisinopril-hydrochlorothiazide (PRINZIDE,ZESTORETIC) 20-12.5 MG tablet Take 1 tablet by mouth daily. 07/03/18  Yes Rutherford Guys, MD  magnesium oxide (MAG-OX) 400 (241.3 Mg) MG tablet Take 400 tablets by mouth 2 (two) times daily. 03/06/18  Yes [provider]  montelukast (SINGULAIR) 10 MG tablet Take 10 mg by mouth every evening. 03/06/18  Yes [provider]  omeprazole (PRILOSEC) 40 MG capsule Take 40 mg by mouth daily.   Yes [provider]  RELION PEN NEEDLE 31G/8MM 31G X 8 MM MISC daily. use as directed 04/07/18  Yes [provider]  rosuvastatin (CRESTOR) 10 MG tablet Take 10 mg by mouth daily.   Yes [provider]  sertraline (ZOLOFT) 25 MG tablet Take 1 tablet (25 mg total) by mouth daily. 07/03/18  Yes Rutherford Guys, MD    Past Medical History:  Diagnosis Date  . Asthma   . Diabetes mellitus without complication ([place])   . GERD (gastroesophageal reflux disease)   . Heart murmur    from childhood rheumatic fever, per patient  . Hyperlipidemia   . Pneumonia 12/14  . Wears partial dentures    top and bottom partials  Past Surgical History:  Procedure Laterality Date  . ANTERIOR LAT LUMBAR FUSION Left 07/31/2013   Procedure: ANTERIOR LATERAL LUMBAR FUSION 1 LEVEL/Left sided lumbar 3-4 lateral interbody fusion with instrumentation and allograft.;  Surgeon: Sinclair Ship, MD;  Location: [facility];  Service: Orthopedics;  Laterality: Left;  Left sided lumbar 3-4 lateral interbody fusion with instrumentation and allograft.  . APPENDECTOMY  30- 40 years  . carpal tunnel     right arm  . CARPAL TUNNEL RELEASE Right 12/15/2013   Procedure: RIGHT CARPAL TUNNEL RELEASE ENDOSCOPIC;  Surgeon: Jolyn Nap, MD;  Location: [facility];  Service:  Orthopedics;  Laterality: Right;  . LATERAL EPICONDYLE RELEASE Right 12/15/2013   Procedure: RIGHT ULNAR NEUROPLASTY AT ELBOW ;  Surgeon: Jolyn Nap, MD;  Location: [facility];  Service: Orthopedics;  Laterality: Right;  . LUMBAR FUSION      Social History   Tobacco Use  . Smoking status: Former Smoker    Last attempt to quit: 08/30/2016    Years since quitting: 1.9  . Smokeless tobacco: Never Used  Substance Use Topics  . Alcohol use: No    Comment: beer everynow and then per patient    Family History  Problem Relation Age of Onset  . Diabetes Mother   . Heart disease Mother     ROS Per hpi  OBJECTIVE:  Blood pressure 132/79, pulse 88, temperature 98.5 F (36.9 C), temperature source Oral, resp. rate 12, height 5\' 4"  (1.626 m), weight 166 lb 12.8 oz (75.7 kg), last menstrual period 03/07/2012, SpO2 95 %. Body mass index is 28.63 kg/m.   Physical Exam  Constitutional: She is oriented to person, place, and time. She appears well-developed and well-nourished.  HENT:  Head: Normocephalic and atraumatic.  Mouth/Throat: Oropharynx is clear and moist. No oropharyngeal exudate.  Eyes: Pupils are equal, round, and reactive to light. Conjunctivae and EOM are normal. No scleral icterus.  Neck: Neck supple.  Cardiovascular: Normal rate, regular rhythm and normal heart sounds. Exam reveals no gallop and no friction rub.  No murmur heard. Pulmonary/Chest: Effort normal and breath sounds normal. She has no wheezes. She has no rales.  Musculoskeletal: She exhibits no edema.  Neurological: She is alert and oriented to person, place, and time.  Skin: Skin is warm and dry.  Psychiatric: She has a normal mood and affect.  Nursing note and vitals reviewed.    ASSESSMENT and PLAN  1. Chronic midline low back pain without sciatica - ToxASSURE Select 13 (MW), Urine  2. Encounter for long-term (current) use of insulin ([place]) - ToxASSURE Select 13 (MW), Urine  More  than 50% of this 25 miniute visit was spent discussing providing counseling regarding CSA. Reiterated with patients very clear expectations regarding use of substances not prescribed to her. Will recheck UDS today and will continue to randomly monitor. Restarting vicodin at lower dose. reeval at next visit. Will see if neck continues to be an issue then  Other orders - HYDROcodone-acetaminophen (NORCO/VICODIN) 5-325 MG tablet; Take 1 tablet by mouth every 6 (six) hours as needed for moderate pain.  Return in about 4 weeks (around 09/09/2018) for chronic pain meds.    Rutherford Guys, MD Primary Care at [facility] [place], [place] [address] Ph.  [phone] Fax [phone]

## 2018-08-15 LAB — TOXASSURE SELECT 13 (MW), URINE

## 2018-09-11 ENCOUNTER — Ambulatory Visit (INDEPENDENT_AMBULATORY_CARE_PROVIDER_SITE_OTHER): Payer: Medicare HMO | Admitting: Family Medicine

## 2018-09-11 ENCOUNTER — Other Ambulatory Visit: Payer: Self-pay

## 2018-09-11 ENCOUNTER — Encounter: Payer: Self-pay | Admitting: Family Medicine

## 2018-09-11 ENCOUNTER — Ambulatory Visit (INDEPENDENT_AMBULATORY_CARE_PROVIDER_SITE_OTHER): Payer: Medicare HMO

## 2018-09-11 VITALS — BP 118/77 | HR 91 | Temp 98.6°F | Ht 64.0 in | Wt 162.0 lb

## 2018-09-11 DIAGNOSIS — J181 Lobar pneumonia, unspecified organism: Secondary | ICD-10-CM | POA: Diagnosis not present

## 2018-09-11 DIAGNOSIS — R05 Cough: Secondary | ICD-10-CM | POA: Diagnosis not present

## 2018-09-11 DIAGNOSIS — R1011 Right upper quadrant pain: Secondary | ICD-10-CM | POA: Diagnosis not present

## 2018-09-11 DIAGNOSIS — R059 Cough, unspecified: Secondary | ICD-10-CM

## 2018-09-11 DIAGNOSIS — R3121 Asymptomatic microscopic hematuria: Secondary | ICD-10-CM | POA: Diagnosis not present

## 2018-09-11 DIAGNOSIS — E1165 Type 2 diabetes mellitus with hyperglycemia: Secondary | ICD-10-CM

## 2018-09-11 DIAGNOSIS — Z5181 Encounter for therapeutic drug level monitoring: Secondary | ICD-10-CM

## 2018-09-11 DIAGNOSIS — J189 Pneumonia, unspecified organism: Secondary | ICD-10-CM

## 2018-09-11 LAB — POCT CBC
Granulocyte percent: 51.8 %G (ref 37–80)
HCT, POC: 43.9 % — AB (ref 29–41)
Hemoglobin: 15 g/dL — AB (ref 9.5–13.5)
Lymph, poc: 5.7 — AB (ref 0.6–3.4)
MCH, POC: 31.1 pg (ref 27–31.2)
MCHC: 34.1 g/dL (ref 31.8–35.4)
MCV: 91.2 fL (ref 76–111)
MID (cbc): 0.6 (ref 0–0.9)
MPV: 8.4 fL (ref 0–99.8)
POC Granulocyte: 6.8 (ref 2–6.9)
POC LYMPH PERCENT: 43.4 %L (ref 10–50)
POC MID %: 4.8 %M (ref 0–12)
Platelet Count, POC: 337 10*3/uL (ref 142–424)
RBC: 4.82 M/uL (ref 4.04–5.48)
RDW, POC: 13.2 %
WBC: 13.2 10*3/uL — AB (ref 4.6–10.2)

## 2018-09-11 LAB — POCT URINALYSIS DIP (MANUAL ENTRY)
Bilirubin, UA: NEGATIVE
Glucose, UA: NEGATIVE mg/dL
Ketones, POC UA: NEGATIVE mg/dL
Leukocytes, UA: NEGATIVE
Nitrite, UA: NEGATIVE
Spec Grav, UA: 1.015 (ref 1.010–1.025)
Urobilinogen, UA: 0.2 E.U./dL
pH, UA: 6 (ref 5.0–8.0)

## 2018-09-11 LAB — POC MICROSCOPIC URINALYSIS (UMFC): Mucus: ABSENT

## 2018-09-11 MED ORDER — AMOXICILLIN-POT CLAVULANATE ER 1000-62.5 MG PO TB12: 2.0000 | ORAL_TABLET | Freq: Two times a day (BID) | ORAL | 0 refills | Status: DC

## 2018-09-11 MED ORDER — HYDROCODONE-ACETAMINOPHEN 10-325 MG PO TABS: 1.0000 | ORAL_TABLET | Freq: Four times a day (QID) | ORAL | 0 refills | Status: DC | PRN

## 2018-09-11 MED ORDER — DOXYCYCLINE HYCLATE 100 MG PO TABS: 100.0000 mg | ORAL_TABLET | Freq: Two times a day (BID) | ORAL | 0 refills | Status: DC

## 2018-09-11 MED ORDER — CEFPODOXIME PROXETIL 200 MG PO TABS: 200.0000 mg | ORAL_TABLET | Freq: Two times a day (BID) | ORAL | 0 refills | Status: DC

## 2018-09-11 NOTE — Patient Instructions (Signed)
° ° ° °  If you have lab work done today you will be contacted with your lab results within the next 2 weeks.  If you have not heard from us then please contact us. The fastest way to get your results is to register for My Chart. ° ° °IF you received an x-ray today, you will receive an invoice from [facility]. Please contact [facility] at [phone] with questions or concerns regarding your invoice.  ° °IF you received labwork today, you will receive an invoice from [facility]. Please contact [facility] at [phone] with questions or concerns regarding your invoice.  ° °Our billing staff will not be able to assist you with questions regarding bills from these companies. ° °You will be contacted with the lab results as soon as they are available. The fastest way to get your results is to activate your My Chart account. Instructions are located on the last page of this paperwork. If you have not heard from us regarding the results in 2 weeks, please contact this office. °  ° ° ° °

## 2018-09-11 NOTE — Progress Notes (Signed)
[name]

## 2018-09-11 NOTE — Progress Notes (Signed)
12/4/20192:05 PM  Sharen Hint 06-Nov-1965, 52 y.o. female [id number]  Chief Complaint  Patient presents with  . Back Pain    upper back toward the neck  . Abdominal Pain    has not being able to consume solid food due to the pain    HPI:   Patient is a 52 y.o. female with past medical history significant for DM2, HTN. HLP, GERD, chronic pain, PTSD who presents today for followup   Cough for about a week Right upper back pain that radiates to the front Non productive cough, no pain with breathing Has been using albuterol more often No fever but having chills Had diarrhea for 2-3 days Has only been able to do liquid diet because everything else makes RUQ pain, sharp GB still in place Denies any dysuria, hematuria, urgency  Also requesting refill of vicodin with increase back to her original dose of 10-325mg    Lab Results  Component Value Date   HGBA1C 8.8 (A) 05/14/2018   Lab Results  Component Value Date   LDLCALC 129 (H) 05/14/2018   CREATININE 0.73 05/14/2018     Fall Risk  09/11/2018 08/12/2018 07/03/2018 05/14/2018  Falls in the past year? 0 0 No No     Depression screen [facility] 2/9 08/12/2018 07/03/2018 05/14/2018  Decreased Interest 0 0 0  Down, Depressed, Hopeless 0 0 0  PHQ - 2 Score 0 0 0    No Known Allergies  Prior to Admission medications   Medication Sig Start Date End Date Taking? Authorizing Provider  ACCU-CHEK AVIVA PLUS test strip USE AS DIRECTED FOR 30 DAYS 04/07/18  Yes [provider]  albuterol (PROVENTIL HFA;VENTOLIN HFA) 108 (90 Base) MCG/ACT inhaler Inhale 1-2 puffs into the lungs every 6 (six) hours as needed for wheezing or shortness of breath. 04/02/18  Yes Murray, Alyssa B, PA-C  Aspirin-Caffeine (BAYER BACK & BODY PAIN EX ST) 500-32.5 MG TABS Take 1-2 tablets by mouth every 4 (four) hours as needed (for pain).    Yes [provider]  baclofen (LIORESAL) 10 MG tablet Take 1 tablet (10 mg total) by mouth 3 (three) times daily  as needed. 07/03/18  Yes Rutherford Guys, MD  Cholecalciferol (VITAMIN D-3) 1000 UNITS CAPS Take 1,000 Units by mouth daily.   Yes [provider]  HYDROcodone-acetaminophen (NORCO/VICODIN) 5-325 MG tablet Take 1 tablet by mouth every 6 (six) hours as needed for moderate pain. 08/12/18  Yes Rutherford Guys, MD  JANUMET XR 50-500 MG TB24 Take 1 tablet by mouth 2 (two) times daily before a meal. 04/29/18  Yes [provider]  LANTUS SOLOSTAR 100 UNIT/ML Solostar Pen Inject 40 Units into the skin at bedtime. 05/14/18  Yes Rutherford Guys, MD  lisinopril-hydrochlorothiazide (PRINZIDE,ZESTORETIC) 20-12.5 MG tablet Take 1 tablet by mouth daily. 07/03/18  Yes Rutherford Guys, MD  magnesium oxide (MAG-OX) 400 (241.3 Mg) MG tablet Take 400 tablets by mouth 2 (two) times daily. 03/06/18  Yes [provider]  montelukast (SINGULAIR) 10 MG tablet Take 10 mg by mouth every evening. 03/06/18  Yes [provider]  omeprazole (PRILOSEC) 40 MG capsule Take 40 mg by mouth daily.   Yes [provider]  RELION PEN NEEDLE 31G/8MM 31G X 8 MM MISC daily. use as directed 04/07/18  Yes [provider]  rosuvastatin (CRESTOR) 10 MG tablet Take 10 mg by mouth daily.   Yes [provider]  sertraline (ZOLOFT) 25 MG tablet Take 1 tablet (25 mg total)  by mouth daily. 07/03/18  Yes Rutherford Guys, MD    Past Medical History:  Diagnosis Date  . Asthma   . Diabetes mellitus without complication ([place])   . GERD (gastroesophageal reflux disease)   . Heart murmur    from childhood rheumatic fever, per patient  . Hyperlipidemia   . Pneumonia 12/14  . Wears partial dentures    top and bottom partials    Past Surgical History:  Procedure Laterality Date  . ANTERIOR LAT LUMBAR FUSION Left 07/31/2013   Procedure: ANTERIOR LATERAL LUMBAR FUSION 1 LEVEL/Left sided lumbar 3-4 lateral interbody fusion with instrumentation and allograft.;  Surgeon: Sinclair Ship, MD;   Location: [facility];  Service: Orthopedics;  Laterality: Left;  Left sided lumbar 3-4 lateral interbody fusion with instrumentation and allograft.  . APPENDECTOMY  30- 40 years  . carpal tunnel     right arm  . CARPAL TUNNEL RELEASE Right 12/15/2013   Procedure: RIGHT CARPAL TUNNEL RELEASE ENDOSCOPIC;  Surgeon: Jolyn Nap, MD;  Location: [facility];  Service: Orthopedics;  Laterality: Right;  . LATERAL EPICONDYLE RELEASE Right 12/15/2013   Procedure: RIGHT ULNAR NEUROPLASTY AT ELBOW ;  Surgeon: Jolyn Nap, MD;  Location: [facility];  Service: Orthopedics;  Laterality: Right;  . LUMBAR FUSION      Social History   Tobacco Use  . Smoking status: Former Smoker    Last attempt to quit: 08/30/2016    Years since quitting: 2.0  . Smokeless tobacco: Never Used  Substance Use Topics  . Alcohol use: No    Comment: beer everynow and then per patient    Family History  Problem Relation Age of Onset  . Diabetes Mother   . Heart disease Mother     ROS Per hpi  OBJECTIVE:  Blood pressure 118/77, pulse 91, temperature 98.6 F (37 C), temperature source Oral, height 5\' 4"  (1.626 m), weight 162 lb (73.5 kg), last menstrual period 03/07/2012, SpO2 93 %. Body mass index is 27.81 kg/m.   Physical Exam  Constitutional: She is oriented to person, place, and time. She appears well-developed and well-nourished.  HENT:  Head: Normocephalic and atraumatic.  Right Ear: Hearing, tympanic membrane, external ear and ear canal normal.  Left Ear: Hearing, tympanic membrane, external ear and ear canal normal.  Mouth/Throat: Oropharynx is clear and moist.  Eyes: Pupils are equal, round, and reactive to light. Conjunctivae and EOM are normal.  Neck: Neck supple.  Cardiovascular: Normal rate, regular rhythm and normal heart sounds. Exam reveals no gallop and no friction rub.  No murmur heard. Pulmonary/Chest: Effort normal. She has no wheezes. She has no rhonchi. She  has rales (RLL and RML).  Abdominal: Soft. Bowel sounds are normal. There is no hepatosplenomegaly. There is generalized tenderness (most pronounced RUQ). There is no CVA tenderness and negative Murphy's sign.  Musculoskeletal: She exhibits no edema.  Lymphadenopathy:    She has no cervical adenopathy.  Neurological: She is alert and oriented to person, place, and time.  Skin: Skin is warm and dry.  Psychiatric: She has a normal mood and affect.  Nursing note and vitals reviewed.     Results for orders placed or performed in visit on 09/11/18 (from the past 24 hour(s))  POCT urinalysis dipstick     Status: Abnormal   Collection Time: 09/11/18  1:58 PM  Result Value Ref Range   Color, UA yellow yellow   Clarity, UA cloudy (A) clear   Glucose,  UA negative negative mg/dL   Bilirubin, UA negative negative   Ketones, POC UA negative negative mg/dL   Spec Grav, UA 1.015 1.010 - 1.025   Blood, UA trace-lysed (A) negative   pH, UA 6.0 5.0 - 8.0   Protein Ur, POC trace (A) negative mg/dL   Urobilinogen, UA 0.2 0.2 or 1.0 E.U./dL   Nitrite, UA Negative Negative   Leukocytes, UA Negative Negative  POCT CBC     Status: Abnormal   Collection Time: 09/11/18  2:36 PM  Result Value Ref Range   WBC 13.2 (A) 4.6 - 10.2 K/uL   Lymph, poc 5.7 (A) 0.6 - 3.4   POC LYMPH PERCENT 43.4 10 - 50 %L   MID (cbc) 0.6 0 - 0.9   POC MID % 4.8 0 - 12 %M   POC Granulocyte 6.8 2 - 6.9   Granulocyte percent 51.8 37 - 80 %G   RBC 4.82 4.04 - 5.48 M/uL   Hemoglobin 15.0 (A) 9.5 - 13.5 g/dL   HCT, POC 43.9 (A) 29 - 41 %   MCV 91.2 76 - 111 fL   MCH, POC 31.1 27 - 31.2 pg   MCHC 34.1 31.8 - 35.4 g/dL   RDW, POC 13.2 %   Platelet Count, POC 337 142 - 424 K/uL   MPV 8.4 0 - 99.8 fL  POCT Microscopic Urinalysis ([facility])     Status: Abnormal   Collection Time: 09/11/18  2:55 PM  Result Value Ref Range   WBC,UR,HPF,POC None None WBC/hpf   RBC,UR,HPF,POC None None RBC/hpf   Bacteria Too numerous to count  None,  Too numerous to count   Mucus Absent Absent   Epithelial Cells, UR Per Microscopy Few (A) None, Too numerous to count cells/hpf    Dg Chest 2 View  Result Date: 09/11/2018 CLINICAL DATA:  52 year old female with a history of cough EXAM: CHEST - 2 VIEW COMPARISON:  None. FINDINGS: Cardiomediastinal silhouette within normal limits in size and contour. No evidence of pneumothorax. No pleural effusion. No confluent airspace disease. There is mild reticulonodular opacity, particularly at the lower lungs and most pronounced on the right. No displaced fracture IMPRESSION: Pattern of reticulonodular opacity, most pronounced on the right, may represent chronic changes in this patient with history of smoking, or alternatively developing infection. Electronically Signed   By: Corrie Mckusick D.O.   On: 09/11/2018 14:55     ASSESSMENT and PLAN  1. Community acquired pneumonia of right middle lobe of lung ([place]) Discussed supportive measures, new meds r/se/b and RTC precautions.   2. Encounter for therapeutic drug monitoring - ToxASSURE Select 13 (MW), Urine  3. Right upper quadrant abdominal pain - POCT urinalysis dipstick - Comprehensive metabolic panel  4. Cough - POCT CBC - DG Chest 2 View; Future  5. Uncontrolled type 2 diabetes mellitus with hyperglycemia ([place]) Checking labs today, medications will be adjusted as needed.  - Hemoglobin A1c  6. Asymptomatic microscopic hematuria - POCT Microscopic Urinalysis ([facility]) - no RBC seen  Other orders - cefpodoxime (VANTIN) 200 MG tablet; Take 1 tablet (200 mg total) by mouth 2 (two) times daily. - HYDROcodone-acetaminophen (NORCO) 10-325 MG tablet; Take 1 tablet by mouth every 6 (six) hours as needed. - doxycycline (VIBRA-TABS) 100 MG tablet; Take 1 tablet (100 mg total) by mouth 2 (two) times daily.  Return for as scheduled.    Rutherford Guys, MD Primary Care at [facility] [place], [place] [address] Ph.  [phone] Fax  [phone]   

## 2018-09-12 LAB — COMPREHENSIVE METABOLIC PANEL
ALT: 18 IU/L (ref 0–32)
AST: 21 IU/L (ref 0–40)
Albumin/Globulin Ratio: 1.4 (ref 1.2–2.2)
Albumin: 4.4 g/dL (ref 3.5–5.5)
Alkaline Phosphatase: 83 IU/L (ref 39–117)
BUN/Creatinine Ratio: 7 — ABNORMAL LOW (ref 9–23)
BUN: 7 mg/dL (ref 6–24)
Bilirubin Total: <0.2 mg/dL (ref 0.0–1.2)
CO2: 22 mmol/L (ref 20–29)
Calcium: 10 mg/dL (ref 8.7–10.2)
Chloride: 98 mmol/L (ref 96–106)
Creatinine, Ser: 0.96 mg/dL (ref 0.57–1.00)
GFR calc Af Amer: 78 mL/min/{1.73_m2} (ref >59)
GFR calc non Af Amer: 67 mL/min/{1.73_m2} (ref >59)
Globulin, Total: 3.2 g/dL (ref 1.5–4.5)
Glucose: 136 mg/dL — ABNORMAL HIGH (ref 65–99)
Potassium: 4.1 mmol/L (ref 3.5–5.2)
Sodium: 139 mmol/L (ref 134–144)
Total Protein: 7.6 g/dL (ref 6.0–8.5)

## 2018-09-12 LAB — HEMOGLOBIN A1C
Est. average glucose Bld gHb Est-mCnc: 171 mg/dL
Hgb A1c MFr Bld: 7.6 % — ABNORMAL HIGH (ref 4.8–5.6)

## 2018-09-16 LAB — TOXASSURE SELECT 13 (MW), URINE

## 2018-09-24 ENCOUNTER — Telehealth: Payer: Self-pay | Admitting: Family Medicine

## 2018-09-24 NOTE — Telephone Encounter (Signed)
Copied from [place] [id number]. Topic: General - Other >> Sep 24, 2018  5:03 PM Keene Breath wrote: Reason for CRM: Lester [place]ee, with [facility] called to request that the patient's office notes and labs from visit on 05/14/18 be refaxed to her office.  Some of the pages were not clear and also she needs a copy of the patient's cologard test results as well.  The fax # is [phone].  If you have any questions please call at [phone], ext. [phone]

## 2018-09-25 NOTE — Telephone Encounter (Signed)
Resent to [facility]

## 2018-10-01 ENCOUNTER — Other Ambulatory Visit: Payer: Self-pay

## 2018-10-01 ENCOUNTER — Encounter: Payer: Self-pay | Admitting: Family Medicine

## 2018-10-01 ENCOUNTER — Other Ambulatory Visit: Payer: Self-pay | Admitting: *Deleted

## 2018-10-01 ENCOUNTER — Ambulatory Visit (INDEPENDENT_AMBULATORY_CARE_PROVIDER_SITE_OTHER): Payer: Medicare HMO | Admitting: Family Medicine

## 2018-10-01 VITALS — BP 144/88 | HR 83 | Temp 98.3°F | Ht 64.0 in | Wt 160.4 lb

## 2018-10-01 DIAGNOSIS — G8929 Other chronic pain: Secondary | ICD-10-CM

## 2018-10-01 DIAGNOSIS — M545 Low back pain: Secondary | ICD-10-CM

## 2018-10-01 DIAGNOSIS — F1490 Cocaine use, unspecified, uncomplicated: Secondary | ICD-10-CM

## 2018-10-01 DIAGNOSIS — Z79891 Long term (current) use of opiate analgesic: Secondary | ICD-10-CM | POA: Diagnosis not present

## 2018-10-01 MED ORDER — SERTRALINE HCL 25 MG PO TABS: 25.0000 mg | ORAL_TABLET | Freq: Every day | ORAL | 1 refills | Status: DC

## 2018-10-01 MED ORDER — LANTUS SOLOSTAR 100 UNIT/ML ~~LOC~~ SOPN: 40.0000 [IU] | PEN_INJECTOR | Freq: Every day | SUBCUTANEOUS | 4 refills | Status: DC

## 2018-10-01 MED ORDER — OMEPRAZOLE 40 MG PO CPDR: 40.0000 mg | DELAYED_RELEASE_CAPSULE | Freq: Every day | ORAL | 3 refills | Status: DC

## 2018-10-01 MED ORDER — MONTELUKAST SODIUM 10 MG PO TABS: 10.0000 mg | ORAL_TABLET | Freq: Every evening | ORAL | 3 refills | Status: DC

## 2018-10-01 MED ORDER — CLONIDINE HCL 0.1 MG PO TABS: 0.1000 mg | ORAL_TABLET | Freq: Three times a day (TID) | ORAL | 0 refills | Status: DC

## 2018-10-01 MED ORDER — ROSUVASTATIN CALCIUM 10 MG PO TABS: 10.0000 mg | ORAL_TABLET | Freq: Every day | ORAL | 3 refills | Status: DC

## 2018-10-01 MED ORDER — HYDROXYZINE HCL 25 MG PO TABS: 25.0000 mg | ORAL_TABLET | Freq: Three times a day (TID) | ORAL | 0 refills | Status: DC | PRN

## 2018-10-01 MED ORDER — RELION PEN NEEDLES 31G X 8 MM MISC: 1.0000 | Freq: Every day | 2 refills | Status: AC

## 2018-10-01 MED ORDER — LISINOPRIL-HYDROCHLOROTHIAZIDE 20-12.5 MG PO TABS: 1.0000 | ORAL_TABLET | Freq: Every day | ORAL | 1 refills | Status: DC

## 2018-10-01 MED ORDER — BACLOFEN 10 MG PO TABS: 10.0000 mg | ORAL_TABLET | Freq: Three times a day (TID) | ORAL | 2 refills | Status: DC | PRN

## 2018-10-01 MED ORDER — JANUMET XR 50-500 MG PO TB24: 1.0000 | ORAL_TABLET | Freq: Two times a day (BID) | ORAL | 3 refills | Status: DC

## 2018-10-01 NOTE — Progress Notes (Signed)
12/24/20199:00 AM  [name] 1966-03-26, 52 y.o. female [id number]  Chief Complaint  Patient presents with  . Follow-up    chronic conditions  . Medication Refill    janumet, norco, preventil, bp meds, crestor    HPI:   Patient is a 52 y.o. female with past medical history significant for  DM2, HTN. HLP, GERD, chronic pain, PTSDwho presents today for followup   Patient on chronic opiates for DDD spine In sept had + UDS cocaine and diazepam - not appropriate At that time we had a conversation regarding her CSA  Repeat UDS in dec + cocaine again She does have h/o opiate withdrawal  Lab Results  Component Value Date   HGBA1C 7.6 (H) 09/11/2018   HGBA1C 8.8 (A) 05/14/2018   Lab Results  Component Value Date   LDLCALC 129 (H) 05/14/2018   CREATININE 0.96 09/11/2018    Fall Risk  10/01/2018 09/11/2018 08/12/2018 07/03/2018 05/14/2018  Falls in the past year? 0 0 0 No No     Depression screen [facility] 2/9 10/01/2018 08/12/2018 07/03/2018  Decreased Interest 0 0 0  Down, Depressed, Hopeless 0 0 0  PHQ - 2 Score 0 0 0    No Known Allergies  Prior to Admission medications   Medication Sig Start Date End Date Taking? Authorizing Provider  ACCU-CHEK AVIVA PLUS test strip USE AS DIRECTED FOR 30 DAYS 04/07/18  Yes [provider]  albuterol (PROVENTIL HFA;VENTOLIN HFA) 108 (90 Base) MCG/ACT inhaler Inhale 1-2 puffs into the lungs every 6 (six) hours as needed for wheezing or shortness of breath. 04/02/18  Yes Murray, Alyssa B, PA-C  Aspirin-Caffeine (BAYER BACK & BODY PAIN EX ST) 500-32.5 MG TABS Take 1-2 tablets by mouth every 4 (four) hours as needed (for pain).    Yes [provider]  baclofen (LIORESAL) 10 MG tablet Take 1 tablet (10 mg total) by mouth 3 (three) times daily as needed. 07/03/18  Yes Rutherford Guys, MD  Cholecalciferol (VITAMIN D-3) 1000 UNITS CAPS Take 1,000 Units by mouth daily.   Yes [provider]  doxycycline (VIBRA-TABS) 100 MG  tablet Take 1 tablet (100 mg total) by mouth 2 (two) times daily. 09/11/18  Yes Rutherford Guys, MD  HYDROcodone-acetaminophen [facility]) 10-325 MG tablet Take 1 tablet by mouth every 6 (six) hours as needed. 09/11/18  Yes Rutherford Guys, MD  JANUMET XR 50-500 MG TB24 Take 1 tablet by mouth 2 (two) times daily before a meal. 04/29/18  Yes [provider]  LANTUS SOLOSTAR 100 UNIT/ML Solostar Pen Inject 40 Units into the skin at bedtime. 05/14/18  Yes Rutherford Guys, MD  lisinopril-hydrochlorothiazide (PRINZIDE,ZESTORETIC) 20-12.5 MG tablet Take 1 tablet by mouth daily. 07/03/18  Yes Rutherford Guys, MD  magnesium oxide (MAG-OX) 400 (241.3 Mg) MG tablet Take 400 tablets by mouth 2 (two) times daily. 03/06/18  Yes [provider]  montelukast (SINGULAIR) 10 MG tablet Take 10 mg by mouth every evening. 03/06/18  Yes [provider]  omeprazole (PRILOSEC) 40 MG capsule Take 40 mg by mouth daily.   Yes [provider]  RELION PEN NEEDLE 31G/8MM 31G X 8 MM MISC daily. use as directed 04/07/18  Yes [provider]  rosuvastatin (CRESTOR) 10 MG tablet Take 10 mg by mouth daily.   Yes [provider]  sertraline (ZOLOFT) 25 MG tablet Take 1 tablet (25 mg total) by mouth daily. 07/03/18  Yes Rutherford Guys, MD    Past Medical History:  Diagnosis Date  . Asthma   . Diabetes mellitus without complication ([place])   . GERD (gastroesophageal reflux disease)   . Heart murmur    from childhood rheumatic fever, per patient  . Hyperlipidemia   . Pneumonia 12/14  . Wears partial dentures    top and bottom partials    Past Surgical History:  Procedure Laterality Date  . ANTERIOR LAT LUMBAR FUSION Left 07/31/2013   Procedure: ANTERIOR LATERAL LUMBAR FUSION 1 LEVEL/Left sided lumbar 3-4 lateral interbody fusion with instrumentation and allograft.;  Surgeon: Sinclair Ship, MD;  Location: [facility];  Service: Orthopedics;  Laterality: Left;  Left sided lumbar  3-4 lateral interbody fusion with instrumentation and allograft.  . APPENDECTOMY  30- 40 years  . carpal tunnel     right arm  . CARPAL TUNNEL RELEASE Right 12/15/2013   Procedure: RIGHT CARPAL TUNNEL RELEASE ENDOSCOPIC;  Surgeon: Jolyn Nap, MD;  Location: [facility];  Service: Orthopedics;  Laterality: Right;  . LATERAL EPICONDYLE RELEASE Right 12/15/2013   Procedure: RIGHT ULNAR NEUROPLASTY AT ELBOW ;  Surgeon: Jolyn Nap, MD;  Location: [facility];  Service: Orthopedics;  Laterality: Right;  . LUMBAR FUSION      Social History   Tobacco Use  . Smoking status: Former Smoker    Last attempt to quit: 08/30/2016    Years since quitting: 2.0  . Smokeless tobacco: Never Used  Substance Use Topics  . Alcohol use: No    Comment: beer everynow and then per patient    Family History  Problem Relation Age of Onset  . Diabetes Mother   . Heart disease Mother     ROS Per hpi  OBJECTIVE:  Blood pressure (!) 144/88, pulse 83, temperature 98.3 F (36.8 C), temperature source Oral, height 5\' 4"  (1.626 m), weight 160 lb 6.4 oz (72.8 kg), last menstrual period 03/07/2012, SpO2 96 %. Body mass index is 27.53 kg/m.   Physical Exam Vitals signs and nursing note reviewed.  Constitutional:      Appearance: She is well-developed.  HENT:     Head: Normocephalic and atraumatic.  Eyes:     General: No scleral icterus.    Conjunctiva/sclera: Conjunctivae normal.     Pupils: Pupils are equal, round, and reactive to light.  Neck:     Musculoskeletal: Neck supple.  Pulmonary:     Effort: Pulmonary effort is normal.  Skin:    General: Skin is warm and dry.  Neurological:     Mental Status: She is alert and oriented to person, place, and time.     ASSESSMENT and PLAN  1. Chronic midline low back pain without sciatica 2. Chronic prescription opiate use 3. Cocaine use Discussed again abnormal UDS, discussed that I would not continue to prescribe  opiates. Discussed ssx of withdrawals and management. Prescription for clonidine and vistaril given. Reviewed r/se/b. Patient will be traveling out of state and will not be able to followup in 2-3 days as advised. ER precautions given.  Other orders - baclofen (LIORESAL) 10 MG tablet; Take 1 tablet (10 mg total) by mouth 3 (three) times daily as needed. - JANUMET XR 50-500 MG TB24; Take 1 tablet by mouth 2 (two) times daily before a meal. - LANTUS SOLOSTAR 100 UNIT/ML Solostar Pen; Inject 40 Units into the skin at bedtime. - lisinopril-hydrochlorothiazide (PRINZIDE,ZESTORETIC) 20-12.5 MG tablet; Take 1 tablet by mouth daily. - montelukast (SINGULAIR) 10 MG tablet; Take 1 tablet (10 mg total) by mouth  every evening. - omeprazole (PRILOSEC) 40 MG capsule; Take 1 capsule (40 mg total) by mouth daily. - RELION PEN NEEDLE 31G/8MM 31G X 8 MM MISC; Inject 1 each into the skin daily. use as directed - rosuvastatin (CRESTOR) 10 MG tablet; Take 1 tablet (10 mg total) by mouth daily. - sertraline (ZOLOFT) 25 MG tablet; Take 1 tablet (25 mg total) by mouth daily. - cloNIDine (CATAPRES) 0.1 MG tablet; Take 1 tablet (0.1 mg total) by mouth 3 (three) times daily. - hydrOXYzine (ATARAX/VISTARIL) 25 MG tablet; Take 1 tablet (25 mg total) by mouth every 8 (eight) hours as needed for anxiety.  Return in about 2 weeks (around 10/15/2018).    Rutherford Guys, MD Primary Care at [facility] [place], [place] [address] Ph.  [phone] Fax [phone]

## 2018-10-01 NOTE — Patient Instructions (Addendum)
Clonidine 0.1mg  TID Vistaril 25mg  TID   If you have lab work done today you will be contacted with your lab results within the next 2 weeks.  If you have not heard from [place] then please contact us. The fastest way to get your results is to register for My Chart.   IF you received an x-ray today, you will receive an invoice from [facility]. Please contact [facility] at [phone] with questions or concerns regarding your invoice.   IF you received labwork today, you will receive an invoice from [place]. Please contact [facility] at [phone] with questions or concerns regarding your invoice.   Our billing staff will not be able to assist you with questions regarding bills from these companies.  You will be contacted with the lab results as soon as they are available. The fastest way to get your results is to activate your My Chart account. Instructions are located on the last page of this paperwork. If you have not heard from [place] regarding the results in 2 weeks, please contact this office.    Opiate  Opioid Withdrawal Treatment Opioid withdrawal is a group of symptoms that can happen if you have been taking opioids and then stop taking them. Opioid withdrawal can also happen:  When the dosage is decreased.  After stopping a prescription opioid as directed. Physical dependence can develop after taking opioids regularly for just a few days.  After taking a prescription opioid incorrectly, such as taking more than recommended or taking it for a different purpose (opioid misuse).  Taking the opioid illegally and then stopping. Opioid withdrawal is not usually life-threatening, but it can be very uncomfortable and severe. Opioid withdrawal treatment makes managing withdrawal easier. What are the signs and symptoms of opioid withdrawal? Symptoms of this condition can be both physical and emotional. Physical symptoms include:  Nausea and vomiting.  Muscle aches or  spasms.  Watery eyes and runny nose.  Widening of the dark centers of the eyes (dilated pupils).  Hair standing on end.  Fever and sweating.  Flushing and itching of the skin.  Stomach cramping and diarrhea.  Trouble sleeping. Emotional symptoms include:  Depression.  Anxiety and nervousness.  Restlessness, irritability, and severe mood swings. When symptoms start and how long they last depend on the kind of opioid you have been taking.  Short-acting opioids, such as heroin or oxycodone, work fast and then lose their effect quickly. Symptoms occur within hours of stopping or reducing the amount you take. The worst symptoms (peak withdrawal) occur in 2-3 days. Overall, symptoms should lessen in 7-10 days.  Long-acting opioids, such as methadone, work for a longer period of time. Symptoms can occur within 1-3 days of stopping or reducing the amount you take. The worst symptoms occur in 3-8 days. Symptoms may continue for several weeks but will be milder. There are also medicines that block the effects of opioids (opioid antagonists), such as naloxone. Naloxone is given to restore a person's breathing if it has slowed down or stopped due to an opioid overdose. The effect includes withdrawal symptoms that begin within minutes and last for about an hour. It is important to get medical help in this situation. How is this treated? Treatment for opioid withdrawal usually happens:  In an emergency department.  In a clinic or drug treatment facility.  At home.  In a combination of different settings. Treatment for this condition depends on the type of opioids that are causing your symptoms. Medicines  Opioid or opioid-like medicine. This is the most effective treatment. It is used to block withdrawal symptoms, and then the dose is lowered over time.  If you have been taking prescription opioid medicine, your health care provider may lower the dose over several days.  If you have  opioid use disorder, your health care provider may give you the following medicines to block withdrawals: ? Methadone. It is given at a certain dose. The dose is then slowly lowered over 6-10 days. ? Buprenorphine. It is given at a certain dose. The dose is then lowered over 3-5 days. In some cases, it may be lowered very slowly over 30 days.  Other medicines may also be used as a stand-alone treatment or in combination with other treatments. These include: ? Alpha-2 adrenergic agonists. These medicines block a brain chemical (norepinephrine) that causes some withdrawal symptoms. They can reduce and lessen the severity of symptoms but do not stop them. The medicines can cause side effects such as drowsiness, dizziness, and low blood pressure. ? Other medicines to reduce anxiety, relieve muscle aches, promote sleep, and decrease digestive symptoms like nausea and diarrhea. Other treatments   Counseling. This treatment is also called talk therapy. It is provided by treatment counselors and is used in addition to medicines.  Support groups. These groups provide emotional support, advice, and guidance during recovery. They offer a safe environment in which you can talk to present and past opioid users and those who have gone through treatment. Follow these instructions at home:  Always check with your health care provider before starting any new medicines. Many of the medicines used to treat opioid withdrawal can have dangerous side effects and should only be taken as prescribed by your health care provider.  Do not start using an opioid medicine again without talking to a health care provider. You may be at an increased risk for problems, including opioid overdose.  If you have chronic pain, ask your health care provider about an intensive pain rehabilitation program or a referral to a chronic pain specialist. You may need to work with a pain specialist to come up with a treatment plan to help manage  pain.  Keep all follow-up visits as told by your health care provider. This is important. Contact a health care provider if:  You need help stopping use of an opioid medicine.  You have been on long-term opioids and are planning to stop, and you would like to prepare for any withdrawal symptoms.  You have been treated at home, but you are still having symptoms of withdrawal.  You have been treated for withdrawal, but you have started using opioids again (relapsed). Get help right away if you:  Have chest pain and shortness of breath.  Are drowsy and have difficulty staying awake.  Feel weak or dizzy, or feel like you are going to pass out.  Have nausea or vomiting that does not go away, or you begin to vomit blood.  Have diarrhea that does not stop, or you begin to feel weak and light-headed.  Feel severely depressed and anxious.  Feel you may be a harm to yourself or others. These symptoms may represent a serious problem that is an emergency. Do not wait to see if the symptoms will go away. Get medical help right away. Call your local emergency services (911 in the [place] Do not drive yourself to the hospital. Summary  Opioid withdrawal is a group of symptoms that can happen if you have been  taking opioids and then stop taking them.  Opioid withdrawal symptoms are treated using medicines, counseling, and support groups.  The most effective treatment is to use an opioid or opioid-like drug to block withdrawal symptoms and gradually lower the dose over time. This information is not intended to replace advice given to you by your health care provider. Make sure you discuss any questions you have with your health care provider. Document Released: 01/10/2018 Document Revised: 01/10/2018 Document Reviewed: 01/10/2018 Elsevier Interactive Patient Education  2019 [employer].

## 2018-12-10 ENCOUNTER — Other Ambulatory Visit: Payer: Self-pay | Admitting: Family Medicine

## 2018-12-10 NOTE — Telephone Encounter (Signed)
Requested medication (s) are due for refill today:  Yes  Clonidine 0.1 mg  LR 10/01/18                                                                                   Yes  Hydroxyzine 25  Mg   LR 10/01/18  Requested medication (s) are on the active medication list:   Yes  Future visit scheduled:   No   Was for a 2-3 week follow up per Dr. Pamella Pert after starting the above medications.     Last ordered: 10/01/18 for both medications.  #30  0 refills for both.  Information sent to Dr. Pamella Pert for review and disposition.   Requested Prescriptions  Pending Prescriptions Disp Refills   cloNIDine (CATAPRES) 0.1 MG tablet [Pharmacy Med Name: cloNIDine HCl 0.1 MG Oral Tablet] 30 tablet 0    Sig: TAKE 1 TABLET BY MOUTH THREE TIMES DAILY     Cardiovascular:  Alpha-2 Agonists Failed - 12/10/2018 12:48 PM      Failed - Last BP in normal range    BP Readings from Last 1 Encounters:  10/01/18 (!) 144/88         Passed - Last Heart Rate in normal range    Pulse Readings from Last 1 Encounters:  10/01/18 83         Passed - Valid encounter within last 6 months    Recent Outpatient Visits          2 months ago Chronic midline low back pain without sciatica   [facility], Lilia Argue, MD   3 months ago Community acquired pneumonia of right middle lobe of lung [facility])   [facility], Lilia Argue, MD   4 months ago Chronic midline low back pain without sciatica   [facility], Lilia Argue, MD   5 months ago Need for prophylactic vaccination and inoculation against influenza   [facility], Lilia Argue, MD   7 months ago Uncontrolled type 2 diabetes mellitus with hyperglycemia [facility])   [facility], Lilia Argue, MD            hydrOXYzine (ATARAX/VISTARIL) 25 MG tablet [Pharmacy Med Name: hydrOXYzine HCl 25 MG Oral Tablet] 30 tablet 0    Sig: TAKE 1 TABLET BY MOUTH EVERY 8 HOURS AS NEEDED FOR ANXIETY     Ear, Nose, and  Throat:  Antihistamines Passed - 12/10/2018 12:48 PM      Passed - Valid encounter within last 12 months    Recent Outpatient Visits          2 months ago Chronic midline low back pain without sciatica   [facility], Lilia Argue, MD   3 months ago Community acquired pneumonia of right middle lobe of lung [facility])   [facility], Lilia Argue, MD   4 months ago Chronic midline low back pain without sciatica   [facility], Lilia Argue, MD   5 months ago Need for prophylactic vaccination and inoculation against influenza   [facility], Irma M,  MD   7 months ago Uncontrolled type 2 diabetes mellitus with hyperglycemia [facility])   [facility], Lilia Argue, MD

## 2018-12-11 NOTE — Telephone Encounter (Signed)
Please call patient and notify her that clonidine and hydroxyzine were not meant to be daily medications, they were meant to help with withdrawals from stopping her pain medication. Therefore, I am not going to refill them  thanks

## 2019-01-02 ENCOUNTER — Telehealth: Payer: Self-pay | Admitting: Family Medicine

## 2019-01-02 ENCOUNTER — Other Ambulatory Visit: Payer: Self-pay

## 2019-01-02 ENCOUNTER — Telehealth (INDEPENDENT_AMBULATORY_CARE_PROVIDER_SITE_OTHER): Payer: Medicare HMO | Admitting: Family Medicine

## 2019-01-02 DIAGNOSIS — I10 Essential (primary) hypertension: Secondary | ICD-10-CM

## 2019-01-02 DIAGNOSIS — M545 Low back pain: Secondary | ICD-10-CM

## 2019-01-02 DIAGNOSIS — G8929 Other chronic pain: Secondary | ICD-10-CM | POA: Diagnosis not present

## 2019-01-02 DIAGNOSIS — E1165 Type 2 diabetes mellitus with hyperglycemia: Secondary | ICD-10-CM

## 2019-01-02 DIAGNOSIS — Z794 Long term (current) use of insulin: Secondary | ICD-10-CM

## 2019-01-02 DIAGNOSIS — E785 Hyperlipidemia, unspecified: Secondary | ICD-10-CM

## 2019-01-02 DIAGNOSIS — F4310 Post-traumatic stress disorder, unspecified: Secondary | ICD-10-CM

## 2019-01-02 DIAGNOSIS — F1490 Cocaine use, unspecified, uncomplicated: Secondary | ICD-10-CM

## 2019-01-02 MED ORDER — CELECOXIB 100 MG PO CAPS: 100.0000 mg | ORAL_CAPSULE | Freq: Two times a day (BID) | ORAL | 4 refills | Status: DC

## 2019-01-02 MED ORDER — SERTRALINE HCL 25 MG PO TABS: 25.0000 mg | ORAL_TABLET | Freq: Every day | ORAL | 1 refills | Status: DC

## 2019-01-02 MED ORDER — LISINOPRIL-HYDROCHLOROTHIAZIDE 20-12.5 MG PO TABS: 1.0000 | ORAL_TABLET | Freq: Every day | ORAL | 1 refills | Status: DC

## 2019-01-02 MED ORDER — JANUMET XR 50-500 MG PO TB24: 1.0000 | ORAL_TABLET | Freq: Two times a day (BID) | ORAL | 3 refills | Status: DC

## 2019-01-02 MED ORDER — CLONIDINE HCL 0.1 MG PO TABS: 0.1000 mg | ORAL_TABLET | Freq: Three times a day (TID) | ORAL | 0 refills | Status: DC

## 2019-01-02 MED ORDER — HYDROXYZINE HCL 25 MG PO TABS: 25.0000 mg | ORAL_TABLET | Freq: Three times a day (TID) | ORAL | 0 refills | Status: DC | PRN

## 2019-01-02 MED ORDER — MONTELUKAST SODIUM 10 MG PO TABS: 10.0000 mg | ORAL_TABLET | Freq: Every evening | ORAL | 3 refills | Status: DC

## 2019-01-02 MED ORDER — LANTUS SOLOSTAR 100 UNIT/ML ~~LOC~~ SOPN: 40.0000 [IU] | PEN_INJECTOR | Freq: Every day | SUBCUTANEOUS | 4 refills | Status: DC

## 2019-01-02 MED ORDER — METHOCARBAMOL 500 MG PO TABS: 500.0000 mg | ORAL_TABLET | Freq: Four times a day (QID) | ORAL | 3 refills | Status: DC

## 2019-01-02 MED ORDER — ROSUVASTATIN CALCIUM 10 MG PO TABS: 10.0000 mg | ORAL_TABLET | Freq: Every day | ORAL | 3 refills | Status: DC

## 2019-01-02 MED ORDER — OMEPRAZOLE 40 MG PO CPDR: 40.0000 mg | DELAYED_RELEASE_CAPSULE | Freq: Every day | ORAL | 3 refills | Status: DC

## 2019-01-02 NOTE — Progress Notes (Signed)
Virtual Visit via telephone Note  I connected with patient on 01/02/19 at 1124 by telephone and verified that I am speaking with the correct person using two identifiers. [name] is currently located at home and patient is currently with her during visit. The provider, Rutherford Guys, MD is located in their office at time of visit.  I discussed the limitations, risks, security and privacy concerns of performing an evaluation and management service by telephone and the availability of in person appointments. I also discussed with the patient that there may be a patient responsible charge related to this service. The patient expressed understanding and agreed to proceed.  CC: wants to get back on pain meds  Telephone visit today to discuss possible restart of possible opiates  HPI  Last OV in dec 2019 Stopped rx for opiates after repeated UDS positive for cocaine +/- hydrocodone wo any metabolites Has not been checking cbgs,  Has gained 5 lbs since last OV Has been taking her diabetes, htn, hlp as prescribed Has back pain all the time, takes most of her day, very consuming   Lab Results  Component Value Date   HGBA1C 7.6 (H) 09/11/2018   HGBA1C 8.8 (A) 05/14/2018   Lab Results  Component Value Date   LDLCALC 129 (H) 05/14/2018   CREATININE 0.96 09/11/2018  ?  Fall Risk  01/02/2019 01/02/2019 10/01/2018 09/11/2018 08/12/2018  Falls in the past year? 0 0 0 0 0  Number falls in past yr: 0 0 - - -  Injury with Fall? 0 0 - - -     Depression screen [facility] 2/9 01/02/2019 01/02/2019 10/01/2018  Decreased Interest 0 0 0  Down, Depressed, Hopeless 0 0 0  PHQ - 2 Score 0 0 0    No Known Allergies  Prior to Admission medications   Medication Sig Start Date End Date Taking? Authorizing Provider  ACCU-CHEK AVIVA PLUS test strip USE AS DIRECTED FOR 30 DAYS 04/07/18  Yes [provider]  albuterol (PROVENTIL HFA;VENTOLIN HFA) 108 (90 Base) MCG/ACT inhaler Inhale 1-2 puffs  into the lungs every 6 (six) hours as needed for wheezing or shortness of breath. 04/02/18  Yes Murray, Alyssa B, PA-C  Aspirin-Caffeine (BAYER BACK & BODY PAIN EX ST) 500-32.5 MG TABS Take 1-2 tablets by mouth every 4 (four) hours as needed (for pain).    Yes [provider]  baclofen (LIORESAL) 10 MG tablet Take 1 tablet (10 mg total) by mouth 3 (three) times daily as needed. 10/01/18  Yes Rutherford Guys, MD  Cholecalciferol (VITAMIN D-3) 1000 UNITS CAPS Take 1,000 Units by mouth daily.   Yes [provider]  cloNIDine (CATAPRES) 0.1 MG tablet Take 1 tablet (0.1 mg total) by mouth 3 (three) times daily. 10/01/18  Yes Rutherford Guys, MD  hydrOXYzine (ATARAX/VISTARIL) 25 MG tablet Take 1 tablet (25 mg total) by mouth every 8 (eight) hours as needed for anxiety. 10/01/18  Yes Rutherford Guys, MD  JANUMET XR 50-500 MG TB24 Take 1 tablet by mouth 2 (two) times daily before a meal. 10/01/18  Yes Rutherford Guys, MD  LANTUS SOLOSTAR 100 UNIT/ML Solostar Pen Inject 40 Units into the skin at bedtime. 10/01/18  Yes Rutherford Guys, MD  lisinopril-hydrochlorothiazide (PRINZIDE,ZESTORETIC) 20-12.5 MG tablet Take 1 tablet by mouth daily. 10/01/18  Yes Rutherford Guys, MD  magnesium oxide (MAG-OX) 400 (241.3 Mg) MG tablet Take 400 tablets by mouth 2 (two) times daily. 03/06/18  Yes [provider]  montelukast (SINGULAIR) 10 MG tablet Take 1 tablet (10 mg total) by mouth every evening. 10/01/18  Yes Rutherford Guys, MD  omeprazole (PRILOSEC) 40 MG capsule Take 1 capsule (40 mg total) by mouth daily. 10/01/18  Yes Rutherford Guys, MD  RELION PEN NEEDLE 31G/8MM 31G X 8 MM MISC Inject 1 each into the skin daily. use as directed 10/01/18  Yes Rutherford Guys, MD  rosuvastatin (CRESTOR) 10 MG tablet Take 1 tablet (10 mg total) by mouth daily. 10/01/18  Yes Rutherford Guys, MD  sertraline (ZOLOFT) 25 MG tablet Take 1 tablet (25 mg total) by mouth daily. 10/01/18  Yes Rutherford Guys, MD    Past Medical History:  Diagnosis Date   Asthma    Diabetes mellitus without complication ([place])    GERD (gastroesophageal reflux disease)    Heart murmur    from childhood rheumatic fever, per patient   Hyperlipidemia    Pneumonia 12/14   Wears partial dentures    top and bottom partials    Past Surgical History:  Procedure Laterality Date   ANTERIOR LAT LUMBAR FUSION Left 07/31/2013   Procedure: ANTERIOR LATERAL LUMBAR FUSION 1 LEVEL/Left sided lumbar 3-4 lateral interbody fusion with instrumentation and allograft.;  Surgeon: Sinclair Ship, MD;  Location: [place];  Service: Orthopedics;  Laterality: Left;  Left sided lumbar 3-4 lateral interbody fusion with instrumentation and allograft.   APPENDECTOMY  30- 40 years   carpal tunnel     right arm   CARPAL TUNNEL RELEASE Right 12/15/2013   Procedure: RIGHT CARPAL TUNNEL RELEASE ENDOSCOPIC;  Surgeon: Jolyn Nap, MD;  Location: [place];  Service: Orthopedics;  Laterality: Right;   LATERAL EPICONDYLE RELEASE Right 12/15/2013   Procedure: RIGHT ULNAR NEUROPLASTY AT ELBOW ;  Surgeon: Jolyn Nap, MD;  Location: [place];  Service: Orthopedics;  Laterality: Right;   LUMBAR FUSION      Social History   Tobacco Use   Smoking status: Former Smoker    Last attempt to quit: 08/30/2016    Years since quitting: 2.3   Smokeless tobacco: Never Used  Substance Use Topics   Alcohol use: No    Comment: beer everynow and then per patient    Family History  Problem Relation Age of Onset   Diabetes Mother    Heart disease Mother     ROS  Per hpi  Objective  Vitals as reported by the patient: none  There were no vitals filed for this visit.  ASSESSMENT and PLAN  1. Chronic midline low back pain without sciatica Repeated inappropriate UDS suggestive for diversion as no metabolites present on QID rx for hydrocodone. Discussed would not resume opiate  medications at this time. Will do trial of celebrex and methocarbamol.   2. Uncontrolled type 2 diabetes mellitus with hyperglycemia (HCC) Last a1c improved. Checking again. Cont meds  3. Encounter for long-term (current) use of insulin ([place])  4. Essential hypertension, benign Refilled meds.  5. Hyperlipidemia, unspecified hyperlipidemia type Refilled meds  6. PTSD (post-traumatic stress disorder) Refilled meds  Other orders - celecoxib (CELEBREX) 100 MG capsule; Take 1 capsule (100 mg total) by mouth 2 (two) times daily. - methocarbamol (ROBAXIN) 500 MG tablet; Take 1 tablet (500 mg total) by mouth 4 (four) times daily. - sertraline (ZOLOFT) 25 MG tablet; Take 1 tablet (25 mg total) by mouth daily. - rosuvastatin (CRESTOR) 10 MG tablet; Take 1 tablet (10 mg total) by  mouth daily. - montelukast (SINGULAIR) 10 MG tablet; Take 1 tablet (10 mg total) by mouth every evening. - omeprazole (PRILOSEC) 40 MG capsule; Take 1 capsule (40 mg total) by mouth daily. - lisinopril-hydrochlorothiazide (PRINZIDE,ZESTORETIC) 20-12.5 MG tablet; Take 1 tablet by mouth daily. - LANTUS SOLOSTAR 100 UNIT/ML Solostar Pen; Inject 40 Units into the skin at bedtime. - JANUMET XR 50-500 MG TB24; Take 1 tablet by mouth 2 (two) times daily before a meal.   FOLLOW-UP: 3 months   The above assessment and management plan was discussed with the patient. The patient verbalized understanding of and has agreed to the management plan. Patient is aware to call the clinic if symptoms persist or worsen. Patient is aware when to return to the clinic for a follow-up visit. Patient educated on when it is appropriate to go to the emergency department.    I provided 26 minutes of non-face-to-face time during this encounter.  Rutherford Guys, MD Primary Care at [facility], [place] [address] Ph.  [phone] Fax [phone]

## 2019-01-02 NOTE — Telephone Encounter (Signed)
LVM.. Patients needs to make f/u appnt for June

## 2019-01-02 NOTE — Progress Notes (Signed)
Pt is needing refills on albuterol, hydroxyzine, lisinopril, zoloft, crestor and prilosec. Says she is having alot of pain and is needing to get bk on her pain pills. She says she will come in for blood work and urine to prove there is no drugs in her system. Lab has been pended and pt is making appt for nurse visit

## 2019-02-28 DIAGNOSIS — Z1231 Encounter for screening mammogram for malignant neoplasm of breast: Secondary | ICD-10-CM | POA: Diagnosis not present

## 2019-02-28 LAB — HM MAMMOGRAPHY

## 2019-03-07 DIAGNOSIS — E7800 Pure hypercholesterolemia, unspecified: Secondary | ICD-10-CM | POA: Diagnosis not present

## 2019-03-07 DIAGNOSIS — R350 Frequency of micturition: Secondary | ICD-10-CM | POA: Diagnosis not present

## 2019-03-07 DIAGNOSIS — Z79899 Other long term (current) drug therapy: Secondary | ICD-10-CM | POA: Diagnosis not present

## 2019-03-07 DIAGNOSIS — I10 Essential (primary) hypertension: Secondary | ICD-10-CM | POA: Diagnosis not present

## 2019-03-07 DIAGNOSIS — E119 Type 2 diabetes mellitus without complications: Secondary | ICD-10-CM | POA: Diagnosis not present

## 2019-03-07 DIAGNOSIS — Z794 Long term (current) use of insulin: Secondary | ICD-10-CM | POA: Diagnosis not present

## 2019-03-07 DIAGNOSIS — F17210 Nicotine dependence, cigarettes, uncomplicated: Secondary | ICD-10-CM | POA: Diagnosis not present

## 2019-03-07 DIAGNOSIS — M129 Arthropathy, unspecified: Secondary | ICD-10-CM | POA: Diagnosis not present

## 2019-03-14 DIAGNOSIS — Z79899 Other long term (current) drug therapy: Secondary | ICD-10-CM | POA: Diagnosis not present

## 2019-03-14 DIAGNOSIS — F1290 Cannabis use, unspecified, uncomplicated: Secondary | ICD-10-CM | POA: Diagnosis not present

## 2019-03-14 DIAGNOSIS — G8929 Other chronic pain: Secondary | ICD-10-CM | POA: Diagnosis not present

## 2019-03-14 DIAGNOSIS — F17210 Nicotine dependence, cigarettes, uncomplicated: Secondary | ICD-10-CM | POA: Diagnosis not present

## 2019-03-14 DIAGNOSIS — M545 Low back pain: Secondary | ICD-10-CM | POA: Diagnosis not present

## 2019-03-24 DIAGNOSIS — M545 Low back pain: Secondary | ICD-10-CM | POA: Diagnosis not present

## 2019-03-26 DIAGNOSIS — F329 Major depressive disorder, single episode, unspecified: Secondary | ICD-10-CM | POA: Diagnosis not present

## 2019-03-26 DIAGNOSIS — F4320 Adjustment disorder, unspecified: Secondary | ICD-10-CM | POA: Diagnosis not present

## 2019-03-28 DIAGNOSIS — Z79899 Other long term (current) drug therapy: Secondary | ICD-10-CM | POA: Diagnosis not present

## 2019-03-28 DIAGNOSIS — F1290 Cannabis use, unspecified, uncomplicated: Secondary | ICD-10-CM | POA: Diagnosis not present

## 2019-03-28 DIAGNOSIS — G8929 Other chronic pain: Secondary | ICD-10-CM | POA: Diagnosis not present

## 2019-03-28 DIAGNOSIS — F17210 Nicotine dependence, cigarettes, uncomplicated: Secondary | ICD-10-CM | POA: Diagnosis not present

## 2019-03-28 DIAGNOSIS — M545 Low back pain: Secondary | ICD-10-CM | POA: Diagnosis not present

## 2019-04-25 DIAGNOSIS — F17210 Nicotine dependence, cigarettes, uncomplicated: Secondary | ICD-10-CM | POA: Diagnosis not present

## 2019-04-25 DIAGNOSIS — G8929 Other chronic pain: Secondary | ICD-10-CM | POA: Diagnosis not present

## 2019-04-25 DIAGNOSIS — M47816 Spondylosis without myelopathy or radiculopathy, lumbar region: Secondary | ICD-10-CM | POA: Diagnosis not present

## 2019-04-25 DIAGNOSIS — Z79899 Other long term (current) drug therapy: Secondary | ICD-10-CM | POA: Diagnosis not present

## 2019-04-25 DIAGNOSIS — M545 Low back pain: Secondary | ICD-10-CM | POA: Diagnosis not present

## 2019-04-25 DIAGNOSIS — M9973 Connective tissue and disc stenosis of intervertebral foramina of lumbar region: Secondary | ICD-10-CM | POA: Diagnosis not present

## 2019-05-14 ENCOUNTER — Other Ambulatory Visit: Payer: Self-pay

## 2019-05-14 ENCOUNTER — Emergency Department (HOSPITAL_COMMUNITY)
Admission: EM | Admit: 2019-05-14 | Discharge: 2019-05-14 | Discharge status: Against Medical Advice | Payer: Medicare HMO | Attending: Emergency Medicine | Admitting: Emergency Medicine

## 2019-05-14 DIAGNOSIS — Z5321 Procedure and treatment not carried out due to patient leaving prior to being seen by health care provider: Secondary | ICD-10-CM | POA: Diagnosis not present

## 2019-05-14 DIAGNOSIS — R1031 Right lower quadrant pain: Secondary | ICD-10-CM | POA: Diagnosis present

## 2019-05-14 NOTE — ED Notes (Signed)
Pt not seen in lobby 

## 2019-05-14 NOTE — ED Notes (Signed)
Pt called for triage x1.  No response.  

## 2019-05-22 DIAGNOSIS — F329 Major depressive disorder, single episode, unspecified: Secondary | ICD-10-CM | POA: Diagnosis not present

## 2019-05-23 DIAGNOSIS — G8929 Other chronic pain: Secondary | ICD-10-CM | POA: Diagnosis not present

## 2019-05-23 DIAGNOSIS — M545 Low back pain: Secondary | ICD-10-CM | POA: Diagnosis not present

## 2019-05-23 DIAGNOSIS — Z79899 Other long term (current) drug therapy: Secondary | ICD-10-CM | POA: Diagnosis not present

## 2019-06-26 DIAGNOSIS — M9973 Connective tissue and disc stenosis of intervertebral foramina of lumbar region: Secondary | ICD-10-CM | POA: Diagnosis not present

## 2019-06-26 DIAGNOSIS — G8929 Other chronic pain: Secondary | ICD-10-CM | POA: Diagnosis not present

## 2019-06-26 DIAGNOSIS — Z79899 Other long term (current) drug therapy: Secondary | ICD-10-CM | POA: Diagnosis not present

## 2019-06-26 DIAGNOSIS — F17210 Nicotine dependence, cigarettes, uncomplicated: Secondary | ICD-10-CM | POA: Diagnosis not present

## 2019-06-26 DIAGNOSIS — M545 Low back pain: Secondary | ICD-10-CM | POA: Diagnosis not present

## 2019-06-26 DIAGNOSIS — M47816 Spondylosis without myelopathy or radiculopathy, lumbar region: Secondary | ICD-10-CM | POA: Diagnosis not present

## 2019-07-23 DIAGNOSIS — F329 Major depressive disorder, single episode, unspecified: Secondary | ICD-10-CM | POA: Diagnosis not present

## 2019-07-24 DIAGNOSIS — Z79899 Other long term (current) drug therapy: Secondary | ICD-10-CM | POA: Diagnosis not present

## 2019-07-24 DIAGNOSIS — M9973 Connective tissue and disc stenosis of intervertebral foramina of lumbar region: Secondary | ICD-10-CM | POA: Diagnosis not present

## 2019-07-24 DIAGNOSIS — Z23 Encounter for immunization: Secondary | ICD-10-CM | POA: Diagnosis not present

## 2019-07-24 DIAGNOSIS — M47816 Spondylosis without myelopathy or radiculopathy, lumbar region: Secondary | ICD-10-CM | POA: Diagnosis not present

## 2019-07-24 DIAGNOSIS — G8929 Other chronic pain: Secondary | ICD-10-CM | POA: Diagnosis not present

## 2019-07-24 DIAGNOSIS — M545 Low back pain: Secondary | ICD-10-CM | POA: Diagnosis not present

## 2019-07-24 DIAGNOSIS — F17210 Nicotine dependence, cigarettes, uncomplicated: Secondary | ICD-10-CM | POA: Diagnosis not present

## 2019-08-10 IMAGING — DX DG CHEST 2V
2 series · 2 of 2 positions shown · non-contrast
Comparison: None.

CLINICAL DATA: 52-year-old female with a history of cough

EXAM:
CHEST - 2 VIEW

[chest pa]
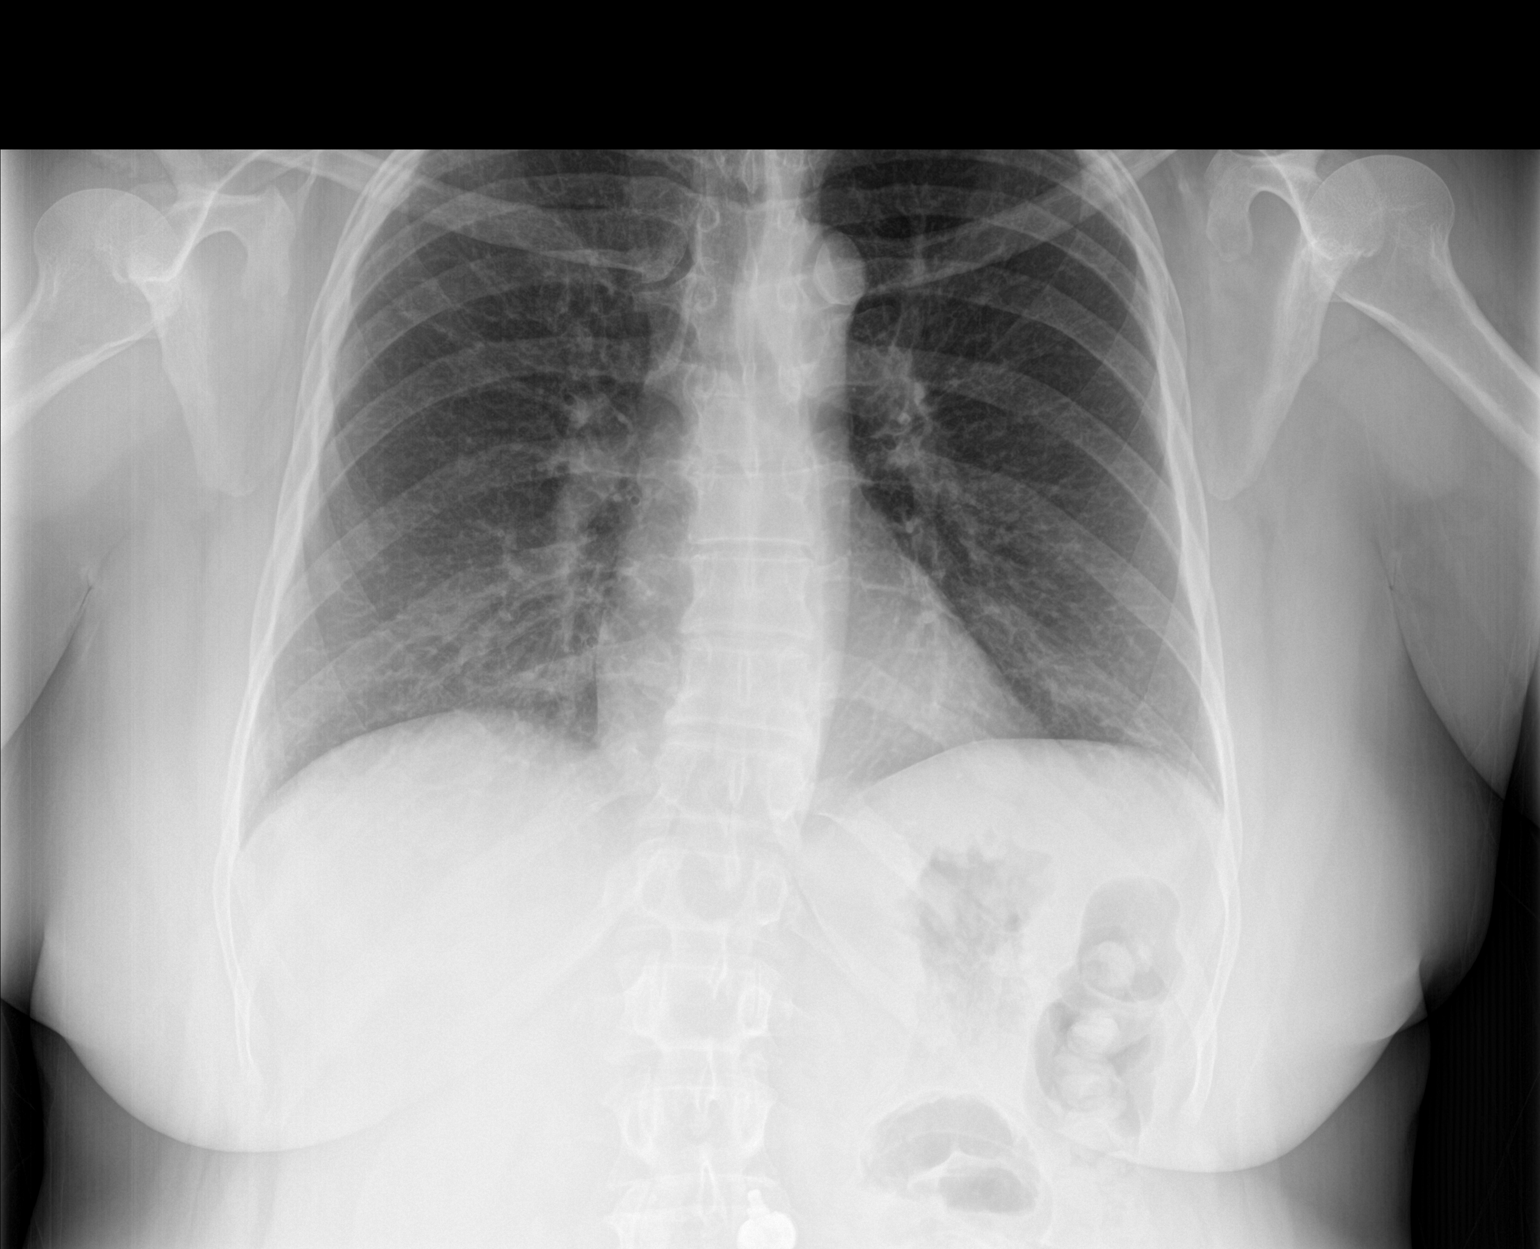

[chest lat]
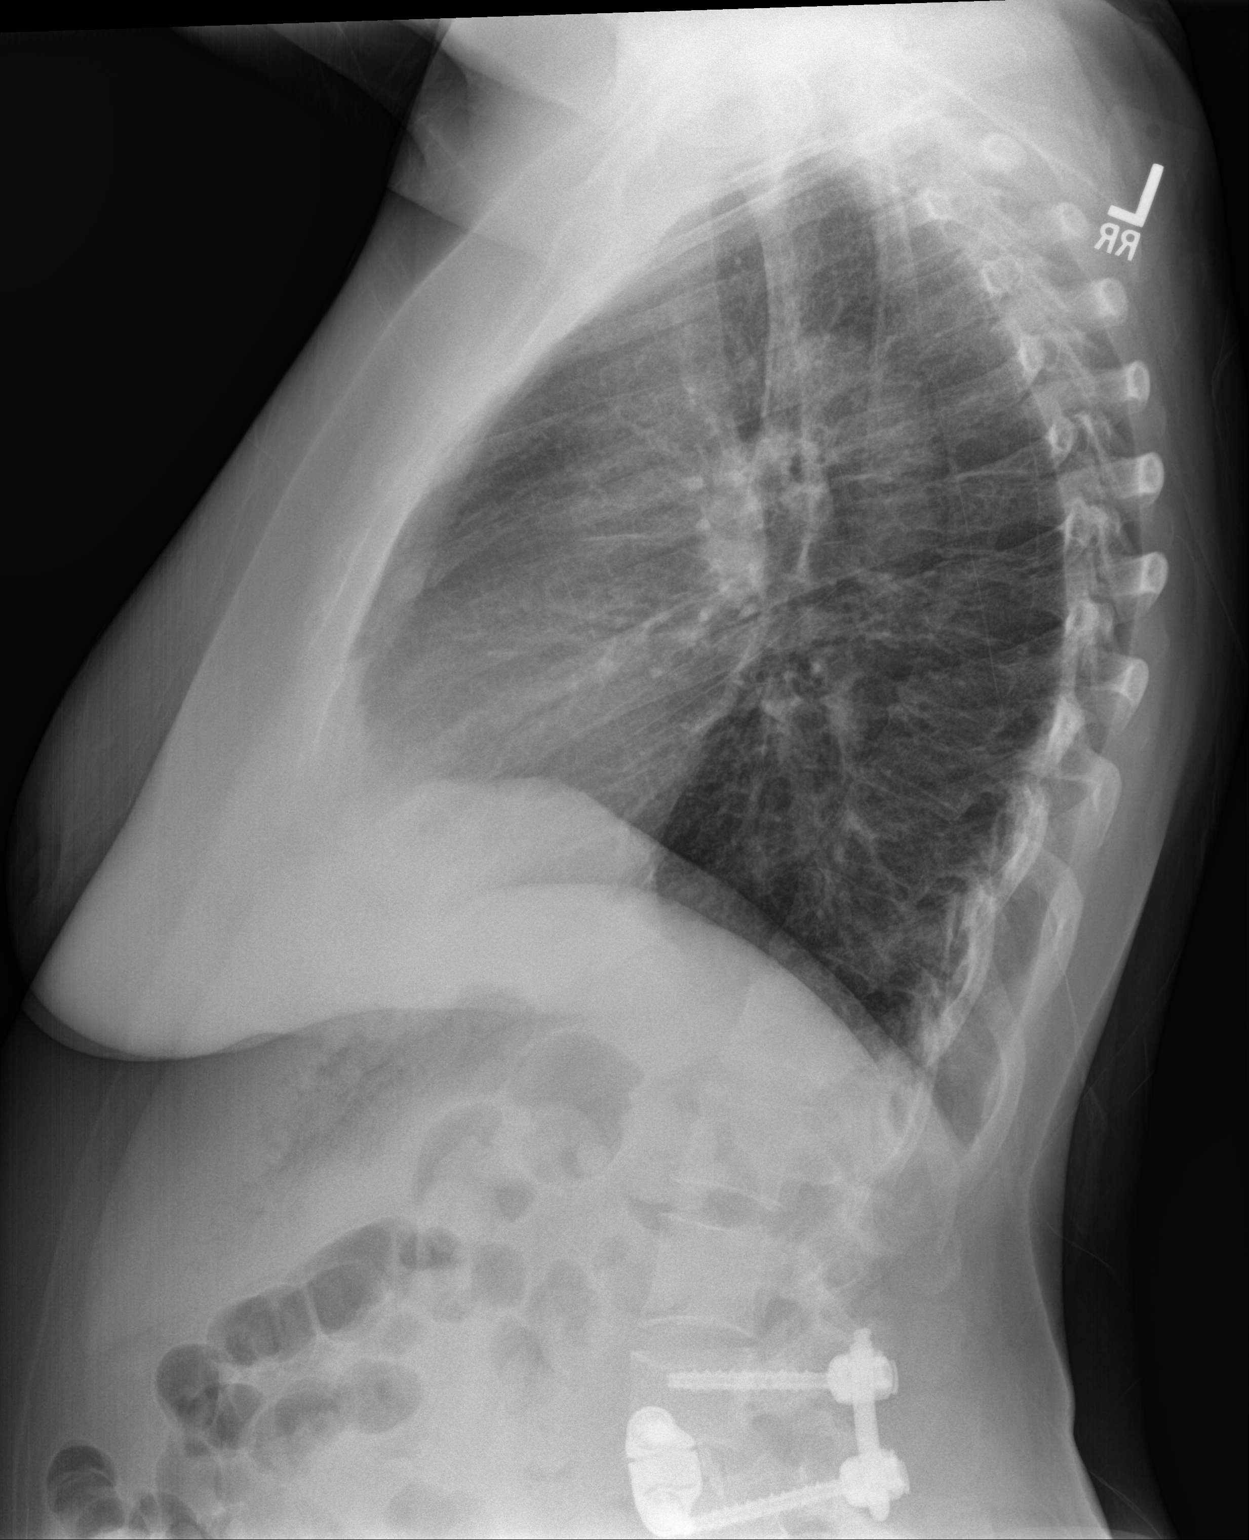

[2 of 2 positions shown; findings below may reference images not displayed]

FINDINGS: Cardiomediastinal silhouette within normal limits in size and
contour. No evidence of pneumothorax. No pleural effusion. No
confluent airspace disease. There is mild reticulonodular opacity,
particularly at the lower lungs and most pronounced on the right.

No displaced fracture
IMPRESSION: Pattern of reticulonodular opacity, most pronounced on the right,
may represent chronic changes in this patient with history of
smoking, or alternatively developing infection.

## 2019-08-26 ENCOUNTER — Telehealth: Payer: Self-pay

## 2019-08-26 NOTE — Telephone Encounter (Signed)
L/m for pt re overdue health maintenance items.  Would like to confirm if [place] still PCP as multiple providers in encounters.  If so, needs f/u appt.  If not, can update records.

## 2019-12-30 NOTE — Telephone Encounter (Signed)
No action needed

## 2020-01-08 ENCOUNTER — Ambulatory Visit: Payer: Medicare HMO

## 2020-01-09 ENCOUNTER — Ambulatory Visit: Payer: Medicare HMO | Attending: Internal Medicine

## 2020-01-09 DIAGNOSIS — Z23 Encounter for immunization: Secondary | ICD-10-CM

## 2020-01-09 NOTE — Progress Notes (Signed)
   Covid-19 Vaccination Clinic  Name:  Ariah Hernandezperez    MRN: [id number] DOB: 1965-11-18  01/09/2020  Ms. Adamec was observed post Covid-19 immunization for 15 minutes without incident. She was provided with Vaccine Information Sheet and instruction to access the V-Safe system.   Ms. Goostree was instructed to call 911 with any severe reactions post vaccine: Marland Kitchen Difficulty breathing  . Swelling of face and throat  . A fast heartbeat  . A bad rash all over body  . Dizziness and weakness   Immunizations Administered    Name Date Dose VIS Date Route   Pfizer COVID-19 Vaccine 01/09/2020  8:18 AM 0.3 mL 09/19/2019 Intramuscular   Manufacturer: [employer]   Lot: [id number]   [place]: [id number]

## 2020-02-02 ENCOUNTER — Ambulatory Visit: Payer: Medicare HMO | Attending: Internal Medicine

## 2020-02-02 DIAGNOSIS — Z23 Encounter for immunization: Secondary | ICD-10-CM

## 2020-02-02 NOTE — Progress Notes (Signed)
   Covid-19 Vaccination Clinic  Name:  [name]    MRN: [id number] DOB: [birth year]  02/02/2020  [name] was observed post Covid-19 immunization for 15 minutes without incident. She was provided with Vaccine Information Sheet and instruction to access the V-Safe system.   [name] was instructed to call 911 with any severe reactions post vaccine: Marland Kitchen Difficulty breathing  . Swelling of face and throat  . A fast heartbeat  . A bad rash all over body  . Dizziness and weakness   Immunizations Administered    Name Date Dose VIS Date Route   Pfizer COVID-19 Vaccine 02/02/2020 11:32 AM 0.3 mL 12/03/2018 Intramuscular   Manufacturer: [place]   Lot: [id number]   [place]: [id number]

## 2020-03-11 LAB — HM DIABETES EYE EXAM

## 2020-10-29 ENCOUNTER — Emergency Department (HOSPITAL_COMMUNITY)
Admission: EM | Admit: 2020-10-29 | Discharge: 2020-10-29 | Disposition: A | Discharge status: Home / Self Care | Payer: Medicare HMO | Attending: Emergency Medicine | Admitting: Emergency Medicine

## 2020-10-29 ENCOUNTER — Encounter (HOSPITAL_COMMUNITY): Payer: Self-pay

## 2020-10-29 ENCOUNTER — Other Ambulatory Visit: Payer: Self-pay

## 2020-10-29 ENCOUNTER — Emergency Department (HOSPITAL_COMMUNITY): Payer: Medicare HMO

## 2020-10-29 DIAGNOSIS — E1165 Type 2 diabetes mellitus with hyperglycemia: Secondary | ICD-10-CM | POA: Diagnosis not present

## 2020-10-29 DIAGNOSIS — Z794 Long term (current) use of insulin: Secondary | ICD-10-CM | POA: Insufficient documentation

## 2020-10-29 DIAGNOSIS — R1013 Epigastric pain: Secondary | ICD-10-CM

## 2020-10-29 DIAGNOSIS — R718 Other abnormality of red blood cells: Secondary | ICD-10-CM | POA: Diagnosis not present

## 2020-10-29 DIAGNOSIS — D259 Leiomyoma of uterus, unspecified: Secondary | ICD-10-CM | POA: Insufficient documentation

## 2020-10-29 DIAGNOSIS — R1011 Right upper quadrant pain: Secondary | ICD-10-CM | POA: Diagnosis present

## 2020-10-29 DIAGNOSIS — R748 Abnormal levels of other serum enzymes: Secondary | ICD-10-CM | POA: Insufficient documentation

## 2020-10-29 DIAGNOSIS — Z87891 Personal history of nicotine dependence: Secondary | ICD-10-CM | POA: Insufficient documentation

## 2020-10-29 DIAGNOSIS — R000 Tachycardia, unspecified: Secondary | ICD-10-CM | POA: Insufficient documentation

## 2020-10-29 DIAGNOSIS — Z79899 Other long term (current) drug therapy: Secondary | ICD-10-CM | POA: Insufficient documentation

## 2020-10-29 DIAGNOSIS — R110 Nausea: Secondary | ICD-10-CM | POA: Insufficient documentation

## 2020-10-29 DIAGNOSIS — I10 Essential (primary) hypertension: Secondary | ICD-10-CM | POA: Insufficient documentation

## 2020-10-29 DIAGNOSIS — J4520 Mild intermittent asthma, uncomplicated: Secondary | ICD-10-CM | POA: Insufficient documentation

## 2020-10-29 LAB — COMPREHENSIVE METABOLIC PANEL
ALT: 16 U/L (ref 0–44)
AST: 21 U/L (ref 15–41)
Albumin: 4.3 g/dL (ref 3.5–5.0)
Alkaline Phosphatase: 56 U/L (ref 38–126)
Anion gap: 14 (ref 5–15)
BUN: 10 mg/dL (ref 6–20)
CO2: 23 mmol/L (ref 22–32)
Calcium: 9.8 mg/dL (ref 8.9–10.3)
Chloride: 96 mmol/L — ABNORMAL LOW (ref 98–111)
Creatinine, Ser: 0.93 mg/dL (ref 0.44–1.00)
GFR, Estimated: >60 mL/min (ref >60)
Glucose, Bld: 185 mg/dL — ABNORMAL HIGH (ref 70–99)
Potassium: 4.8 mmol/L (ref 3.5–5.1)
Sodium: 133 mmol/L — ABNORMAL LOW (ref 135–145)
Total Bilirubin: 0.2 mg/dL — ABNORMAL LOW (ref 0.3–1.2)
Total Protein: 8.5 g/dL — ABNORMAL HIGH (ref 6.5–8.1)

## 2020-10-29 LAB — CBC
HCT: 50.7 % — ABNORMAL HIGH (ref 36.0–46.0)
Hemoglobin: 16.2 g/dL — ABNORMAL HIGH (ref 12.0–15.0)
MCH: 31 pg (ref 26.0–34.0)
MCHC: 32 g/dL (ref 30.0–36.0)
MCV: 96.9 fL (ref 80.0–100.0)
Platelets: 291 10*3/uL (ref 150–400)
RBC: 5.23 MIL/uL — ABNORMAL HIGH (ref 3.87–5.11)
RDW: 13.1 % (ref 11.5–15.5)
WBC: 12.9 10*3/uL — ABNORMAL HIGH (ref 4.0–10.5)
nRBC: 0 % (ref 0.0–0.2)

## 2020-10-29 LAB — I-STAT BETA HCG BLOOD, ED (MC, WL, AP ONLY): I-stat hCG, quantitative: <5 m[IU]/mL (ref <5)

## 2020-10-29 LAB — URINALYSIS, ROUTINE W REFLEX MICROSCOPIC
Bilirubin Urine: NEGATIVE
Glucose, UA: NEGATIVE mg/dL
Hgb urine dipstick: NEGATIVE
Ketones, ur: NEGATIVE mg/dL
Leukocytes,Ua: NEGATIVE
Nitrite: NEGATIVE
Protein, ur: NEGATIVE mg/dL
Specific Gravity, Urine: 1.005 (ref 1.005–1.030)
pH: 6 (ref 5.0–8.0)

## 2020-10-29 LAB — LIPASE, BLOOD: Lipase: 82 U/L — ABNORMAL HIGH (ref 11–51)

## 2020-10-29 MED ORDER — PANTOPRAZOLE SODIUM 40 MG IV SOLR
40.0000 mg | Freq: Once | INTRAVENOUS | Status: AC
  Administered 2020-10-29: 40 mg via INTRAVENOUS
  Filled 2020-10-29: qty 40

## 2020-10-29 MED ORDER — MORPHINE SULFATE (PF) 4 MG/ML IV SOLN
4.0000 mg | Freq: Once | INTRAVENOUS | Status: AC
  Administered 2020-10-29: 4 mg via INTRAVENOUS
  Filled 2020-10-29: qty 1

## 2020-10-29 MED ORDER — SODIUM CHLORIDE 0.9 % IV BOLUS
1000.0000 mL | Freq: Once | INTRAVENOUS | Status: AC
  Administered 2020-10-29: 1000 mL via INTRAVENOUS

## 2020-10-29 MED ORDER — IOHEXOL 300 MG/ML  SOLN
100.0000 mL | Freq: Once | INTRAMUSCULAR | Status: AC | PRN
  Administered 2020-10-29: 100 mL via INTRAVENOUS

## 2020-10-29 MED ORDER — PANTOPRAZOLE SODIUM 20 MG PO TBEC: 20.0000 mg | DELAYED_RELEASE_TABLET | Freq: Every day | ORAL | 1 refills | Status: DC

## 2020-10-29 NOTE — ED Provider Notes (Addendum)
[facility] DEPT Provider Note   CSN: [id number] Arrival date & time: 10/29/20  1504     History Chief Complaint  Patient presents with  . Abdominal Pain    [name] is a 55 y.o. female.  HPI Patient is a 55 year old female with a history of diabetes mellitus, GERD, chronic low back pain, who presents to the emergency department due to upper abdominal pain.  Has been ongoing for about 2 weeks.  No history of similar symptoms.  It is waxing and waning.  Worsens with palpation as well as when eating.  Patient denies any significant p.o. intake due to the worsening pain.  She saw her PCP yesterday and was told her white blood cell count was elevated and she should come to the emergency department for further evaluation.  Reports associated nausea without vomiting or diarrhea.  No hematochezia or melena.  No URI symptoms, chest pain, shortness of breath.  History of appendectomy. Patient smokes marijuana intermittently. No significant alcohol use or NSAID use. Pt states she takes percocet for chronic back pain.    Past Medical History:  Diagnosis Date  . Asthma   . Diabetes mellitus without complication ([place])   . GERD (gastroesophageal reflux disease)   . Heart murmur    from childhood rheumatic fever, per patient  . Hyperlipidemia   . Pneumonia 12/14  . Wears partial dentures    top and bottom partials    Patient Active Problem List   Diagnosis Date Noted  . Chronic midline low back pain without sciatica 05/15/2018  . Uncontrolled type 2 diabetes mellitus with hyperglycemia ([place]) 05/14/2018  . Encounter for long-term (current) use of insulin ([place]) 05/14/2018  . PTSD (post-traumatic stress disorder) 05/14/2018  . Essential hypertension, benign 05/14/2018  . Hyperlipidemia 05/14/2018  . Mild intermittent asthma 05/14/2018  . HCAP (healthcare-associated pneumonia) 10/05/2013  . Flu-like symptoms 10/05/2013  . Asthma exacerbation 10/05/2013  .  Diabetes mellitus ([place]) 10/05/2013  . GERD (gastroesophageal reflux disease) 10/05/2013  . Tobacco use 10/05/2013    Past Surgical History:  Procedure Laterality Date  . ANTERIOR LAT LUMBAR FUSION Left 07/31/2013   Procedure: ANTERIOR LATERAL LUMBAR FUSION 1 LEVEL/Left sided lumbar 3-4 lateral interbody fusion with instrumentation and allograft.;  Surgeon: Sinclair Ship, MD;  Location: [place] [facility];  Service: Orthopedics;  Laterality: Left;  Left sided lumbar 3-4 lateral interbody fusion with instrumentation and allograft.  . APPENDECTOMY  30- 40 years  . carpal tunnel     right arm  . CARPAL TUNNEL RELEASE Right 12/15/2013   Procedure: RIGHT CARPAL TUNNEL RELEASE ENDOSCOPIC;  Surgeon: Jolyn Nap, MD;  Location: [facility];  Service: Orthopedics;  Laterality: Right;  . LATERAL EPICONDYLE RELEASE Right 12/15/2013   Procedure: RIGHT ULNAR NEUROPLASTY AT ELBOW ;  Surgeon: Jolyn Nap, MD;  Location: [place];  Service: Orthopedics;  Laterality: Right;  . LUMBAR FUSION       OB History   No obstetric history on file.     Family History  Problem Relation Age of Onset  . Diabetes Mother   . Heart disease Mother     Social History   Tobacco Use  . Smoking status: Former Smoker    Quit date: 08/30/2016    Years since quitting: 4.1  . Smokeless tobacco: Never Used  Substance Use Topics  . Alcohol use: No    Comment: beer everynow and then per patient  . Drug use: No  Home Medications Prior to Admission medications   Medication Sig Start Date End Date Taking? Authorizing Provider  Ascorbic Acid (VITAMIN C) 1000 MG tablet Take 2,000 mg by mouth daily.   Yes [provider]  baclofen (LIORESAL) 10 MG tablet Take 10 mg by mouth 3 (three) times daily as needed for muscle spasms. 10/28/20  Yes [provider]  Cholecalciferol (VITAMIN D) 50 MCG (2000 UT) tablet Take 2,000 Units by mouth daily.   Yes [provider]   insulin glargine (LANTUS SOLOSTAR) 100 UNIT/ML Solostar Pen Inject 45 Units into the skin at bedtime.   Yes [provider]  JANUMET 50-1000 MG tablet Take 1 tablet by mouth 2 (two) times daily. 10/03/20  Yes [provider]  lisinopril-hydrochlorothiazide (ZESTORETIC) 20-25 MG tablet Take 1 tablet by mouth daily. 10/28/20  Yes [provider]  oxyCODONE-acetaminophen (PERCOCET) 10-325 MG tablet Take 1 tablet by mouth 5 (five) times daily. 10/28/20  Yes [provider]  pantoprazole (PROTONIX) 20 MG tablet Take 1 tablet (20 mg total) by mouth daily. 10/29/20  Yes Rayna Sexton, PA-C  rosuvastatin (CRESTOR) 40 MG tablet Take 40 mg by mouth every evening. 09/28/20  Yes [provider]  sertraline (ZOLOFT) 50 MG tablet Take 100 mg by mouth daily. 10/18/20  Yes [provider]  VASCEPA 1 g capsule Take 2 g by mouth 2 (two) times daily. 10/11/20  Yes [provider]  ACCU-CHEK AVIVA PLUS test strip USE AS DIRECTED FOR 30 DAYS 04/07/18   [provider]  albuterol (PROVENTIL HFA;VENTOLIN HFA) 108 (90 Base) MCG/ACT inhaler Inhale 1-2 puffs into the lungs every 6 (six) hours as needed for wheezing or shortness of breath. 04/02/18   Langston Masker B, PA-C  celecoxib (CELEBREX) 100 MG capsule Take 1 capsule (100 mg total) by mouth 2 (two) times daily. Patient not taking: No sig reported 01/02/19   Jacelyn Pi, Lilia Argue, MD  JANUMET XR 50-500 MG TB24 Take 1 tablet by mouth 2 (two) times daily before a meal. Patient not taking: No sig reported 01/02/19   Jacelyn Pi, Lilia Argue, MD  LANTUS SOLOSTAR 100 UNIT/ML Solostar Pen Inject 40 Units into the skin at bedtime. Patient not taking: No sig reported 01/02/19   Jacelyn Pi, Lilia Argue, MD  lisinopril-hydrochlorothiazide (PRINZIDE,ZESTORETIC) 20-12.5 MG tablet Take 1 tablet by mouth daily. Patient not taking: No sig reported 01/02/19   Jacelyn Pi, Lilia Argue, MD  methocarbamol (ROBAXIN) 500 MG tablet  Take 1 tablet (500 mg total) by mouth 4 (four) times daily. Patient not taking: No sig reported 01/02/19   Jacelyn Pi, Lilia Argue, MD  montelukast (SINGULAIR) 10 MG tablet Take 1 tablet (10 mg total) by mouth every evening. Patient not taking: No sig reported 01/02/19   Jacelyn Pi, Lilia Argue, MD  RELION PEN NEEDLE 31G/8MM 31G X 8 MM MISC Inject 1 each into the skin daily. use as directed 10/01/18   Jacelyn Pi, Lilia Argue, MD  rosuvastatin (CRESTOR) 10 MG tablet Take 1 tablet (10 mg total) by mouth daily. Patient not taking: No sig reported 01/02/19   Jacelyn Pi, Lilia Argue, MD  sertraline (ZOLOFT) 25 MG tablet Take 1 tablet (25 mg total) by mouth daily. Patient not taking: No sig reported 01/02/19   Jacelyn Pi, Lilia Argue, MD    Allergies    Patient has no known allergies.  Review of Systems   Review of Systems  All other systems reviewed and are negative. Ten systems reviewed and are  negative for acute change, except as noted in the HPI.   Physical Exam Updated Vital Signs BP 140/89   Pulse 79   Temp 98.3 F (36.8 C) (Oral)   Resp 20   LMP 03/07/2012   SpO2 94%   Physical Exam Vitals and nursing note reviewed.  Constitutional:      General: She is not in acute distress.    Appearance: Normal appearance. She is not ill-appearing, toxic-appearing or diaphoretic.  HENT:     Head: Normocephalic and atraumatic.     Right Ear: External ear normal.     Left Ear: External ear normal.     Nose: Nose normal.     Mouth/Throat:     Mouth: Mucous membranes are moist.     Pharynx: Oropharynx is clear. No oropharyngeal exudate or posterior oropharyngeal erythema.  Eyes:     Extraocular Movements: Extraocular movements intact.  Cardiovascular:     Rate and Rhythm: Regular rhythm. Tachycardia present.     Pulses: Normal pulses.     Heart sounds: Normal heart sounds. No murmur heard. No friction rub. No gallop.   Pulmonary:     Effort: Pulmonary effort is normal. No respiratory distress.      Breath sounds: Normal breath sounds. No stridor. No wheezing, rhonchi or rales.  Abdominal:     General: Abdomen is flat.     Palpations: Abdomen is soft.     Tenderness: There is abdominal tenderness in the right upper quadrant and epigastric area.     Comments: Abdomen is soft.  Moderate tenderness noted along the epigastrium and right upper quadrant.  Pain seems to be worst along the epigastrium.  Musculoskeletal:        General: Normal range of motion.     Cervical back: Normal range of motion and neck supple. No tenderness.  Skin:    General: Skin is warm and dry.  Neurological:     General: No focal deficit present.     Mental Status: She is alert and oriented to person, place, and time.  Psychiatric:        Mood and Affect: Mood normal.        Behavior: Behavior normal.    ED Results / Procedures / Treatments   Labs (all labs ordered are listed, but only abnormal results are displayed) Labs Reviewed  LIPASE, BLOOD - Abnormal; Notable for the following components:      Result Value   Lipase 82 (*)    All other components within normal limits  COMPREHENSIVE METABOLIC PANEL - Abnormal; Notable for the following components:   Sodium 133 (*)    Chloride 96 (*)    Glucose, Bld 185 (*)    Total Protein 8.5 (*)    Total Bilirubin 0.2 (*)    All other components within normal limits  CBC - Abnormal; Notable for the following components:   WBC 12.9 (*)    RBC 5.23 (*)    Hemoglobin 16.2 (*)    HCT 50.7 (*)    All other components within normal limits  URINALYSIS, ROUTINE W REFLEX MICROSCOPIC - Abnormal; Notable for the following components:   Color, Urine STRAW (*)    All other components within normal limits  I-STAT BETA HCG BLOOD, ED ([facility], [facility], [facility] ONLY)   EKG None  Radiology CT ABDOMEN PELVIS W CONTRAST  Result Date: 10/29/2020 CLINICAL DATA:  Epigastric pain for 2 weeks EXAM: CT ABDOMEN AND PELVIS WITH CONTRAST TECHNIQUE: Multidetector CT imaging of the  abdomen and  pelvis was performed using the standard protocol following bolus administration of intravenous contrast. CONTRAST:  137mL OMNIPAQUE IOHEXOL 300 MG/ML  SOLN COMPARISON:  12/15/2015 FINDINGS: Lower chest: No acute abnormality. Hepatobiliary: No focal liver abnormality is seen. No gallstones, gallbladder wall thickening, or biliary dilatation. Pancreas: Unremarkable. No pancreatic ductal dilatation or surrounding inflammatory changes. Spleen: Normal in size without focal abnormality. Adrenals/Urinary Tract: Adrenal glands are within normal limits. Kidneys demonstrate a normal enhancement pattern. No obstructive changes are seen. No renal or ureteral stones are noted. Bladder is decompressed. Stomach/Bowel: The appendix is not well visualized consistent with prior surgical history. No obstructive or inflammatory changes of the colon are seen. Small bowel and stomach are within normal limits. Vascular/Lymphatic: Aortic atherosclerosis. No enlarged abdominal or pelvic lymph nodes. Reproductive: Uterus is enlarged and lobular stable from the prior exam consistent with multiple uterine fibroids. No adnexal mass is noted. Other: No abdominal wall hernia or abnormality. No abdominopelvic ascites. Musculoskeletal: Postsurgical changes in the lumbar spine are noted stable from the prior exam. IMPRESSION: Uterine fibroids stable from the prior exam. No acute abnormality is noted. Electronically Signed   By: Inez Catalina M.D.   On: 10/29/2020 18:35    Procedures Procedures (including critical care time)  Medications Ordered in ED Medications  sodium chloride 0.9 % bolus 1,000 mL (0 mLs Intravenous Stopped 10/29/20 1830)  pantoprazole (PROTONIX) injection 40 mg (40 mg Intravenous Given 10/29/20 1631)  iohexol (OMNIPAQUE) 300 MG/ML solution 100 mL (100 mLs Intravenous Contrast Given 10/29/20 1802)  morphine 4 MG/ML injection 4 mg (4 mg Intravenous Given 10/29/20 1828)   ED Course  I have reviewed the triage vital signs  and the nursing notes.  Pertinent labs & imaging results that were available during my care of the patient were reviewed by me and considered in my medical decision making (see chart for details).  Clinical Course as of 10/29/20 1951  Fri Oct 29, 2020  1750 Lipase(!): 82 Chronically elevated. Appears to be similar to prior values.  [LJ]  1845 IMPRESSION: Uterine fibroids stable from the prior exam.  No acute abnormality is noted. [LJ]    Clinical Course User Index [LJ] Rayna Sexton, PA-C   MDM Rules/Calculators/A&P                          Pt is a 55 y.o. female who presents to the emergency department with 2 weeks of epigastric pain.  Labs: Leukocytosis of 12.9. Elevated RBC at 5.23 with an elevated hemoglobin at 16.2.  Likely hemoconcentration. CMP with a sodium of 133 as well as a chloride of 96.  Elevated glucose at 185.  Patient given 1 L of normal saline. Elevated lipase at 82.  Appears similar to prior values. No elevation in i-STAT beta-hCG. UA benign.  Imaging: CT obtained of the abdomen and pelvis.  Uterine fibroids which are stable from the prior exam were noted.  No other abnormalities were noted.  I, Rayna Sexton, PA-C, personally reviewed and evaluated these images and lab results as part of my medical decision-making.  Work-up today is reassuring.  Patient given IV fluids, Protonix, IV morphine.  She has Percocet at home for her chronic back pain.  Patient reports significant relief of her symptoms.  Will discharge on a course of Protonix.  We will give patient GI follow-up.  Discussed return precautions.  Her questions were answered and she was amicable at the time of discharge.  Note:  Portions of this report may have been transcribed using voice recognition software. Every effort was made to ensure accuracy; however, inadvertent computerized transcription errors may be present.   Final Clinical Impression(s) / ED Diagnoses Final diagnoses:  Epigastric  pain   Rx / DC Orders ED Discharge Orders         Ordered    pantoprazole (PROTONIX) 20 MG tablet  Daily        10/29/20 1947           Rayna Sexton, PA-C 10/29/20 1949    Rayna Sexton, PA-C 10/29/20 1951    Wyvonnia Dusky, MD 10/29/20 2217

## 2020-10-29 NOTE — Discharge Instructions (Signed)
Like we discussed, I am prescribing a medication called Protonix.  This will help decrease the amount of stomach acid.  You can take this once a day.  This will hopefully help with your abdominal pain.  I have also given you follow-up information below for [facility] gastroenterology.  Please give them a call to schedule a follow-up appointment for your stomach pain.  You can always return to the emergency department if your symptoms worsen.  It was a pleasure to meet you.

## 2020-10-29 NOTE — ED Triage Notes (Signed)
Pt reports RUQ abdominal pain and nausea x1 week. Pt was seen at PCP yesterday and was sent here due to elevated WBC.

## 2020-11-05 ENCOUNTER — Telehealth: Payer: Self-pay | Admitting: Hematology and Oncology

## 2020-11-05 NOTE — Telephone Encounter (Signed)
Received a new hem referral from [facility] for elevated white blood cell count. [name] has been cld and scheduled to see Dr. Chryl Heck on 2/3 at [place]. Pt aware to arrive 20 minutes early.

## 2020-11-11 ENCOUNTER — Inpatient Hospital Stay: Payer: Medicare HMO | Attending: Hematology and Oncology | Admitting: Hematology and Oncology

## 2020-11-11 ENCOUNTER — Inpatient Hospital Stay: Payer: Medicare HMO

## 2020-11-11 ENCOUNTER — Other Ambulatory Visit: Payer: Self-pay

## 2020-11-11 ENCOUNTER — Encounter: Payer: Self-pay | Admitting: Hematology and Oncology

## 2020-11-11 ENCOUNTER — Other Ambulatory Visit: Payer: Self-pay | Admitting: *Deleted

## 2020-11-11 VITALS — BP 116/71 | HR 68 | Temp 95.0°F | Resp 18 | Ht 64.0 in | Wt 157.4 lb

## 2020-11-11 DIAGNOSIS — D751 Secondary polycythemia: Secondary | ICD-10-CM

## 2020-11-11 DIAGNOSIS — Z794 Long term (current) use of insulin: Secondary | ICD-10-CM | POA: Diagnosis not present

## 2020-11-11 DIAGNOSIS — Z79899 Other long term (current) drug therapy: Secondary | ICD-10-CM | POA: Insufficient documentation

## 2020-11-11 DIAGNOSIS — D72829 Elevated white blood cell count, unspecified: Secondary | ICD-10-CM | POA: Diagnosis present

## 2020-11-11 DIAGNOSIS — Z87891 Personal history of nicotine dependence: Secondary | ICD-10-CM | POA: Diagnosis not present

## 2020-11-11 DIAGNOSIS — E785 Hyperlipidemia, unspecified: Secondary | ICD-10-CM | POA: Insufficient documentation

## 2020-11-11 DIAGNOSIS — J45909 Unspecified asthma, uncomplicated: Secondary | ICD-10-CM | POA: Diagnosis not present

## 2020-11-11 DIAGNOSIS — Z9049 Acquired absence of other specified parts of digestive tract: Secondary | ICD-10-CM | POA: Insufficient documentation

## 2020-11-11 DIAGNOSIS — E119 Type 2 diabetes mellitus without complications: Secondary | ICD-10-CM | POA: Insufficient documentation

## 2020-11-11 DIAGNOSIS — K219 Gastro-esophageal reflux disease without esophagitis: Secondary | ICD-10-CM | POA: Insufficient documentation

## 2020-11-11 DIAGNOSIS — D72820 Lymphocytosis (symptomatic): Secondary | ICD-10-CM

## 2020-11-11 DIAGNOSIS — Z8719 Personal history of other diseases of the digestive system: Secondary | ICD-10-CM | POA: Insufficient documentation

## 2020-11-11 LAB — CBC WITH DIFFERENTIAL/PLATELET
Abs Immature Granulocytes: 0.04 10*3/uL (ref 0.00–0.07)
Basophils Absolute: 0.1 10*3/uL (ref 0.0–0.1)
Basophils Relative: 1 %
Eosinophils Absolute: 0.2 10*3/uL (ref 0.0–0.5)
Eosinophils Relative: 1 %
HCT: 45.9 % (ref 36.0–46.0)
Hemoglobin: 15.3 g/dL — ABNORMAL HIGH (ref 12.0–15.0)
Immature Granulocytes: 0 %
Lymphocytes Relative: 36 %
Lymphs Abs: 4.6 10*3/uL — ABNORMAL HIGH (ref 0.7–4.0)
MCH: 30.8 pg (ref 26.0–34.0)
MCHC: 33.3 g/dL (ref 30.0–36.0)
MCV: 92.4 fL (ref 80.0–100.0)
Monocytes Absolute: 0.9 10*3/uL (ref 0.1–1.0)
Monocytes Relative: 7 %
Neutro Abs: 7 10*3/uL (ref 1.7–7.7)
Neutrophils Relative %: 55 %
Platelets: 300 10*3/uL (ref 150–400)
RBC: 4.97 MIL/uL (ref 3.87–5.11)
RDW: 12.6 % (ref 11.5–15.5)
WBC: 12.8 10*3/uL — ABNORMAL HIGH (ref 4.0–10.5)
nRBC: 0 % (ref 0.0–0.2)

## 2020-11-11 LAB — CMP (CANCER CENTER ONLY)
ALT: 14 U/L (ref 0–44)
AST: 13 U/L — ABNORMAL LOW (ref 15–41)
Albumin: 3.9 g/dL (ref 3.5–5.0)
Alkaline Phosphatase: 65 U/L (ref 38–126)
Anion gap: 8 (ref 5–15)
BUN: 6 mg/dL (ref 6–20)
CO2: 28 mmol/L (ref 22–32)
Calcium: 10.4 mg/dL — ABNORMAL HIGH (ref 8.9–10.3)
Chloride: 99 mmol/L (ref 98–111)
Creatinine: 0.99 mg/dL (ref 0.44–1.00)
GFR, Estimated: >60 mL/min (ref >60)
Glucose, Bld: 288 mg/dL — ABNORMAL HIGH (ref 70–99)
Potassium: 4.8 mmol/L (ref 3.5–5.1)
Sodium: 135 mmol/L (ref 135–145)
Total Bilirubin: 0.2 mg/dL — ABNORMAL LOW (ref 0.3–1.2)
Total Protein: 8.4 g/dL — ABNORMAL HIGH (ref 6.5–8.1)

## 2020-11-11 LAB — PATHOLOGIST SMEAR REVIEW

## 2020-11-11 LAB — LACTATE DEHYDROGENASE: LDH: 163 U/L (ref 98–192)

## 2020-11-11 NOTE — Progress Notes (Signed)
[facility] NOTE  Patient Care Team: [facility], [place] as PCP - General  CHIEF COMPLAINTS/PURPOSE OF CONSULTATION:  Leukocytosis.  ASSESSMENT & PLAN:  No problem-specific Assessment & Plan notes found for this encounter.  This is a very pleasant 55 year old female patient with past medical history significant for type 2 diabetes mellitus, asthma and dyslipidemia referred to hematology for evaluation of leukocytosis.  Patient has had some ongoing gastrointestinal issues, recently had pancreatitis, has lost about 15 pounds of weight in the past month which she attributes to pancreatitis and does not have much appetite.  She denies any fevers or drenching night sweats. Physical examination today without any palpable lymphadenopathy or hepatosplenomegaly.  I have reassured Ms. Mapel that she has had leukocytosis for the past several years with occasional lymphocytosis.  She was also found to have some polycythemia and the most recent labs.  At this time I believe her leukocytosis is likely reactive but given the associated lymphocytosis I think it is reasonable to do a CBC, smear review and flow cytometry.  With regards to polycythemia, again likely this is secondary associated with smoking and underlying lung disease but given the concomitant leukocytosis, I think it is reasonable to proceed with myeloproliferative work-up. She is in agreement with this.  Age-appropriate cancer screening recommended.  Plan  Myeloproliferative work-up today  Return to clinic in a few weeks to discuss labs and to discuss any additional recommendations.  Orders Placed This Encounter  Procedures  . CBC with Differential/Platelet    Standing Status:   Standing    Number of Occurrences:   22    Standing Expiration Date:   11/11/2021  . CMP ([facility] only)    Standing Status:   Future    Number of Occurrences:   1    Standing Expiration Date:   11/11/2021  . Lactate dehydrogenase     Standing Status:   Future    Number of Occurrences:   1    Standing Expiration Date:   11/11/2021  . Pathologist smear review    Standing Status:   Future    Number of Occurrences:   1    Standing Expiration Date:   11/11/2021  . Flow Cytometry, Peripheral Blood (Oncology)    Standing Status:   Future    Number of Occurrences:   1    Standing Expiration Date:   11/11/2021  . JAK2 (INCLUDING V617F AND EXON 12), MPL,& CALR W/RFL MPN PANEL ([facility])  . BCR-ABL    With RT-PCR technique    Standing Status:   Standing    Number of Occurrences:   22    Standing Expiration Date:   11/11/2021  . BCR ABL1 FISH ([facility])    Standing Status:   Future    Standing Expiration Date:   11/11/2021     HISTORY OF PRESENTING ILLNESS:   Sharen Hint 55 y.o. female is here because of persistent leukocytosis.  This is a very pleasant 55 year old female patient with past medical history significant for asthma, type 2 diabetes mellitus, dyslipidemia referred to hematology for evaluation of leukocytosis.  Patient arrived to the appointment with her husband.  She was extremely anxious about coming to the cancer center.  She was in tears and was worried about possibility of cancer.  She denies any fevers, drenching night sweats.  She does have some appetite issues have lost about 15 pounds of weight which was attribute it to some pancreatitis and some ongoing gastrointestinal issues  for which she is following up with gastroenterology.  She has an endoscopy and a colonoscopy, colonoscopy revealed some benign polyps, endoscopy pending.  She otherwise denies any recurrent infections or hospitalizations.  She has some underlying asthma, smoker.  No shortness of breath on exertion.  No hematochezia or melena.  No change in urinary habits. She sleeps okay but does report a lot of stress.  Rest of the pertinent review of systems as mentioned below.  REVIEW OF SYSTEMS:   Constitutional: Denies fevers, chills or abnormal night sweats  she has some baseline night sweats from menopause but not drenching. Eyes: Denies blurriness of vision, double vision or watery eyes Ears, nose, mouth, throat, and face: Denies mucositis or sore throat Respiratory: Denies cough, dyspnea or wheezes Cardiovascular: Denies chest discomfort or lower extremity swelling Gastrointestinal:  Denies nausea, heartburn or change in bowel habits Skin: Denies abnormal skin rashes Lymphatics: Denies new lymphadenopathy or easy bruising Neurological:Denies numbness, tingling or new weaknesses Behavioral/Psych: anxious All other systems were reviewed with the patient and are negative.  MEDICAL HISTORY:  Past Medical History:  Diagnosis Date  . Asthma   . Diabetes mellitus without complication ([place])   . GERD (gastroesophageal reflux disease)   . Heart murmur    from childhood rheumatic fever, per patient  . Hyperlipidemia   . Pneumonia 12/14  . Wears partial dentures    top and bottom partials    SURGICAL HISTORY: Past Surgical History:  Procedure Laterality Date  . ANTERIOR LAT LUMBAR FUSION Left 07/31/2013   Procedure: ANTERIOR LATERAL LUMBAR FUSION 1 LEVEL/Left sided lumbar 3-4 lateral interbody fusion with instrumentation and allograft.;  Surgeon: Sinclair Ship, MD;  Location: [facility];  Service: Orthopedics;  Laterality: Left;  Left sided lumbar 3-4 lateral interbody fusion with instrumentation and allograft.  . APPENDECTOMY  30- 40 years  . carpal tunnel     right arm  . CARPAL TUNNEL RELEASE Right 12/15/2013   Procedure: RIGHT CARPAL TUNNEL RELEASE ENDOSCOPIC;  Surgeon: Jolyn Nap, MD;  Location: [facility];  Service: Orthopedics;  Laterality: Right;  . LATERAL EPICONDYLE RELEASE Right 12/15/2013   Procedure: RIGHT ULNAR NEUROPLASTY AT ELBOW ;  Surgeon: Jolyn Nap, MD;  Location: [facility];  Service: Orthopedics;  Laterality: Right;  . LUMBAR FUSION      SOCIAL HISTORY: Social History    Socioeconomic History  . Marital status: Married    Spouse name: Not on file  . Number of children: Not on file  . Years of education: Not on file  . Highest education level: Not on file  Occupational History  . Not on file  Tobacco Use  . Smoking status: Former Smoker    Quit date: 08/30/2016    Years since quitting: 4.2  . Smokeless tobacco: Never Used  Substance and Sexual Activity  . Alcohol use: No    Comment: beer everynow and then per patient  . Drug use: No  . Sexual activity: Not Currently    Comment: says she quit 12/14  Other Topics Concern  . Not on file  Social History Narrative   ** Merged History Encounter **       Social Determinants of Health   Financial Resource Strain: Not on file  Food Insecurity: Not on file  Transportation Needs: Not on file  Physical Activity: Not on file  Stress: Not on file  Social Connections: Not on file  Intimate Partner Violence: Not on file  FAMILY HISTORY: Family History  Problem Relation Age of Onset  . Diabetes Mother   . Heart disease Mother     ALLERGIES:  has No Known Allergies.  MEDICATIONS:  Current Outpatient Medications  Medication Sig Dispense Refill  . ACCU-CHEK AVIVA PLUS test strip USE AS DIRECTED FOR 30 DAYS  6  . albuterol (PROVENTIL HFA;VENTOLIN HFA) 108 (90 Base) MCG/ACT inhaler Inhale 1-2 puffs into the lungs every 6 (six) hours as needed for wheezing or shortness of breath. 1 Inhaler 0  . baclofen (LIORESAL) 10 MG tablet Take 10 mg by mouth 3 (three) times daily as needed for muscle spasms.    . Cholecalciferol (VITAMIN D) 50 MCG (2000 UT) tablet Take 2,000 Units by mouth daily.    Marland Kitchen JANUMET 50-1000 MG tablet Take 1 tablet by mouth 2 (two) times daily.    Marland Kitchen LANTUS SOLOSTAR 100 UNIT/ML Solostar Pen Inject 40 Units into the skin at bedtime. (Patient taking differently: Inject 45 Units into the skin at bedtime.) 15 mL 4  . lisinopril-hydrochlorothiazide (PRINZIDE,ZESTORETIC) 20-12.5 MG tablet  Take 1 tablet by mouth daily. 90 tablet 1  . methocarbamol (ROBAXIN) 500 MG tablet Take 1 tablet (500 mg total) by mouth 4 (four) times daily. 120 tablet 3  . montelukast (SINGULAIR) 10 MG tablet Take 1 tablet (10 mg total) by mouth every evening. 90 tablet 3  . oxyCODONE-acetaminophen (PERCOCET) 10-325 MG tablet Take 1 tablet by mouth 5 (five) times daily.    . pantoprazole (PROTONIX) 20 MG tablet Take 1 tablet (20 mg total) by mouth daily. 30 tablet 1  . RELION PEN NEEDLE 31G/8MM 31G X 8 MM MISC Inject 1 each into the skin daily. use as directed 100 each 2  . rosuvastatin (CRESTOR) 10 MG tablet Take 1 tablet (10 mg total) by mouth daily. 90 tablet 3  . rosuvastatin (CRESTOR) 40 MG tablet Take 40 mg by mouth every evening.    . sertraline (ZOLOFT) 25 MG tablet Take 1 tablet (25 mg total) by mouth daily. 90 tablet 1  . VASCEPA 1 g capsule Take 2 g by mouth 2 (two) times daily.     No current facility-administered medications for this visit.     PHYSICAL EXAMINATION:  ECOG PERFORMANCE STATUS: 0 - Asymptomatic  Vitals:   11/11/20 1024  BP: 116/71  Pulse: 68  Resp: 18  Temp: (!) 95 F (35 C)  SpO2: 98%   Filed Weights   11/11/20 1024  Weight: 157 lb 6.4 oz (71.4 kg)    GENERAL:alert, no distress and comfortable SKIN: skin color, texture, turgor are normal, no rashes or significant lesions EYES: normal, conjunctiva are pink and non-injected, sclera clear OROPHARYNX:no exudate, no erythema and lips, buccal mucosa, and tongue normal  NECK: supple, thyroid normal size, non-tender, without nodularity LYMPH:  no palpable lymphadenopathy in the cervical, axillary or inguinal LUNGS: clear to auscultation and percussion with normal breathing effort HEART: regular rate & rhythm and no murmurs and no lower extremity edema ABDOMEN:abdomen soft, non-tender and normal bowel sounds Musculoskeletal:no cyanosis of digits and no clubbing  PSYCH: alert & oriented x 3 with fluent speech NEURO:  no focal motor/sensory deficits  LABORATORY DATA:  I have reviewed the data as listed Lab Results  Component Value Date   WBC 12.8 (H) 11/11/2020   HGB 15.3 (H) 11/11/2020   HCT 45.9 11/11/2020   MCV 92.4 11/11/2020   PLT 300 11/11/2020     Chemistry      Component Value Date/Time  NA 135 11/11/2020 1110   NA 139 09/11/2018 1557   K 4.8 11/11/2020 1110   CL 99 11/11/2020 1110   CO2 28 11/11/2020 1110   BUN 6 11/11/2020 1110   BUN 7 09/11/2018 1557   CREATININE 0.99 11/11/2020 1110      Component Value Date/Time   CALCIUM 10.4 (H) 11/11/2020 1110   ALKPHOS 65 11/11/2020 1110   AST 13 (L) 11/11/2020 1110   ALT 14 11/11/2020 1110   BILITOT 0.2 (L) 11/11/2020 1110       RADIOGRAPHIC STUDIES: I have personally reviewed the radiological images as listed and agreed with the findings in the report. CT ABDOMEN PELVIS W CONTRAST  Result Date: 10/29/2020 CLINICAL DATA:  Epigastric pain for 2 weeks EXAM: CT ABDOMEN AND PELVIS WITH CONTRAST TECHNIQUE: Multidetector CT imaging of the abdomen and pelvis was performed using the standard protocol following bolus administration of intravenous contrast. CONTRAST:  154mL OMNIPAQUE IOHEXOL 300 MG/ML  SOLN COMPARISON:  12/15/2015 FINDINGS: Lower chest: No acute abnormality. Hepatobiliary: No focal liver abnormality is seen. No gallstones, gallbladder wall thickening, or biliary dilatation. Pancreas: Unremarkable. No pancreatic ductal dilatation or surrounding inflammatory changes. Spleen: Normal in size without focal abnormality. Adrenals/Urinary Tract: Adrenal glands are within normal limits. Kidneys demonstrate a normal enhancement pattern. No obstructive changes are seen. No renal or ureteral stones are noted. Bladder is decompressed. Stomach/Bowel: The appendix is not well visualized consistent with prior surgical history. No obstructive or inflammatory changes of the colon are seen. Small bowel and stomach are within normal limits.  Vascular/Lymphatic: Aortic atherosclerosis. No enlarged abdominal or pelvic lymph nodes. Reproductive: Uterus is enlarged and lobular stable from the prior exam consistent with multiple uterine fibroids. No adnexal mass is noted. Other: No abdominal wall hernia or abnormality. No abdominopelvic ascites. Musculoskeletal: Postsurgical changes in the lumbar spine are noted stable from the prior exam. IMPRESSION: Uterine fibroids stable from the prior exam. No acute abnormality is noted. Electronically Signed   By: Inez Catalina M.D.   On: 10/29/2020 18:35    All questions were answered. The patient knows to call the clinic with any problems, questions or concerns. I spent 45 minutes in the care of this patient including H and P, review of records, counseling and coordination of care.     Benay Pike, MD 11/11/2020 2:38 PM

## 2020-11-12 LAB — SURGICAL PATHOLOGY

## 2020-11-15 LAB — FLOW CYTOMETRY

## 2020-11-18 LAB — BCR ABL1 FISH (GENPATH)

## 2020-11-18 LAB — JAK2 (INCLUDING V617F AND EXON 12), MPL,& CALR W/RFL MPN PANEL (NGS)

## 2020-12-07 ENCOUNTER — Ambulatory Visit: Payer: Medicare HMO | Admitting: Hematology and Oncology

## 2020-12-07 ENCOUNTER — Inpatient Hospital Stay: Payer: Medicare HMO | Admitting: Hematology and Oncology

## 2020-12-14 ENCOUNTER — Inpatient Hospital Stay: Payer: Medicare HMO | Attending: Hematology and Oncology | Admitting: Hematology and Oncology

## 2020-12-14 ENCOUNTER — Telehealth: Payer: Self-pay | Admitting: Hematology and Oncology

## 2020-12-14 ENCOUNTER — Other Ambulatory Visit: Payer: Self-pay

## 2020-12-14 VITALS — BP 117/65 | HR 81 | Temp 97.0°F | Resp 17 | Ht 64.0 in | Wt 158.4 lb

## 2020-12-14 DIAGNOSIS — D72820 Lymphocytosis (symptomatic): Secondary | ICD-10-CM | POA: Diagnosis not present

## 2020-12-14 DIAGNOSIS — D72829 Elevated white blood cell count, unspecified: Secondary | ICD-10-CM | POA: Diagnosis present

## 2020-12-14 DIAGNOSIS — K219 Gastro-esophageal reflux disease without esophagitis: Secondary | ICD-10-CM | POA: Diagnosis not present

## 2020-12-14 DIAGNOSIS — E119 Type 2 diabetes mellitus without complications: Secondary | ICD-10-CM | POA: Diagnosis not present

## 2020-12-14 DIAGNOSIS — D751 Secondary polycythemia: Secondary | ICD-10-CM

## 2020-12-14 DIAGNOSIS — Z87891 Personal history of nicotine dependence: Secondary | ICD-10-CM | POA: Insufficient documentation

## 2020-12-14 DIAGNOSIS — E785 Hyperlipidemia, unspecified: Secondary | ICD-10-CM | POA: Insufficient documentation

## 2020-12-14 DIAGNOSIS — Z794 Long term (current) use of insulin: Secondary | ICD-10-CM | POA: Insufficient documentation

## 2020-12-14 DIAGNOSIS — J45909 Unspecified asthma, uncomplicated: Secondary | ICD-10-CM | POA: Diagnosis not present

## 2020-12-14 DIAGNOSIS — R634 Abnormal weight loss: Secondary | ICD-10-CM | POA: Insufficient documentation

## 2020-12-14 DIAGNOSIS — Z8719 Personal history of other diseases of the digestive system: Secondary | ICD-10-CM | POA: Diagnosis not present

## 2020-12-14 DIAGNOSIS — Z79899 Other long term (current) drug therapy: Secondary | ICD-10-CM | POA: Diagnosis not present

## 2020-12-14 NOTE — Progress Notes (Signed)
[facility] NOTE  Patient Care Team: [facility], [facility] as PCP - General  CHIEF COMPLAINTS/PURPOSE OF CONSULTATION:  Leukocytosis.  ASSESSMENT & PLAN:   No problem-specific Assessment & Plan notes found for this encounter.  This is a very pleasant 55 year old female patient with past medical history significant for type 2 diabetes mellitus, asthma and dyslipidemia referred to hematology for evaluation of leukocytosis.   During our last visit, I recommended that she consider MPN work up and flow cytometry given lymphocytosis Flow unremarkable MPN work up negative. I have reviewed pertinent records and it is likely that she has reactive leukocytosis. At this time since clinically she is feeling better too, I recommend surveillance. She is agreeable to this plan.  Plan  RTC in 3/4 months.  No orders of the defined types were placed in this encounter.    HISTORY OF PRESENTING ILLNESS:   [name] 55 y.o. female is here because of persistent leukocytosis.  This is a very pleasant 55 year old female patient with past medical history significant for asthma, type 2 diabetes mellitus, dyslipidemia referred to hematology for evaluation of leukocytosis.  Patient arrived to the appointment with her husband.  She was extremely anxious about coming to the cancer center.  She was in tears and was worried about possibility of cancer.  She denies any fevers, drenching night sweats.  She does have some appetite issues have lost about 15 pounds of weight which was attribute it to some pancreatitis and some ongoing gastrointestinal issues for which she is following up with gastroenterology.  She has an endoscopy and a colonoscopy, colonoscopy revealed some benign polyps, endoscopy pending.  She otherwise denies any recurrent infections or hospitalizations.  She has some underlying asthma, smoker.  No shortness of breath on exertion.  No hematochezia or melena.  No change  in urinary habits. She sleeps okay but does report a lot of stress.  Rest of the pertinent review of systems as mentioned below.  Interval History  Since last visit, patient has been doing really well.  No more episodes of abdominal pain, change in bowel habits or urinary habits.  Appetite is improving.  Weight has been improving as well.  She is extremely happy with how she is feeling at this time.  No interim infections, hospitalizations or new medications.  Rest of the pertinent 10 point ROS as mentioned below are unremarkable.  REVIEW OF SYSTEMS:   Constitutional: Denies fevers, chills or abnormal night sweats she has some baseline night sweats from menopause but not drenching. Eyes: Denies blurriness of vision, double vision or watery eyes Ears, nose, mouth, throat, and face: Denies mucositis or sore throat Respiratory: Denies cough, dyspnea or wheezes Cardiovascular: Denies chest discomfort or lower extremity swelling Gastrointestinal:  Denies nausea, heartburn or change in bowel habits Skin: Denies abnormal skin rashes Lymphatics: Denies new lymphadenopathy or easy bruising Neurological:Denies numbness, tingling or new weaknesses Behavioral/Psych: anxious All other systems were reviewed with the patient and are negative.  MEDICAL HISTORY:  Past Medical History:  Diagnosis Date  . Asthma   . Diabetes mellitus without complication ([place])   . GERD (gastroesophageal reflux disease)   . Heart murmur    from childhood rheumatic fever, per patient  . Hyperlipidemia   . Pneumonia 12/14  . Wears partial dentures    top and bottom partials    SURGICAL HISTORY: Past Surgical History:  Procedure Laterality Date  . ANTERIOR LAT LUMBAR FUSION Left 07/31/2013   Procedure: ANTERIOR LATERAL LUMBAR  FUSION 1 LEVEL/Left sided lumbar 3-4 lateral interbody fusion with instrumentation and allograft.;  Surgeon: Sinclair Ship, MD;  Location: [facility];  Service: Orthopedics;  Laterality: Left;   Left sided lumbar 3-4 lateral interbody fusion with instrumentation and allograft.  . APPENDECTOMY  30- 40 years  . carpal tunnel     right arm  . CARPAL TUNNEL RELEASE Right 12/15/2013   Procedure: RIGHT CARPAL TUNNEL RELEASE ENDOSCOPIC;  Surgeon: Jolyn Nap, MD;  Location: [facility];  Service: Orthopedics;  Laterality: Right;  . LATERAL EPICONDYLE RELEASE Right 12/15/2013   Procedure: RIGHT ULNAR NEUROPLASTY AT ELBOW ;  Surgeon: Jolyn Nap, MD;  Location: [facility];  Service: Orthopedics;  Laterality: Right;  . LUMBAR FUSION      SOCIAL HISTORY: Social History   Socioeconomic History  . Marital status: Married    Spouse name: Not on file  . Number of children: Not on file  . Years of education: Not on file  . Highest education level: Not on file  Occupational History  . Not on file  Tobacco Use  . Smoking status: Former Smoker    Quit date: 08/30/2016    Years since quitting: 4.2  . Smokeless tobacco: Never Used  Substance and Sexual Activity  . Alcohol use: No    Comment: beer everynow and then per patient  . Drug use: No  . Sexual activity: Not Currently    Comment: says she quit 12/14  Other Topics Concern  . Not on file  Social History Narrative   ** Merged History Encounter **       Social Determinants of Health   Financial Resource Strain: Not on file  Food Insecurity: Not on file  Transportation Needs: Not on file  Physical Activity: Not on file  Stress: Not on file  Social Connections: Not on file  Intimate Partner Violence: Not on file    FAMILY HISTORY: Family History  Problem Relation Age of Onset  . Diabetes Mother   . Heart disease Mother     ALLERGIES:  has No Known Allergies.  MEDICATIONS:  Current Outpatient Medications  Medication Sig Dispense Refill  . ACCU-CHEK AVIVA PLUS test strip USE AS DIRECTED FOR 30 DAYS  6  . albuterol (PROVENTIL HFA;VENTOLIN HFA) 108 (90 Base) MCG/ACT inhaler Inhale 1-2  puffs into the lungs every 6 (six) hours as needed for wheezing or shortness of breath. 1 Inhaler 0  . baclofen (LIORESAL) 10 MG tablet Take 10 mg by mouth 3 (three) times daily as needed for muscle spasms.    . Cholecalciferol (VITAMIN D) 50 MCG (2000 UT) tablet Take 2,000 Units by mouth daily.    Marland Kitchen JANUMET 50-1000 MG tablet Take 1 tablet by mouth 2 (two) times daily.    Marland Kitchen LANTUS SOLOSTAR 100 UNIT/ML Solostar Pen Inject 40 Units into the skin at bedtime. (Patient taking differently: Inject 45 Units into the skin at bedtime.) 15 mL 4  . lisinopril-hydrochlorothiazide (PRINZIDE,ZESTORETIC) 20-12.5 MG tablet Take 1 tablet by mouth daily. 90 tablet 1  . methocarbamol (ROBAXIN) 500 MG tablet Take 1 tablet (500 mg total) by mouth 4 (four) times daily. 120 tablet 3  . montelukast (SINGULAIR) 10 MG tablet Take 1 tablet (10 mg total) by mouth every evening. 90 tablet 3  . oxyCODONE-acetaminophen (PERCOCET) 10-325 MG tablet Take 1 tablet by mouth 5 (five) times daily.    . pantoprazole (PROTONIX) 20 MG tablet Take 1 tablet (20 mg total) by mouth  daily. 30 tablet 1  . RELION PEN NEEDLE 31G/8MM 31G X 8 MM MISC Inject 1 each into the skin daily. use as directed 100 each 2  . rosuvastatin (CRESTOR) 10 MG tablet Take 1 tablet (10 mg total) by mouth daily. 90 tablet 3  . rosuvastatin (CRESTOR) 40 MG tablet Take 40 mg by mouth every evening.    . sertraline (ZOLOFT) 25 MG tablet Take 1 tablet (25 mg total) by mouth daily. 90 tablet 1  . VASCEPA 1 g capsule Take 2 g by mouth 2 (two) times daily.     No current facility-administered medications for this visit.     PHYSICAL EXAMINATION:  ECOG PERFORMANCE STATUS: 0 - Asymptomatic  Vitals:   12/14/20 0841  BP: 117/65  Pulse: 81  Resp: 17  Temp: (!) 97 F (36.1 C)  SpO2: 93%   Filed Weights   12/14/20 0841  Weight: 158 lb 6.4 oz (71.8 kg)    GENERAL:alert, no distress and comfortable SKIN: skin color, texture, turgor are normal, no rashes or  significant lesions EYES: normal, conjunctiva are pink and non-injected, sclera clear OROPHARYNX:no exudate, no erythema and lips, buccal mucosa, and tongue normal  NECK: supple, thyroid normal size, non-tender, without nodularity LYMPH:  no palpable lymphadenopathy in the cervical, axillary or inguinal LUNGS: clear to auscultation and percussion with normal breathing effort HEART: regular rate & rhythm and no murmurs and no lower extremity edema ABDOMEN:abdomen soft, non-tender and normal bowel sounds Musculoskeletal:no cyanosis of digits and no clubbing  PSYCH: alert & oriented x 3 with fluent speech NEURO: no focal motor/sensory deficits  LABORATORY DATA:  I have reviewed the data as listed Lab Results  Component Value Date   WBC 12.8 (H) 11/11/2020   HGB 15.3 (H) 11/11/2020   HCT 45.9 11/11/2020   MCV 92.4 11/11/2020   PLT 300 11/11/2020     Chemistry      Component Value Date/Time   NA 135 11/11/2020 1110   NA 139 09/11/2018 1557   K 4.8 11/11/2020 1110   CL 99 11/11/2020 1110   CO2 28 11/11/2020 1110   BUN 6 11/11/2020 1110   BUN 7 09/11/2018 1557   CREATININE 0.99 11/11/2020 1110      Component Value Date/Time   CALCIUM 10.4 (H) 11/11/2020 1110   ALKPHOS 65 11/11/2020 1110   AST 13 (L) 11/11/2020 1110   ALT 14 11/11/2020 1110   BILITOT 0.2 (L) 11/11/2020 1110     RADIOGRAPHIC STUDIES: I have personally reviewed the radiological images as listed and agreed with the findings in the report. No results found.   I reviewed all the labs with her.  CBC continues to show mild leukocytosis with some absolute lymphocytosis.  Flow negative.  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  Myeloproliferative work-up is unremarkable.  All questions were answered. The patient knows to call the clinic with any problems, questions or concerns. I spent 20 minutes in the care of this patient including H and P, review of records,  counseling and coordination of care.     Benay Pike, MD 12/14/2020 8:42 AM

## 2020-12-14 NOTE — Telephone Encounter (Signed)
Scheduled per los. Gave avs and calendar  

## 2020-12-15 ENCOUNTER — Encounter: Payer: Self-pay | Admitting: Hematology and Oncology

## 2021-04-14 ENCOUNTER — Telehealth: Payer: Self-pay | Admitting: Hematology and Oncology

## 2021-04-14 ENCOUNTER — Ambulatory Visit: Payer: Medicare HMO | Admitting: Hematology and Oncology

## 2021-04-14 ENCOUNTER — Telehealth: Payer: Self-pay

## 2021-04-14 NOTE — Telephone Encounter (Signed)
LVM for patient regarding missed office visit this morning, 7/7. Messages sent to schedulers to reschedule at patient's convenience.

## 2021-04-14 NOTE — Telephone Encounter (Signed)
R/s appt per 7/7 sch msg. Pt aware.  

## 2021-04-18 NOTE — Progress Notes (Signed)
[name], [name]  CHIEF COMPLAINTS/PURPOSE OF CONSULTATION:  Leukocytosis.  ASSESSMENT & PLAN:   No problem-specific Assessment & Plan notes found for this encounter.  This is a very pleasant 55 year old female patient with past medical history significant for type 2 diabetes mellitus, asthma and dyslipidemia referred to hematology for evaluation of leukocytosis.   During our last visit, I recommended that she consider MPN work up and flow cytometry given lymphocytosis Flow unremarkable MPN work up negative. I have reviewed pertinent records and it is likely that she has reactive leukocytosis. At this time since clinically she is feeling better too, I recommend surveillance. She is agreeable to this plan.  Plan  RTC in 3/4 months.  No orders of the defined types were placed in this encounter.    HISTORY OF PRESENTING ILLNESS:   [name] 55 y.o. female is here because of persistent leukocytosis.  This is a very pleasant 55 year old female patient with past medical history significant for asthma, type 2 diabetes mellitus, dyslipidemia referred to hematology for evaluation of leukocytosis.    Interval History  Since last visit, patient has been doing really well.   REVIEW OF SYSTEMS:   Constitutional: Denies fevers, chills or abnormal night sweats she has some baseline night sweats from menopause but not drenching. Eyes: Denies blurriness of vision, double vision or watery eyes Ears, nose, mouth, throat, and face: Denies mucositis or sore throat Respiratory: Denies cough, dyspnea or wheezes Cardiovascular: Denies chest discomfort or lower extremity swelling Gastrointestinal:  Denies nausea, heartburn or change in bowel habits Skin: Denies abnormal skin rashes Lymphatics: Denies new lymphadenopathy or easy bruising Neurological:Denies numbness, tingling or new  weaknesses Behavioral/Psych: anxious All other systems were reviewed with the patient and are negative.  MEDICAL HISTORY:  Past Medical History:  Diagnosis Date  . Asthma   . Diabetes mellitus without complication ([facility])   . GERD (gastroesophageal reflux disease)   . Heart murmur    from childhood rheumatic fever, per patient  . Hyperlipidemia   . Pneumonia 12/14  . Wears partial dentures    top and bottom partials    SURGICAL HISTORY: Past Surgical History:  Procedure Laterality Date  . ANTERIOR LAT LUMBAR FUSION Left 07/31/2013   Procedure: ANTERIOR LATERAL LUMBAR FUSION 1 LEVEL/Left sided lumbar 3-4 lateral interbody fusion with instrumentation and allograft.;  Surgeon: Sinclair Ship, MD;  Location: [facility];  Service: Orthopedics;  Laterality: Left;  Left sided lumbar 3-4 lateral interbody fusion with instrumentation and allograft.  . APPENDECTOMY  30- 40 years  . carpal tunnel     right arm  . CARPAL TUNNEL RELEASE Right 12/15/2013   Procedure: RIGHT CARPAL TUNNEL RELEASE ENDOSCOPIC;  Surgeon: Jolyn Nap, MD;  Location: [facility];  Service: Orthopedics;  Laterality: Right;  . LATERAL EPICONDYLE RELEASE Right 12/15/2013   Procedure: RIGHT ULNAR NEUROPLASTY AT ELBOW ;  Surgeon: Jolyn Nap, MD;  Location: [facility];  Service: Orthopedics;  Laterality: Right;  . LUMBAR FUSION      SOCIAL HISTORY: Social History   Socioeconomic History  . Marital status: Married    Spouse name: Not on file  . Number of children: Not on file  . Years of education: Not on file  . Highest education level: Not on file  Occupational History  . Not on file  Tobacco Use  . Smoking status: Former    Pack years:  0.00    Types: Cigarettes    Quit date: 08/30/2016    Years since quitting: 4.6  . Smokeless tobacco: Never  Substance and Sexual Activity  . Alcohol use: No    Comment: beer everynow and then per patient  . Drug use: No  . Sexual  activity: Not Currently    Comment: says she quit 12/14  Other Topics Concern  . Not on file  Social History Narrative   ** Merged History Encounter **       Social Determinants of Health   Financial Resource Strain: Not on file  Food Insecurity: Not on file  Transportation Needs: Not on file  Physical Activity: Not on file  Stress: Not on file  Social Connections: Not on file  Intimate Partner Violence: Not on file    FAMILY HISTORY: Family History  Problem Relation Age of Onset  . Diabetes Mother   . Heart disease Mother     ALLERGIES:  has No Known Allergies.  MEDICATIONS:  Current Outpatient Medications  Medication Sig Dispense Refill  . ACCU-CHEK AVIVA PLUS test strip USE AS DIRECTED FOR 30 DAYS  6  . albuterol (PROVENTIL HFA;VENTOLIN HFA) 108 (90 Base) MCG/ACT inhaler Inhale 1-2 puffs into the lungs every 6 (six) hours as needed for wheezing or shortness of breath. 1 Inhaler 0  . baclofen (LIORESAL) 10 MG tablet Take 10 mg by mouth 3 (three) times daily as needed for muscle spasms.    . Cholecalciferol (VITAMIN D) 50 MCG (2000 UT) tablet Take 2,000 Units by mouth daily.    Marland Kitchen JANUMET 50-1000 MG tablet Take 1 tablet by mouth 2 (two) times daily.    Marland Kitchen LANTUS SOLOSTAR 100 UNIT/ML Solostar Pen Inject 40 Units into the skin at bedtime. (Patient taking differently: Inject 45 Units into the skin at bedtime.) 15 mL 4  . lisinopril-hydrochlorothiazide (PRINZIDE,ZESTORETIC) 20-12.5 MG tablet Take 1 tablet by mouth daily. 90 tablet 1  . methocarbamol (ROBAXIN) 500 MG tablet Take 1 tablet (500 mg total) by mouth 4 (four) times daily. 120 tablet 3  . montelukast (SINGULAIR) 10 MG tablet Take 1 tablet (10 mg total) by mouth every evening. 90 tablet 3  . oxyCODONE-acetaminophen (PERCOCET) 10-325 MG tablet Take 1 tablet by mouth 5 (five) times daily.    . pantoprazole (PROTONIX) 20 MG tablet Take 1 tablet (20 mg total) by mouth daily. 30 tablet 1  . RELION PEN NEEDLE 31G/8MM 31G X 8 MM  MISC Inject 1 each into the skin daily. use as directed 100 each 2  . rosuvastatin (CRESTOR) 10 MG tablet Take 1 tablet (10 mg total) by mouth daily. 90 tablet 3  . rosuvastatin (CRESTOR) 40 MG tablet Take 40 mg by mouth every evening.    . sertraline (ZOLOFT) 25 MG tablet Take 1 tablet (25 mg total) by mouth daily. 90 tablet 1  . VASCEPA 1 g capsule Take 2 g by mouth 2 (two) times daily.     No current facility-administered medications for this visit.     PHYSICAL EXAMINATION:  ECOG PERFORMANCE STATUS: 0 - Asymptomatic  There were no vitals filed for this visit.  There were no vitals filed for this visit.   GENERAL:alert, no distress and comfortable SKIN: skin color, texture, turgor are normal, no rashes or significant lesions EYES: normal, conjunctiva are pink and non-injected, sclera clear OROPHARYNX:no exudate, no erythema and lips, buccal mucosa, and tongue normal  NECK: supple, thyroid normal size, non-tender, without nodularity LYMPH:  no palpable  lymphadenopathy in the cervical, axillary or inguinal LUNGS: clear to auscultation and percussion with normal breathing effort HEART: regular rate & rhythm and no murmurs and no lower extremity edema ABDOMEN:abdomen soft, non-tender and normal bowel sounds Musculoskeletal:no cyanosis of digits and no clubbing  PSYCH: alert & oriented x 3 with fluent speech NEURO: no focal motor/sensory deficits  LABORATORY DATA:  I have reviewed the data as listed Lab Results  Component Value Date   WBC 12.8 (H) 11/11/2020   HGB 15.3 (H) 11/11/2020   HCT 45.9 11/11/2020   MCV 92.4 11/11/2020   PLT 300 11/11/2020     Chemistry      Component Value Date/Time   NA 135 11/11/2020 1110   NA 139 09/11/2018 1557   K 4.8 11/11/2020 1110   CL 99 11/11/2020 1110   CO2 28 11/11/2020 1110   BUN 6 11/11/2020 1110   BUN 7 09/11/2018 1557   CREATININE 0.99 11/11/2020 1110      Component Value Date/Time   CALCIUM 10.4 (H) 11/11/2020 1110    ALKPHOS 65 11/11/2020 1110   AST 13 (L) 11/11/2020 1110   ALT 14 11/11/2020 1110   BILITOT 0.2 (L) 11/11/2020 1110     RADIOGRAPHIC STUDIES: I have personally reviewed the radiological images as listed and agreed with the findings in the report. No results found.     All questions were answered. The patient knows to call the clinic with any problems, questions or concerns. I spent 20 minutes in the care of this patient including H and P, review of records, counseling and coordination of care.     Benay Pike, MD 04/18/2021 9:48 PM   This encounter was created in error - please disregard. This encounter was created in error - please disregard.

## 2021-04-19 ENCOUNTER — Ambulatory Visit: Payer: Medicare HMO | Admitting: Hematology and Oncology

## 2021-04-19 ENCOUNTER — Telehealth: Payer: Self-pay | Admitting: Hematology and Oncology

## 2021-04-19 ENCOUNTER — Telehealth: Payer: Self-pay

## 2021-04-19 NOTE — Telephone Encounter (Signed)
Scheduled appointment per 07/12 sch msg. Patient is aware. 

## 2021-04-19 NOTE — Telephone Encounter (Signed)
LVM for patient regarding missed office visit from today, 7/12. Message sent to schedulers to get patient rescheduled at her earliest convenience.

## 2021-04-20 DIAGNOSIS — K861 Other chronic pancreatitis: Secondary | ICD-10-CM | POA: Insufficient documentation

## 2021-04-26 ENCOUNTER — Other Ambulatory Visit: Payer: Self-pay

## 2021-04-26 ENCOUNTER — Inpatient Hospital Stay: Payer: Medicare HMO

## 2021-04-26 ENCOUNTER — Encounter: Payer: Self-pay | Admitting: Hematology and Oncology

## 2021-04-26 ENCOUNTER — Inpatient Hospital Stay: Payer: Medicare HMO | Attending: Hematology and Oncology | Admitting: Hematology and Oncology

## 2021-04-26 VITALS — BP 138/83 | HR 78 | Temp 98.4°F | Resp 18 | Wt 167.8 lb

## 2021-04-26 DIAGNOSIS — D72820 Lymphocytosis (symptomatic): Secondary | ICD-10-CM

## 2021-04-26 DIAGNOSIS — D72829 Elevated white blood cell count, unspecified: Secondary | ICD-10-CM

## 2021-04-26 DIAGNOSIS — Z87891 Personal history of nicotine dependence: Secondary | ICD-10-CM | POA: Diagnosis not present

## 2021-04-26 DIAGNOSIS — D751 Secondary polycythemia: Secondary | ICD-10-CM

## 2021-04-26 LAB — CMP (CANCER CENTER ONLY)
ALT: 20 U/L (ref 0–44)
AST: 20 U/L (ref 15–41)
Albumin: 3.7 g/dL (ref 3.5–5.0)
Alkaline Phosphatase: 68 U/L (ref 38–126)
Anion gap: 11 (ref 5–15)
BUN: 11 mg/dL (ref 6–20)
CO2: 27 mmol/L (ref 22–32)
Calcium: 10 mg/dL (ref 8.9–10.3)
Chloride: 102 mmol/L (ref 98–111)
Creatinine: 1.07 mg/dL — ABNORMAL HIGH (ref 0.44–1.00)
GFR, Estimated: >60 mL/min (ref >60)
Glucose, Bld: 310 mg/dL — ABNORMAL HIGH (ref 70–99)
Potassium: 4.2 mmol/L (ref 3.5–5.1)
Sodium: 140 mmol/L (ref 135–145)
Total Bilirubin: <0.2 mg/dL — ABNORMAL LOW (ref 0.3–1.2)
Total Protein: 8 g/dL (ref 6.5–8.1)

## 2021-04-26 LAB — CBC WITH DIFFERENTIAL/PLATELET
Abs Immature Granulocytes: 0.05 10*3/uL (ref 0.00–0.07)
Basophils Absolute: 0.1 10*3/uL (ref 0.0–0.1)
Basophils Relative: 1 %
Eosinophils Absolute: 0.1 10*3/uL (ref 0.0–0.5)
Eosinophils Relative: 1 %
HCT: 43.9 % (ref 36.0–46.0)
Hemoglobin: 14.5 g/dL (ref 12.0–15.0)
Immature Granulocytes: 0 %
Lymphocytes Relative: 29 %
Lymphs Abs: 3.3 10*3/uL (ref 0.7–4.0)
MCH: 30.5 pg (ref 26.0–34.0)
MCHC: 33 g/dL (ref 30.0–36.0)
MCV: 92.4 fL (ref 80.0–100.0)
Monocytes Absolute: 0.8 10*3/uL (ref 0.1–1.0)
Monocytes Relative: 7 %
Neutro Abs: 6.9 10*3/uL (ref 1.7–7.7)
Neutrophils Relative %: 62 %
Platelets: 283 10*3/uL (ref 150–400)
RBC: 4.75 MIL/uL (ref 3.87–5.11)
RDW: 12.4 % (ref 11.5–15.5)
WBC: 11.2 10*3/uL — ABNORMAL HIGH (ref 4.0–10.5)
nRBC: 0 % (ref 0.0–0.2)

## 2021-04-26 LAB — LACTATE DEHYDROGENASE: LDH: 203 U/L — ABNORMAL HIGH (ref 98–192)

## 2021-04-26 NOTE — Progress Notes (Signed)
[facility] NOTE  Patient Care Team: [facility], [place] as PCP - General  CHIEF COMPLAINTS/PURPOSE OF CONSULTATION:  Leukocytosis.  ASSESSMENT & PLAN:   No problem-specific Assessment & Plan notes found for this encounter.  This is a very pleasant 55 year old female patient with past medical history significant for type 2 diabetes mellitus, asthma and dyslipidemia referred to hematology for evaluation of leukocytosis.   During our last visit, I recommended that she consider MPN work up and flow cytometry given lymphocytosis Flow unremarkable MPN work up negative. She is here for FU. ROS unremarkable. PE unremarkable  Plan  RTC in 3/4 months.  No orders of the defined types were placed in this encounter.    HISTORY OF PRESENTING ILLNESS:   [name] 55 y.o. female is here because of persistent leukocytosis.  This is a very pleasant 55 year old female patient with past medical history significant for asthma, type 2 diabetes mellitus, dyslipidemia referred to hematology for evaluation of leukocytosis.    Interval History  Since last visit, patient has been doing really well.   REVIEW OF SYSTEMS:   Constitutional: Denies fevers, chills or abnormal night sweats she has some baseline night sweats from menopause but not drenching. Eyes: Denies blurriness of vision, double vision or watery eyes Ears, nose, mouth, throat, and face: Denies mucositis or sore throat Respiratory: Denies cough, dyspnea or wheezes Cardiovascular: Denies chest discomfort or lower extremity swelling Gastrointestinal:  Denies nausea, heartburn or change in bowel habits Skin: Denies abnormal skin rashes Lymphatics: Denies new lymphadenopathy or easy bruising Neurological:Denies numbness, tingling or new weaknesses Behavioral/Psych: anxious All other systems were reviewed with the patient and are negative.  MEDICAL HISTORY:  Past Medical History:  Diagnosis Date    Asthma    Diabetes mellitus without complication ([facility])    GERD (gastroesophageal reflux disease)    Heart murmur    from childhood rheumatic fever, per patient   Hyperlipidemia    Pneumonia 12/14   Wears partial dentures    top and bottom partials    SURGICAL HISTORY: Past Surgical History:  Procedure Laterality Date   ANTERIOR LAT LUMBAR FUSION Left 07/31/2013   Procedure: ANTERIOR LATERAL LUMBAR FUSION 1 LEVEL/Left sided lumbar 3-4 lateral interbody fusion with instrumentation and allograft.;  Surgeon: Sinclair Ship, MD;  Location: [facility];  Service: Orthopedics;  Laterality: Left;  Left sided lumbar 3-4 lateral interbody fusion with instrumentation and allograft.   APPENDECTOMY  30- 40 years   carpal tunnel     right arm   CARPAL TUNNEL RELEASE Right 12/15/2013   Procedure: RIGHT CARPAL TUNNEL RELEASE ENDOSCOPIC;  Surgeon: Jolyn Nap, MD;  Location: [facility];  Service: Orthopedics;  Laterality: Right;   LATERAL EPICONDYLE RELEASE Right 12/15/2013   Procedure: RIGHT ULNAR NEUROPLASTY AT ELBOW ;  Surgeon: Jolyn Nap, MD;  Location: [facility];  Service: Orthopedics;  Laterality: Right;   LUMBAR FUSION      SOCIAL HISTORY: Social History   Socioeconomic History   Marital status: Married    Spouse name: Not on file   Number of children: Not on file   Years of education: Not on file   Highest education level: Not on file  Occupational History   Not on file  Tobacco Use   Smoking status: Former    Types: Cigarettes    Quit date: 08/30/2016    Years since quitting: 4.6   Smokeless tobacco: Never  Substance and Sexual Activity  Alcohol use: No    Comment: beer everynow and then per patient   Drug use: No   Sexual activity: Not Currently    Comment: says she quit 12/14  Other Topics Concern   Not on file  Social History Narrative   ** Merged History Encounter **       Social Determinants of Health   Financial Resource  Strain: Not on file  Food Insecurity: Not on file  Transportation Needs: Not on file  Physical Activity: Not on file  Stress: Not on file  Social Connections: Not on file  Intimate Partner Violence: Not on file    FAMILY HISTORY: Family History  Problem Relation Age of Onset   Diabetes Mother    Heart disease Mother     ALLERGIES:  has No Known Allergies.  MEDICATIONS:  Current Outpatient Medications  Medication Sig Dispense Refill   ACCU-CHEK AVIVA PLUS test strip USE AS DIRECTED FOR 30 DAYS  6   albuterol (PROVENTIL HFA;VENTOLIN HFA) 108 (90 Base) MCG/ACT inhaler Inhale 1-2 puffs into the lungs every 6 (six) hours as needed for wheezing or shortness of breath. 1 Inhaler 0   baclofen (LIORESAL) 10 MG tablet Take 10 mg by mouth 3 (three) times daily as needed for muscle spasms.     Cholecalciferol (VITAMIN D) 50 MCG (2000 UT) tablet Take 2,000 Units by mouth daily.     JANUMET 50-1000 MG tablet Take 1 tablet by mouth 2 (two) times daily.     LANTUS SOLOSTAR 100 UNIT/ML Solostar Pen Inject 40 Units into the skin at bedtime. (Patient taking differently: Inject 45 Units into the skin at bedtime.) 15 mL 4   lisinopril-hydrochlorothiazide (PRINZIDE,ZESTORETIC) 20-12.5 MG tablet Take 1 tablet by mouth daily. 90 tablet 1   methocarbamol (ROBAXIN) 500 MG tablet Take 1 tablet (500 mg total) by mouth 4 (four) times daily. 120 tablet 3   montelukast (SINGULAIR) 10 MG tablet Take 1 tablet (10 mg total) by mouth every evening. 90 tablet 3   oxyCODONE-acetaminophen (PERCOCET) 10-325 MG tablet Take 1 tablet by mouth 5 (five) times daily.     pantoprazole (PROTONIX) 20 MG tablet Take 1 tablet (20 mg total) by mouth daily. 30 tablet 1   RELION PEN NEEDLE 31G/8MM 31G X 8 MM MISC Inject 1 each into the skin daily. use as directed 100 each 2   rosuvastatin (CRESTOR) 10 MG tablet Take 1 tablet (10 mg total) by mouth daily. 90 tablet 3   rosuvastatin (CRESTOR) 40 MG tablet Take 40 mg by mouth every  evening.     sertraline (ZOLOFT) 25 MG tablet Take 1 tablet (25 mg total) by mouth daily. 90 tablet 1   VASCEPA 1 g capsule Take 2 g by mouth 2 (two) times daily.     No current facility-administered medications for this visit.     PHYSICAL EXAMINATION:  ECOG PERFORMANCE STATUS: 0 - Asymptomatic  Vitals:   04/26/21 0838  BP: 138/83  Pulse: 78  Resp: 18  Temp: 98.4 F (36.9 C)  SpO2: 96%    Filed Weights   04/26/21 0838  Weight: 167 lb 12.8 oz (76.1 kg)     GENERAL:alert, no distress and comfortable SKIN: skin color, texture, turgor are normal, no rashes or significant lesions EYES: normal, conjunctiva are pink and non-injected, sclera clear OROPHARYNX:no exudate, no erythema and lips, buccal mucosa, and tongue normal  NECK: supple, thyroid normal size, non-tender, without nodularity LYMPH:  no palpable lymphadenopathy in the cervical, axillary or  inguinal LUNGS: clear to auscultation and percussion with normal breathing effort HEART: regular rate & rhythm and no murmurs and no lower extremity edema ABDOMEN:abdomen soft, non-tender and normal bowel sounds Musculoskeletal:no cyanosis of digits and no clubbing  PSYCH: alert & oriented x 3 with fluent speech NEURO: no focal motor/sensory deficits  LABORATORY DATA:  I have reviewed the data as listed Lab Results  Component Value Date   WBC 12.8 (H) 11/11/2020   HGB 15.3 (H) 11/11/2020   HCT 45.9 11/11/2020   MCV 92.4 11/11/2020   PLT 300 11/11/2020     Chemistry      Component Value Date/Time   NA 135 11/11/2020 1110   NA 139 09/11/2018 1557   K 4.8 11/11/2020 1110   CL 99 11/11/2020 1110   CO2 28 11/11/2020 1110   BUN 6 11/11/2020 1110   BUN 7 09/11/2018 1557   CREATININE 0.99 11/11/2020 1110      Component Value Date/Time   CALCIUM 10.4 (H) 11/11/2020 1110   ALKPHOS 65 11/11/2020 1110   AST 13 (L) 11/11/2020 1110   ALT 14 11/11/2020 1110   BILITOT 0.2 (L) 11/11/2020 1110     RADIOGRAPHIC STUDIES: I  have personally reviewed the radiological images as listed and agreed with the findings in the report. No results found.     All questions were answered. The patient knows to call the clinic with any problems, questions or concerns. I spent 20 minutes in the care of this patient including H and P, review of records, counseling and coordination of care.     Benay Pike, MD 04/26/2021 8:55 AM

## 2021-04-26 NOTE — Progress Notes (Signed)
[name] NOTE  Patient Care Team: [facility] [place] as PCP - General  CHIEF COMPLAINTS/PURPOSE OF CONSULTATION:  Leukocytosis surveillance.  ASSESSMENT & PLAN:  This is a very pleasant 55 year old female patient with past medical history significant for type 2 diabetes mellitus, asthma and dyslipidemia referred to hematology for evaluation of leukocytosis and lymphocytosis. During our last visit, I recommended that she consider MPN work up and flow cytometry given lymphocytosis and leukocytosis. Flow unremarkable MPN work up negative. We recommended surveillance, hence she is here for FU. She is doing well today, no complaints at all except for weight gain. She also had a left breast biopsy recently, we dont have the results, but patient mentioned that the result is benign. PE today no concerns Repeat labs today  Plan  RTC in 6 months.    HISTORY OF PRESENTING ILLNESS:   [name] 55 y.o. female is here because of persistent leukocytosis.  This is a very pleasant 55 year old female patient with past medical history significant for asthma, type 2 diabetes mellitus, dyslipidemia referred to hematology for evaluation of leukocytosis.    Interval History  Since last visit, patient has been doing really well.  No complaints today. She had a mammogram and left breast biopsy, results benign according to patient. No change in breathing, bowel or urinary habits No new neurological complaints. Rest of the pertinent 10 point ROS reviewed and negative  MEDICAL HISTORY:  Past Medical History:  Diagnosis Date   Asthma    Diabetes mellitus without complication (HCC)    GERD (gastroesophageal reflux disease)    Heart murmur    from childhood rheumatic fever, per patient   Hyperlipidemia    Pneumonia 12/14   Wears partial dentures    top and bottom partials    SURGICAL HISTORY: Past Surgical History:  Procedure Laterality Date   ANTERIOR LAT  LUMBAR FUSION Left 07/31/2013   Procedure: ANTERIOR LATERAL LUMBAR FUSION 1 LEVEL/Left sided lumbar 3-4 lateral interbody fusion with instrumentation and allograft.;  Surgeon: Sinclair Ship, MD;  Location: [facility];  Service: Orthopedics;  Laterality: Left;  Left sided lumbar 3-4 lateral interbody fusion with instrumentation and allograft.   APPENDECTOMY  30- 40 years   carpal tunnel     right arm   CARPAL TUNNEL RELEASE Right 12/15/2013   Procedure: RIGHT CARPAL TUNNEL RELEASE ENDOSCOPIC;  Surgeon: Jolyn Nap, MD;  Location: [facility];  Service: Orthopedics;  Laterality: Right;   LATERAL EPICONDYLE RELEASE Right 12/15/2013   Procedure: RIGHT ULNAR NEUROPLASTY AT ELBOW ;  Surgeon: Jolyn Nap, MD;  Location: [facility];  Service: Orthopedics;  Laterality: Right;   LUMBAR FUSION      SOCIAL HISTORY: Social History   Socioeconomic History   Marital status: Married    Spouse name: Not on file   Number of children: Not on file   Years of education: Not on file   Highest education level: Not on file  Occupational History   Not on file  Tobacco Use   Smoking status: Former    Types: Cigarettes    Quit date: 08/30/2016    Years since quitting: 4.6   Smokeless tobacco: Never  Substance and Sexual Activity   Alcohol use: No    Comment: beer everynow and then per patient   Drug use: No   Sexual activity: Not Currently    Comment: says she quit 12/14  Other Topics Concern   Not on file  Social History Narrative   ** Merged History Encounter **       Social Determinants of Health   Financial Resource Strain: Not on file  Food Insecurity: Not on file  Transportation Needs: Not on file  Physical Activity: Not on file  Stress: Not on file  Social Connections: Not on file  Intimate Partner Violence: Not on file    FAMILY HISTORY: Family History  Problem Relation Age of Onset   Diabetes Mother    Heart disease Mother     ALLERGIES:   has No Known Allergies.  MEDICATIONS:  Current Outpatient Medications  Medication Sig Dispense Refill   ACCU-CHEK AVIVA PLUS test strip USE AS DIRECTED FOR 30 DAYS  6   albuterol (PROVENTIL HFA;VENTOLIN HFA) 108 (90 Base) MCG/ACT inhaler Inhale 1-2 puffs into the lungs every 6 (six) hours as needed for wheezing or shortness of breath. 1 Inhaler 0   baclofen (LIORESAL) 10 MG tablet Take 10 mg by mouth 3 (three) times daily as needed for muscle spasms.     Cholecalciferol (VITAMIN D) 50 MCG (2000 UT) tablet Take 2,000 Units by mouth daily.     JANUMET 50-1000 MG tablet Take 1 tablet by mouth 2 (two) times daily.     LANTUS SOLOSTAR 100 UNIT/ML Solostar Pen Inject 40 Units into the skin at bedtime. (Patient taking differently: Inject 45 Units into the skin at bedtime.) 15 mL 4   lisinopril-hydrochlorothiazide (PRINZIDE,ZESTORETIC) 20-12.5 MG tablet Take 1 tablet by mouth daily. 90 tablet 1   methocarbamol (ROBAXIN) 500 MG tablet Take 1 tablet (500 mg total) by mouth 4 (four) times daily. 120 tablet 3   montelukast (SINGULAIR) 10 MG tablet Take 1 tablet (10 mg total) by mouth every evening. 90 tablet 3   oxyCODONE-acetaminophen (PERCOCET) 10-325 MG tablet Take 1 tablet by mouth 5 (five) times daily.     pantoprazole (PROTONIX) 20 MG tablet Take 1 tablet (20 mg total) by mouth daily. 30 tablet 1   RELION PEN NEEDLE 31G/8MM 31G X 8 MM MISC Inject 1 each into the skin daily. use as directed 100 each 2   rosuvastatin (CRESTOR) 10 MG tablet Take 1 tablet (10 mg total) by mouth daily. 90 tablet 3   rosuvastatin (CRESTOR) 40 MG tablet Take 40 mg by mouth every evening.     sertraline (ZOLOFT) 25 MG tablet Take 1 tablet (25 mg total) by mouth daily. 90 tablet 1   VASCEPA 1 g capsule Take 2 g by mouth 2 (two) times daily.     No current facility-administered medications for this visit.     PHYSICAL EXAMINATION:  ECOG PERFORMANCE STATUS: 0 - Asymptomatic  Vitals:   04/26/21 0838  BP: 138/83   Pulse: 78  Resp: 18  Temp: 98.4 F (36.9 C)  SpO2: 96%    Filed Weights   04/26/21 0838  Weight: 167 lb 12.8 oz (76.1 kg)    GENERAL:alert, no distress and comfortable SKIN: skin color, texture, turgor are normal, no rashes or significant lesions EYES: normal, conjunctiva are pink and non-injected, sclera clear OROPHARYNX:no exudate, no erythema and lips, buccal mucosa, and tongue normal  NECK: supple, thyroid normal size, non-tender, without nodularity LYMPH:  no palpable lymphadenopathy in the cervical, axillary or inguinal LUNGS: clear to auscultation and percussion with normal breathing effort HEART: regular rate & rhythm and no murmurs and no lower extremity edema ABDOMEN:abdomen soft, non-tender and normal bowel sounds Musculoskeletal:no cyanosis of digits and no clubbing  PSYCH: alert & oriented  x 3 with fluent speech NEURO: no focal motor/sensory deficits  LABORATORY DATA:  I have reviewed the data as listed Lab Results  Component Value Date   WBC 11.2 (H) 04/26/2021   HGB 14.5 04/26/2021   HCT 43.9 04/26/2021   MCV 92.4 04/26/2021   PLT 283 04/26/2021     Chemistry      Component Value Date/Time   NA 135 11/11/2020 1110   NA 139 09/11/2018 1557   K 4.8 11/11/2020 1110   CL 99 11/11/2020 1110   CO2 28 11/11/2020 1110   BUN 6 11/11/2020 1110   BUN 7 09/11/2018 1557   CREATININE 0.99 11/11/2020 1110      Component Value Date/Time   CALCIUM 10.4 (H) 11/11/2020 1110   ALKPHOS 65 11/11/2020 1110   AST 13 (L) 11/11/2020 1110   ALT 14 11/11/2020 1110   BILITOT 0.2 (L) 11/11/2020 1110     RADIOGRAPHIC STUDIES: I have personally reviewed the radiological images as listed and agreed with the findings in the report. No results found.     All questions were answered. The patient knows to call the clinic with any problems, questions or concerns. I spent 20 minutes in the care of this patient including H and P, review of records, counseling and coordination  of care.     Benay Pike, MD 04/26/2021 9:34 AM   This encounter was created in error - please disregard. This encounter was created in error - please disregard.

## 2021-05-10 LAB — BCR/ABL

## 2021-09-30 ENCOUNTER — Emergency Department (HOSPITAL_COMMUNITY): Payer: Medicare HMO

## 2021-09-30 ENCOUNTER — Inpatient Hospital Stay (HOSPITAL_COMMUNITY)
Admission: EM | Admit: 2021-09-30 | Discharge: 2021-10-26 | DRG: 004 | Disposition: A | Discharge status: Home / Self Care | Payer: Medicare HMO | Attending: Internal Medicine | Admitting: Internal Medicine

## 2021-09-30 DIAGNOSIS — J9602 Acute respiratory failure with hypercapnia: Secondary | ICD-10-CM | POA: Diagnosis present

## 2021-09-30 DIAGNOSIS — E1165 Type 2 diabetes mellitus with hyperglycemia: Secondary | ICD-10-CM | POA: Diagnosis present

## 2021-09-30 DIAGNOSIS — J189 Pneumonia, unspecified organism: Principal | ICD-10-CM | POA: Diagnosis present

## 2021-09-30 DIAGNOSIS — Z978 Presence of other specified devices: Secondary | ICD-10-CM

## 2021-09-30 DIAGNOSIS — Z20822 Contact with and (suspected) exposure to covid-19: Secondary | ICD-10-CM | POA: Diagnosis present

## 2021-09-30 DIAGNOSIS — K861 Other chronic pancreatitis: Secondary | ICD-10-CM | POA: Diagnosis present

## 2021-09-30 DIAGNOSIS — Z9289 Personal history of other medical treatment: Secondary | ICD-10-CM

## 2021-09-30 DIAGNOSIS — R1313 Dysphagia, pharyngeal phase: Secondary | ICD-10-CM | POA: Diagnosis present

## 2021-09-30 DIAGNOSIS — I5033 Acute on chronic diastolic (congestive) heart failure: Secondary | ICD-10-CM | POA: Diagnosis present

## 2021-09-30 DIAGNOSIS — R6521 Severe sepsis with septic shock: Secondary | ICD-10-CM | POA: Diagnosis present

## 2021-09-30 DIAGNOSIS — F1910 Other psychoactive substance abuse, uncomplicated: Secondary | ICD-10-CM | POA: Diagnosis not present

## 2021-09-30 DIAGNOSIS — J810 Acute pulmonary edema: Secondary | ICD-10-CM

## 2021-09-30 DIAGNOSIS — E785 Hyperlipidemia, unspecified: Secondary | ICD-10-CM | POA: Diagnosis present

## 2021-09-30 DIAGNOSIS — E440 Moderate protein-calorie malnutrition: Secondary | ICD-10-CM | POA: Diagnosis present

## 2021-09-30 DIAGNOSIS — J9601 Acute respiratory failure with hypoxia: Secondary | ICD-10-CM | POA: Diagnosis present

## 2021-09-30 DIAGNOSIS — G9341 Metabolic encephalopathy: Secondary | ICD-10-CM | POA: Diagnosis present

## 2021-09-30 DIAGNOSIS — R0902 Hypoxemia: Secondary | ICD-10-CM

## 2021-09-30 DIAGNOSIS — N179 Acute kidney failure, unspecified: Secondary | ICD-10-CM | POA: Diagnosis present

## 2021-09-30 DIAGNOSIS — I161 Hypertensive emergency: Secondary | ICD-10-CM | POA: Diagnosis present

## 2021-09-30 DIAGNOSIS — Z01818 Encounter for other preprocedural examination: Secondary | ICD-10-CM

## 2021-09-30 DIAGNOSIS — F1410 Cocaine abuse, uncomplicated: Secondary | ICD-10-CM | POA: Diagnosis present

## 2021-09-30 DIAGNOSIS — Z87891 Personal history of nicotine dependence: Secondary | ICD-10-CM

## 2021-09-30 DIAGNOSIS — J9611 Chronic respiratory failure with hypoxia: Secondary | ICD-10-CM

## 2021-09-30 DIAGNOSIS — E11649 Type 2 diabetes mellitus with hypoglycemia without coma: Secondary | ICD-10-CM | POA: Diagnosis not present

## 2021-09-30 DIAGNOSIS — Z79899 Other long term (current) drug therapy: Secondary | ICD-10-CM

## 2021-09-30 DIAGNOSIS — T17908S Unspecified foreign body in respiratory tract, part unspecified causing other injury, sequela: Secondary | ICD-10-CM

## 2021-09-30 DIAGNOSIS — R112 Nausea with vomiting, unspecified: Secondary | ICD-10-CM | POA: Diagnosis not present

## 2021-09-30 DIAGNOSIS — R0603 Acute respiratory distress: Secondary | ICD-10-CM | POA: Diagnosis present

## 2021-09-30 DIAGNOSIS — G9340 Encephalopathy, unspecified: Secondary | ICD-10-CM | POA: Diagnosis not present

## 2021-09-30 DIAGNOSIS — J441 Chronic obstructive pulmonary disease with (acute) exacerbation: Secondary | ICD-10-CM | POA: Diagnosis present

## 2021-09-30 DIAGNOSIS — R001 Bradycardia, unspecified: Secondary | ICD-10-CM | POA: Diagnosis present

## 2021-09-30 DIAGNOSIS — J440 Chronic obstructive pulmonary disease with acute lower respiratory infection: Secondary | ICD-10-CM | POA: Diagnosis present

## 2021-09-30 DIAGNOSIS — R609 Edema, unspecified: Secondary | ICD-10-CM | POA: Diagnosis not present

## 2021-09-30 DIAGNOSIS — Z8249 Family history of ischemic heart disease and other diseases of the circulatory system: Secondary | ICD-10-CM

## 2021-09-30 DIAGNOSIS — K219 Gastro-esophageal reflux disease without esophagitis: Secondary | ICD-10-CM | POA: Diagnosis present

## 2021-09-30 DIAGNOSIS — F05 Delirium due to known physiological condition: Secondary | ICD-10-CM | POA: Diagnosis present

## 2021-09-30 DIAGNOSIS — Y95 Nosocomial condition: Secondary | ICD-10-CM | POA: Diagnosis present

## 2021-09-30 DIAGNOSIS — Z930 Tracheostomy status: Secondary | ICD-10-CM

## 2021-09-30 DIAGNOSIS — Z4659 Encounter for fitting and adjustment of other gastrointestinal appliance and device: Secondary | ICD-10-CM

## 2021-09-30 DIAGNOSIS — Z794 Long term (current) use of insulin: Secondary | ICD-10-CM

## 2021-09-30 DIAGNOSIS — I110 Hypertensive heart disease with heart failure: Secondary | ICD-10-CM | POA: Diagnosis present

## 2021-09-30 DIAGNOSIS — A419 Sepsis, unspecified organism: Secondary | ICD-10-CM | POA: Diagnosis not present

## 2021-09-30 DIAGNOSIS — I350 Nonrheumatic aortic (valve) stenosis: Secondary | ICD-10-CM | POA: Diagnosis present

## 2021-09-30 DIAGNOSIS — R4587 Impulsiveness: Secondary | ICD-10-CM | POA: Diagnosis present

## 2021-09-30 DIAGNOSIS — J45901 Unspecified asthma with (acute) exacerbation: Secondary | ICD-10-CM | POA: Diagnosis present

## 2021-09-30 DIAGNOSIS — R739 Hyperglycemia, unspecified: Secondary | ICD-10-CM | POA: Diagnosis not present

## 2021-09-30 DIAGNOSIS — Z981 Arthrodesis status: Secondary | ICD-10-CM

## 2021-09-30 DIAGNOSIS — Z9911 Dependence on respirator [ventilator] status: Secondary | ICD-10-CM

## 2021-09-30 DIAGNOSIS — R579 Shock, unspecified: Secondary | ICD-10-CM | POA: Diagnosis not present

## 2021-09-30 DIAGNOSIS — F1120 Opioid dependence, uncomplicated: Secondary | ICD-10-CM | POA: Diagnosis present

## 2021-09-30 DIAGNOSIS — J9690 Respiratory failure, unspecified, unspecified whether with hypoxia or hypercapnia: Secondary | ICD-10-CM

## 2021-09-30 DIAGNOSIS — Z7984 Long term (current) use of oral hypoglycemic drugs: Secondary | ICD-10-CM

## 2021-09-30 DIAGNOSIS — R6520 Severe sepsis without septic shock: Secondary | ICD-10-CM | POA: Diagnosis not present

## 2021-09-30 DIAGNOSIS — R1110 Vomiting, unspecified: Secondary | ICD-10-CM

## 2021-09-30 DIAGNOSIS — F4310 Post-traumatic stress disorder, unspecified: Secondary | ICD-10-CM | POA: Diagnosis present

## 2021-09-30 DIAGNOSIS — Z833 Family history of diabetes mellitus: Secondary | ICD-10-CM

## 2021-09-30 DIAGNOSIS — R011 Cardiac murmur, unspecified: Secondary | ICD-10-CM | POA: Diagnosis present

## 2021-09-30 DIAGNOSIS — R410 Disorientation, unspecified: Secondary | ICD-10-CM | POA: Diagnosis not present

## 2021-09-30 DIAGNOSIS — G8929 Other chronic pain: Secondary | ICD-10-CM | POA: Diagnosis present

## 2021-09-30 LAB — CBC WITH DIFFERENTIAL/PLATELET
Abs Immature Granulocytes: 0.04 10*3/uL (ref 0.00–0.07)
Basophils Absolute: 0 10*3/uL (ref 0.0–0.1)
Basophils Relative: 0 %
Eosinophils Absolute: 0 10*3/uL (ref 0.0–0.5)
Eosinophils Relative: 0 %
HCT: 43.8 % (ref 36.0–46.0)
Hemoglobin: 14.1 g/dL (ref 12.0–15.0)
Immature Granulocytes: 0 %
Lymphocytes Relative: 22 %
Lymphs Abs: 2.9 10*3/uL (ref 0.7–4.0)
MCH: 30.7 pg (ref 26.0–34.0)
MCHC: 32.2 g/dL (ref 30.0–36.0)
MCV: 95.4 fL (ref 80.0–100.0)
Monocytes Absolute: 0.4 10*3/uL (ref 0.1–1.0)
Monocytes Relative: 3 %
Neutro Abs: 9.4 10*3/uL — ABNORMAL HIGH (ref 1.7–7.7)
Neutrophils Relative %: 75 %
Platelets: 260 10*3/uL (ref 150–400)
RBC: 4.59 MIL/uL (ref 3.87–5.11)
RDW: 13.2 % (ref 11.5–15.5)
WBC: 12.8 10*3/uL — ABNORMAL HIGH (ref 4.0–10.5)
nRBC: 0 % (ref 0.0–0.2)

## 2021-09-30 LAB — COMPREHENSIVE METABOLIC PANEL
ALT: 20 U/L (ref 0–44)
AST: 33 U/L (ref 15–41)
Albumin: 3.5 g/dL (ref 3.5–5.0)
Alkaline Phosphatase: 59 U/L (ref 38–126)
Anion gap: 10 (ref 5–15)
BUN: 15 mg/dL (ref 6–20)
CO2: 22 mmol/L (ref 22–32)
Calcium: 8.9 mg/dL (ref 8.9–10.3)
Chloride: 101 mmol/L (ref 98–111)
Creatinine, Ser: 1.04 mg/dL — ABNORMAL HIGH (ref 0.44–1.00)
GFR, Estimated: >60 mL/min (ref >60)
Glucose, Bld: 256 mg/dL — ABNORMAL HIGH (ref 70–99)
Potassium: 3.7 mmol/L (ref 3.5–5.1)
Sodium: 133 mmol/L — ABNORMAL LOW (ref 135–145)
Total Bilirubin: 0.6 mg/dL (ref 0.3–1.2)
Total Protein: 6.7 g/dL (ref 6.5–8.1)

## 2021-09-30 LAB — ACETAMINOPHEN LEVEL: Acetaminophen (Tylenol), Serum: <10 ug/mL — ABNORMAL LOW (ref 10–30)

## 2021-09-30 LAB — I-STAT ARTERIAL BLOOD GAS, ED
Acid-base deficit: 2 mmol/L (ref 0.0–2.0)
Bicarbonate: 25.3 mmol/L (ref 20.0–28.0)
Calcium, Ion: 1.27 mmol/L (ref 1.15–1.40)
HCT: 44 % (ref 36.0–46.0)
Hemoglobin: 15 g/dL (ref 12.0–15.0)
O2 Saturation: 80 %
Patient temperature: 98.6
Potassium: 3.2 mmol/L — ABNORMAL LOW (ref 3.5–5.1)
Sodium: 137 mmol/L (ref 135–145)
TCO2: 27 mmol/L (ref 22–32)
pCO2 arterial: 51.6 mmHg — ABNORMAL HIGH (ref 32.0–48.0)
pH, Arterial: 7.299 — ABNORMAL LOW (ref 7.350–7.450)
pO2, Arterial: 50 mmHg — ABNORMAL LOW (ref 83.0–108.0)

## 2021-09-30 LAB — RESP PANEL BY RT-PCR (FLU A&B, COVID) ARPGX2
Influenza A by PCR: NEGATIVE
Influenza B by PCR: NEGATIVE
SARS Coronavirus 2 by RT PCR: NEGATIVE

## 2021-09-30 LAB — PROTIME-INR
INR: 0.9 (ref 0.8–1.2)
Prothrombin Time: 11.6 seconds (ref 11.4–15.2)

## 2021-09-30 LAB — TROPONIN I (HIGH SENSITIVITY): Troponin I (High Sensitivity): 12 ng/L (ref <18)

## 2021-09-30 LAB — LACTIC ACID, PLASMA: Lactic Acid, Venous: 3 mmol/L (ref 0.5–1.9)

## 2021-09-30 LAB — BRAIN NATRIURETIC PEPTIDE: B Natriuretic Peptide: 31.1 pg/mL (ref 0.0–100.0)

## 2021-09-30 LAB — SALICYLATE LEVEL: Salicylate Lvl: <7 mg/dL — ABNORMAL LOW (ref 7.0–30.0)

## 2021-09-30 LAB — LIPASE, BLOOD: Lipase: 36 U/L (ref 11–51)

## 2021-09-30 MED ORDER — ETOMIDATE 2 MG/ML IV SOLN
INTRAVENOUS | Status: DC | PRN
  Administered 2021-09-30: 20 mg via INTRAVENOUS

## 2021-09-30 MED ORDER — ASPIRIN 300 MG RE SUPP
300.0000 mg | RECTAL | Status: AC
  Administered 2021-09-30: 23:00:00 300 mg via RECTAL
  Filled 2021-09-30: qty 1

## 2021-09-30 MED ORDER — PROPOFOL 1000 MG/100ML IV EMUL
INTRAVENOUS | Status: DC | PRN
  Administered 2021-09-30: 30 ug/kg/min via INTRAVENOUS

## 2021-09-30 MED ORDER — NITROGLYCERIN IN D5W 200-5 MCG/ML-% IV SOLN
INTRAVENOUS | Status: AC
  Filled 2021-09-30: qty 250

## 2021-09-30 MED ORDER — NITROGLYCERIN IN D5W 200-5 MCG/ML-% IV SOLN: 0.0000 ug/min | INTRAVENOUS | Status: DC

## 2021-09-30 MED ORDER — KETAMINE HCL 50 MG/5ML IJ SOSY
PREFILLED_SYRINGE | INTRAMUSCULAR | Status: AC
  Administered 2021-09-30: 23:00:00 50 mg
  Filled 2021-09-30: qty 5

## 2021-09-30 MED ORDER — POLYETHYLENE GLYCOL 3350 17 G PO PACK: 17.0000 g | PACK | Freq: Every day | ORAL | Status: DC | PRN

## 2021-09-30 MED ORDER — ENOXAPARIN SODIUM 40 MG/0.4ML IJ SOSY
40.0000 mg | PREFILLED_SYRINGE | INTRAMUSCULAR | Status: DC
  Administered 2021-09-30 – 2021-10-13 (×14): 40 mg via SUBCUTANEOUS
  Filled 2021-09-30 (×14): qty 0.4

## 2021-09-30 MED ORDER — NITROGLYCERIN IN D5W 200-5 MCG/ML-% IV SOLN
INTRAVENOUS | Status: AC
  Administered 2021-09-30: 20:00:00 30 ug/min via INTRAVENOUS
  Filled 2021-09-30: qty 250

## 2021-09-30 MED ORDER — NOREPINEPHRINE 4 MG/250ML-% IV SOLN
0.0000 ug/min | INTRAVENOUS | Status: DC
  Administered 2021-09-30: 22:00:00 2 ug/min via INTRAVENOUS
  Filled 2021-09-30: qty 250

## 2021-09-30 MED ORDER — KETAMINE HCL 50 MG/5ML IJ SOSY
PREFILLED_SYRINGE | INTRAMUSCULAR | Status: AC
  Filled 2021-09-30: qty 5

## 2021-09-30 MED ORDER — INSULIN ASPART 100 UNIT/ML IJ SOLN
0.0000 [IU] | INTRAMUSCULAR | Status: DC
  Administered 2021-10-01: 15 [IU] via SUBCUTANEOUS
  Administered 2021-10-01: 03:00:00 11 [IU] via SUBCUTANEOUS
  Administered 2021-10-01: 07:00:00 8 [IU] via SUBCUTANEOUS

## 2021-09-30 MED ORDER — IPRATROPIUM-ALBUTEROL 0.5-2.5 (3) MG/3ML IN SOLN
3.0000 mL | Freq: Once | RESPIRATORY_TRACT | Status: AC
  Administered 2021-09-30: 21:00:00 3 mL via RESPIRATORY_TRACT
  Filled 2021-09-30: qty 3

## 2021-09-30 MED ORDER — ROCURONIUM BROMIDE 50 MG/5ML IV SOLN
INTRAVENOUS | Status: DC | PRN
  Administered 2021-09-30: 100 mg via INTRAVENOUS

## 2021-09-30 MED ORDER — ASPIRIN 81 MG PO CHEW: 324.0000 mg | CHEWABLE_TABLET | ORAL | Status: AC

## 2021-09-30 MED ORDER — DOCUSATE SODIUM 100 MG PO CAPS: 100.0000 mg | ORAL_CAPSULE | Freq: Two times a day (BID) | ORAL | Status: DC | PRN

## 2021-09-30 MED ORDER — FENTANYL CITRATE (PF) 100 MCG/2ML IJ SOLN
INTRAMUSCULAR | Status: DC | PRN
  Administered 2021-09-30: 100 ug via INTRAVENOUS

## 2021-09-30 MED ORDER — PANTOPRAZOLE SODIUM 40 MG IV SOLR
40.0000 mg | Freq: Every day | INTRAVENOUS | Status: DC
  Administered 2021-09-30 – 2021-10-03 (×4): 40 mg via INTRAVENOUS
  Filled 2021-09-30 (×4): qty 40

## 2021-09-30 MED ORDER — FENTANYL 2500MCG IN NS 250ML (10MCG/ML) PREMIX INFUSION
0.0000 ug/h | INTRAVENOUS | Status: DC
  Administered 2021-09-30: 23:00:00 50 ug/h via INTRAVENOUS
  Administered 2021-10-01: 21:00:00 400 ug/h via INTRAVENOUS
  Administered 2021-10-01: 15:00:00 300 ug/h via INTRAVENOUS
  Administered 2021-10-02 (×2): 400 ug/h via INTRAVENOUS
  Filled 2021-09-30 (×5): qty 250

## 2021-09-30 MED ORDER — PROPOFOL 1000 MG/100ML IV EMUL
5.0000 ug/kg/min | INTRAVENOUS | Status: DC
  Administered 2021-09-30: 50 ug/kg/min via INTRAVENOUS
  Administered 2021-10-01: 20:00:00 30 ug/kg/min via INTRAVENOUS
  Administered 2021-10-01 (×3): 60 ug/kg/min via INTRAVENOUS
  Administered 2021-10-01 – 2021-10-02 (×2): 30 ug/kg/min via INTRAVENOUS
  Filled 2021-09-30 (×9): qty 100

## 2021-09-30 MED ORDER — METHYLPREDNISOLONE SODIUM SUCC 125 MG IJ SOLR
125.0000 mg | Freq: Once | INTRAMUSCULAR | Status: AC
  Administered 2021-09-30: 22:00:00 125 mg via INTRAVENOUS
  Filled 2021-09-30: qty 2

## 2021-09-30 MED ORDER — PROPOFOL 1000 MG/100ML IV EMUL
INTRAVENOUS | Status: AC
  Administered 2021-09-30: 21:00:00 20 ug via INTRAVENOUS
  Filled 2021-09-30: qty 100

## 2021-09-30 NOTE — ED Notes (Signed)
159ml fluid bolus given with ketamine IV push

## 2021-09-30 NOTE — Progress Notes (Deleted)
*  Late Note* Pt tube withdrawn 2cm per CXR. Tube is currently at 22 @ lip. RT will cont to monitor.

## 2021-09-30 NOTE — ED Provider Notes (Addendum)
[place] EMERGENCY DEPARTMENT Provider Note   CSN: [id number] Arrival date & time:        History Chief Complaint  Patient presents with   Respiratory Distress    [name] is a 55 y.o. female.  HPI  This patient is a 55 year old female with a known history of diabetes, asthma, hyperlipidemia, PTSD and a history of chronic pancreatitis.  Evidently the patient does take Lantus, she takes baclofen, she takes albuterol, Singulair, Crestor, Zoloft as well as a variety of pain medications as needed.  She presents to the hospital today with severe shortness of breath, the patient's family member state that she had had some flulike symptoms earlier in the week but today had severe acute onset shortness of breath with distress.  The patient arrives on a CPAP machine with an oxygen level of 65%, she is altered, cropped over to the side, she is barely responsive to questions, level 5 caveat applies.  The patient is able to nod her head that she did use cocaine today.  Past Medical History:  Diagnosis Date   Asthma    Diabetes mellitus without complication (HCC)    GERD (gastroesophageal reflux disease)    Heart murmur    from childhood rheumatic fever, per patient   Hyperlipidemia    Pneumonia 12/14   Wears partial dentures    top and bottom partials    Patient Active Problem List   Diagnosis Date Noted   Chronic pancreatitis, unspecified pancreatitis type ([place]) 04/20/2021   Chronic midline low back pain without sciatica 05/15/2018   Uncontrolled type 2 diabetes mellitus with hyperglycemia ([place]) 05/14/2018   Encounter for long-term (current) use of insulin ([place]) 05/14/2018   PTSD (post-traumatic stress disorder) 05/14/2018   Essential hypertension, benign 05/14/2018   Hyperlipidemia 05/14/2018   Mild intermittent asthma 05/14/2018   HCAP (healthcare-associated pneumonia) 10/05/2013   Flu-like symptoms 10/05/2013   Asthma exacerbation 10/05/2013   Diabetes  mellitus ([place]) 10/05/2013   GERD (gastroesophageal reflux disease) 10/05/2013   Tobacco use 10/05/2013    Past Surgical History:  Procedure Laterality Date   ANTERIOR LAT LUMBAR FUSION Left 07/31/2013   Procedure: ANTERIOR LATERAL LUMBAR FUSION 1 LEVEL/Left sided lumbar 3-4 lateral interbody fusion with instrumentation and allograft.;  Surgeon: Sinclair Ship, MD;  Location: [facility];  Service: Orthopedics;  Laterality: Left;  Left sided lumbar 3-4 lateral interbody fusion with instrumentation and allograft.   APPENDECTOMY  30- 40 years   carpal tunnel     right arm   CARPAL TUNNEL RELEASE Right 12/15/2013   Procedure: RIGHT CARPAL TUNNEL RELEASE ENDOSCOPIC;  Surgeon: Jolyn Nap, MD;  Location: [facility];  Service: Orthopedics;  Laterality: Right;   LATERAL EPICONDYLE RELEASE Right 12/15/2013   Procedure: RIGHT ULNAR NEUROPLASTY AT ELBOW ;  Surgeon: Jolyn Nap, MD;  Location: [facility];  Service: Orthopedics;  Laterality: Right;   LUMBAR FUSION       OB History   No obstetric history on file.     Family History  Problem Relation Age of Onset   Diabetes Mother    Heart disease Mother     Social History   Tobacco Use   Smoking status: Former    Types: Cigarettes    Quit date: 08/30/2016    Years since quitting: 5.0   Smokeless tobacco: Never  Substance Use Topics   Alcohol use: No    Comment: beer everynow and then per patient   Drug  use: No    Home Medications Prior to Admission medications   Medication Sig Start Date End Date Taking? Authorizing Provider  ACCU-CHEK AVIVA PLUS test strip USE AS DIRECTED FOR 30 DAYS 04/07/18   [provider]  albuterol (PROVENTIL HFA;VENTOLIN HFA) 108 (90 Base) MCG/ACT inhaler Inhale 1-2 puffs into the lungs every 6 (six) hours as needed for wheezing or shortness of breath. 04/02/18   Langston Masker B, PA-C  baclofen (LIORESAL) 10 MG tablet Take 10 mg by mouth 3 (three) times daily as  needed for muscle spasms. 10/28/20   [provider]  Cholecalciferol (VITAMIN D) 50 MCG (2000 UT) tablet Take 2,000 Units by mouth daily.    [provider]  JANUMET 50-1000 MG tablet Take 1 tablet by mouth 2 (two) times daily. 10/03/20   [provider]  LANTUS SOLOSTAR 100 UNIT/ML Solostar Pen Inject 40 Units into the skin at bedtime. Patient taking differently: Inject 45 Units into the skin at bedtime. 01/02/19   Jacelyn Pi, Lilia Argue, MD  lisinopril-hydrochlorothiazide (PRINZIDE,ZESTORETIC) 20-12.5 MG tablet Take 1 tablet by mouth daily. 01/02/19   Jacelyn Pi, Lilia Argue, MD  methocarbamol (ROBAXIN) 500 MG tablet Take 1 tablet (500 mg total) by mouth 4 (four) times daily. 01/02/19   Jacelyn Pi, Lilia Argue, MD  montelukast (SINGULAIR) 10 MG tablet Take 1 tablet (10 mg total) by mouth every evening. 01/02/19   Daleen Squibb, MD  oxyCODONE-acetaminophen (PERCOCET) 10-325 MG tablet Take 1 tablet by mouth 5 (five) times daily. 10/28/20   [provider]  pantoprazole (PROTONIX) 20 MG tablet Take 1 tablet (20 mg total) by mouth daily. 10/29/20   Rayna Sexton, PA-C  RELION PEN NEEDLE 31G/8MM 31G X 8 MM MISC Inject 1 each into the skin daily. use as directed 10/01/18   Jacelyn Pi, Lilia Argue, MD  rosuvastatin (CRESTOR) 10 MG tablet Take 1 tablet (10 mg total) by mouth daily. 01/02/19   Daleen Squibb, MD  rosuvastatin (CRESTOR) 40 MG tablet Take 40 mg by mouth every evening. 09/28/20   [provider]  sertraline (ZOLOFT) 25 MG tablet Take 1 tablet (25 mg total) by mouth daily. 01/02/19   Daleen Squibb, MD  VASCEPA 1 g capsule Take 2 g by mouth 2 (two) times daily. 10/11/20   [provider]    Allergies    Patient has no known allergies.  Review of Systems   Review of Systems  Unable to perform ROS: Severe respiratory distress   Physical Exam Updated Vital Signs BP 104/68    Temp 100.2 F (37.9 C)    Resp (!) 25    Ht 1.676 m  (_0 )    LMP 03/07/2012    SpO2 (!) 89%    BMI 27.08 kg/m   Physical Exam Vitals and nursing note reviewed.  Constitutional:      General: She is in acute distress.     Appearance: She is well-developed. She is ill-appearing, toxic-appearing and diaphoretic.  HENT:     Head: Normocephalic and atraumatic.     Nose: Nose normal.     Mouth/Throat:     Mouth: Mucous membranes are moist.     Pharynx: No oropharyngeal exudate.  Eyes:     General: No scleral icterus.       Right eye: No discharge.        Left eye: No discharge.     Conjunctiva/sclera: Conjunctivae normal.     Pupils: Pupils are  equal, round, and reactive to light.  Neck:     Thyroid: No thyromegaly.     Vascular: No JVD.  Cardiovascular:     Rate and Rhythm: Regular rhythm. Tachycardia present.     Heart sounds: Normal heart sounds. No murmur heard.   No friction rub. No gallop.  Pulmonary:     Effort: Respiratory distress present.     Breath sounds: Wheezing, rhonchi and rales present.  Abdominal:     General: Bowel sounds are normal. There is no distension.     Palpations: Abdomen is soft. There is no mass.     Tenderness: There is no abdominal tenderness.  Musculoskeletal:        General: No tenderness. Normal range of motion.     Cervical back: Normal range of motion and neck supple.     Right lower leg: No edema.     Left lower leg: No edema.  Lymphadenopathy:     Cervical: No cervical adenopathy.  Skin:    General: Skin is warm.     Findings: No erythema or rash.  Neurological:     Comments: This patient is obtunded and difficult to arouse, she is able to mumble occasional words stating "I do want to die" over and over.  She does endorse using cocaine, she is able to move all 4 extremities but severely diffusely weak, unable to keep her head up    ED Results / Procedures / Treatments   Labs (all labs ordered are listed, but only abnormal results are displayed) Labs Reviewed  ACETAMINOPHEN LEVEL -  Abnormal; Notable for the following components:      Result Value   Acetaminophen (Tylenol), Serum <10 (*)    All other components within normal limits  SALICYLATE LEVEL - Abnormal; Notable for the following components:   Salicylate Lvl <8.1 (*)    All other components within normal limits  COMPREHENSIVE METABOLIC PANEL - Abnormal; Notable for the following components:   Sodium 133 (*)    Glucose, Bld 256 (*)    Creatinine, Ser 1.04 (*)    All other components within normal limits  LACTIC ACID, PLASMA - Abnormal; Notable for the following components:   Lactic Acid, Venous 3.0 (*)    All other components within normal limits  CBC WITH DIFFERENTIAL/PLATELET - Abnormal; Notable for the following components:   WBC 12.8 (*)    Neutro Abs 9.4 (*)    All other components within normal limits  I-STAT ARTERIAL BLOOD GAS, ED - Abnormal; Notable for the following components:   pH, Arterial 7.299 (*)    pCO2 arterial 51.6 (*)    pO2, Arterial 50 (*)    Potassium 3.2 (*)    All other components within normal limits  URINE CULTURE  RESP PANEL BY RT-PCR (FLU A&B, COVID) ARPGX2  LIPASE, BLOOD  PROTIME-INR  BRAIN NATRIURETIC PEPTIDE  LACTIC ACID, PLASMA  URINALYSIS, ROUTINE W REFLEX MICROSCOPIC  RAPID URINE DRUG SCREEN, HOSP PERFORMED  TROPONIN I (HIGH SENSITIVITY)    EKG EKG Interpretation  Date/Time:  Friday September 30 2021 20:05:29 EST Ventricular Rate:  140 PR Interval:  128 QRS Duration: 81 QT Interval:  303 QTC Calculation: 463 R Axis:   74 Text Interpretation: Sinus tachycardia Borderline low voltage, extremity leads Confirmed by Noemi Chapel [phone]) on 09/30/2021 8:23:22 PM  Radiology DG Chest Portable 1 View  Addendum Date: 09/30/2021   ADDENDUM REPORT: 09/30/2021 20:35 ADDENDUM: These results were called by telephone at the time of interpretation  on 09/30/2021 at 8:31 pm to provider [facility] , who verbally acknowledged these results. Electronically Signed   By:  Iven Finn M.D.   On: 09/30/2021 20:35   Result Date: 09/30/2021 CLINICAL DATA:  ETT placement EXAM: PORTABLE CHEST 1 VIEW COMPARISON:  Chest x-ray 09/11/2018 FINDINGS: Endotracheal tube terminates 1 cm above the carina. Enteric tube courses below the hemidiaphragm with tip and side port overlying the gastric lumen. Tube is noted to be coiled within the gastric lumen. The heart and mediastinal contours are unchanged. Question patchy interstitial and airspace opacities. No focal consolidation. No pulmonary edema. No pleural effusion. No pneumothorax. No acute osseous abnormality. IMPRESSION: 1. Endotracheal tube terminates 1 cm above the carina. Recommend retraction by 2 cm. 2. Enteric tube within the gastric lumen coiled. Consider retracting by 5 cm. 3. Question patchy interstitial and airspace opacities. Findings may represent infection/inflammation versus pulmonary edema. Electronically Signed: By: Iven Finn M.D. On: 09/30/2021 20:29    Procedures .Critical Care Performed by: Noemi Chapel, MD Authorized by: Noemi Chapel, MD   Critical care provider statement:    Critical care time (minutes):  30   Critical care time was exclusive of:  Separately billable procedures and treating other patients and teaching time   Critical care was necessary to treat or prevent imminent or life-threatening deterioration of the following conditions:  Respiratory failure and cardiac failure   Critical care was time spent personally by me on the following activities:  Development of treatment plan with patient or surrogate, discussions with consultants, evaluation of patient's response to treatment, examination of patient, ordering and review of laboratory studies, ordering and review of radiographic studies, ordering and performing treatments and interventions, pulse oximetry, re-evaluation of patient's condition and review of old charts Comments:       Procedure Name: Intubation Date/Time: 09/30/2021  8:24 PM Performed by: Noemi Chapel, MD Pre-anesthesia Checklist: Patient identified, Emergency Drugs available, Suction available and Patient being monitored Oxygen Delivery Method: Ambu bag Preoxygenation: Pre-oxygenation with 100% oxygen Induction Type: Rapid sequence Ventilation: Mask ventilation with difficulty and Two handed mask ventilation required Laryngoscope Size: Mac and 4 Grade View: Grade III Tube size: 7.0 mm Number of attempts: 2 Airway Equipment and Method: Stylet and Bougie stylet Placement Confirmation: ETT inserted through vocal cords under direct vision, Positive ETCO2, CO2 detector and Breath sounds checked- equal and bilateral Secured at: 24 cm Tube secured with: ETT holder Dental Injury: Teeth and Oropharynx as per pre-operative assessment  Difficulty Due To: Difficulty was anticipated Future Recommendations: Recommend- induction with short-acting agent, and alternative techniques readily available Comments: The patient had very narrow and small vocal cords, there was some spasm requiring bougie assisted endotracheal tube placement.     Linus Salmons Line  Date/Time: 09/30/2021 10:41 PM Performed by: Noemi Chapel, MD Authorized by: Noemi Chapel, MD   Consent:    Consent obtained:  Emergent situation Universal protocol:    Test results available: yes     Imaging studies available: yes     Required blood products, implants, devices, and special equipment available: yes     Site/side marked: yes     Immediately prior to procedure, a time out was called: yes     Patient identity confirmed:  Arm band Pre-procedure details:    Indication(s): central venous access, hemodynamic monitoring and insufficient peripheral access     Hand hygiene: Hand hygiene performed prior to insertion     Sterile barrier technique: All elements of maximal sterile technique followed  Skin preparation:  Chlorhexidine   Skin preparation agent: Skin preparation agent completely  dried prior to procedure   Anesthesia:    Anesthesia method:  None Procedure details:    Location:  R femoral   Site selection rationale:  Short neck, clean groin   Patient position:  Supine   Procedural supplies:  Triple lumen   Catheter size:  7 Fr   Landmarks identified: yes     Ultrasound guidance: no     Number of attempts:  1   Successful placement: yes   Post-procedure details:    Post-procedure:  Dressing applied and line sutured   Assessment:  Blood return through all ports and free fluid flow   Procedure completion:  Tolerated well, no immediate complications Comments:         Medications Ordered in ED Medications  nitroGLYCERIN 50 mg in dextrose 5 % 250 mL (0.2 mg/mL) infusion (30 mcg/min Intravenous New Bag/Given 09/30/21 2022)  etomidate (AMIDATE) injection (20 mg Intravenous Given 09/30/21 2000)  rocuronium (ZEMURON) injection (100 mg Intravenous Given 09/30/21 2000)  fentaNYL (SUBLIMAZE) injection (100 mcg Intravenous Given 09/30/21 2005)  propofol (DIPRIVAN) 1000 MG/100ML infusion (20 mcg Intravenous New Bag/Given 09/30/21 2030)  ipratropium-albuterol (DUONEB) 0.5-2.5 (3) MG/3ML nebulizer solution 3 mL (3 mLs Nebulization Given 09/30/21 2102)  methylPREDNISolone sodium succinate (SOLU-MEDROL) 125 mg/2 mL injection 125 mg (125 mg Intravenous Given 09/30/21 2132)    ED Course  I have reviewed the triage vital signs and the nursing notes.  Pertinent labs & imaging results that were available during my care of the patient were reviewed by me and considered in my medical decision making (see chart for details).    MDM Rules/Calculators/A&P                          This patient presents to the ED for concern of altered mental status and severe shortness of breath, this involves an extensive number of treatment options, and is a complaint that carries with it a high risk of complications and morbidity.  The differential diagnosis includes cocaine induced pulmonary  edema, acute myocardial infarction, pulmonary embolism, COVID-19, aspiration pneumonia, multifocal pneumonia, pneumothorax   Additional history obtained:  Additional history obtained from paramedics who state that they were able to start an IV and placed the patient on positive airway pressure ventilation however had persistent and worsening condition External records from outside source obtained and reviewed including prior evaluations in the patient's office, and in fact had been seen by oncology due to a persistent leukocytosis which was thought to be 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 Family members eventually got to the emergency department and the husband of 31 years states that today they got high, they both use cocaine and shortly thereafter she started to get short of breath and shortly after that very sweaty at that point he called the paramedics.   Lab Tests:  I Ordered, reviewed, and interpreted labs.  The pertinent results include: Laboratory work-up which was quite extensive including liver function, renal function, blood counts, metabolic panel, lactic acid, troponin, drug screen.  The results show that the patient is slightly acidotic, undetectable acetaminophen or salicylate levels. Troponin is 12, lipase is 36, metabolic panel shows some hyperglycemia with a glucose of 256 but preserved renal function with a creatinine of 1.04.  Sodium of 133, white blood cell count of 12,800, none of this is very specific  to the patient's specific disease process.   Imaging Studies ordered:  I ordered imaging studies including portable chest x-ray which shows diffuse pulmonary edema.  Due to the endotracheal tube being at the carina it was withdrawn approximately 2 cm I independently visualized and interpreted imaging which showed diffuse pulmonary edema I agree with the radiologist interpretation   Cardiac Monitoring:  The  patient was maintained on a cardiac monitor.  I personally viewed and interpreted the cardiac monitored which showed an underlying rhythm of: Sinus tachycardia   Medicines ordered and prescription drug management:  I ordered medication including etomidate, rocuronium and fentanyl for sedation to facilitate intubation, propofol drip as well as a nitroglycerin drip were started as the patient was severely hypertensive Reevaluation of the patient after these medicines showed that the patient improved I have reviewed the patients home medicines and have made adjustments as needed   Critical Interventions:  Intubation due to severe altered mental status and persistent hypoxia despite positive airway pressure Sedation to facilitate intubation and ongoing need for sedation Nitroglycerin to treat for what appears to be acute pulmonary edema from severe hypertension possibly induced by cocaine The patient became persistently hypotensive, required more sedation and required both Levophed as a vasopressor as well as a central venous catheter which I placed, see attached note   Consultations Obtained:  I requested consultation with the ICU, intensivist team Janett Billow),  and discussed lab and imaging findings as well as pertinent plan - they recommend: Admission to intensive care unit   Reevaluation:  After the interventions noted above, I reevaluated the patient and found that they have :improved, however the patient is in critical condition   Dispostion:  After consideration of the diagnostic results and the patients response to treatment feel that the patent would benefit from  [place] to ICU and high level of care      Final Clinical Impression(s) / ED Diagnoses Final diagnoses:  Acute pulmonary edema ([place])  Acute respiratory failure with hypoxia ([place])  Cocaine abuse ([place])     Noemi Chapel, MD 09/30/21 2133    Noemi Chapel, MD 09/30/21 2242

## 2021-09-30 NOTE — ED Notes (Signed)
Pt resting comfortably at this time with propofol at 41mcg/kg/min. MAP 70.

## 2021-09-30 NOTE — Progress Notes (Signed)
Bite block in place due to pt biting ETT. Tube still at 22@ lip. RT will cont to monitor.

## 2021-09-30 NOTE — ED Triage Notes (Signed)
Pt bib [facility] for respiratory distress. EMS said pt did cocaine and began having worsen respiratory symptoms as the day went on. Pt came in lethargic, very little respiratory drive, one word responses.

## 2021-09-30 NOTE — ED Notes (Signed)
Pt is currently sedated well.

## 2021-09-30 NOTE — H&P (Signed)
NAME:  [name], MRN:  [id number], DOB:  [birth year], LOS: 0 ADMISSION DATE:  09/30/2021, CONSULTATION DATE:  09/30/21 REFERRING MD:  EDP, CHIEF COMPLAINT:  acute hypoxic resp failure   History of Present Illness:  55 yo black female with pmh asthma, dm2, hyperlipidemia, PTSD/anxiety and chronic pancreatitis presented via EMS on cpap machine with sats in the mid 60's. Pt is intubated and sedated so ROS and complete history are unobtainable at this time. All history is obtained from ED and chart review.   Per report pt was at home partaking in cocaine with her husband when she began having sudden and severe sob. She was altered, unable to hold herself up in bed, minimally responsive. She had minimal air movement while on cpap and decision was made to emergently intubate pt. CXR revealed pulmonary edema. BP was noted to be extremely elevated >409 systolic. She was started on nitro infusion.   CCM was consulted for admission 2/2 pt's intubated status.   Pertinent  Medical History  DM2 Asthma Hyperlipidemia PTSD, anxiety Chronic pancreatitis  Significant Hospital Events: Including procedures, antibiotic start and stop dates in addition to other pertinent events   12/23: intubated  Interim History / Subjective:  As above  Objective   Blood pressure 104/68, temperature 100.2 F (37.9 C), resp. rate (!) 25, height 5\' 6"  (8.119 m), last menstrual period 03/07/2012, SpO2 (!) 89 %.    Vent Mode: PRVC FiO2 (%):  [100 %] 100 % Set Rate:  [20 bmp-25 bmp] 25 bmp Vt Set:  [470 mL] 470 mL PEEP:  [14 cmH20] 14 cmH20 Plateau Pressure:  [34 cmH20-35 cmH20] 35 cmH20  No intake or output data in the 24 hours ending 09/30/21 2136 There were no vitals filed for this visit.  Examination: General: sedated and intubated.  HENT: ncat Lungs: diffuse rales Cardiovascular: rrr no murmur gallop rubs Abdomen: soft nt,nd bs _ Extremities: no c/c/e Neuro: sedated GU: deferred  Resolved Hospital  Problem list     Assessment & Plan:  Acute hypoxic resp failure, poa:  -sats 60's on presentation -titrate vent -escalated peep 2/2 pulm edema noted -sat/sbt when settings amendable -flu/covid swab pending Acute pulmonary edema:  -nitro gtt -echo pending -diuresis as tolerated -in setting of cocaine use -interestingly bnp is flat Asthma:  -prn bronchodilators  HTN emergency:  -2/2 cocaine use -resulting in pulmonary edema -nitro gtt infusing at this time.  -avoid bb  Substance abuse:  -known h/o cocaine use and endorsed current use -encourage cessation -uds pending -avoid bb in setting of cocaine use  Lactic acidosis:  -trend  T2dm:  -a1c pending -ssi  PSTD:  -resume home meds when able to take po     Best Practice (right click and "Reselect all SmartList Selections" daily)   Diet/type: NPO DVT prophylaxis: LMWH GI prophylaxis: PPI Lines: N/A Foley:  Yes, and it is still needed Code Status:  full code Last date of multidisciplinary goals of care discussion [pending morning when pt's husband is no longer high on cocaine as well. ]  Labs   CBC: Recent Labs  Lab 09/30/21 2027 09/30/21 2036  WBC 12.8*  --   NEUTROABS 9.4*  --   HGB 14.1 15.0  HCT 43.8 44.0  MCV 95.4  --   PLT 260  --     Basic Metabolic Panel: Recent Labs  Lab 09/30/21 2027 09/30/21 2036  NA 133* 137  K 3.7 3.2*  CL 101  --   CO2 22  --  GLUCOSE 256*  --   BUN 15  --   CREATININE 1.04*  --   CALCIUM 8.9  --    GFR: CrCl cannot be calculated (Unknown ideal weight.). Recent Labs  Lab 09/30/21 2027 09/30/21 2032  WBC 12.8*  --   LATICACIDVEN  --  3.0*    Liver Function Tests: Recent Labs  Lab 09/30/21 2027  AST 33  ALT 20  ALKPHOS 59  BILITOT 0.6  PROT 6.7  ALBUMIN 3.5   Recent Labs  Lab 09/30/21 2027  LIPASE 36   No results for input(s): AMMONIA in the last 168 hours.  ABG    Component Value Date/Time   PHART 7.299 (L) 09/30/2021 2036    PCO2ART 51.6 (H) 09/30/2021 2036   PO2ART 50 (L) 09/30/2021 2036   HCO3 25.3 09/30/2021 2036   TCO2 27 09/30/2021 2036   ACIDBASEDEF 2.0 09/30/2021 2036   O2SAT 80.0 09/30/2021 2036     Coagulation Profile: Recent Labs  Lab 09/30/21 2027  INR 0.9    Cardiac Enzymes: No results for input(s): CKTOTAL, CKMB, CKMBINDEX, TROPONINI in the last 168 hours.  HbA1C: Hemoglobin A1C  Date/Time Value Ref Range Status  05/14/2018 03:35 PM 8.8 (A) 4.0 - 5.6 % Final   Hgb A1c MFr Bld  Date/Time Value Ref Range Status  09/11/2018 03:57 PM 7.6 (H) 4.8 - 5.6 % Final    Comment:             Prediabetes: 5.7 - 6.4          Diabetes: >6.4          Glycemic control for adults with diabetes: <7.0     CBG: No results for input(s): GLUCAP in the last 168 hours.  Review of Systems:   Unobtainable 2/2 intubated status  Past Medical History:  She,  has a past medical history of Asthma, Diabetes mellitus without complication ([place]), GERD (gastroesophageal reflux disease), Heart murmur, Hyperlipidemia, Pneumonia (12/14), and Wears partial dentures.   Surgical History:   Past Surgical History:  Procedure Laterality Date   ANTERIOR LAT LUMBAR FUSION Left 07/31/2013   Procedure: ANTERIOR LATERAL LUMBAR FUSION 1 LEVEL/Left sided lumbar 3-4 lateral interbody fusion with instrumentation and allograft.;  Surgeon: Sinclair Ship, MD;  Location: [place];  Service: Orthopedics;  Laterality: Left;  Left sided lumbar 3-4 lateral interbody fusion with instrumentation and allograft.   APPENDECTOMY  30- 40 years   carpal tunnel     right arm   CARPAL TUNNEL RELEASE Right 12/15/2013   Procedure: RIGHT CARPAL TUNNEL RELEASE ENDOSCOPIC;  Surgeon: Jolyn Nap, MD;  Location: [place];  Service: Orthopedics;  Laterality: Right;   LATERAL EPICONDYLE RELEASE Right 12/15/2013   Procedure: RIGHT ULNAR NEUROPLASTY AT ELBOW ;  Surgeon: Jolyn Nap, MD;  Location: [place];   Service: Orthopedics;  Laterality: Right;   LUMBAR FUSION       Social History:   reports that she quit smoking about 5 years ago. She has never used smokeless tobacco. She reports that she does not drink alcohol and does not use drugs.   Family History:  Her family history includes Diabetes in her mother; Heart disease in her mother.   Allergies No Known Allergies   Home Medications  Prior to Admission medications   Medication Sig Start Date End Date Taking? Authorizing Provider  ACCU-CHEK AVIVA PLUS test strip USE AS DIRECTED FOR 30 DAYS 04/07/18   [provider]  albuterol (  PROVENTIL HFA;VENTOLIN HFA) 108 (90 Base) MCG/ACT inhaler Inhale 1-2 puffs into the lungs every 6 (six) hours as needed for wheezing or shortness of breath. 04/02/18   Langston Masker B, PA-C  baclofen (LIORESAL) 10 MG tablet Take 10 mg by mouth 3 (three) times daily as needed for muscle spasms. 10/28/20   [provider]  Cholecalciferol (VITAMIN D) 50 MCG (2000 UT) tablet Take 2,000 Units by mouth daily.    [provider]  JANUMET 50-1000 MG tablet Take 1 tablet by mouth 2 (two) times daily. 10/03/20   [provider]  LANTUS SOLOSTAR 100 UNIT/ML Solostar Pen Inject 40 Units into the skin at bedtime. Patient taking differently: Inject 45 Units into the skin at bedtime. 01/02/19   Jacelyn Pi, Lilia Argue, MD  lisinopril-hydrochlorothiazide (PRINZIDE,ZESTORETIC) 20-12.5 MG tablet Take 1 tablet by mouth daily. 01/02/19   Jacelyn Pi, Lilia Argue, MD  methocarbamol (ROBAXIN) 500 MG tablet Take 1 tablet (500 mg total) by mouth 4 (four) times daily. 01/02/19   Jacelyn Pi, Lilia Argue, MD  montelukast (SINGULAIR) 10 MG tablet Take 1 tablet (10 mg total) by mouth every evening. 01/02/19   Daleen Squibb, MD  oxyCODONE-acetaminophen (PERCOCET) 10-325 MG tablet Take 1 tablet by mouth 5 (five) times daily. 10/28/20   [provider]  pantoprazole (PROTONIX) 20 MG tablet Take 1 tablet (20  mg total) by mouth daily. 10/29/20   Rayna Sexton, PA-C  RELION PEN NEEDLE 31G/8MM 31G X 8 MM MISC Inject 1 each into the skin daily. use as directed 10/01/18   Jacelyn Pi, Lilia Argue, MD  rosuvastatin (CRESTOR) 10 MG tablet Take 1 tablet (10 mg total) by mouth daily. 01/02/19   Daleen Squibb, MD  rosuvastatin (CRESTOR) 40 MG tablet Take 40 mg by mouth every evening. 09/28/20   [provider]  sertraline (ZOLOFT) 25 MG tablet Take 1 tablet (25 mg total) by mouth daily. 01/02/19   Daleen Squibb, MD  VASCEPA 1 g capsule Take 2 g by mouth 2 (two) times daily. 10/11/20   [provider]    Critical care time: The patient is critically ill with multiple organ systems failure and requires high complexity decision making for assessment and support, frequent evaluation and titration of therapies, application of advanced monitoring technologies and extensive interpretation of multiple databases.  Critical care time 42 mins. This represents my time independent of the NPs time taking care of the pt. This is excluding procedures.    Audria Nine DO [facility] 09/30/2021, 9:36 PM See Amion for pager If no response to pager, please call [phone] until 1900 After 1900 please call [facility] [phone]

## 2021-09-30 NOTE — ED Notes (Signed)
Pt starting to move with purpose- propofol restarted.

## 2021-09-30 NOTE — Progress Notes (Signed)
*  Late Note* Per CXR tube pulled back 2 cm. Tube is currently at 22 @ lip. RT will cont to monitor.

## 2021-09-30 NOTE — ED Notes (Signed)
Pt needing 52mcg bolus x 3 to sedate. Drip at 40 mcg.

## 2021-09-30 NOTE — ED Notes (Signed)
HUSBANDHarriette Ohara [phone]

## 2021-09-30 NOTE — ED Notes (Signed)
Prop started at 32mcg/kg/min at 2007 per verbal order Sabra Heck MD. Rate adjustment of prop to 60mcg/kg/min per verbal order Sabra Heck MD.

## 2021-09-30 NOTE — ED Notes (Signed)
Pt rousing with purposefully movement and yanking tube. Eyes opened. Mittens applied. Pt given 10 mcg bolus x 4 over span of 10 mins. Rate increased to 20 then 20 mcg/min. 250 ml fluid bolus given. RN at bedside monitoring pressures and sedation.

## 2021-10-01 ENCOUNTER — Inpatient Hospital Stay (HOSPITAL_COMMUNITY): Payer: Medicare HMO

## 2021-10-01 DIAGNOSIS — F1410 Cocaine abuse, uncomplicated: Secondary | ICD-10-CM | POA: Diagnosis not present

## 2021-10-01 DIAGNOSIS — R579 Shock, unspecified: Secondary | ICD-10-CM

## 2021-10-01 DIAGNOSIS — J9601 Acute respiratory failure with hypoxia: Secondary | ICD-10-CM

## 2021-10-01 DIAGNOSIS — J810 Acute pulmonary edema: Secondary | ICD-10-CM | POA: Diagnosis not present

## 2021-10-01 LAB — BASIC METABOLIC PANEL
Anion gap: 10 (ref 5–15)
BUN: 18 mg/dL (ref 6–20)
CO2: 24 mmol/L (ref 22–32)
Calcium: 8.7 mg/dL — ABNORMAL LOW (ref 8.9–10.3)
Chloride: 97 mmol/L — ABNORMAL LOW (ref 98–111)
Creatinine, Ser: 1.41 mg/dL — ABNORMAL HIGH (ref 0.44–1.00)
GFR, Estimated: 44 mL/min — ABNORMAL LOW (ref >60)
Glucose, Bld: 340 mg/dL — ABNORMAL HIGH (ref 70–99)
Potassium: 4.3 mmol/L (ref 3.5–5.1)
Sodium: 131 mmol/L — ABNORMAL LOW (ref 135–145)

## 2021-10-01 LAB — I-STAT ARTERIAL BLOOD GAS, ED
Acid-base deficit: 1 mmol/L (ref 0.0–2.0)
Bicarbonate: 25 mmol/L (ref 20.0–28.0)
Calcium, Ion: 1.21 mmol/L (ref 1.15–1.40)
HCT: 42 % (ref 36.0–46.0)
Hemoglobin: 14.3 g/dL (ref 12.0–15.0)
O2 Saturation: 97 %
Patient temperature: 100
Potassium: 4.2 mmol/L (ref 3.5–5.1)
Sodium: 133 mmol/L — ABNORMAL LOW (ref 135–145)
TCO2: 26 mmol/L (ref 22–32)
pCO2 arterial: 48.4 mmHg — ABNORMAL HIGH (ref 32.0–48.0)
pH, Arterial: 7.324 — ABNORMAL LOW (ref 7.350–7.450)
pO2, Arterial: 101 mmHg (ref 83.0–108.0)

## 2021-10-01 LAB — CBC
HCT: 40.1 % (ref 36.0–46.0)
Hemoglobin: 13.3 g/dL (ref 12.0–15.0)
MCH: 31.3 pg (ref 26.0–34.0)
MCHC: 33.2 g/dL (ref 30.0–36.0)
MCV: 94.4 fL (ref 80.0–100.0)
Platelets: 305 10*3/uL (ref 150–400)
RBC: 4.25 MIL/uL (ref 3.87–5.11)
RDW: 13.2 % (ref 11.5–15.5)
WBC: 24.3 10*3/uL — ABNORMAL HIGH (ref 4.0–10.5)
nRBC: 0 % (ref 0.0–0.2)

## 2021-10-01 LAB — URINALYSIS, MICROSCOPIC (REFLEX): RBC / HPF: >50 RBC/hpf (ref 0–5)

## 2021-10-01 LAB — POCT I-STAT 7, (LYTES, BLD GAS, ICA,H+H)
Acid-base deficit: 3 mmol/L — ABNORMAL HIGH (ref 0.0–2.0)
Bicarbonate: 25.2 mmol/L (ref 20.0–28.0)
Calcium, Ion: 1.26 mmol/L (ref 1.15–1.40)
HCT: 41 % (ref 36.0–46.0)
Hemoglobin: 13.9 g/dL (ref 12.0–15.0)
O2 Saturation: 91 %
Patient temperature: 98.6
Potassium: 4.4 mmol/L (ref 3.5–5.1)
Sodium: 134 mmol/L — ABNORMAL LOW (ref 135–145)
TCO2: 27 mmol/L (ref 22–32)
pCO2 arterial: 57.5 mmHg — ABNORMAL HIGH (ref 32.0–48.0)
pH, Arterial: 7.25 — ABNORMAL LOW (ref 7.350–7.450)
pO2, Arterial: 72 mmHg — ABNORMAL LOW (ref 83.0–108.0)

## 2021-10-01 LAB — RAPID URINE DRUG SCREEN, HOSP PERFORMED
Amphetamines: NOT DETECTED
Barbiturates: NOT DETECTED
Benzodiazepines: NOT DETECTED
Cocaine: POSITIVE — AB
Opiates: NOT DETECTED
Tetrahydrocannabinol: NOT DETECTED

## 2021-10-01 LAB — URINALYSIS, ROUTINE W REFLEX MICROSCOPIC
Bilirubin Urine: NEGATIVE
Glucose, UA: NEGATIVE mg/dL
Ketones, ur: NEGATIVE mg/dL
Nitrite: NEGATIVE
Protein, ur: 30 mg/dL — AB
Specific Gravity, Urine: 1.02 (ref 1.005–1.030)
pH: 5.5 (ref 5.0–8.0)

## 2021-10-01 LAB — ECHOCARDIOGRAM COMPLETE
AR max vel: 1.69 cm2
AV Area VTI: 1.76 cm2
AV Area mean vel: 1.93 cm2
AV Mean grad: 9 mmHg
AV Peak grad: 19 mmHg
Ao pk vel: 2.18 m/s
Area-P 1/2: 2.93 cm2
Height: 66 in
S' Lateral: 2.5 cm
Weight: 2624.36 oz

## 2021-10-01 LAB — GLUCOSE, CAPILLARY
Glucose-Capillary: 193 mg/dL — ABNORMAL HIGH (ref 70–99)
Glucose-Capillary: 208 mg/dL — ABNORMAL HIGH (ref 70–99)
Glucose-Capillary: 257 mg/dL — ABNORMAL HIGH (ref 70–99)
Glucose-Capillary: 261 mg/dL — ABNORMAL HIGH (ref 70–99)
Glucose-Capillary: 295 mg/dL — ABNORMAL HIGH (ref 70–99)
Glucose-Capillary: 306 mg/dL — ABNORMAL HIGH (ref 70–99)

## 2021-10-01 LAB — MAGNESIUM
Magnesium: 1.6 mg/dL — ABNORMAL LOW (ref 1.7–2.4)
Magnesium: 1.9 mg/dL (ref 1.7–2.4)
Magnesium: 1.9 mg/dL (ref 1.7–2.4)

## 2021-10-01 LAB — TRIGLYCERIDES: Triglycerides: 344 mg/dL — ABNORMAL HIGH (ref <150)

## 2021-10-01 LAB — MRSA NEXT GEN BY PCR, NASAL: MRSA by PCR Next Gen: NOT DETECTED

## 2021-10-01 LAB — HIV ANTIBODY (ROUTINE TESTING W REFLEX): HIV Screen 4th Generation wRfx: NONREACTIVE

## 2021-10-01 LAB — PHOSPHORUS
Phosphorus: 3.4 mg/dL (ref 2.5–4.6)
Phosphorus: 4.8 mg/dL — ABNORMAL HIGH (ref 2.5–4.6)
Phosphorus: 5.8 mg/dL — ABNORMAL HIGH (ref 2.5–4.6)

## 2021-10-01 LAB — PROCALCITONIN: Procalcitonin: 59.18 ng/mL

## 2021-10-01 LAB — HEMOGLOBIN A1C
Hgb A1c MFr Bld: 9.1 % — ABNORMAL HIGH (ref 4.8–5.6)
Mean Plasma Glucose: 214.47 mg/dL

## 2021-10-01 LAB — CBG MONITORING, ED: Glucose-Capillary: 373 mg/dL — ABNORMAL HIGH (ref 70–99)

## 2021-10-01 MED ORDER — FUROSEMIDE 10 MG/ML IJ SOLN
80.0000 mg | Freq: Once | INTRAMUSCULAR | Status: AC
  Administered 2021-10-01: 09:00:00 80 mg via INTRAVENOUS
  Filled 2021-10-01: qty 8

## 2021-10-01 MED ORDER — OXYCODONE HCL 5 MG/5ML PO SOLN
5.0000 mg | Freq: Four times a day (QID) | ORAL | Status: DC
  Administered 2021-10-01 – 2021-10-07 (×23): 5 mg
  Filled 2021-10-01 (×23): qty 5

## 2021-10-01 MED ORDER — KETAMINE BOLUS VIA INFUSION
0.1000 mg/kg | Freq: Once | INTRAVENOUS | Status: AC
  Administered 2021-10-01: 10:00:00 7.44 mg via INTRAVENOUS
  Filled 2021-10-01: qty 10

## 2021-10-01 MED ORDER — ROSUVASTATIN CALCIUM 20 MG PO TABS
40.0000 mg | ORAL_TABLET | Freq: Every day | ORAL | Status: DC
  Administered 2021-10-01 – 2021-10-25 (×25): 40 mg
  Filled 2021-10-01 (×26): qty 2

## 2021-10-01 MED ORDER — DOCUSATE SODIUM 50 MG/5ML PO LIQD
100.0000 mg | Freq: Two times a day (BID) | ORAL | Status: DC
  Administered 2021-10-01 (×2): 100 mg via ORAL
  Filled 2021-10-01 (×2): qty 10

## 2021-10-01 MED ORDER — INSULIN GLARGINE-YFGN 100 UNIT/ML ~~LOC~~ SOLN
15.0000 [IU] | Freq: Two times a day (BID) | SUBCUTANEOUS | Status: DC
  Administered 2021-10-01 (×2): 15 [IU] via SUBCUTANEOUS
  Filled 2021-10-01 (×4): qty 0.15

## 2021-10-01 MED ORDER — SODIUM CHLORIDE 0.9 % IV SOLN
0.7500 mg/kg/h | INTRAVENOUS | Status: DC
  Administered 2021-10-01: 18:00:00 0.75 mg/kg/h via INTRAVENOUS
  Administered 2021-10-01: 10:00:00 0.5 mg/kg/h via INTRAVENOUS
  Administered 2021-10-02 (×2): 0.75 mg/kg/h via INTRAVENOUS
  Filled 2021-10-01 (×4): qty 5

## 2021-10-01 MED ORDER — SODIUM CHLORIDE 0.9 % IV SOLN
250.0000 mL | INTRAVENOUS | Status: DC
  Administered 2021-10-05 – 2021-10-09 (×2): 250 mL via INTRAVENOUS

## 2021-10-01 MED ORDER — VITAL AF 1.2 CAL PO LIQD
1000.0000 mL | ORAL | Status: DC
  Administered 2021-10-01 – 2021-10-11 (×10): 1000 mL
  Filled 2021-10-01: qty 1000

## 2021-10-01 MED ORDER — NOREPINEPHRINE 16 MG/250ML-% IV SOLN
0.0000 ug/min | INTRAVENOUS | Status: DC
  Administered 2021-10-01: 06:00:00 10 ug/min via INTRAVENOUS
  Administered 2021-10-02: 10:00:00 8 ug/min via INTRAVENOUS
  Filled 2021-10-01 (×2): qty 250

## 2021-10-01 MED ORDER — SENNA 8.6 MG PO TABS
1.0000 | ORAL_TABLET | Freq: Every day | ORAL | Status: DC
Start: 2021-10-01 — End: 2021-10-20
  Administered 2021-10-01 – 2021-10-19 (×17): 8.6 mg
  Filled 2021-10-01 (×19): qty 1

## 2021-10-01 MED ORDER — NOREPINEPHRINE 4 MG/250ML-% IV SOLN
2.0000 ug/min | INTRAVENOUS | Status: DC
  Filled 2021-10-01: qty 250

## 2021-10-01 MED ORDER — VITAL AF 1.2 CAL PO LIQD
1000.0000 mL | ORAL | Status: DC
  Administered 2021-10-01: 13:00:00 1000 mL

## 2021-10-01 MED ORDER — SERTRALINE HCL 50 MG PO TABS
50.0000 mg | ORAL_TABLET | Freq: Every day | ORAL | Status: DC
Start: 2021-10-01 — End: 2021-10-25
  Administered 2021-10-01 – 2021-10-25 (×25): 50 mg
  Filled 2021-10-01 (×25): qty 1

## 2021-10-01 MED ORDER — DOCUSATE SODIUM 50 MG/5ML PO LIQD
100.0000 mg | Freq: Two times a day (BID) | ORAL | Status: DC
  Administered 2021-10-02 – 2021-10-17 (×20): 100 mg
  Filled 2021-10-01 (×22): qty 10

## 2021-10-01 MED ORDER — SODIUM CHLORIDE 0.9 % IV SOLN
2.0000 g | INTRAVENOUS | Status: DC
  Administered 2021-10-01 – 2021-10-07 (×7): 2 g via INTRAVENOUS
  Filled 2021-10-01 (×7): qty 20

## 2021-10-01 MED ORDER — ACETAMINOPHEN 325 MG PO TABS
650.0000 mg | ORAL_TABLET | Freq: Four times a day (QID) | ORAL | Status: DC | PRN
  Administered 2021-10-01 – 2021-10-06 (×10): 650 mg
  Filled 2021-10-01 (×10): qty 2

## 2021-10-01 MED ORDER — INSULIN ASPART 100 UNIT/ML IJ SOLN
0.0000 [IU] | INTRAMUSCULAR | Status: DC
  Administered 2021-10-01: 12:00:00 11 [IU] via SUBCUTANEOUS
  Administered 2021-10-01: 20:00:00 7 [IU] via SUBCUTANEOUS
  Administered 2021-10-01: 15:00:00 4 [IU] via SUBCUTANEOUS
  Administered 2021-10-01: 11 [IU] via SUBCUTANEOUS
  Administered 2021-10-02 (×2): 4 [IU] via SUBCUTANEOUS
  Administered 2021-10-02: 04:00:00 7 [IU] via SUBCUTANEOUS
  Administered 2021-10-02: 16:00:00 4 [IU] via SUBCUTANEOUS
  Administered 2021-10-03: 3 [IU] via SUBCUTANEOUS
  Administered 2021-10-03: 4 [IU] via SUBCUTANEOUS
  Administered 2021-10-03: 04:00:00 7 [IU] via SUBCUTANEOUS
  Administered 2021-10-03: 08:00:00 4 [IU] via SUBCUTANEOUS
  Administered 2021-10-03: 20:00:00 11 [IU] via SUBCUTANEOUS
  Administered 2021-10-04: 17:00:00 4 [IU] via SUBCUTANEOUS
  Administered 2021-10-04: 3 [IU] via SUBCUTANEOUS
  Administered 2021-10-04: 12:00:00 7 [IU] via SUBCUTANEOUS
  Administered 2021-10-04 – 2021-10-05 (×2): 4 [IU] via SUBCUTANEOUS
  Administered 2021-10-05: 08:00:00 11 [IU] via SUBCUTANEOUS
  Administered 2021-10-05: 04:00:00 4 [IU] via SUBCUTANEOUS
  Administered 2021-10-05: 23:00:00 7 [IU] via SUBCUTANEOUS
  Administered 2021-10-05 (×2): 4 [IU] via SUBCUTANEOUS
  Administered 2021-10-06: 17:00:00 7 [IU] via SUBCUTANEOUS
  Administered 2021-10-06: 04:00:00 4 [IU] via SUBCUTANEOUS
  Administered 2021-10-06: 20:00:00 3 [IU] via SUBCUTANEOUS
  Administered 2021-10-06: 08:00:00 4 [IU] via SUBCUTANEOUS
  Administered 2021-10-06: 13:00:00 7 [IU] via SUBCUTANEOUS
  Administered 2021-10-06 – 2021-10-07 (×4): 4 [IU] via SUBCUTANEOUS
  Administered 2021-10-07: 12:00:00 3 [IU] via SUBCUTANEOUS
  Administered 2021-10-07 – 2021-10-08 (×2): 4 [IU] via SUBCUTANEOUS
  Administered 2021-10-08: 7 [IU] via SUBCUTANEOUS
  Administered 2021-10-08: 3 [IU] via SUBCUTANEOUS
  Administered 2021-10-08: 4 [IU] via SUBCUTANEOUS
  Administered 2021-10-09 – 2021-10-11 (×4): 3 [IU] via SUBCUTANEOUS
  Administered 2021-10-11: 4 [IU] via SUBCUTANEOUS
  Administered 2021-10-11 – 2021-10-12 (×3): 3 [IU] via SUBCUTANEOUS
  Administered 2021-10-12: 7 [IU] via SUBCUTANEOUS
  Administered 2021-10-12 (×2): 4 [IU] via SUBCUTANEOUS
  Administered 2021-10-12: 3 [IU] via SUBCUTANEOUS
  Administered 2021-10-13: 4 [IU] via SUBCUTANEOUS
  Administered 2021-10-13 (×2): 3 [IU] via SUBCUTANEOUS
  Administered 2021-10-13 – 2021-10-14 (×4): 4 [IU] via SUBCUTANEOUS
  Administered 2021-10-14: 7 [IU] via SUBCUTANEOUS
  Administered 2021-10-14: 4 [IU] via SUBCUTANEOUS
  Administered 2021-10-14: 7 [IU] via SUBCUTANEOUS
  Administered 2021-10-15: 3 [IU] via SUBCUTANEOUS
  Administered 2021-10-15: 4 [IU] via SUBCUTANEOUS
  Administered 2021-10-15: 7 [IU] via SUBCUTANEOUS
  Administered 2021-10-15: 4 [IU] via SUBCUTANEOUS
  Administered 2021-10-15 (×2): 3 [IU] via SUBCUTANEOUS
  Administered 2021-10-16: 4 [IU] via SUBCUTANEOUS
  Administered 2021-10-16 (×2): 7 [IU] via SUBCUTANEOUS
  Administered 2021-10-16 (×2): 4 [IU] via SUBCUTANEOUS
  Administered 2021-10-17: 7 [IU] via SUBCUTANEOUS
  Administered 2021-10-17: 4 [IU] via SUBCUTANEOUS
  Administered 2021-10-17 (×2): 7 [IU] via SUBCUTANEOUS
  Administered 2021-10-17: 4 [IU] via SUBCUTANEOUS
  Administered 2021-10-18 (×3): 7 [IU] via SUBCUTANEOUS
  Administered 2021-10-18: 11 [IU] via SUBCUTANEOUS
  Administered 2021-10-18: 3 [IU] via SUBCUTANEOUS
  Administered 2021-10-18: 4 [IU] via SUBCUTANEOUS
  Administered 2021-10-18 – 2021-10-19 (×3): 7 [IU] via SUBCUTANEOUS
  Administered 2021-10-19: 11 [IU] via SUBCUTANEOUS
  Administered 2021-10-19: 3 [IU] via SUBCUTANEOUS
  Administered 2021-10-19: 7 [IU] via SUBCUTANEOUS
  Administered 2021-10-20: 3 [IU] via SUBCUTANEOUS
  Administered 2021-10-20: 4 [IU] via SUBCUTANEOUS
  Administered 2021-10-20: 7 [IU] via SUBCUTANEOUS
  Administered 2021-10-20: 4 [IU] via SUBCUTANEOUS
  Administered 2021-10-21 (×4): 3 [IU] via SUBCUTANEOUS
  Administered 2021-10-22 (×4): 4 [IU] via SUBCUTANEOUS
  Administered 2021-10-23: 3 [IU] via SUBCUTANEOUS
  Administered 2021-10-23: 4 [IU] via SUBCUTANEOUS
  Administered 2021-10-23: 3 [IU] via SUBCUTANEOUS
  Administered 2021-10-23: 4 [IU] via SUBCUTANEOUS
  Administered 2021-10-23: 7 [IU] via SUBCUTANEOUS
  Administered 2021-10-24: 4 [IU] via SUBCUTANEOUS
  Administered 2021-10-24: 7 [IU] via SUBCUTANEOUS
  Administered 2021-10-24: 4 [IU] via SUBCUTANEOUS
  Administered 2021-10-24: 7 [IU] via SUBCUTANEOUS
  Administered 2021-10-24: 4 [IU] via SUBCUTANEOUS
  Administered 2021-10-24: 7 [IU] via SUBCUTANEOUS
  Administered 2021-10-25: 3 [IU] via SUBCUTANEOUS
  Administered 2021-10-25: 7 [IU] via SUBCUTANEOUS
  Administered 2021-10-25 (×2): 4 [IU] via SUBCUTANEOUS

## 2021-10-01 MED ORDER — IPRATROPIUM-ALBUTEROL 0.5-2.5 (3) MG/3ML IN SOLN
3.0000 mL | Freq: Four times a day (QID) | RESPIRATORY_TRACT | Status: DC
Start: 2021-10-01 — End: 2021-10-14
  Administered 2021-10-01 – 2021-10-14 (×50): 3 mL via RESPIRATORY_TRACT
  Filled 2021-10-01 (×49): qty 3

## 2021-10-01 MED ORDER — FENTANYL BOLUS VIA INFUSION
50.0000 ug | INTRAVENOUS | Status: DC | PRN
  Administered 2021-10-01 – 2021-10-02 (×11): 50 ug via INTRAVENOUS
  Filled 2021-10-01: qty 50

## 2021-10-01 MED ORDER — SODIUM CHLORIDE 0.9 % IV SOLN
500.0000 mg | INTRAVENOUS | Status: AC
  Administered 2021-10-01 – 2021-10-05 (×5): 500 mg via INTRAVENOUS
  Filled 2021-10-01 (×5): qty 5

## 2021-10-01 MED ORDER — CHLORHEXIDINE GLUCONATE 0.12% ORAL RINSE (MEDLINE KIT)
15.0000 mL | Freq: Two times a day (BID) | OROMUCOSAL | Status: DC
  Administered 2021-10-01 – 2021-10-26 (×49): 15 mL via OROMUCOSAL

## 2021-10-01 MED ORDER — CHLORHEXIDINE GLUCONATE CLOTH 2 % EX PADS
6.0000 | MEDICATED_PAD | Freq: Every day | CUTANEOUS | Status: DC
  Administered 2021-10-01 – 2021-10-24 (×24): 6 via TOPICAL

## 2021-10-01 MED ORDER — ORAL CARE MOUTH RINSE
15.0000 mL | OROMUCOSAL | Status: DC
  Administered 2021-10-01 – 2021-10-25 (×218): 15 mL via OROMUCOSAL

## 2021-10-01 MED ORDER — POLYETHYLENE GLYCOL 3350 17 G PO PACK
17.0000 g | PACK | Freq: Every day | ORAL | Status: DC
  Administered 2021-10-01 – 2021-10-13 (×7): 17 g
  Filled 2021-10-01 (×7): qty 1

## 2021-10-01 MED ORDER — INSULIN ASPART 100 UNIT/ML IJ SOLN
4.0000 [IU] | INTRAMUSCULAR | Status: DC
  Administered 2021-10-01 – 2021-10-02 (×5): 4 [IU] via SUBCUTANEOUS

## 2021-10-01 NOTE — Progress Notes (Addendum)
NAME:  [name], MRN:  [id number], DOB:  1966/02/16, LOS: 1 ADMISSION DATE:  09/30/2021, CONSULTATION DATE:  09/30/21 REFERRING MD:  EDP, CHIEF COMPLAINT:  acute hypoxic resp failure   History of Present Illness:  55 yo black female with pmh asthma, dm2, hyperlipidemia, PTSD/anxiety and chronic pancreatitis presented via EMS on cpap machine with sats in the mid 60's. Pt is intubated and sedated so ROS and complete history are unobtainable at this time. All history is obtained from ED and chart review.   Per report pt was at home partaking in cocaine with her husband when she began having sudden and severe sob. She was altered, unable to hold herself up in bed, minimally responsive. She had minimal air movement while on cpap and decision was made to emergently intubate pt. CXR revealed pulmonary edema. BP was noted to be extremely elevated >024 systolic. She was started on nitro infusion.   CCM was consulted for admission 2/2 pt's intubated status.   Pertinent  Medical History  DM2 Asthma Hyperlipidemia PTSD, anxiety Chronic pancreatitis  Significant Hospital Events: Including procedures, antibiotic start and stop dates in addition to other pertinent events   12/23: intubated  Interim History / Subjective:  Overnight TG elevated. Off nitro gtt, on levophed low dose. Hyperglycemic.   Objective   Blood pressure 98/62, pulse 77, temperature 99.1 F (37.3 C), resp. rate (!) 28, height 5\' 6"  (1.676 m), weight 74.4 kg, last menstrual period 03/07/2012, SpO2 91 %.    Vent Mode: PRVC FiO2 (%):  [70 %-100 %] 70 % Set Rate:  [20 bmp-28 bmp] 28 bmp Vt Set:  [350 mL-470 mL] 350 mL PEEP:  [14 cmH20] 14 cmH20 Plateau Pressure:  [20 cmH20-35 cmH20] 23 cmH20   Intake/Output Summary (Last 24 hours) at 10/01/2021 1025 Last data filed at 10/01/2021 1000 Gross per 24 hour  Intake 825.73 ml  Output 600 ml  Net 225.73 ml   Filed Weights   09/30/21 2200 10/01/21 0630  Weight: 76 kg 74.4 kg     Examination: Gen:      Intubated, sedated, acutely ill appearing HEENT:  ETT to vent Lungs:    sounds of mechanical ventilation auscultated, breath sounds decreased bilaterally CV:         RRR no mrg Abd:      + bowel sounds; soft, non-tender; no palpable masses, no distension Ext:    No edema Skin:      Warm and dry; no rashes Neuro:   sedated, RASS -2, moves all 4 extremities  Labs reviewed Mild AKI CBGs elevated  ABG shows hypercapnia and hypoxema Na 134 Mg low at 1.6 Flu and covid negative  Resolved Hospital Problem list     Assessment & Plan:  Acute hypoxic resp failure, poa:  Acute pulmonary edema:  Asthma:  - maintain full vent support, LTVV VAP bundle -titrate vent down as tolerated with daily SAT/SBT per protocol - off nitro gtt. Despite low BNP will challenge with diuresis to IV lasix given clinical scenario - follow up echo -prn bronchodilators  HTN emergency, now witih circulatory shock likely secondary to sedation meds:  -2/2 cocaine use -resulting in pulmonary edema -now of nitro gtt and now on low dose pressors.  -avoid bb. Hold home meds given hypotensive  Sedation for mechanical ventilation - stop propofol due to hypotension, elevated Tgs - start ketamine, continue fentanyl, add precedex if needed.  - she is on narcotics at home and likely has some opioid dependence.  Substance abuse:  -known h/o cocaine use and endorsed current use -encourage cessation -uds pending -avoid bb in setting of cocaine use  Lactic acidosis:  -trend  T2dm:  -a1c pending -ssi -start scheduled glargine 15 units BID, she is on 45 units nightly at home.  - goal CBG 140-180  PSTD:  -resume SSRI    Best Practice (right click and "Reselect all SmartList Selections" daily)   Diet/type: NPO DVT prophylaxis: LMWH GI prophylaxis: PPI Lines: N/A Foley:  Yes, and it is still needed Code Status:  full code Last date of multidisciplinary goals of care  discussion [discussed with patient's daughter at bedside 12/24]  The patient is critically ill due to respiratory failure, circulatory shock.  Critical care was necessary to treat or prevent imminent or life-threatening deterioration.  Critical care was time spent personally by me on the following activities: development of treatment plan with patient and/or surrogate as well as nursing, discussions with consultants, evaluation of patient's response to treatment, examination of patient, obtaining history from patient or surrogate, ordering and performing treatments and interventions, ordering and review of laboratory studies, ordering and review of radiographic studies, pulse oximetry, re-evaluation of patient's condition and participation in multidisciplinary rounds.   Critical Care Time devoted to patient care services described in this note is 40 minutes. This time reflects time of care of this [place] . This critical care time does not reflect separately billable procedures or procedure time, teaching time or supervisory time of PA/NP/Med student/Med Resident etc but could involve care discussion time.       Spero Geralds [facility] 10/01/2021 10:30 AM  Pager: see AMION  If no response to pager , please call critical care on call (see AMION) until 7pm After 7:00 pm call Elink     Labs   CBC: Recent Labs  Lab 09/30/21 2027 09/30/21 2036 10/01/21 0131 10/01/21 0510 10/01/21 0533  WBC 12.8*  --   --  24.3*  --   NEUTROABS 9.4*  --   --   --   --   HGB 14.1 15.0 14.3 13.3 13.9  HCT 43.8 44.0 42.0 40.1 41.0  MCV 95.4  --   --  94.4  --   PLT 260  --   --  305  --     Basic Metabolic Panel: Recent Labs  Lab 09/30/21 2027 09/30/21 2036 10/01/21 0131 10/01/21 0510 10/01/21 0533  NA 133* 137 133* 131* 134*  K 3.7 3.2* 4.2 4.3 4.4  CL 101  --   --  97*  --   CO2 22  --   --  24  --   GLUCOSE 256*  --   --  340*  --   BUN 15  --    --  18  --   CREATININE 1.04*  --   --  1.41*  --   CALCIUM 8.9  --   --  8.7*  --   MG  --   --   --  1.6*  --   PHOS  --   --   --  3.4  --    GFR: Estimated Creatinine Clearance: 45.4 mL/min (A) (by C-G formula based on SCr of 1.41 mg/dL (H)). Recent Labs  Lab 09/30/21 2027 09/30/21 2032 10/01/21 0510  WBC 12.8*  --  24.3*  LATICACIDVEN  --  3.0*  --     Liver Function Tests: Recent Labs  Lab 09/30/21  2027  AST 33  ALT 20  ALKPHOS 59  BILITOT 0.6  PROT 6.7  ALBUMIN 3.5   Recent Labs  Lab 09/30/21 2027  LIPASE 36   No results for input(s): AMMONIA in the last 168 hours.  ABG    Component Value Date/Time   PHART 7.250 (L) 10/01/2021 0533   PCO2ART 57.5 (H) 10/01/2021 0533   PO2ART 72 (L) 10/01/2021 0533   HCO3 25.2 10/01/2021 0533   TCO2 27 10/01/2021 0533   ACIDBASEDEF 3.0 (H) 10/01/2021 0533   O2SAT 91.0 10/01/2021 0533     Coagulation Profile: Recent Labs  Lab 09/30/21 2027  INR 0.9    Cardiac Enzymes: No results for input(s): CKTOTAL, CKMB, CKMBINDEX, TROPONINI in the last 168 hours.  HbA1C: Hgb A1c MFr Bld  Date/Time Value Ref Range Status  10/01/2021 08:39 AM 9.1 (H) 4.8 - 5.6 % Final    Comment:    (NOTE) Pre diabetes:          5.7%-6.4%  Diabetes:              >6.4%  Glycemic control for   <7.0% adults with diabetes   09/11/2018 03:57 PM 7.6 (H) 4.8 - 5.6 % Final    Comment:             Prediabetes: 5.7 - 6.4          Diabetes: >6.4          Glycemic control for adults with diabetes: <7.0     CBG: Recent Labs  Lab 10/01/21 0009 10/01/21 0316 10/01/21 0717  GLUCAP 373* 306* 295*

## 2021-10-01 NOTE — Progress Notes (Signed)
[facility] Progress Note Patient Name: [name] DOB: 1965-12-30 MRN: [id number]   Date of Service  10/01/2021  HPI/Events of Note  Pt original Levo order was for peripheral infusion max 10. Pt now has CVC placed, need order changed  eICU Interventions  Norepinephrine central line order set placed     Intervention Category Minor Interventions: Routine modifications to care plan (e.g. PRN medications for pain, fever)  [name] Deakin Lacek 10/01/2021, 5:21 AM

## 2021-10-01 NOTE — Progress Notes (Signed)
[facility] Progress Note Patient Name: [name] DOB: 1965/10/31 MRN: [id number]   Date of Service  10/01/2021  HPI/Events of Note  16 F history of DM, dyslipidemia, asthma, PTSD, presented with shortness of breath preceded by flu like.  Seen intubated 25/470 (8cc/kg)/90%/12 PEEP, plateau pressure BP 102/55  HR 77 O2 98% On norepinephrine, sedated on propofol and fentanyl  eICU Interventions  Low tidal volume ventilation, plateau pressure improved to 26 on 6 cc/kg Being managed as pulmonary edema     Intervention Category Evaluation Type: New Patient Evaluation  [name] Joshu Furukawa 10/01/2021, 2:24 AM

## 2021-10-01 NOTE — Progress Notes (Signed)
Pt transported from ED room 6 to 6L73 w/o complication. Rt will cont to monitor.

## 2021-10-01 NOTE — Progress Notes (Signed)
Initial Nutrition Assessment  DOCUMENTATION CODES:   Not applicable  INTERVENTION:  -Begin TF via OGT:  Vital AF 1.2 @ 40ml/hr (146ml/d)  Provides 1728 kcals, 108 grams protein, 1167 ml free water  NUTRITION DIAGNOSIS:   Inadequate oral intake related to inability to eat as evidenced by NPO status.  GOAL:   Provide needs based on ASPEN/SCCM guidelines  MONITOR:   Labs, I & O's, Weight trends, TF tolerance, Vent status  REASON FOR ASSESSMENT:   Consult Enteral/tube feeding initiation and management  ASSESSMENT:   Pt with PMH significant for drug abuse, asthma, type 2 DM, HLD, PTSD/anxiety, and chronic pancreatitis admitted with acute hypoxic respiratory failure and acute pulmonary edema requiring intubation.  12/23 - intubated  Per CCM, plan to titrate vent down as tolerated with daily SAT/SBT per protocol. CCM also noted pt now with circulatory shock likely 2/2 sedation meds and pt's reported cocaine use.   RD consulted to initiate TF via OGT, gastric tip confirmed via xray. Unable to obtain diet/wt hx at this time as RD is working remotely. Will attempt at follow-up.   Weight history reviewed. No significant weight changes noted.  Patient is currently intubated on ventilator support MV: 9.3 L/min Temp (24hrs), Avg:99.4 F (37.4 C), Min:95.2 F (35.1 C), Max:100.2 F (37.9 C) MAP >65  Medications: colace, SSI Q4H, semglee 15 units BID, protonix, miralax Drips: fentanyl, levophed, propofol @ 27.63ml/hr (providing 723kcals at current rate)  Labs: Recent Labs  Lab 09/30/21 2027 09/30/21 2036 10/01/21 0131 10/01/21 0510 10/01/21 0533  NA 133*   < > 133* 131* 134*  K 3.7   < > 4.2 4.3 4.4  CL 101  --   --  97*  --   CO2 22  --   --  24  --   BUN 15  --   --  18  --   CREATININE 1.04*  --   --  1.41*  --   CALCIUM 8.9  --   --  8.7*  --   MG  --   --   --  1.6*  --   PHOS  --   --   --  3.4  --   GLUCOSE 256*  --   --  340*  --    < > = values in this  interval not displayed.  CBGs: 261-373 x24 hours    UOP: 339ml x24 hours OGT output: 291ml x24 hours I/O: +254ml since admit  NUTRITION - FOCUSED PHYSICAL EXAM: Unable to perform at this time as RD is working remotely. Will attempt at follow-up.   Diet Order:   Diet Order             Diet NPO time specified  Diet effective now                   EDUCATION NEEDS:   Not appropriate for education at this time  Skin:  Skin Assessment: Reviewed RN Assessment  Last BM:  PTA  Height:   Ht Readings from Last 1 Encounters:  09/30/21 5\' 6"  (1.676 m)    Weight:   Wt Readings from Last 1 Encounters:  10/01/21 74.4 kg     BMI:  Body mass index is 26.47 kg/m.  Estimated Nutritional Needs:   Kcal:  1700  Protein:  90-105 grams  Fluid:  >1.7L     Theone Stanley., MS, RD, LDN (she/her/hers) RD pager number and weekend/on-call pager number located in [place].

## 2021-10-02 DIAGNOSIS — J189 Pneumonia, unspecified organism: Principal | ICD-10-CM

## 2021-10-02 DIAGNOSIS — R739 Hyperglycemia, unspecified: Secondary | ICD-10-CM

## 2021-10-02 DIAGNOSIS — J9601 Acute respiratory failure with hypoxia: Secondary | ICD-10-CM | POA: Diagnosis not present

## 2021-10-02 DIAGNOSIS — A419 Sepsis, unspecified organism: Secondary | ICD-10-CM

## 2021-10-02 DIAGNOSIS — R6521 Severe sepsis with septic shock: Secondary | ICD-10-CM

## 2021-10-02 DIAGNOSIS — J810 Acute pulmonary edema: Secondary | ICD-10-CM | POA: Diagnosis not present

## 2021-10-02 DIAGNOSIS — F1410 Cocaine abuse, uncomplicated: Secondary | ICD-10-CM | POA: Diagnosis not present

## 2021-10-02 LAB — BASIC METABOLIC PANEL
Anion gap: 7 (ref 5–15)
BUN: 33 mg/dL — ABNORMAL HIGH (ref 6–20)
CO2: 27 mmol/L (ref 22–32)
Calcium: 8.4 mg/dL — ABNORMAL LOW (ref 8.9–10.3)
Chloride: 102 mmol/L (ref 98–111)
Creatinine, Ser: 1.42 mg/dL — ABNORMAL HIGH (ref 0.44–1.00)
GFR, Estimated: 43 mL/min — ABNORMAL LOW (ref >60)
Glucose, Bld: 190 mg/dL — ABNORMAL HIGH (ref 70–99)
Potassium: 4.8 mmol/L (ref 3.5–5.1)
Sodium: 136 mmol/L (ref 135–145)

## 2021-10-02 LAB — POCT I-STAT 7, (LYTES, BLD GAS, ICA,H+H)
Acid-Base Excess: 0 mmol/L (ref 0.0–2.0)
Acid-base deficit: 1 mmol/L (ref 0.0–2.0)
Bicarbonate: 28.6 mmol/L — ABNORMAL HIGH (ref 20.0–28.0)
Bicarbonate: 27.2 mmol/L (ref 20.0–28.0)
Calcium, Ion: 1.24 mmol/L (ref 1.15–1.40)
Calcium, Ion: 1.2 mmol/L (ref 1.15–1.40)
HCT: 35 % — ABNORMAL LOW (ref 36.0–46.0)
HCT: 33 % — ABNORMAL LOW (ref 36.0–46.0)
Hemoglobin: 11.9 g/dL — ABNORMAL LOW (ref 12.0–15.0)
Hemoglobin: 11.2 g/dL — ABNORMAL LOW (ref 12.0–15.0)
O2 Saturation: 90 %
O2 Saturation: 85 %
Patient temperature: 38
Patient temperature: 99.2
Potassium: 4.8 mmol/L (ref 3.5–5.1)
Potassium: 4.5 mmol/L (ref 3.5–5.1)
Sodium: 137 mmol/L (ref 135–145)
Sodium: 137 mmol/L (ref 135–145)
TCO2: 31 mmol/L (ref 22–32)
TCO2: 29 mmol/L (ref 22–32)
pCO2 arterial: 77.9 mmHg (ref 32.0–48.0)
pCO2 arterial: 53.6 mmHg — ABNORMAL HIGH (ref 32.0–48.0)
pH, Arterial: 7.179 — CL (ref 7.350–7.450)
pH, Arterial: 7.315 — ABNORMAL LOW (ref 7.350–7.450)
pO2, Arterial: 80 mmHg — ABNORMAL LOW (ref 83.0–108.0)
pO2, Arterial: 57 mmHg — ABNORMAL LOW (ref 83.0–108.0)

## 2021-10-02 LAB — CBC
HCT: 35.3 % — ABNORMAL LOW (ref 36.0–46.0)
Hemoglobin: 11.3 g/dL — ABNORMAL LOW (ref 12.0–15.0)
MCH: 31 pg (ref 26.0–34.0)
MCHC: 32 g/dL (ref 30.0–36.0)
MCV: 97 fL (ref 80.0–100.0)
Platelets: 241 10*3/uL (ref 150–400)
RBC: 3.64 MIL/uL — ABNORMAL LOW (ref 3.87–5.11)
RDW: 13.5 % (ref 11.5–15.5)
WBC: 20.3 10*3/uL — ABNORMAL HIGH (ref 4.0–10.5)
nRBC: 0 % (ref 0.0–0.2)

## 2021-10-02 LAB — GLUCOSE, CAPILLARY
Glucose-Capillary: 236 mg/dL — ABNORMAL HIGH (ref 70–99)
Glucose-Capillary: 188 mg/dL — ABNORMAL HIGH (ref 70–99)
Glucose-Capillary: 170 mg/dL — ABNORMAL HIGH (ref 70–99)
Glucose-Capillary: 172 mg/dL — ABNORMAL HIGH (ref 70–99)
Glucose-Capillary: 150 mg/dL — ABNORMAL HIGH (ref 70–99)
Glucose-Capillary: 96 mg/dL (ref 70–99)
Glucose-Capillary: 134 mg/dL — ABNORMAL HIGH (ref 70–99)

## 2021-10-02 LAB — PROCALCITONIN: Procalcitonin: 35.38 ng/mL

## 2021-10-02 LAB — MAGNESIUM: Magnesium: 2.3 mg/dL (ref 1.7–2.4)

## 2021-10-02 LAB — URINE CULTURE: Culture: NO GROWTH

## 2021-10-02 LAB — PHOSPHORUS: Phosphorus: 5.6 mg/dL — ABNORMAL HIGH (ref 2.5–4.6)

## 2021-10-02 MED ORDER — DEXMEDETOMIDINE HCL IN NACL 400 MCG/100ML IV SOLN
0.4000 ug/kg/h | INTRAVENOUS | Status: DC
Start: 2021-10-02 — End: 2021-10-03
  Administered 2021-10-02: 13:00:00 1.2 ug/kg/h via INTRAVENOUS
  Administered 2021-10-02: 09:00:00 0.4 ug/kg/h via INTRAVENOUS
  Administered 2021-10-02 – 2021-10-03 (×4): 1.2 ug/kg/h via INTRAVENOUS
  Filled 2021-10-02 (×4): qty 100
  Filled 2021-10-02 (×2): qty 200

## 2021-10-02 MED ORDER — SODIUM CHLORIDE 0.9 % IV SOLN: INTRAVENOUS | Status: DC | PRN

## 2021-10-02 MED ORDER — SODIUM CHLORIDE 0.9 % IV SOLN
0.5000 mg/h | INTRAVENOUS | Status: DC
  Administered 2021-10-02: 23:00:00 4 mg/h via INTRAVENOUS
  Administered 2021-10-02: 13:00:00 1 mg/h via INTRAVENOUS
  Administered 2021-10-03 – 2021-10-07 (×8): 4 mg/h via INTRAVENOUS
  Administered 2021-10-07: 2 mg/h via INTRAVENOUS
  Administered 2021-10-08 – 2021-10-09 (×3): 4 mg/h via INTRAVENOUS
  Administered 2021-10-10: 3 mg/h via INTRAVENOUS
  Administered 2021-10-10: 4 mg/h via INTRAVENOUS
  Administered 2021-10-11: 3.5 mg/h via INTRAVENOUS
  Administered 2021-10-12 (×3): 4 mg/h via INTRAVENOUS
  Administered 2021-10-13: 3 mg/h via INTRAVENOUS
  Administered 2021-10-14: 0.5 mg/h via INTRAVENOUS
  Administered 2021-10-15 – 2021-10-17 (×3): 3 mg/h via INTRAVENOUS
  Filled 2021-10-02 (×30): qty 5

## 2021-10-02 MED ORDER — SODIUM CHLORIDE 0.9% FLUSH: 10.0000 mL | INTRAVENOUS | Status: DC | PRN

## 2021-10-02 MED ORDER — HYDROMORPHONE BOLUS VIA INFUSION
0.5000 mg | INTRAVENOUS | Status: DC | PRN
  Administered 2021-10-02 – 2021-10-17 (×26): 0.5 mg via INTRAVENOUS
  Filled 2021-10-02: qty 1

## 2021-10-02 MED ORDER — INSULIN ASPART 100 UNIT/ML IJ SOLN
6.0000 [IU] | INTRAMUSCULAR | Status: DC
  Administered 2021-10-02 – 2021-10-06 (×19): 6 [IU] via SUBCUTANEOUS

## 2021-10-02 MED ORDER — MIDAZOLAM-SODIUM CHLORIDE 100-0.9 MG/100ML-% IV SOLN
0.5000 mg/h | INTRAVENOUS | Status: DC
Start: 2021-10-02 — End: 2021-10-18
  Administered 2021-10-02: 16:00:00 1 mg/h via INTRAVENOUS
  Administered 2021-10-03: 14:00:00 4 mg/h via INTRAVENOUS
  Administered 2021-10-04 – 2021-10-05 (×2): 4.5 mg/h via INTRAVENOUS
  Administered 2021-10-07 – 2021-10-08 (×3): 3 mg/h via INTRAVENOUS
  Administered 2021-10-09 – 2021-10-11 (×3): 4 mg/h via INTRAVENOUS
  Administered 2021-10-12 – 2021-10-13 (×3): 5 mg/h via INTRAVENOUS
  Administered 2021-10-14: 3 mg/h via INTRAVENOUS
  Administered 2021-10-15: 6 mg/h via INTRAVENOUS
  Administered 2021-10-15: 4 mg/h via INTRAVENOUS
  Administered 2021-10-16: 2 mg/h via INTRAVENOUS
  Filled 2021-10-02 (×16): qty 100

## 2021-10-02 MED ORDER — SODIUM CHLORIDE 0.9% FLUSH
10.0000 mL | Freq: Two times a day (BID) | INTRAVENOUS | Status: DC
  Administered 2021-10-02 – 2021-10-04 (×3): 10 mL

## 2021-10-02 MED ORDER — INSULIN GLARGINE-YFGN 100 UNIT/ML ~~LOC~~ SOLN
25.0000 [IU] | Freq: Two times a day (BID) | SUBCUTANEOUS | Status: DC
Start: 2021-10-02 — End: 2021-10-09
  Administered 2021-10-02 – 2021-10-09 (×14): 25 [IU] via SUBCUTANEOUS
  Filled 2021-10-02 (×16): qty 0.25

## 2021-10-02 NOTE — Progress Notes (Signed)
65 mL of ketamine drip (500 mg/100 mL) wasted in [employer] and witnessed by Devoria Glassing, RN.

## 2021-10-02 NOTE — Progress Notes (Signed)
NAME:  [name], MRN:  [id number], DOB:  1966-07-28, LOS: 2 ADMISSION DATE:  09/30/2021, CONSULTATION DATE:  09/30/21 REFERRING MD:  EDP, CHIEF COMPLAINT:  acute hypoxic resp failure   History of Present Illness:  55 yo black female with pmh asthma, dm2, hyperlipidemia, PTSD/anxiety and chronic pancreatitis presented via EMS on cpap machine with sats in the mid 60's. Pt is intubated and sedated so ROS and complete history are unobtainable at this time. All history is obtained from ED and chart review.   Per report pt was at home partaking in cocaine with her husband when she began having sudden and severe sob. She was altered, unable to hold herself up in bed, minimally responsive. She had minimal air movement while on cpap and decision was made to emergently intubate pt. CXR revealed pulmonary edema. BP was noted to be extremely elevated >779 systolic. She was started on nitro infusion.   CCM was consulted for admission 2/2 pt's intubated status.   Pertinent  Medical History  DM2 Asthma Hyperlipidemia PTSD, anxiety Chronic pancreatitis  Significant Hospital Events: Including procedures, antibiotic start and stop dates in addition to other pertinent events   12/23: intubated 12/24: febrile, abx started for CAP  Interim History / Subjective:  Still on pressors. Febrile again this morning.   Objective   Blood pressure 128/67, pulse 88, temperature (!) 101.3 F (38.5 C), resp. rate (!) 24, height 5\' 6"  (1.676 m), weight 73.3 kg, last menstrual period 03/07/2012, SpO2 95 %.    Vent Mode: PRVC FiO2 (%):  [50 %-80 %] 50 % Set Rate:  [28 bmp-30 bmp] 30 bmp Vt Set:  [350 mL] 350 mL PEEP:  [14 cmH20] 14 cmH20 Plateau Pressure:  [26 cmH20-29 cmH20] 28 cmH20   Intake/Output Summary (Last 24 hours) at 10/02/2021 1029 Last data filed at 10/02/2021 0900 Gross per 24 hour  Intake 2811.37 ml  Output 1075 ml  Net 1736.37 ml   Filed Weights   10/01/21 0630 10/01/21 1300 10/02/21 0500   Weight: 74.4 kg 73.3 kg 73.3 kg    Examination: Gen:      Intubated, sedated, acutely ill appearing HEENT:  ETT to vent Lungs:    sounds of mechanical ventilation auscultated, coarse bilateral rhonchi CV:         RRR no mrg Abd:      + bowel sounds; soft, non-tender; no palpable masses, no distension Ext:    No edema Skin:      Warm and dry; no rashes Neuro:   sedated, RASS -2   Labs reviewed: Worsening hypercapnic respiratory failure on ABG WBC 20 Hgb 11.3 BMET pending PCT 59   Resolved Hospital Problem list     Assessment & Plan:  Acute hypoxic and hypercapnic resp failure, poa:  Acute pulmonary edema:  Asthma with exacerbation Concern for CAP - maintain full vent support, LTVV VAP bundle - continue ceftriaxone and azithromycin. PCT elevated - may need diuresis - vent settings adjusted at bedside with RT- increased Vt to 7cc/kg and increased RR. Some improvement in ABG. Permissive hypercapnia   Septic Shock HTN -initially hypertensive 2/2 cocaine use - resulting in pulmonary edema. May still need diuresis at some point - holding home meds for BP - treatment of CAP as above, sending sputum and blood cx  Sedation for mechanical ventilation Substance abuse:  PSTD:  - trying to wean propofol off today.  - continue ketamine, fentanyl, add precedex, scheduled opioids -known h/o cocaine use and endorsed current use -encourage  cessation -avoid bb in setting of cocaine use -continue SSRI  T2dm with hyperglycemia - CBGs reviewed, still elevated - increase glargine to 25 units BID - increase tube feed coverage, continue SSI - goal CBG 140-180    Best Practice (right click and "Reselect all SmartList Selections" daily)   Diet/type: NPO DVT prophylaxis: LMWH GI prophylaxis: PPI Lines: N/A Foley:  Yes, and it is still needed Code Status:  full code Last date of multidisciplinary goals of care discussion [discussed with patient's daughter at bedside  12/24]  The patient is critically ill due to respiratory failure, septic shock, hyperglycemia.  Critical care was necessary to treat or prevent imminent or life-threatening deterioration.  Critical care was time spent personally by me on the following activities: development of treatment plan with patient and/or surrogate as well as nursing, discussions with consultants, evaluation of patient's response to treatment, examination of patient, obtaining history from patient or surrogate, ordering and performing treatments and interventions, ordering and review of laboratory studies, ordering and review of radiographic studies, pulse oximetry, re-evaluation of patient's condition and participation in multidisciplinary rounds.   Critical Care Time devoted to patient care services described in this note is 39 minutes. This time reflects time of care of this [place] . This critical care time does not reflect separately billable procedures or procedure time, teaching time or supervisory time of PA/NP/Med student/Med Resident etc but could involve care discussion time.       Spero Geralds [facility] 10/02/2021 10:36 AM  Pager: see AMION  If no response to pager , please call critical care on call (see AMION) until 7pm After 7:00 pm call Elink      Labs   CBC: Recent Labs  Lab 09/30/21 2027 09/30/21 2036 10/01/21 0510 10/01/21 0533 10/02/21 0625 10/02/21 0641 10/02/21 0935  WBC 12.8*  --  24.3*  --   --  20.3*  --   NEUTROABS 9.4*  --   --   --   --   --   --   HGB 14.1   < > 13.3 13.9 11.9* 11.3* 11.2*  HCT 43.8   < > 40.1 41.0 35.0* 35.3* 33.0*  MCV 95.4  --  94.4  --   --  97.0  --   PLT 260  --  305  --   --  241  --    < > = values in this interval not displayed.    Basic Metabolic Panel: Recent Labs  Lab 09/30/21 2027 09/30/21 2036 10/01/21 0131 10/01/21 0510 10/01/21 0533 10/01/21 1251 10/01/21 1622 10/02/21 0625  10/02/21 0935  NA 133*   < > 133* 131* 134*  --   --  137 137  K 3.7   < > 4.2 4.3 4.4  --   --  4.8 4.5  CL 101  --   --  97*  --   --   --   --   --   CO2 22  --   --  24  --   --   --   --   --   GLUCOSE 256*  --   --  340*  --   --   --   --   --   BUN 15  --   --  18  --   --   --   --   --   CREATININE 1.04*  --   --  1.41*  --   --   --   --   --   CALCIUM 8.9  --   --  8.7*  --   --   --   --   --   MG  --   --   --  1.6*  --  1.9 1.9  --   --   PHOS  --   --   --  3.4  --  4.8* 5.8*  --   --    < > = values in this interval not displayed.   GFR: Estimated Creatinine Clearance: 45.1 mL/min (A) (by C-G formula based on SCr of 1.41 mg/dL (H)). Recent Labs  Lab 09/30/21 2027 09/30/21 2032 10/01/21 0510 10/01/21 1428 10/02/21 0641  PROCALCITON  --   --   --  59.18  --   WBC 12.8*  --  24.3*  --  20.3*  LATICACIDVEN  --  3.0*  --   --   --     Liver Function Tests: Recent Labs  Lab 09/30/21 2027  AST 33  ALT 20  ALKPHOS 59  BILITOT 0.6  PROT 6.7  ALBUMIN 3.5   Recent Labs  Lab 09/30/21 2027  LIPASE 36   No results for input(s): AMMONIA in the last 168 hours.  ABG    Component Value Date/Time   PHART 7.315 (L) 10/02/2021 0935   PCO2ART 53.6 (H) 10/02/2021 0935   PO2ART 57 (L) 10/02/2021 0935   HCO3 27.2 10/02/2021 0935   TCO2 29 10/02/2021 0935   ACIDBASEDEF 1.0 10/02/2021 0625   O2SAT 85.0 10/02/2021 0935     Coagulation Profile: Recent Labs  Lab 09/30/21 2027  INR 0.9    Cardiac Enzymes: No results for input(s): CKTOTAL, CKMB, CKMBINDEX, TROPONINI in the last 168 hours.  HbA1C: Hgb A1c MFr Bld  Date/Time Value Ref Range Status  10/01/2021 08:39 AM 9.1 (H) 4.8 - 5.6 % Final    Comment:    (NOTE) Pre diabetes:          5.7%-6.4%  Diabetes:              >6.4%  Glycemic control for   <7.0% adults with diabetes   09/11/2018 03:57 PM 7.6 (H) 4.8 - 5.6 % Final    Comment:             Prediabetes: 5.7 - 6.4          Diabetes: >6.4           Glycemic control for adults with diabetes: <7.0     CBG: Recent Labs  Lab 10/01/21 1520 10/01/21 1931 10/01/21 2338 10/02/21 0325 10/02/21 0806  GLUCAP 193* 208* 257* 236* 188*

## 2021-10-02 NOTE — Procedures (Signed)
Arterial Catheter Insertion Procedure Note  [name]  [id number]  October 08, 1966  Date:10/02/21  Time:10:54 AM    Provider Performing: Carren Rang    Procedure: Insertion of Arterial Line [phone]) without US guidance  Indication(s) Blood pressure monitoring and/or need for frequent ABGs  Consent Unable to obtain consent due to emergent nature of procedure.  Anesthesia None   Time Out Verified patient identification, verified procedure, site/side was marked, verified correct patient position, special equipment/implants available, medications/allergies/relevant history reviewed, required imaging and test results available.   Sterile Technique Maximal sterile technique including full sterile barrier drape, hand hygiene, sterile gown, sterile gloves, mask, hair covering, sterile ultrasound probe cover (if used).   Procedure Description Area of catheter insertion was cleaned with chlorhexidine and draped in sterile fashion. Without real-time ultrasound guidance an arterial catheter was placed into the left radial artery.  Appropriate arterial tracings confirmed on monitor.     Complications/Tolerance None; patient tolerated the procedure well.   EBL Minimal   Specimen(s) None

## 2021-10-03 DIAGNOSIS — J9601 Acute respiratory failure with hypoxia: Secondary | ICD-10-CM | POA: Diagnosis not present

## 2021-10-03 DIAGNOSIS — J810 Acute pulmonary edema: Secondary | ICD-10-CM | POA: Diagnosis not present

## 2021-10-03 DIAGNOSIS — F1410 Cocaine abuse, uncomplicated: Secondary | ICD-10-CM | POA: Diagnosis not present

## 2021-10-03 LAB — CBC
HCT: 32.1 % — ABNORMAL LOW (ref 36.0–46.0)
Hemoglobin: 10.3 g/dL — ABNORMAL LOW (ref 12.0–15.0)
MCH: 30.6 pg (ref 26.0–34.0)
MCHC: 32.1 g/dL (ref 30.0–36.0)
MCV: 95.3 fL (ref 80.0–100.0)
Platelets: 207 10*3/uL (ref 150–400)
RBC: 3.37 MIL/uL — ABNORMAL LOW (ref 3.87–5.11)
RDW: 13.8 % (ref 11.5–15.5)
WBC: 19.6 10*3/uL — ABNORMAL HIGH (ref 4.0–10.5)
nRBC: 0 % (ref 0.0–0.2)

## 2021-10-03 LAB — GLUCOSE, CAPILLARY
Glucose-Capillary: 118 mg/dL — ABNORMAL HIGH (ref 70–99)
Glucose-Capillary: 145 mg/dL — ABNORMAL HIGH (ref 70–99)
Glucose-Capillary: 173 mg/dL — ABNORMAL HIGH (ref 70–99)
Glucose-Capillary: 190 mg/dL — ABNORMAL HIGH (ref 70–99)
Glucose-Capillary: 198 mg/dL — ABNORMAL HIGH (ref 70–99)
Glucose-Capillary: 241 mg/dL — ABNORMAL HIGH (ref 70–99)
Glucose-Capillary: 88 mg/dL (ref 70–99)

## 2021-10-03 LAB — BASIC METABOLIC PANEL
Anion gap: 5 (ref 5–15)
BUN: 30 mg/dL — ABNORMAL HIGH (ref 6–20)
CO2: 26 mmol/L (ref 22–32)
Calcium: 8.4 mg/dL — ABNORMAL LOW (ref 8.9–10.3)
Chloride: 106 mmol/L (ref 98–111)
Creatinine, Ser: 1.01 mg/dL — ABNORMAL HIGH (ref 0.44–1.00)
GFR, Estimated: >60 mL/min (ref >60)
Glucose, Bld: 200 mg/dL — ABNORMAL HIGH (ref 70–99)
Potassium: 3.9 mmol/L (ref 3.5–5.1)
Sodium: 137 mmol/L (ref 135–145)

## 2021-10-03 LAB — PROCALCITONIN: Procalcitonin: 19.03 ng/mL

## 2021-10-03 MED ORDER — FUROSEMIDE 10 MG/ML IJ SOLN
80.0000 mg | Freq: Once | INTRAMUSCULAR | Status: AC
  Administered 2021-10-03: 10:00:00 80 mg via INTRAVENOUS
  Filled 2021-10-03: qty 8

## 2021-10-03 NOTE — Progress Notes (Signed)
NAME:  [name], MRN:  [id number], DOB:  1966/09/04, LOS: 3 ADMISSION DATE:  09/30/2021, CONSULTATION DATE:  09/30/21 REFERRING MD:  EDP, CHIEF COMPLAINT:  acute hypoxic resp failure   History of Present Illness:  55 yo black female with pmh asthma, dm2, hyperlipidemia, PTSD/anxiety and chronic pancreatitis presented via EMS on cpap machine with sats in the mid 60's. Pt is intubated and sedated so ROS and complete history are unobtainable at this time. All history is obtained from ED and chart review.   Per report pt was at home partaking in cocaine with her husband when she began having sudden and severe sob. She was altered, unable to hold herself up in bed, minimally responsive. She had minimal air movement while on cpap and decision was made to emergently intubate pt. CXR revealed pulmonary edema. BP was noted to be extremely elevated >335 systolic. She was started on nitro infusion.   CCM was consulted for admission 2/2 pt's intubated status.   Pertinent  Medical History  DM2 Asthma Hyperlipidemia PTSD, anxiety Chronic pancreatitis  Significant Hospital Events: Including procedures, antibiotic start and stop dates in addition to other pertinent events   12/23: intubated 12/24: febrile, abx started for CAP 12/25 struggled with sedation - failed ketamine, precedex, dilaudid. Febrile and cultured  Interim History / Subjective:  More comfortable on versed gtt this morning. Synchronous with ventilator Peak pressure 32, plateau 18. Off pressors  Objective   Blood pressure 102/62, pulse 71, temperature 99.3 F (37.4 C), resp. rate (!) 30, height 5\' 6"  (1.676 m), weight 67.7 kg, last menstrual period 03/07/2012, SpO2 93 %.    Vent Mode: PRVC FiO2 (%):  [40 %-50 %] 40 % Set Rate:  [30 bmp] 30 bmp Vt Set:  [410 mL] 410 mL PEEP:  [8 cmH20-10 cmH20] 8 cmH20 Plateau Pressure:  [18 cmH20-30 cmH20] 18 cmH20   Intake/Output Summary (Last 24 hours) at 10/03/2021 1036 Last data filed  at 10/03/2021 0800 Gross per 24 hour  Intake 1970.28 ml  Output 1775 ml  Net 195.28 ml   Filed Weights   10/01/21 1300 10/02/21 0500 10/03/21 0347  Weight: 73.3 kg 73.3 kg 67.7 kg    Examination: Gen:      Intubated, sedated, acutely ill appearing HEENT:  ETT to vent Lungs:    sounds of mechanical ventilation auscultated, mild wheezing and rhonchi improved from yesterday CV:         RRR no mrg Abd:      + bowel sounds; soft, non-tender; no palpable masses, no distension Ext:    No edema Skin:      Warm and dry; no rashes Neuro:   sedated, RASS -3, moves all 4 extremities    Labs reviewed: ABG improved    Resolved Hospital Problem list     Assessment & Plan:  Acute hypoxic and hypercapnic resp failure, poa:  Acute pulmonary edema:  CAP - maintain full vent support, LTVV VAP bundle - continue ceftriaxone and azithromycin total 5 days. Cultures reviewed and unremarkable so far - will diurese with IV lasix today - vent settings adjusted at bedside with RT- increased Vt to 7cc/kg and increased RR. Some improvement in ABG. Permissive hypercapnia   Septic Shock HTN -initially hypertensive 2/2 cocaine use - resulting in pulmonary edema. Will diurese today - holding home meds for BP - treatment of CAP as above,cultures no growth so far.  - shock resolved now.   Sedation for mechanical ventilation Substance abuse:  PSTD:  -  very challenging to sedate. Did not tolerate propofol due to Tgs. Did not do well on ketamine, dilaudid and precedex with scheuled opioids. Takes narcotics at homes and possibly other drugs, definitely cocaine.  - continue dilaudid and versed for now. Off ketamine and propofol. Will stop precedex and keep sedated today.  -avoid bb in setting of cocaine use -continue SSRI  T2dm with hyperglycemia - CBGs reviewed, improved control - continue glargine to 25 units BID - continue tube feed coverage, continue SSI - goal CBG 140-180   Best Practice  (right click and "Reselect all SmartList Selections" daily)   Diet/type: NPO, tube feeds DVT prophylaxis: LMWH GI prophylaxis: PPI Lines: N/A Foley:  Yes, and it is still needed Code Status:  full code Last date of multidisciplinary goals of care discussion [discussed with patient's daughter at bedside 12/25]  The patient is critically ill due to respiratory failure.  Critical care was necessary to treat or prevent imminent or life-threatening deterioration.  Critical care was time spent personally by me on the following activities: development of treatment plan with patient and/or surrogate as well as nursing, discussions with consultants, evaluation of patient's response to treatment, examination of patient, obtaining history from patient or surrogate, ordering and performing treatments and interventions, ordering and review of laboratory studies, ordering and review of radiographic studies, pulse oximetry, re-evaluation of patient's condition and participation in multidisciplinary rounds.   Critical Care Time devoted to patient care services described in this note is 36 minutes. This time reflects time of care of this [place] . This critical care time does not reflect separately billable procedures or procedure time, teaching time or supervisory time of PA/NP/Med student/Med Resident etc but could involve care discussion time.       Spero Geralds [facility] [facility] 10/03/2021 10:51 AM  Pager: see AMION  If no response to pager , please call critical care on call (see AMION) until 7pm After 7:00 pm call Elink     Labs   CBC: Recent Labs  Lab 09/30/21 2027 09/30/21 2036 10/01/21 0510 10/01/21 0533 10/02/21 0625 10/02/21 0641 10/02/21 0935 10/03/21 0432  WBC 12.8*  --  24.3*  --   --  20.3*  --  19.6*  NEUTROABS 9.4*  --   --   --   --   --   --   --   HGB 14.1   < > 13.3 13.9 11.9* 11.3* 11.2* 10.3*  HCT 43.8   < > 40.1 41.0 35.0* 35.3*  33.0* 32.1*  MCV 95.4  --  94.4  --   --  97.0  --  95.3  PLT 260  --  305  --   --  241  --  207   < > = values in this interval not displayed.    Basic Metabolic Panel: Recent Labs  Lab 09/30/21 2027 09/30/21 2036 10/01/21 0510 10/01/21 0533 10/01/21 1251 10/01/21 1622 10/02/21 0625 10/02/21 0641 10/02/21 0935 10/03/21 0432  NA 133*   < > 131* 134*  --   --  137 136 137 137  K 3.7   < > 4.3 4.4  --   --  4.8 4.8 4.5 3.9  CL 101  --  97*  --   --   --   --  102  --  106  CO2 22  --  24  --   --   --   --  27  --  26  GLUCOSE 256*  --  340*  --   --   --   --  190*  --  200*  BUN 15  --  18  --   --   --   --  33*  --  30*  CREATININE 1.04*  --  1.41*  --   --   --   --  1.42*  --  1.01*  CALCIUM 8.9  --  8.7*  --   --   --   --  8.4*  --  8.4*  MG  --   --  1.6*  --  1.9 1.9  --  2.3  --   --   PHOS  --   --  3.4  --  4.8* 5.8*  --  5.6*  --   --    < > = values in this interval not displayed.   GFR: Estimated Creatinine Clearance: 57.5 mL/min (A) (by C-G formula based on SCr of 1.01 mg/dL (H)). Recent Labs  Lab 09/30/21 2027 09/30/21 2032 10/01/21 0510 10/01/21 1428 10/02/21 0641 10/03/21 0432  PROCALCITON  --   --   --  59.18 35.38 19.03  WBC 12.8*  --  24.3*  --  20.3* 19.6*  LATICACIDVEN  --  3.0*  --   --   --   --     Liver Function Tests: Recent Labs  Lab 09/30/21 2027  AST 33  ALT 20  ALKPHOS 59  BILITOT 0.6  PROT 6.7  ALBUMIN 3.5   Recent Labs  Lab 09/30/21 2027  LIPASE 36   No results for input(s): AMMONIA in the last 168 hours.  ABG    Component Value Date/Time   PHART 7.315 (L) 10/02/2021 0935   PCO2ART 53.6 (H) 10/02/2021 0935   PO2ART 57 (L) 10/02/2021 0935   HCO3 27.2 10/02/2021 0935   TCO2 29 10/02/2021 0935   ACIDBASEDEF 1.0 10/02/2021 0625   O2SAT 85.0 10/02/2021 0935     Coagulation Profile: Recent Labs  Lab 09/30/21 2027  INR 0.9    Cardiac Enzymes: No results for input(s): CKTOTAL, CKMB, CKMBINDEX, TROPONINI in  the last 168 hours.  HbA1C: Hgb A1c MFr Bld  Date/Time Value Ref Range Status  10/01/2021 08:39 AM 9.1 (H) 4.8 - 5.6 % Final    Comment:    (NOTE) Pre diabetes:          5.7%-6.4%  Diabetes:              >6.4%  Glycemic control for   <7.0% adults with diabetes   09/11/2018 03:57 PM 7.6 (H) 4.8 - 5.6 % Final    Comment:             Prediabetes: 5.7 - 6.4          Diabetes: >6.4          Glycemic control for adults with diabetes: <7.0     CBG: Recent Labs  Lab 10/02/21 2050 10/02/21 2310 10/03/21 0021 10/03/21 0307 10/03/21 0712  GLUCAP 96 134* 173* 241* 190*

## 2021-10-04 ENCOUNTER — Inpatient Hospital Stay (HOSPITAL_COMMUNITY): Payer: Medicare HMO

## 2021-10-04 ENCOUNTER — Inpatient Hospital Stay: Payer: Self-pay

## 2021-10-04 DIAGNOSIS — J9601 Acute respiratory failure with hypoxia: Secondary | ICD-10-CM | POA: Diagnosis not present

## 2021-10-04 LAB — POCT I-STAT 7, (LYTES, BLD GAS, ICA,H+H)
Acid-Base Excess: 5 mmol/L — ABNORMAL HIGH (ref 0.0–2.0)
Bicarbonate: 32.7 mmol/L — ABNORMAL HIGH (ref 20.0–28.0)
Calcium, Ion: 1.21 mmol/L (ref 1.15–1.40)
HCT: 44 % (ref 36.0–46.0)
Hemoglobin: 15 g/dL (ref 12.0–15.0)
O2 Saturation: 93 %
Patient temperature: 99.7
Potassium: 3.3 mmol/L — ABNORMAL LOW (ref 3.5–5.1)
Sodium: 139 mmol/L (ref 135–145)
TCO2: 34 mmol/L — ABNORMAL HIGH (ref 22–32)
pCO2 arterial: 60.7 mmHg — ABNORMAL HIGH (ref 32.0–48.0)
pH, Arterial: 7.342 — ABNORMAL LOW (ref 7.350–7.450)
pO2, Arterial: 76 mmHg — ABNORMAL LOW (ref 83.0–108.0)

## 2021-10-04 LAB — CBC
HCT: 31.1 % — ABNORMAL LOW (ref 36.0–46.0)
Hemoglobin: 10.1 g/dL — ABNORMAL LOW (ref 12.0–15.0)
MCH: 30.9 pg (ref 26.0–34.0)
MCHC: 32.5 g/dL (ref 30.0–36.0)
MCV: 95.1 fL (ref 80.0–100.0)
Platelets: 204 10*3/uL (ref 150–400)
RBC: 3.27 MIL/uL — ABNORMAL LOW (ref 3.87–5.11)
RDW: 13.8 % (ref 11.5–15.5)
WBC: 16.9 10*3/uL — ABNORMAL HIGH (ref 4.0–10.5)
nRBC: 0 % (ref 0.0–0.2)

## 2021-10-04 LAB — BASIC METABOLIC PANEL
Anion gap: 7 (ref 5–15)
BUN: 25 mg/dL — ABNORMAL HIGH (ref 6–20)
CO2: 31 mmol/L (ref 22–32)
Calcium: 8.6 mg/dL — ABNORMAL LOW (ref 8.9–10.3)
Chloride: 101 mmol/L (ref 98–111)
Creatinine, Ser: 0.78 mg/dL (ref 0.44–1.00)
GFR, Estimated: >60 mL/min (ref >60)
Glucose, Bld: 108 mg/dL — ABNORMAL HIGH (ref 70–99)
Potassium: 3.4 mmol/L — ABNORMAL LOW (ref 3.5–5.1)
Sodium: 139 mmol/L (ref 135–145)

## 2021-10-04 LAB — GLUCOSE, CAPILLARY
Glucose-Capillary: 109 mg/dL — ABNORMAL HIGH (ref 70–99)
Glucose-Capillary: 153 mg/dL — ABNORMAL HIGH (ref 70–99)
Glucose-Capillary: 208 mg/dL — ABNORMAL HIGH (ref 70–99)
Glucose-Capillary: 187 mg/dL — ABNORMAL HIGH (ref 70–99)
Glucose-Capillary: 92 mg/dL (ref 70–99)
Glucose-Capillary: 69 mg/dL — ABNORMAL LOW (ref 70–99)
Glucose-Capillary: 159 mg/dL — ABNORMAL HIGH (ref 70–99)

## 2021-10-04 LAB — CULTURE, RESPIRATORY W GRAM STAIN: Culture: NORMAL

## 2021-10-04 LAB — TRIGLYCERIDES: Triglycerides: 112 mg/dL (ref <150)

## 2021-10-04 MED ORDER — PANTOPRAZOLE 2 MG/ML SUSPENSION
40.0000 mg | Freq: Every day | ORAL | Status: DC
  Administered 2021-10-04 – 2021-10-24 (×21): 40 mg
  Filled 2021-10-04 (×22): qty 20

## 2021-10-04 MED ORDER — HYDROCHLOROTHIAZIDE 12.5 MG PO TABS
12.5000 mg | ORAL_TABLET | Freq: Every day | ORAL | Status: DC
Start: 2021-10-04 — End: 2021-10-06
  Filled 2021-10-04 (×3): qty 1

## 2021-10-04 MED ORDER — LISINOPRIL 10 MG PO TABS
20.0000 mg | ORAL_TABLET | Freq: Every day | ORAL | Status: DC
  Filled 2021-10-04: qty 2

## 2021-10-04 MED ORDER — SODIUM CHLORIDE 0.9% FLUSH
10.0000 mL | Freq: Two times a day (BID) | INTRAVENOUS | Status: DC
  Administered 2021-10-04 – 2021-10-26 (×36): 10 mL

## 2021-10-04 MED ORDER — LISINOPRIL-HYDROCHLOROTHIAZIDE 20-12.5 MG PO TABS: 1.0000 | ORAL_TABLET | Freq: Every day | ORAL | Status: DC

## 2021-10-04 MED ORDER — POTASSIUM CHLORIDE 20 MEQ PO PACK
40.0000 meq | PACK | Freq: Once | ORAL | Status: AC
  Administered 2021-10-04: 06:00:00 40 meq
  Filled 2021-10-04: qty 2

## 2021-10-04 MED ORDER — SODIUM CHLORIDE 0.9% FLUSH: 10.0000 mL | INTRAVENOUS | Status: DC | PRN

## 2021-10-04 NOTE — Progress Notes (Signed)
RT attempted morning SBT with pt. Pt placed into CPAP/PS with the following settings:  +8 and 14 of PS above peep. Pt with low tidal volumes in the 200's and desaturation to 86%. Pt returned to full support settings at this time. RT will continue to monitor and be available as needed.

## 2021-10-04 NOTE — Progress Notes (Signed)
[facility] Progress Note Patient Name: [name] DOB: 10/12/1965 MRN: [id number]   Date of Service  10/04/2021  HPI/Events of Note  RN reported some wheezing and RT will be giving nebs as ordered. Request for AM ABG and CXR  eICU Interventions  Order placed     Intervention Category Major Interventions: Respiratory failure - evaluation and management  [name] 10/04/2021, 2:20 AM

## 2021-10-04 NOTE — Progress Notes (Signed)
NAME:  [name], MRN:  [id number], DOB:  1966/09/19, LOS: 4 ADMISSION DATE:  09/30/2021, CONSULTATION DATE:  09/30/21 REFERRING MD:  EDP, CHIEF COMPLAINT:  acute hypoxic resp failure   History of Present Illness:  55 yo black female with pmh asthma, dm2, hyperlipidemia, PTSD/anxiety and chronic pancreatitis presented via EMS on cpap machine with sats in the mid 60's. Pt is intubated and sedated so ROS and complete history are unobtainable at this time. All history is obtained from ED and chart review.   Per report pt was at home partaking in cocaine with her husband when she began having sudden and severe sob. She was altered, unable to hold herself up in bed, minimally responsive. She had minimal air movement while on cpap and decision was made to emergently intubate pt. CXR revealed pulmonary edema. BP was noted to be extremely elevated >630 systolic. She was started on nitro infusion.   CCM was consulted for admission 2/2 pt's intubated status.   Pertinent  Medical History  DM2 Asthma Hyperlipidemia PTSD, anxiety Chronic pancreatitis  Significant Hospital Events: Including procedures, antibiotic start and stop dates in addition to other pertinent events   12/23: intubated 12/24: febrile, abx started for CAP 12/25 struggled with sedation - failed ketamine, precedex, dilaudid. Febrile and cultured 12/27 on Dilaudid and Versed drip  Interim History / Subjective:  More comfortable on versed gtt this morning. Synchronous with ventilator Failed weaning this morning-desaturated, placed back on full vent support  Objective   Blood pressure 106/64, pulse 89, temperature 100 F (37.8 C), resp. rate (!) 30, height 5\' 6"  (1.676 m), weight 73.2 kg, last menstrual period 03/07/2012, SpO2 92 %.    Vent Mode: PRVC FiO2 (%):  [40 %] 40 % Set Rate:  [30 bmp] 30 bmp Vt Set:  [410 mL] 410 mL PEEP:  [8 cmH20] 8 cmH20 Plateau Pressure:  [22 cmH20-29 cmH20] 26 cmH20   Intake/Output Summary  (Last 24 hours) at 10/04/2021 0835 Last data filed at 10/04/2021 0700 Gross per 24 hour  Intake 2124.74 ml  Output 3115 ml  Net -990.26 ml   Filed Weights   10/02/21 0500 10/03/21 0347 10/04/21 0448  Weight: 73.3 kg 67.7 kg 73.2 kg    Examination: Gen:      Acutely ill-appearing HEENT: Endotracheal tube in place Lungs:    Mild wheezing CV:         S1-S2 appreciated Abd:      Bowel sounds appreciated Ext:    No edema Skin:      Warm and dry; no rashes Neuro:   sedated, RASS -3, moves all 4 extremities  Labs reviewed: ABG improved -7.34/61/76/34   Resolved Hospital Problem list     Assessment & Plan:   Acute hypoxic and hypercapnic respiratory failure-present on admission Acute pulmonary edema Community-acquired pneumonia -Continue full vent support, failed weaning this morning -Cautious diuresis  Septic shock Hypertension -Continue antibiotics -Home antihypertensives on hold  Sedation for mechanical ventilation Substance abuse PTSD -Tolerating sedation at present -Weaning as tolerated -Continue Dilaudid and Versed for now  Type 2 diabetes with hyperglycemia -Continue SSI -On glargine 25 units twice daily  Risk of decompensation remains very high Not ready for extubation as she failed weaning today 12/27  Best Practice (right click and "Reselect all SmartList Selections" daily)   Diet/type: tubefeeds DVT prophylaxis: LMWH GI prophylaxis: PPI Lines: N/A Foley:  Yes, and it is still needed Code Status:  full code Last date of multidisciplinary goals of care discussion [  discussed with patient's daughter at bedside 12/25]  Labs   CBC: Recent Labs  Lab 09/30/21 2027 09/30/21 2036 10/01/21 0510 10/01/21 0533 10/02/21 1517 10/02/21 0935 10/03/21 0432 10/04/21 0423 10/04/21 0427  WBC 12.8*  --  24.3*  --  20.3*  --  19.6* 16.9*  --   NEUTROABS 9.4*  --   --   --   --   --   --   --   --   HGB 14.1   < > 13.3   < > 11.3* 11.2* 10.3* 10.1* 15.0   HCT 43.8   < > 40.1   < > 35.3* 33.0* 32.1* 31.1* 44.0  MCV 95.4  --  94.4  --  97.0  --  95.3 95.1  --   PLT 260  --  305  --  241  --  207 204  --    < > = values in this interval not displayed.    Basic Metabolic Panel: Recent Labs  Lab 09/30/21 2027 09/30/21 2036 10/01/21 0510 10/01/21 0533 10/01/21 1251 10/01/21 1622 10/02/21 0625 10/02/21 0641 10/02/21 0935 10/03/21 0432 10/04/21 0423 10/04/21 0427  NA 133*   < > 131*   < >  --   --    < > 136 137 137 139 139  K 3.7   < > 4.3   < >  --   --    < > 4.8 4.5 3.9 3.4* 3.3*  CL 101  --  97*  --   --   --   --  102  --  106 101  --   CO2 22  --  24  --   --   --   --  27  --  26 31  --   GLUCOSE 256*  --  340*  --   --   --   --  190*  --  200* 108*  --   BUN 15  --  18  --   --   --   --  33*  --  30* 25*  --   CREATININE 1.04*  --  1.41*  --   --   --   --  1.42*  --  1.01* 0.78  --   CALCIUM 8.9  --  8.7*  --   --   --   --  8.4*  --  8.4* 8.6*  --   MG  --   --  1.6*  --  1.9 1.9  --  2.3  --   --   --   --   PHOS  --   --  3.4  --  4.8* 5.8*  --  5.6*  --   --   --   --    < > = values in this interval not displayed.   GFR: Estimated Creatinine Clearance: 79.5 mL/min (by C-G formula based on SCr of 0.78 mg/dL). Recent Labs  Lab 09/30/21 2032 10/01/21 0510 10/01/21 1428 10/02/21 0641 10/03/21 0432 10/04/21 0423  PROCALCITON  --   --  59.18 35.38 19.03  --   WBC  --  24.3*  --  20.3* 19.6* 16.9*  LATICACIDVEN 3.0*  --   --   --   --   --     Liver Function Tests: Recent Labs  Lab 09/30/21 2027  AST 33  ALT 20  ALKPHOS 59  BILITOT 0.6  PROT 6.7  ALBUMIN 3.5  Recent Labs  Lab 09/30/21 2027  LIPASE 36   No results for input(s): AMMONIA in the last 168 hours.  ABG    Component Value Date/Time   PHART 7.342 (L) 10/04/2021 0427   PCO2ART 60.7 (H) 10/04/2021 0427   PO2ART 76 (L) 10/04/2021 0427   HCO3 32.7 (H) 10/04/2021 0427   TCO2 34 (H) 10/04/2021 0427   ACIDBASEDEF 1.0 10/02/2021 0625    O2SAT 93.0 10/04/2021 0427     Coagulation Profile: Recent Labs  Lab 09/30/21 2027  INR 0.9    Cardiac Enzymes: No results for input(s): CKTOTAL, CKMB, CKMBINDEX, TROPONINI in the last 168 hours.  HbA1C: Hgb A1c MFr Bld  Date/Time Value Ref Range Status  10/01/2021 08:39 AM 9.1 (H) 4.8 - 5.6 % Final    Comment:    (NOTE) Pre diabetes:          5.7%-6.4%  Diabetes:              >6.4%  Glycemic control for   <7.0% adults with diabetes   09/11/2018 03:57 PM 7.6 (H) 4.8 - 5.6 % Final    Comment:             Prediabetes: 5.7 - 6.4          Diabetes: >6.4          Glycemic control for adults with diabetes: <7.0     CBG: Recent Labs  Lab 10/03/21 1551 10/03/21 1912 10/03/21 2310 10/04/21 0311 10/04/21 0732  GLUCAP 88 198* 145* 109* 153*   The patient is critically ill with multiple organ systems failure and requires high complexity decision making for assessment and support, frequent evaluation and titration of therapies, application of advanced monitoring technologies and extensive interpretation of multiple databases. Critical Care Time devoted to patient care services described in this note independent of APP/resident time (if applicable)  is 33 minutes.   Sherrilyn Rist MD [facility] Personal pager: See Amion If unanswered, please page CCM On-call: [phone]

## 2021-10-04 NOTE — Sepsis Progress Note (Deleted)
[facility] ADULT ICU REPLACEMENT PROTOCOL   The patient does apply for the [facility] Adult ICU Electrolyte Replacment Protocol based on the criteria listed below:   1.Exclusion criteria: TCTS patients, ECMO patients, and Dialysis patients 2. Is GFR >/= 30 ml/min? Yes.    Patient's GFR today is >60 3. Is SCr </= 2? Yes.   Patient's SCr is 0.78 mg/dL 4. Did SCr increase >/= 0.5 in 24 hours? Yes.   5.Pt's weight >40kg  Yes.   6. Abnormal electrolyte(s):  K 3.4  7. Electrolytes replaced per protocol 8.  Call MD STAT for K+ </= 2.5, Phos </= 1, or Mag </= 1 Physician:  Hulan Saas R Danaysha Kirn 10/04/2021 5:15 AM

## 2021-10-04 NOTE — Progress Notes (Signed)
Peripherally Inserted Central Catheter Placement  The IV Nurse has discussed with the patient and/or persons authorized to consent for the patient, the purpose of this procedure and the potential benefits and risks involved with this procedure.  The benefits include less needle sticks, lab draws from the catheter, and the patient may be discharged home with the catheter. Risks include, but not limited to, infection, bleeding, blood clot (thrombus formation), and puncture of an artery; nerve damage and irregular heartbeat and possibility to perform a PICC exchange if needed/ordered by physician.  Alternatives to this procedure were also discussed.  Bard Power PICC patient education guide, fact sheet on infection prevention and patient information card has been provided to patient /or left at bedside.    PICC Placement Documentation  PICC Double Lumen 56/38/75 PICC Right Basilic 39 cm 1 cm (Active)  Indication for Insertion or Continuance of Line Vasoactive infusions 10/04/21 1500  Exposed Catheter (cm) 1 cm 10/04/21 1500  Site Assessment Clean;Dry;Intact 10/04/21 1500  Lumen #1 Status Flushed;Blood return noted 10/04/21 1500  Lumen #2 Status Flushed;Blood return noted 10/04/21 1500  Dressing Type Transparent 10/04/21 1500  Dressing Status Clean;Dry;Intact 10/04/21 1500  Antimicrobial disc in place? Yes 10/04/21 1500  Dressing Change Due 10/11/21 10/04/21 1500    Telephone consent   Jule Economy Horton 10/04/2021, 3:55 PM

## 2021-10-04 NOTE — Progress Notes (Signed)
[facility] ADULT ICU REPLACEMENT PROTOCOL   The patient does apply for the [facility] Adult ICU Electrolyte Replacment Protocol based on the criteria listed below:   1.Exclusion criteria: TCTS patients, ECMO patients, and Dialysis patients 2. Is GFR >/= 30 ml/min? Yes.    Patient's GFR today is >60 3. Is SCr </= 2? Yes.   Patient's SCr is 0.78 mg/dL 4. Did SCr increase >/= 0.5 in 24 hours? No. 5.Pt's weight >40kg  Yes.   6. Abnormal electrolyte(s):  K 3.4  7. Electrolytes replaced per protocol 8.  Call MD STAT for K+ </= 2.5, Phos </= 1, or Mag </= 1 Physician:  Hulan Saas R Imara Standiford 10/04/2021 5:39 AM

## 2021-10-05 DIAGNOSIS — J9601 Acute respiratory failure with hypoxia: Secondary | ICD-10-CM | POA: Diagnosis not present

## 2021-10-05 LAB — CBC
HCT: 29.6 % — ABNORMAL LOW (ref 36.0–46.0)
Hemoglobin: 9.6 g/dL — ABNORMAL LOW (ref 12.0–15.0)
MCH: 30.4 pg (ref 26.0–34.0)
MCHC: 32.4 g/dL (ref 30.0–36.0)
MCV: 93.7 fL (ref 80.0–100.0)
Platelets: 209 10*3/uL (ref 150–400)
RBC: 3.16 MIL/uL — ABNORMAL LOW (ref 3.87–5.11)
RDW: 13.7 % (ref 11.5–15.5)
WBC: 16.3 10*3/uL — ABNORMAL HIGH (ref 4.0–10.5)
nRBC: 0 % (ref 0.0–0.2)

## 2021-10-05 LAB — BASIC METABOLIC PANEL WITH GFR
Anion gap: 10 (ref 5–15)
BUN: 23 mg/dL — ABNORMAL HIGH (ref 6–20)
CO2: 28 mmol/L (ref 22–32)
Calcium: 8.8 mg/dL — ABNORMAL LOW (ref 8.9–10.3)
Chloride: 102 mmol/L (ref 98–111)
Creatinine, Ser: 0.87 mg/dL (ref 0.44–1.00)
GFR, Estimated: >60 mL/min
Glucose, Bld: 198 mg/dL — ABNORMAL HIGH (ref 70–99)
Potassium: 3.8 mmol/L (ref 3.5–5.1)
Sodium: 140 mmol/L (ref 135–145)

## 2021-10-05 LAB — GLUCOSE, CAPILLARY
Glucose-Capillary: 195 mg/dL — ABNORMAL HIGH (ref 70–99)
Glucose-Capillary: 262 mg/dL — ABNORMAL HIGH (ref 70–99)
Glucose-Capillary: 186 mg/dL — ABNORMAL HIGH (ref 70–99)
Glucose-Capillary: 196 mg/dL — ABNORMAL HIGH (ref 70–99)
Glucose-Capillary: 196 mg/dL — ABNORMAL HIGH (ref 70–99)
Glucose-Capillary: 201 mg/dL — ABNORMAL HIGH (ref 70–99)

## 2021-10-05 NOTE — Progress Notes (Signed)
Nutrition Follow-up  DOCUMENTATION CODES:   Not applicable  INTERVENTION:   Continue TF via OG tube: Vital AF 1.2 at 60 ml/h (1440 ml per day)  Provides 1728 kcal, 108 gm protein, 1168 ml free water daily.  NUTRITION DIAGNOSIS:   Inadequate oral intake related to inability to eat as evidenced by NPO status.  Ongoing   GOAL:   Provide needs based on ASPEN/SCCM guidelines  Met with TF  MONITOR:   Labs, I & O's, Weight trends, TF tolerance, Vent status  REASON FOR ASSESSMENT:   Consult Enteral/tube feeding initiation and management  ASSESSMENT:   Pt with PMH significant for drug abuse, asthma, type 2 DM, HLD, PTSD/anxiety, and chronic pancreatitis admitted with acute hypoxic respiratory failure and acute pulmonary edema requiring intubation.  Discussed patient in ICU rounds and with RN today.  Patient failed her weaning trial this morning. Propofol is off.  Remains on versed and dilaudid.   Currently receiving Vital AF 1.2 at 60 ml/h. Tolerating without difficulty.   Patient remains intubated on ventilator support MV: 12.1 L/min Temp (24hrs), Avg:100.2 F (37.9 C), Min:99 F (37.2 C), Max:100.8 F (38.2 C)   Labs reviewed.  CBG: [phone]  Medications reviewed and include Novolog, Semglee, Protonix.  Holding bowel regimen today d/t type 7 stools.   Admission weight 76 kg Current weight 72.3 kg  I/O +3.6 L since admission  Diet Order:   Diet Order             Diet NPO time specified  Diet effective now                   EDUCATION NEEDS:   Not appropriate for education at this time  Skin:  Skin Assessment: Reviewed RN Assessment  Last BM:  12/28 type 7  Height:   Ht Readings from Last 1 Encounters:  10/02/21 _0  (1.676 m)    Weight:   Wt Readings from Last 1 Encounters:  10/05/21 72.3 kg    BMI:  Body mass index is 25.73 kg/m.  Estimated Nutritional Needs:   Kcal:  1820  Protein:  100-115 gm  Fluid:  >/= 2  L    Lucas Mallow, RD, LDN, CNSC Please refer to Amion for contact information.

## 2021-10-05 NOTE — Progress Notes (Signed)
NAME:  [name], MRN:  [id number], DOB:  May 24, 1966, LOS: 5 ADMISSION DATE:  09/30/2021, CONSULTATION DATE:  09/30/21 REFERRING MD:  EDP, CHIEF COMPLAINT:  acute hypoxic resp failure   History of Present Illness:  55 yo black female with pmh asthma, dm2, hyperlipidemia, PTSD/anxiety and chronic pancreatitis presented via EMS on cpap machine with sats in the mid 60's. Pt is intubated and sedated so ROS and complete history are unobtainable at this time. All history is obtained from ED and chart review.   Per report pt was at home partaking in cocaine with her husband when she began having sudden and severe sob. She was altered, unable to hold herself up in bed, minimally responsive. She had minimal air movement while on cpap and decision was made to emergently intubate pt. CXR revealed pulmonary edema. BP was noted to be extremely elevated >268 systolic. She was started on nitro infusion.   CCM was consulted for admission 2/2 pt's intubated status.   Pertinent  Medical History  DM2 Asthma Hyperlipidemia PTSD, anxiety Chronic pancreatitis  Significant Hospital Events: Including procedures, antibiotic start and stop dates in addition to other pertinent events   12/23: intubated 12/24: febrile, abx started for CAP 12/25 struggled with sedation - failed ketamine, precedex, dilaudid. Febrile and cultured 12/27 on Dilaudid and Versed drip  Interim History / Subjective:  Synchronous with ventilator No significant overnight events Still with some secretions  Objective   Blood pressure (!) 140/54, pulse 82, temperature (!) 100.8 F (38.2 C), temperature source Oral, resp. rate (!) 30, height 5\' 6"  (1.676 m), weight 72.3 kg, last menstrual period 03/07/2012, SpO2 97 %.    Vent Mode: PRVC FiO2 (%):  [40 %] 40 % Set Rate:  [30 bmp] 30 bmp Vt Set:  [410 mL] 410 mL PEEP:  [8 cmH20] 8 cmH20 Plateau Pressure:  [22 cmH20-29 cmH20] 22 cmH20   Intake/Output Summary (Last 24 hours) at  10/05/2021 0817 Last data filed at 10/05/2021 0800 Gross per 24 hour  Intake 2108.5 ml  Output 800 ml  Net 1308.5 ml   Filed Weights   10/03/21 0347 10/04/21 0448 10/05/21 0500  Weight: 67.7 kg 73.2 kg 72.3 kg    Examination: Gen:   Acutely ill-appearing HEENT: Endotracheal tube in place Lungs:    No wheezing, fair air entry CV:         S1-S2 appreciated Abd:      Bowel sounds appreciated Ext: No edema, no clubbing Skin:      Warm and dry; no rashes Neuro:   sedated, RASS -3, moves all 4 extremities  Labs reviewed: ABG improved -7.34/61/76/34 Chest x-ray 10/04/2021 reviewed by myself showing improved infiltrative process  Resolved Hospital Problem list     Assessment & Plan:   Acute hypoxic and hypercapnic respiratory failure-present on admission Acute pulmonary edema Community-acquired pneumonia -To continue full vent support -Driving pressure less than 15 -Cautious diuresis  Septic shock Hypotension -Continue antibiotics -Home antihypertensives resumed  History of substance abuse PTSD Sedation for tolerance of mechanical ventilation -Wean sedation as tolerated -Vent weaning as tolerated -On Dilaudid and Versed  Type 2 diabetes with hyperglycemia -SSI -Glargine 25 units twice daily  Leukocytosis -Appears to be stable -Respiratory cultures unrevealing -Blood cultures 12/25 negative -Stop azithromycin after dose today -Continue Rocephin  Acute kidney injury -Elevated creatinine -Resolving  Risk of decompensation remains high Requires critical care management We will aggressively try to wean today  Best Practice (right click and "Reselect all SmartList Selections" daily)  Diet/type: tubefeeds DVT prophylaxis: LMWH GI prophylaxis: PPI Lines: N/A Foley:  Yes, and it is still needed Code Status:  full code Last date of multidisciplinary goals of care discussion [discussed with patient's daughter at bedside 12/25]  Labs   CBC: Recent Labs   Lab 09/30/21 2027 09/30/21 2036 10/01/21 0510 10/01/21 0533 10/02/21 8250 10/02/21 0935 10/03/21 0432 10/04/21 0423 10/04/21 0427 10/05/21 0317  WBC 12.8*  --  24.3*  --  20.3*  --  19.6* 16.9*  --  16.3*  NEUTROABS 9.4*  --   --   --   --   --   --   --   --   --   HGB 14.1   < > 13.3   < > 11.3* 11.2* 10.3* 10.1* 15.0 9.6*  HCT 43.8   < > 40.1   < > 35.3* 33.0* 32.1* 31.1* 44.0 29.6*  MCV 95.4  --  94.4  --  97.0  --  95.3 95.1  --  93.7  PLT 260  --  305  --  241  --  207 204  --  209   < > = values in this interval not displayed.    Basic Metabolic Panel: Recent Labs  Lab 10/01/21 0510 10/01/21 0533 10/01/21 1251 10/01/21 1622 10/02/21 0370 10/02/21 0641 10/02/21 0935 10/03/21 0432 10/04/21 0423 10/04/21 0427 10/05/21 0317  NA 131*   < >  --   --    < > 136 137 137 139 139 140  K 4.3   < >  --   --    < > 4.8 4.5 3.9 3.4* 3.3* 3.8  CL 97*  --   --   --   --  102  --  106 101  --  102  CO2 24  --   --   --   --  27  --  26 31  --  28  GLUCOSE 340*  --   --   --   --  190*  --  200* 108*  --  198*  BUN 18  --   --   --   --  33*  --  30* 25*  --  23*  CREATININE 1.41*  --   --   --   --  1.42*  --  1.01* 0.78  --  0.87  CALCIUM 8.7*  --   --   --   --  8.4*  --  8.4* 8.6*  --  8.8*  MG 1.6*  --  1.9 1.9  --  2.3  --   --   --   --   --   PHOS 3.4  --  4.8* 5.8*  --  5.6*  --   --   --   --   --    < > = values in this interval not displayed.   GFR: Estimated Creatinine Clearance: 72.6 mL/min (by C-G formula based on SCr of 0.87 mg/dL). Recent Labs  Lab 09/30/21 2032 10/01/21 0510 10/01/21 1428 10/02/21 0641 10/03/21 0432 10/04/21 0423 10/05/21 0317  PROCALCITON  --   --  59.18 35.38 19.03  --   --   WBC  --    < >  --  20.3* 19.6* 16.9* 16.3*  LATICACIDVEN 3.0*  --   --   --   --   --   --    < > = values in this interval not displayed.  Liver Function Tests: Recent Labs  Lab 09/30/21 2027  AST 33  ALT 20  ALKPHOS 59  BILITOT 0.6  PROT 6.7   ALBUMIN 3.5   Recent Labs  Lab 09/30/21 2027  LIPASE 36   No results for input(s): AMMONIA in the last 168 hours.  ABG    Component Value Date/Time   PHART 7.342 (L) 10/04/2021 0427   PCO2ART 60.7 (H) 10/04/2021 0427   PO2ART 76 (L) 10/04/2021 0427   HCO3 32.7 (H) 10/04/2021 0427   TCO2 34 (H) 10/04/2021 0427   ACIDBASEDEF 1.0 10/02/2021 0625   O2SAT 93.0 10/04/2021 0427     Coagulation Profile: Recent Labs  Lab 09/30/21 2027  INR 0.9    Cardiac Enzymes: No results for input(s): CKTOTAL, CKMB, CKMBINDEX, TROPONINI in the last 168 hours.  HbA1C: Hgb A1c MFr Bld  Date/Time Value Ref Range Status  10/01/2021 08:39 AM 9.1 (H) 4.8 - 5.6 % Final    Comment:    (NOTE) Pre diabetes:          5.7%-6.4%  Diabetes:              >6.4%  Glycemic control for   <7.0% adults with diabetes   09/11/2018 03:57 PM 7.6 (H) 4.8 - 5.6 % Final    Comment:             Prediabetes: 5.7 - 6.4          Diabetes: >6.4          Glycemic control for adults with diabetes: <7.0     CBG: Recent Labs  Lab 10/04/21 1920 10/04/21 2118 10/04/21 2316 10/05/21 0311 10/05/21 0726  GLUCAP 92 69* 159* 195* 262*   The patient is critically ill with multiple organ systems failure and requires high complexity decision making for assessment and support, frequent evaluation and titration of therapies, application of advanced monitoring technologies and extensive interpretation of multiple databases. Critical Care Time devoted to patient care services described in this note independent of APP/resident time (if applicable)  is 32 minutes.   Sherrilyn Rist MD [facility] Personal pager: See Amion If unanswered, please page CCM On-call: [phone]

## 2021-10-05 NOTE — Progress Notes (Signed)
SBT attempted with pt this am. PT on CPAP/PS of 14/5. Pt returned to full support vent settings due to low Vt in the 200's on previus cpap settings. RT will continue to monitor and be available as needed.

## 2021-10-05 NOTE — Progress Notes (Signed)
Inpatient Diabetes Program Recommendations  AACE/ADA: New Consensus Statement on Inpatient Glycemic Control (2015)  Target Ranges:  Prepandial:   less than 140 mg/dL      Peak postprandial:   less than 180 mg/dL (1-2 hours)      Critically ill patients:  140 - 180 mg/dL   Lab Results  Component Value Date   GLUCAP 262 (H) 10/05/2021   HGBA1C 9.1 (H) 10/01/2021    Review of Glycemic Control  Latest Reference Range & Units 10/04/21 21:18 10/04/21 23:16 10/05/21 03:11 10/05/21 07:26  Glucose-Capillary 70 - 99 mg/dL 69 (L) 159 (H) 195 (H) 262 (H)  (L): Data is abnormally low (H): Data is abnormally high Diabetes history: Type 2 DM Outpatient Diabetes medications: Lantus 45 units qhs, Janumet 50-1000 mg bid Current orders for Inpatient glycemic control: Semglee 25 units bid, Novolog 6 units q4h, 0-20 q4h  Inpatient Diabetes Program Recommendations:   Noted mild low of 69 mg/dL. Consider reducing Novolog 0-15 units q4h.   Thanks, Bronson Curb, MSN, RNC-OB Diabetes Coordinator [phone] (8a-5p)

## 2021-10-06 ENCOUNTER — Inpatient Hospital Stay (HOSPITAL_COMMUNITY): Payer: Medicare HMO

## 2021-10-06 DIAGNOSIS — J9601 Acute respiratory failure with hypoxia: Secondary | ICD-10-CM | POA: Diagnosis not present

## 2021-10-06 LAB — CBC
HCT: 29.6 % — ABNORMAL LOW (ref 36.0–46.0)
Hemoglobin: 9.6 g/dL — ABNORMAL LOW (ref 12.0–15.0)
MCH: 30.5 pg (ref 26.0–34.0)
MCHC: 32.4 g/dL (ref 30.0–36.0)
MCV: 94 fL (ref 80.0–100.0)
Platelets: 224 10*3/uL (ref 150–400)
RBC: 3.15 MIL/uL — ABNORMAL LOW (ref 3.87–5.11)
RDW: 14.2 % (ref 11.5–15.5)
WBC: 19.5 10*3/uL — ABNORMAL HIGH (ref 4.0–10.5)
nRBC: 0.1 % (ref 0.0–0.2)

## 2021-10-06 LAB — GLUCOSE, CAPILLARY
Glucose-Capillary: 186 mg/dL — ABNORMAL HIGH (ref 70–99)
Glucose-Capillary: 171 mg/dL — ABNORMAL HIGH (ref 70–99)
Glucose-Capillary: 212 mg/dL — ABNORMAL HIGH (ref 70–99)
Glucose-Capillary: 214 mg/dL — ABNORMAL HIGH (ref 70–99)
Glucose-Capillary: 144 mg/dL — ABNORMAL HIGH (ref 70–99)
Glucose-Capillary: 152 mg/dL — ABNORMAL HIGH (ref 70–99)

## 2021-10-06 LAB — BASIC METABOLIC PANEL
Anion gap: 9 (ref 5–15)
BUN: 28 mg/dL — ABNORMAL HIGH (ref 6–20)
CO2: 28 mmol/L (ref 22–32)
Calcium: 8.8 mg/dL — ABNORMAL LOW (ref 8.9–10.3)
Chloride: 105 mmol/L (ref 98–111)
Creatinine, Ser: 0.82 mg/dL (ref 0.44–1.00)
GFR, Estimated: >60 mL/min (ref >60)
Glucose, Bld: 220 mg/dL — ABNORMAL HIGH (ref 70–99)
Potassium: 3.6 mmol/L (ref 3.5–5.1)
Sodium: 142 mmol/L (ref 135–145)

## 2021-10-06 MED ORDER — INSULIN ASPART 100 UNIT/ML IJ SOLN
3.0000 [IU] | INTRAMUSCULAR | Status: DC
Start: 2021-10-06 — End: 2021-10-07
  Administered 2021-10-06 – 2021-10-07 (×6): 3 [IU] via SUBCUTANEOUS

## 2021-10-06 MED ORDER — MIDAZOLAM BOLUS VIA INFUSION
1.0000 mg | INTRAVENOUS | Status: DC | PRN
  Administered 2021-10-06: 17:00:00 2 mg via INTRAVENOUS
  Administered 2021-10-06: 10:00:00 1 mg via INTRAVENOUS
  Administered 2021-10-07 – 2021-10-11 (×12): 2 mg via INTRAVENOUS
  Administered 2021-10-12 (×2): 1 mg via INTRAVENOUS
  Administered 2021-10-12: 2 mg via INTRAVENOUS
  Administered 2021-10-12 – 2021-10-13 (×3): 1 mg via INTRAVENOUS
  Administered 2021-10-13 – 2021-10-15 (×6): 2 mg via INTRAVENOUS
  Administered 2021-10-15 – 2021-10-16 (×4): 1 mg via INTRAVENOUS
  Administered 2021-10-17: 2 mg via INTRAVENOUS
  Filled 2021-10-06: qty 2

## 2021-10-06 MED ORDER — FUROSEMIDE 10 MG/ML IJ SOLN
40.0000 mg | Freq: Once | INTRAMUSCULAR | Status: AC
  Administered 2021-10-06: 10:00:00 40 mg via INTRAVENOUS
  Filled 2021-10-06: qty 4

## 2021-10-06 MED ORDER — ACETAMINOPHEN 160 MG/5ML PO SOLN
650.0000 mg | ORAL | Status: DC | PRN
  Administered 2021-10-08 – 2021-10-23 (×10): 650 mg
  Filled 2021-10-06 (×11): qty 20.3

## 2021-10-06 NOTE — Progress Notes (Signed)
SBT terminated due to increased RR in 40's. Patient agitated and RN gave sedation. RT placed patient on previous full support settings and is tolerating well at this time. RT will continue to monitor.

## 2021-10-06 NOTE — Progress Notes (Signed)
[facility] Progress Note Patient Name: [name] DOB: July 15, 1966 MRN: [id number]   Date of Service  10/06/2021  HPI/Events of Note  Febrile 103.1.Marland KitchenMarland Kitchenrec'd tylenol at 1900.  RN has ice packs on pt.  Last blood cx 12/25.    eICU Interventions  -cooling blanket.     Intervention Category Minor Interventions: Routine modifications to care plan (e.g. PRN medications for pain, fever)  Dayami Taitt V. Kahlyn Shippey 10/06/2021, 11:54 PM

## 2021-10-06 NOTE — Progress Notes (Signed)
NAME:  [name], MRN:  [id number], DOB:  19-Mar-1966, LOS: 6 ADMISSION DATE:  09/30/2021, CONSULTATION DATE:  09/30/21 REFERRING MD:  EDP, CHIEF COMPLAINT:  acute hypoxic resp failure   History of Present Illness:  55 yo black female with pmh asthma, dm2, hyperlipidemia, PTSD/anxiety and chronic pancreatitis presented via EMS on cpap machine with sats in the mid 60's. Pt is intubated and sedated so ROS and complete history are unobtainable at this time. All history is obtained from ED and chart review.   Per report pt was at home partaking in cocaine with her husband when she began having sudden and severe sob. She was altered, unable to hold herself up in bed, minimally responsive. She had minimal air movement while on cpap and decision was made to emergently intubate pt. CXR revealed pulmonary edema. BP was noted to be extremely elevated >188 systolic. She was started on nitro infusion.   CCM was consulted for admission 2/2 pt's intubated status.   Pertinent  Medical History  DM2 Asthma Hyperlipidemia PTSD, anxiety Chronic pancreatitis  Significant Hospital Events: Including procedures, antibiotic start and stop dates in addition to other pertinent events   12/23: intubated 12/24: febrile, abx started for CAP 12/25 struggled with sedation - failed ketamine, precedex, dilaudid. Febrile and cultured 12/27 on Dilaudid and Versed drip 12/29 failed weaning this morning with tachypnea  Interim History / Subjective:  Synchronous with ventilator No significant overnight events She is awake and alert, following commands She did get tachypneic with weaning  Objective   Blood pressure 130/61, pulse 90, temperature 100 F (37.8 C), temperature source Oral, resp. rate 16, height 5\' 6"  (1.676 m), weight 73.3 kg, last menstrual period 03/07/2012, SpO2 99 %.    Vent Mode: CPAP;PSV FiO2 (%):  [40 %] 40 % Set Rate:  [30 bmp] 30 bmp Vt Set:  [410 mL] 410 mL PEEP:  [5 cmH20] 5  cmH20 Pressure Support:  [15 cmH20-20 cmH20] 15 cmH20 Plateau Pressure:  [21 cmH20-25 cmH20] 22 cmH20   Intake/Output Summary (Last 24 hours) at 10/06/2021 0813 Last data filed at 10/06/2021 0700 Gross per 24 hour  Intake 2067.19 ml  Output 1250 ml  Net 817.19 ml   Filed Weights   10/04/21 0448 10/05/21 0500 10/06/21 0500  Weight: 73.2 kg 72.3 kg 73.3 kg    Examination: Gen:   Acutely ill-appearing HEENT: Endotracheal tube in place Lungs:    Fair air entry, mild wheezing CV:         S1-S2 appreciated Abd:      Bowel sounds appreciated Ext: No edema, no clubbing Skin:      Skin is warm and dry Neuro:   Moving all extremities, following commands  Labs reviewed: ABG improved -7.34/61/76/34 Chest x-ray 12/297/2022 reviewed by myself showing  She is 4.2 L positive  Resolved Hospital Problem list     Assessment & Plan:   Acute hypoxic and hypercapnic respiratory failure-present on admission Acute pulmonary edema Community-acquired pneumonia -Failed weaning this morning -Dose of Lasix today -Optimize fluid status  Community acquired pneumonia -Completed azithromycin -On Rocephin-stop Rocephin a day 7  Septic shock -Resolved  History of substance abuse PTSD -Wean sedation as tolerated -On Dilaudid and Versed  Type 2 diabetes with hyperglycemia -SSI  Leukocytosis -Stable Blood cultures 12/25 negative  Acute kidney injury -Elevated creatinine -Resolving  Risk of decompensation remains high Requires critical care management We will aggressively try to wean today -Continue to wean as tolerated -Spouse updated at bedside  Best  Practice (right click and "Reselect all SmartList Selections" daily)   Diet/type: tubefeeds DVT prophylaxis: LMWH GI prophylaxis: PPI Lines: N/A Foley:  Yes, and it is still needed Code Status:  full code Last date of multidisciplinary goals of care discussion [discussed with patient's daughter at bedside 12/25]  Labs    CBC: Recent Labs  Lab 09/30/21 2027 09/30/21 2036 10/02/21 0641 10/02/21 0935 10/03/21 0432 10/04/21 0423 10/04/21 0427 10/05/21 0317 10/06/21 0341  WBC 12.8*   < > 20.3*  --  19.6* 16.9*  --  16.3* 19.5*  NEUTROABS 9.4*  --   --   --   --   --   --   --   --   HGB 14.1   < > 11.3*   < > 10.3* 10.1* 15.0 9.6* 9.6*  HCT 43.8   < > 35.3*   < > 32.1* 31.1* 44.0 29.6* 29.6*  MCV 95.4   < > 97.0  --  95.3 95.1  --  93.7 94.0  PLT 260   < > 241  --  207 204  --  209 224   < > = values in this interval not displayed.    Basic Metabolic Panel: Recent Labs  Lab 10/01/21 0510 10/01/21 0533 10/01/21 1251 10/01/21 1622 10/02/21 7829 10/02/21 5621 10/02/21 0935 10/03/21 0432 10/04/21 0423 10/04/21 0427 10/05/21 0317 10/06/21 0341  NA 131*   < >  --   --    < > 136   < > 137 139 139 140 142  K 4.3   < >  --   --    < > 4.8   < > 3.9 3.4* 3.3* 3.8 3.6  CL 97*  --   --   --   --  102  --  106 101  --  102 105  CO2 24  --   --   --   --  27  --  26 31  --  28 28  GLUCOSE 340*  --   --   --   --  190*  --  200* 108*  --  198* 220*  BUN 18  --   --   --   --  33*  --  30* 25*  --  23* 28*  CREATININE 1.41*  --   --   --   --  1.42*  --  1.01* 0.78  --  0.87 0.82  CALCIUM 8.7*  --   --   --   --  8.4*  --  8.4* 8.6*  --  8.8* 8.8*  MG 1.6*  --  1.9 1.9  --  2.3  --   --   --   --   --   --   PHOS 3.4  --  4.8* 5.8*  --  5.6*  --   --   --   --   --   --    < > = values in this interval not displayed.   GFR: Estimated Creatinine Clearance: 77.6 mL/min (by C-G formula based on SCr of 0.82 mg/dL). Recent Labs  Lab 09/30/21 2032 10/01/21 0510 10/01/21 1428 10/02/21 0641 10/03/21 0432 10/04/21 0423 10/05/21 0317 10/06/21 0341  PROCALCITON  --   --  59.18 35.38 19.03  --   --   --   WBC  --    < >  --  20.3* 19.6* 16.9* 16.3* 19.5*  LATICACIDVEN 3.0*  --   --   --   --   --   --   --    < > =  values in this interval not displayed.    Liver Function Tests: Recent Labs  Lab  09/30/21 2027  AST 33  ALT 20  ALKPHOS 59  BILITOT 0.6  PROT 6.7  ALBUMIN 3.5   Recent Labs  Lab 09/30/21 2027  LIPASE 36   No results for input(s): AMMONIA in the last 168 hours.  ABG    Component Value Date/Time   PHART 7.342 (L) 10/04/2021 0427   PCO2ART 60.7 (H) 10/04/2021 0427   PO2ART 76 (L) 10/04/2021 0427   HCO3 32.7 (H) 10/04/2021 0427   TCO2 34 (H) 10/04/2021 0427   ACIDBASEDEF 1.0 10/02/2021 0625   O2SAT 93.0 10/04/2021 0427     Coagulation Profile: Recent Labs  Lab 09/30/21 2027  INR 0.9    Cardiac Enzymes: No results for input(s): CKTOTAL, CKMB, CKMBINDEX, TROPONINI in the last 168 hours.  HbA1C: Hgb A1c MFr Bld  Date/Time Value Ref Range Status  10/01/2021 08:39 AM 9.1 (H) 4.8 - 5.6 % Final    Comment:    (NOTE) Pre diabetes:          5.7%-6.4%  Diabetes:              >6.4%  Glycemic control for   <7.0% adults with diabetes   09/11/2018 03:57 PM 7.6 (H) 4.8 - 5.6 % Final    Comment:             Prediabetes: 5.7 - 6.4          Diabetes: >6.4          Glycemic control for adults with diabetes: <7.0     CBG: Recent Labs  Lab 10/05/21 1531 10/05/21 1931 10/05/21 2318 10/06/21 0319 10/06/21 0715  GLUCAP 196* 196* 201* 186* 171*   The patient is critically ill with multiple organ systems failure and requires high complexity decision making for assessment and support, frequent evaluation and titration of therapies, application of advanced monitoring technologies and extensive interpretation of multiple databases. Critical Care Time devoted to patient care services described in this note independent of APP/resident time (if applicable)  is 32 minutes.   Sherrilyn Rist MD [facility] Personal pager: See Amion If unanswered, please page CCM On-call: [phone]  Dr Kellie Shropshire is pain management doctor at [facility]

## 2021-10-07 ENCOUNTER — Inpatient Hospital Stay (HOSPITAL_COMMUNITY): Payer: Medicare HMO

## 2021-10-07 DIAGNOSIS — J9601 Acute respiratory failure with hypoxia: Secondary | ICD-10-CM | POA: Diagnosis not present

## 2021-10-07 LAB — CBC
HCT: 29.9 % — ABNORMAL LOW (ref 36.0–46.0)
Hemoglobin: 9.6 g/dL — ABNORMAL LOW (ref 12.0–15.0)
MCH: 30.5 pg (ref 26.0–34.0)
MCHC: 32.1 g/dL (ref 30.0–36.0)
MCV: 94.9 fL (ref 80.0–100.0)
Platelets: 227 10*3/uL (ref 150–400)
RBC: 3.15 MIL/uL — ABNORMAL LOW (ref 3.87–5.11)
RDW: 14.2 % (ref 11.5–15.5)
WBC: 19.5 10*3/uL — ABNORMAL HIGH (ref 4.0–10.5)
nRBC: 0.3 % — ABNORMAL HIGH (ref 0.0–0.2)

## 2021-10-07 LAB — POCT I-STAT 7, (LYTES, BLD GAS, ICA,H+H)
Acid-Base Excess: 4 mmol/L — ABNORMAL HIGH (ref 0.0–2.0)
Bicarbonate: 29.4 mmol/L — ABNORMAL HIGH (ref 20.0–28.0)
Calcium, Ion: 1.31 mmol/L (ref 1.15–1.40)
HCT: 32 % — ABNORMAL LOW (ref 36.0–46.0)
Hemoglobin: 10.9 g/dL — ABNORMAL LOW (ref 12.0–15.0)
O2 Saturation: 90 %
Patient temperature: 99
Potassium: 3.6 mmol/L (ref 3.5–5.1)
Sodium: 145 mmol/L (ref 135–145)
TCO2: 31 mmol/L (ref 22–32)
pCO2 arterial: 46.5 mmHg (ref 32.0–48.0)
pH, Arterial: 7.41 (ref 7.350–7.450)
pO2, Arterial: 59 mmHg — ABNORMAL LOW (ref 83.0–108.0)

## 2021-10-07 LAB — GLUCOSE, CAPILLARY
Glucose-Capillary: 164 mg/dL — ABNORMAL HIGH (ref 70–99)
Glucose-Capillary: 154 mg/dL — ABNORMAL HIGH (ref 70–99)
Glucose-Capillary: 144 mg/dL — ABNORMAL HIGH (ref 70–99)
Glucose-Capillary: 108 mg/dL — ABNORMAL HIGH (ref 70–99)
Glucose-Capillary: 180 mg/dL — ABNORMAL HIGH (ref 70–99)
Glucose-Capillary: 173 mg/dL — ABNORMAL HIGH (ref 70–99)

## 2021-10-07 LAB — CULTURE, BLOOD (ROUTINE X 2)
Culture: NO GROWTH
Culture: NO GROWTH
Special Requests: ADEQUATE

## 2021-10-07 LAB — BASIC METABOLIC PANEL
Anion gap: 8 (ref 5–15)
BUN: 27 mg/dL — ABNORMAL HIGH (ref 6–20)
CO2: 29 mmol/L (ref 22–32)
Calcium: 9 mg/dL (ref 8.9–10.3)
Chloride: 105 mmol/L (ref 98–111)
Creatinine, Ser: 0.85 mg/dL (ref 0.44–1.00)
GFR, Estimated: >60 mL/min (ref >60)
Glucose, Bld: 199 mg/dL — ABNORMAL HIGH (ref 70–99)
Potassium: 3.4 mmol/L — ABNORMAL LOW (ref 3.5–5.1)
Sodium: 142 mmol/L (ref 135–145)

## 2021-10-07 LAB — MAGNESIUM: Magnesium: 2.5 mg/dL — ABNORMAL HIGH (ref 1.7–2.4)

## 2021-10-07 MED ORDER — MIDAZOLAM-SODIUM CHLORIDE 100-0.9 MG/100ML-% IV SOLN: 0.5000 mg/h | INTRAVENOUS | Status: DC

## 2021-10-07 MED ORDER — ALBUTEROL SULFATE (2.5 MG/3ML) 0.083% IN NEBU
2.5000 mg | INHALATION_SOLUTION | RESPIRATORY_TRACT | Status: DC | PRN
  Administered 2021-10-07: 2.5 mg via RESPIRATORY_TRACT
  Filled 2021-10-07: qty 3

## 2021-10-07 MED ORDER — SUCCINYLCHOLINE CHLORIDE 200 MG/10ML IV SOSY
PREFILLED_SYRINGE | INTRAVENOUS | Status: AC
  Filled 2021-10-07: qty 10

## 2021-10-07 MED ORDER — SODIUM CHLORIDE 0.9 % IV SOLN
0.0000 mg/h | INTRAVENOUS | Status: DC
  Filled 2021-10-07: qty 5

## 2021-10-07 MED ORDER — MIDAZOLAM HCL 2 MG/2ML IJ SOLN: 2.0000 mg | Freq: Once | INTRAMUSCULAR | Status: DC

## 2021-10-07 MED ORDER — FENTANYL CITRATE (PF) 100 MCG/2ML IJ SOLN: 100.0000 ug | Freq: Once | INTRAMUSCULAR | Status: DC

## 2021-10-07 MED ORDER — RACEPINEPHRINE HCL 2.25 % IN NEBU
INHALATION_SOLUTION | RESPIRATORY_TRACT | Status: AC
  Filled 2021-10-07: qty 0.5

## 2021-10-07 MED ORDER — ALTEPLASE 2 MG IJ SOLR
2.0000 mg | Freq: Once | INTRAMUSCULAR | Status: AC
  Administered 2021-10-07: 18:00:00 2 mg
  Filled 2021-10-07: qty 2

## 2021-10-07 MED ORDER — METHYLPREDNISOLONE SODIUM SUCC 125 MG IJ SOLR
80.0000 mg | Freq: Once | INTRAMUSCULAR | Status: AC
  Administered 2021-10-07: 13:00:00 80 mg via INTRAVENOUS

## 2021-10-07 MED ORDER — ETOMIDATE 2 MG/ML IV SOLN
INTRAVENOUS | Status: AC
  Filled 2021-10-07: qty 20

## 2021-10-07 MED ORDER — ROCURONIUM BROMIDE 50 MG/5ML IV SOLN
50.0000 mg | Freq: Once | INTRAVENOUS | Status: AC
  Administered 2021-10-07: 14:00:00 50 mg via INTRAVENOUS

## 2021-10-07 MED ORDER — FENTANYL CITRATE (PF) 100 MCG/2ML IJ SOLN
INTRAMUSCULAR | Status: AC
  Administered 2021-10-07: 14:00:00 100 ug
  Filled 2021-10-07: qty 2

## 2021-10-07 MED ORDER — POTASSIUM CHLORIDE 20 MEQ PO PACK
40.0000 meq | PACK | Freq: Once | ORAL | Status: AC
  Administered 2021-10-07: 06:00:00 40 meq
  Filled 2021-10-07: qty 2

## 2021-10-07 MED ORDER — OXYCODONE HCL 5 MG/5ML PO SOLN
10.0000 mg | Freq: Four times a day (QID) | ORAL | Status: DC
  Administered 2021-10-07 – 2021-10-12 (×18): 10 mg
  Filled 2021-10-07 (×18): qty 10

## 2021-10-07 MED ORDER — INSULIN ASPART 100 UNIT/ML IJ SOLN
5.0000 [IU] | INTRAMUSCULAR | Status: DC
  Administered 2021-10-07 – 2021-10-12 (×19): 5 [IU] via SUBCUTANEOUS

## 2021-10-07 MED ORDER — KETAMINE HCL 50 MG/5ML IJ SOSY
PREFILLED_SYRINGE | INTRAMUSCULAR | Status: AC
  Filled 2021-10-07: qty 5

## 2021-10-07 MED ORDER — ALTEPLASE 2 MG IJ SOLR
2.0000 mg | Freq: Once | INTRAMUSCULAR | Status: AC
Start: 2021-10-07 — End: 2021-10-07
  Administered 2021-10-07: 23:00:00 2 mg
  Filled 2021-10-07: qty 2

## 2021-10-07 MED ORDER — ROCURONIUM BROMIDE 10 MG/ML (PF) SYRINGE
PREFILLED_SYRINGE | INTRAVENOUS | Status: AC
  Filled 2021-10-07: qty 10

## 2021-10-07 MED ORDER — MIDAZOLAM HCL 2 MG/2ML IJ SOLN
INTRAMUSCULAR | Status: AC
  Administered 2021-10-07: 14:00:00 2 mg
  Filled 2021-10-07: qty 2

## 2021-10-07 MED ORDER — METHYLPREDNISOLONE SODIUM SUCC 125 MG IJ SOLR
INTRAMUSCULAR | Status: AC
  Filled 2021-10-07: qty 2

## 2021-10-07 NOTE — Procedures (Addendum)
Intubation Procedure Note  [name]  [id number]  [birth year]  Date:10/07/21  Time:1:44 PM   Provider Performing:[name]    Procedure: Intubation (21224)  Indication(s) Respiratory Failure  Consent Unable to obtain consent due to emergent nature of procedure.   Anesthesia Versed, Fentanyl, and Rocuronium 100 fentanyl, 2 Versed, 50 of rocuronium  Time Out Verified patient identification, verified procedure, site/side was marked, verified correct patient position, special equipment/implants available, medications/allergies/relevant history reviewed, required imaging and test results available.   Sterile Technique Usual hand hygeine, masks, and gloves were used   Procedure Description Patient positioned in bed supine.  Sedation given as noted above.  Patient was intubated with endotracheal tube using Glidescope.  View was Grade 2 only posterior commissure .  Number of attempts was 1.  Colorimetric CO2 detector was consistent with tracheal placement.   Complications/Tolerance None; patient tolerated the procedure well. Chest X-ray is ordered to verify placement.   EBL none   Specimen(s) None

## 2021-10-07 NOTE — Progress Notes (Signed)
Patient failed extubation  Tachycardic, started desaturated, wheezy  Ordered 80 mg Solu-Medrol IV  Emergently reintubated with a size 7.5 endotracheal tube

## 2021-10-07 NOTE — Progress Notes (Signed)
patient emergently reintubated with 7.5 ETT at 24@ lip. ETC02 color change. BBS heard. Patient placed on previous full support vent settings.

## 2021-10-07 NOTE — Procedures (Signed)
Extubation Procedure Note  Patient Details:   Name: [name] DOB: [birth year] MRN: [id number]   Airway Documentation:    Vent end date: (not recorded) Vent end time: (not recorded)   Evaluation  O2 sats: stable throughout Complications: No apparent complications Patient did tolerate procedure well. Bilateral Breath Sounds: Diminished   Yes  Mcneil Sober 10/07/2021, 1:21 PM

## 2021-10-07 NOTE — Progress Notes (Signed)
Inserted TPA at 1815 in purple lumen. Only able to insert approx. 1 CC. Purple lumen very occluded.

## 2021-10-07 NOTE — Progress Notes (Signed)
[facility] Progress Note Patient Name: [name] DOB: 08/23/1966 MRN: [id number]   Date of Service  10/07/2021  HPI/Events of Note    eICU Interventions  Cath-flo ordered for clogged central catheter.        Kerry Kass Rolla Servidio 10/07/2021, 9:30 PM

## 2021-10-07 NOTE — Progress Notes (Signed)
NAME:  [name], MRN:  [id number], DOB:  [birth year], LOS: 7 ADMISSION DATE:  09/30/2021, CONSULTATION DATE:  09/30/21 REFERRING MD:  EDP, CHIEF COMPLAINT:  acute hypoxic resp failure   History of Present Illness:  55 yo black female with pmh asthma, dm2, hyperlipidemia, PTSD/anxiety and chronic pancreatitis presented via EMS on cpap machine with sats in the mid 60's. Pt is intubated and sedated so ROS and complete history are unobtainable at this time. All history is obtained from ED and chart review.   Per report pt was at home partaking in cocaine with her husband when she began having sudden and severe sob. She was altered, unable to hold herself up in bed, minimally responsive. She had minimal air movement while on cpap and decision was made to emergently intubate pt. CXR revealed pulmonary edema. BP was noted to be extremely elevated >660 systolic. She was started on nitro infusion.   CCM was consulted for admission 2/2 pt's intubated status.   Pertinent  Medical History  DM2 Asthma Hyperlipidemia PTSD, anxiety Chronic pancreatitis  Significant Hospital Events: Including procedures, antibiotic start and stop dates in addition to other pertinent events   12/23: intubated 12/24: febrile, abx started for CAP 12/25 struggled with sedation - failed ketamine, precedex, dilaudid. Febrile and cultured 12/27 on Dilaudid and Versed drip 12/29 failed weaning this morning with tachypnea  Interim History / Subjective:  Fever 103.1 yesterday No other significant events overnight She is awake, interactive, follows commands Will attempt weaning  Objective   Blood pressure 117/62, pulse 68, temperature 98.9 F (37.2 C), temperature source Oral, resp. rate (!) 30, height 5\' 6"  (1.676 m), weight 79.7 kg, last menstrual period 03/07/2012, SpO2 98 %.    Vent Mode: CPAP;PSV FiO2 (%):  [40 %] 40 % Set Rate:  [30 bmp] 30 bmp Vt Set:  [410 mL] 410 mL PEEP:  [5 cmH20] 5 cmH20 Pressure  Support:  [15 cmH20] 15 cmH20 Plateau Pressure:  [22 cmH20-25 cmH20] 25 cmH20   Intake/Output Summary (Last 24 hours) at 10/07/2021 1058 Last data filed at 10/07/2021 0900 Gross per 24 hour  Intake 1643.84 ml  Output 1700 ml  Net -56.16 ml   Filed Weights   10/05/21 0500 10/06/21 0500 10/07/21 0418  Weight: 72.3 kg 73.3 kg 79.7 kg    Examination: Gen:   Acutely ill-appearing HEENT: Endotracheal tube in place Lungs:    Mild wheezing, fair air entry CV:         S1-S2 appreciated Abd:      Bowel sounds appreciated Ext:  No clubbing, no edema Skin:      Skin is warm and dry Neuro:   Moving all extremities, following commands  Labs reviewed: ABG improved -7.34/61/76/34 Chest x-ray 12/297/2022 reviewed by myself showing  Leukocytosis is stable Did receive a dose of Lasix 12/29  Resolved Hospital Problem list     Assessment & Plan:   Acute hypoxemic and hypercapnic respiratory failure-present on admission Acute pulmonary edema Community-acquired pneumonia -Failed weaning 1229 -Weaning well today -Did receive Lasix 1229  Community-acquired pneumonia -Completed azithromycin -To complete Rocephin today  Septic shock -Resolved  History of polysubstance abuse PTSD -Wean sedation as tolerated -On Dilaudid and Versed  Type 2 diabetes with hyperglycemia -Continue SSI  Leukocytosis -Blood cultures 12/25 negative -Did have a fever of 103 overnight -Currently stable -We will monitor closely  Acute kidney injury -Elevated creatinine -Resolving  Risk of decompensation remains high Requires critical care management Continue to wean as tolerated  Best Practice (right click and "Reselect all SmartList Selections" daily)   Diet/type: tubefeeds DVT prophylaxis: LMWH GI prophylaxis: PPI Lines: N/A Foley:  Yes, and it is still needed Code Status:  full code Last date of multidisciplinary goals of care discussion [discussed with patient's daughter at bedside  12/25]  Labs   CBC: Recent Labs  Lab 09/30/21 2027 09/30/21 2036 10/03/21 0432 10/04/21 0423 10/04/21 0427 10/05/21 0317 10/06/21 0341 10/07/21 0410  WBC 12.8*   < > 19.6* 16.9*  --  16.3* 19.5* 19.5*  NEUTROABS 9.4*  --   --   --   --   --   --   --   HGB 14.1   < > 10.3* 10.1* 15.0 9.6* 9.6* 9.6*  HCT 43.8   < > 32.1* 31.1* 44.0 29.6* 29.6* 29.9*  MCV 95.4   < > 95.3 95.1  --  93.7 94.0 94.9  PLT 260   < > 207 204  --  209 224 227   < > = values in this interval not displayed.    Basic Metabolic Panel: Recent Labs  Lab 10/01/21 0510 10/01/21 0533 10/01/21 1251 10/01/21 1622 10/02/21 2952 10/02/21 8413 10/02/21 0935 10/03/21 0432 10/04/21 0423 10/04/21 0427 10/05/21 0317 10/06/21 0341 10/07/21 0410  NA 131*   < >  --   --    < > 136   < > 137 139 139 140 142 142  K 4.3   < >  --   --    < > 4.8   < > 3.9 3.4* 3.3* 3.8 3.6 3.4*  CL 97*  --   --   --   --  102  --  106 101  --  102 105 105  CO2 24  --   --   --   --  27  --  26 31  --  28 28 29   GLUCOSE 340*  --   --   --   --  190*  --  200* 108*  --  198* 220* 199*  BUN 18  --   --   --   --  33*  --  30* 25*  --  23* 28* 27*  CREATININE 1.41*  --   --   --   --  1.42*  --  1.01* 0.78  --  0.87 0.82 0.85  CALCIUM 8.7*  --   --   --   --  8.4*  --  8.4* 8.6*  --  8.8* 8.8* 9.0  MG 1.6*  --  1.9 1.9  --  2.3  --   --   --   --   --   --  2.5*  PHOS 3.4  --  4.8* 5.8*  --  5.6*  --   --   --   --   --   --   --    < > = values in this interval not displayed.   GFR: Estimated Creatinine Clearance: 77.8 mL/min (by C-G formula based on SCr of 0.85 mg/dL). Recent Labs  Lab 09/30/21 2032 10/01/21 0510 10/01/21 1428 10/02/21 0641 10/03/21 0432 10/04/21 0423 10/05/21 0317 10/06/21 0341 10/07/21 0410  PROCALCITON  --   --  59.18 35.38 19.03  --   --   --   --   WBC  --    < >  --  20.3* 19.6* 16.9* 16.3* 19.5* 19.5*  LATICACIDVEN 3.0*  --   --   --   --   --   --   --   --    < > =  values in this interval not  displayed.    Liver Function Tests: Recent Labs  Lab 09/30/21 2027  AST 33  ALT 20  ALKPHOS 59  BILITOT 0.6  PROT 6.7  ALBUMIN 3.5   Recent Labs  Lab 09/30/21 2027  LIPASE 36   No results for input(s): AMMONIA in the last 168 hours.  ABG    Component Value Date/Time   PHART 7.342 (L) 10/04/2021 0427   PCO2ART 60.7 (H) 10/04/2021 0427   PO2ART 76 (L) 10/04/2021 0427   HCO3 32.7 (H) 10/04/2021 0427   TCO2 34 (H) 10/04/2021 0427   ACIDBASEDEF 1.0 10/02/2021 0625   O2SAT 93.0 10/04/2021 0427     Coagulation Profile: Recent Labs  Lab 09/30/21 2027  INR 0.9    Cardiac Enzymes: No results for input(s): CKTOTAL, CKMB, CKMBINDEX, TROPONINI in the last 168 hours.  HbA1C: Hgb A1c MFr Bld  Date/Time Value Ref Range Status  10/01/2021 08:39 AM 9.1 (H) 4.8 - 5.6 % Final    Comment:    (NOTE) Pre diabetes:          5.7%-6.4%  Diabetes:              >6.4%  Glycemic control for   <7.0% adults with diabetes   09/11/2018 03:57 PM 7.6 (H) 4.8 - 5.6 % Final    Comment:             Prediabetes: 5.7 - 6.4          Diabetes: >6.4          Glycemic control for adults with diabetes: <7.0     CBG: Recent Labs  Lab 10/06/21 1514 10/06/21 1931 10/06/21 2321 10/07/21 0327 10/07/21 0717  GLUCAP 214* 144* 152* 164* 154*   The patient is critically ill with multiple organ systems failure and requires high complexity decision making for assessment and support, frequent evaluation and titration of therapies, application of advanced monitoring technologies and extensive interpretation of multiple databases. Critical Care Time devoted to patient care services described in this note independent of APP/resident time (if applicable)  is 35 minutes.   Sherrilyn Rist MD [facility] Personal pager: See Amion If unanswered, please page CCM On-call: [phone]  Dr Kellie Shropshire is pain management doctor at [facility]

## 2021-10-07 NOTE — Progress Notes (Signed)
[facility] ADULT ICU REPLACEMENT PROTOCOL   The patient does apply for the [facility] Adult ICU Electrolyte Replacment Protocol based on the criteria listed below:   1.Exclusion criteria: TCTS patients, ECMO patients, and Dialysis patients 2. Is GFR >/= 30 ml/min? Yes.    Patient's GFR today is >60 3. Is SCr </= 2? Yes.   Patient's SCr is 0.85 mg/dL 4. Did SCr increase >/= 0.5 in 24 hours? No. 5.Pt's weight >40kg  Yes.   6. Abnormal electrolyte(s):  K 3.4  7. Electrolytes replaced per protocol 8.  Call MD STAT for K+ </= 2.5, Phos </= 1, or Mag </= 1 Physician:  R. Staci Righter R Kania Regnier 10/07/2021 5:10 AM

## 2021-10-07 NOTE — Progress Notes (Addendum)
PICC de-clotting: Unable to withdraw tPA. Noted the previous VAST RN could only instill 52mL (half dose) due to occlusion. Recommend second dose. Order requested.  2245: No blood return from purple lumen. Second dose tPA instilled using push/pull technique due to occlusion/ resistance with push. Dead end cap applied. Will allow 2 hour dwell.

## 2021-10-08 DIAGNOSIS — J9601 Acute respiratory failure with hypoxia: Secondary | ICD-10-CM | POA: Diagnosis not present

## 2021-10-08 LAB — BASIC METABOLIC PANEL
Anion gap: 9 (ref 5–15)
BUN: 31 mg/dL — ABNORMAL HIGH (ref 6–20)
CO2: 28 mmol/L (ref 22–32)
Calcium: 9.3 mg/dL (ref 8.9–10.3)
Chloride: 106 mmol/L (ref 98–111)
Creatinine, Ser: 0.78 mg/dL (ref 0.44–1.00)
GFR, Estimated: >60 mL/min (ref >60)
Glucose, Bld: 224 mg/dL — ABNORMAL HIGH (ref 70–99)
Potassium: 3.8 mmol/L (ref 3.5–5.1)
Sodium: 143 mmol/L (ref 135–145)

## 2021-10-08 LAB — CULTURE, RESPIRATORY W GRAM STAIN: Culture: NORMAL

## 2021-10-08 LAB — CBC
HCT: 30.1 % — ABNORMAL LOW (ref 36.0–46.0)
Hemoglobin: 9.4 g/dL — ABNORMAL LOW (ref 12.0–15.0)
MCH: 30.1 pg (ref 26.0–34.0)
MCHC: 31.2 g/dL (ref 30.0–36.0)
MCV: 96.5 fL (ref 80.0–100.0)
Platelets: 253 10*3/uL (ref 150–400)
RBC: 3.12 MIL/uL — ABNORMAL LOW (ref 3.87–5.11)
RDW: 13.9 % (ref 11.5–15.5)
WBC: 22.7 10*3/uL — ABNORMAL HIGH (ref 4.0–10.5)
nRBC: 0.1 % (ref 0.0–0.2)

## 2021-10-08 LAB — GLUCOSE, CAPILLARY
Glucose-Capillary: 196 mg/dL — ABNORMAL HIGH (ref 70–99)
Glucose-Capillary: 204 mg/dL — ABNORMAL HIGH (ref 70–99)
Glucose-Capillary: 114 mg/dL — ABNORMAL HIGH (ref 70–99)
Glucose-Capillary: 157 mg/dL — ABNORMAL HIGH (ref 70–99)
Glucose-Capillary: 100 mg/dL — ABNORMAL HIGH (ref 70–99)
Glucose-Capillary: 141 mg/dL — ABNORMAL HIGH (ref 70–99)

## 2021-10-08 MED ORDER — WHITE PETROLATUM EX OINT
TOPICAL_OINTMENT | CUTANEOUS | Status: DC | PRN
  Filled 2021-10-08: qty 28.35

## 2021-10-08 MED ORDER — POTASSIUM CHLORIDE 20 MEQ PO PACK
40.0000 meq | PACK | Freq: Once | ORAL | Status: AC
  Administered 2021-10-08: 40 meq
  Filled 2021-10-08: qty 2

## 2021-10-08 MED ORDER — FUROSEMIDE 10 MG/ML IJ SOLN
40.0000 mg | Freq: Once | INTRAMUSCULAR | Status: AC
  Administered 2021-10-08: 40 mg via INTRAVENOUS
  Filled 2021-10-08: qty 4

## 2021-10-08 NOTE — Progress Notes (Addendum)
PICC declotting: No blood return from purple lumen after 2 hour dwell time on second dose tPA. Dead-end cap placed, will leave for longer dwell time.  0325: Brisk blood return from purple lumen. Cap changed. RN informed OK to use.

## 2021-10-08 NOTE — Progress Notes (Signed)
°   10/08/21 0725  Airway 7.5 mm  Placement Date/Time: 10/07/21 1344   Difficult airway due to:: Difficult airway - due to limited oral opening;Difficult airway - due to edematous airway  Grade View: Grade 1  Placed By: ICU physician  Airway Device: Endotracheal Tube  ETT Types: Oral ...  Secured at (cm) 24 cm  Measured From Lips  Secured Location Right  Secured By Actuary Repositioned Yes  Prone position No  Cuff Pressure (cm H2O) Clear OR 27-39 CmH2O  Site Condition Dry  Adult Ventilator Settings  Vent Type Servo i  Humidity HME  Vent Mode PRVC  Vt Set 410 mL  Set Rate 30 bmp  FiO2 (%) 60 %  I Time 0.8 Sec(s)  PEEP 5 cmH20  Adult Ventilator Measurements  Peak Airway Pressure 24 L/min  Mean Airway Pressure 12 cmH20  Plateau Pressure 21 cmH20  Resp Rate Spontaneous 0 br/min  Resp Rate Total 30 br/min  Exhaled Vt 407 mL  Measured Ve 11.6 mL  I:E Ratio Measured 1:1.5  Auto PEEP 0 cmH20  Total PEEP 5 cmH20  SpO2 93 %  Adult Ventilator Alarms  Alarms On Y  Ve High Alarm 20 L/min  Ve Low Alarm 7 L/min  Resp Rate High Alarm 40 br/min  Resp Rate Low Alarm 17  PEEP Low Alarm 3 cmH2O  Press High Alarm 50 cmH2O  Daily Weaning Assessment  Daily Assessment of Readiness to Wean Wean protocol criteria not met  Reason not met Fi02 > 40%  Breath Sounds  Bilateral Breath Sounds Diminished  Airway Suctioning/Secretions  Suction Type ETT  Suction Device  Catheter  Secretion Amount Small  Secretion Color Tan  Secretion Consistency Thick  Suction Tolerance Tolerated well  Suctioning Adverse Effects None

## 2021-10-08 NOTE — Progress Notes (Signed)
NAME:  [name], MRN:  [id number], DOB:  10/19/1965, LOS: 8 ADMISSION DATE:  09/30/2021, CONSULTATION DATE:  09/30/21 REFERRING MD:  EDP, CHIEF COMPLAINT:  acute hypoxic resp failure   History of Present Illness:  55 yo black female with pmh asthma, dm2, hyperlipidemia, PTSD/anxiety and chronic pancreatitis presented via EMS on cpap machine with sats in the mid 60's. Pt is intubated and sedated so ROS and complete history are unobtainable at this time. All history is obtained from ED and chart review.   Per report pt was at home partaking in cocaine with her husband when she began having sudden and severe sob. She was altered, unable to hold herself up in bed, minimally responsive. She had minimal air movement while on cpap and decision was made to emergently intubate pt. CXR revealed pulmonary edema. BP was noted to be extremely elevated >867 systolic. She was started on nitro infusion.   CCM was consulted for admission 2/2 pt's intubated status.    Pertinent  Medical History  DM2 Asthma Hyperlipidemia PTSD, anxiety Chronic pancreatitis  Significant Hospital Events: Including procedures, antibiotic start and stop dates in addition to other pertinent events   12/23: intubated 12/24: febrile, abx started for CAP 12/25 struggled with sedation - failed ketamine, precedex, dilaudid. Febrile and cultured 12/27 on Dilaudid and Versed drip 12/29 failed weaning this morning with tachypnea 12/31 was extubated 1230, failed extubation within an hour with bronchospasms, stridorous breathing.  Interim History / Subjective:  Had fever 103.1 on 12/29.  No recurrent fevers No other significant events overnight Awake and interactive, following commands this morning  Objective   Blood pressure (!) 99/57, pulse 66, temperature 98.6 F (37 C), temperature source Rectal, resp. rate (!) 30, height 5\' 6"  (1.676 m), weight 76.3 kg, last menstrual period 03/07/2012, SpO2 93 %.    Vent Mode:  PSV;CPAP FiO2 (%):  [40 %-60 %] 40 % Set Rate:  [30 bmp] 30 bmp Vt Set:  [410 mL] 410 mL PEEP:  [5 cmH20] 5 cmH20 Pressure Support:  [10 cmH20-15 cmH20] 10 cmH20 Plateau Pressure:  [18 cmH20-26 cmH20] 21 cmH20   Intake/Output Summary (Last 24 hours) at 10/08/2021 0830 Last data filed at 10/08/2021 0700 Gross per 24 hour  Intake 1597.8 ml  Output 1200 ml  Net 397.8 ml   Filed Weights   10/06/21 0500 10/07/21 0418 10/08/21 0420  Weight: 73.3 kg 79.7 kg 76.3 kg    Examination: Gen:   Acutely ill-appearing HEENT: Endotracheal tube in place Lungs:    Mild wheezing, decreased air movement bilaterally  CV:         S1-S2 appreciated Abd:      Bowel sounds appreciated Ext: No clubbing, no edema Skin:      Skin is warm and dry Neuro:   Moving all extremities, following commands  Labs reviewed: Labs reviewed-BUN 31, creatinine 0.8 Chest x-ray 10/07/2021 reviewed by myself showing-bilateral airspace disease Leukocytosis is stable-22.7.  Completed azithromycin and Rocephin Did receive a dose of Lasix 12/29  Resolved Hospital Problem list     Assessment & Plan:   Acute hypoxemic and hypercapnic respiratory failure -Present on admission Acute pulmonary edema Community-acquired pneumonia -Failed extubation 12/30 -We will continue to attempt to wean -Not ready for extubation  Community-acquired pneumonia -Completed course of Rocephin and azithromycin  Septic shock -Resolved  History of polysubstance abuse PTSD -Wean sedation as tolerated -On Dilaudid and Versed  Type 2 diabetes with hyperglycemia -Continue SSI  Leukocytosis -Blood cultures 12/25 negative -Fever  12/29, has been afebrile -We will continue to monitor  Acute kidney injury -Creatinine improving  She did fail extubation 12/30 Did not have a good cuff leak, also did have significant bronchospasms We will continue to monitor Reassess for extubation in 24 hours Obtain tracheal aspirate for  cultures Follow-up chest x-ray and labs in a.m.  Best Practice (right click and "Reselect all SmartList Selections" daily)   Diet/type: tubefeeds DVT prophylaxis: LMWH GI prophylaxis: PPI Lines: N/A Foley:  Yes, and it is still needed Code Status:  full code Last date of multidisciplinary goals of care discussion [discussed with patient's daughter at bedside 12/25]  Labs   CBC: Recent Labs  Lab 10/04/21 0423 10/04/21 0427 10/05/21 0317 10/06/21 0341 10/07/21 0410 10/07/21 1459 10/08/21 0412  WBC 16.9*  --  16.3* 19.5* 19.5*  --  22.7*  HGB 10.1*   < > 9.6* 9.6* 9.6* 10.9* 9.4*  HCT 31.1*   < > 29.6* 29.6* 29.9* 32.0* 30.1*  MCV 95.1  --  93.7 94.0 94.9  --  96.5  PLT 204  --  209 224 227  --  253   < > = values in this interval not displayed.    Basic Metabolic Panel: Recent Labs  Lab 10/01/21 1251 10/01/21 1622 10/02/21 0625 10/02/21 3419 10/02/21 0935 10/04/21 0423 10/04/21 0427 10/05/21 0317 10/06/21 0341 10/07/21 0410 10/07/21 1459 10/08/21 0412  NA  --   --    < > 136   < > 139   < > 140 142 142 145 143  K  --   --    < > 4.8   < > 3.4*   < > 3.8 3.6 3.4* 3.6 3.8  CL  --   --   --  102   < > 101  --  102 105 105  --  106  CO2  --   --   --  27   < > 31  --  28 28 29   --  28  GLUCOSE  --   --   --  190*   < > 108*  --  198* 220* 199*  --  224*  BUN  --   --   --  33*   < > 25*  --  23* 28* 27*  --  31*  CREATININE  --   --   --  1.42*   < > 0.78  --  0.87 0.82 0.85  --  0.78  CALCIUM  --   --   --  8.4*   < > 8.6*  --  8.8* 8.8* 9.0  --  9.3  MG 1.9 1.9  --  2.3  --   --   --   --   --  2.5*  --   --   PHOS 4.8* 5.8*  --  5.6*  --   --   --   --   --   --   --   --    < > = values in this interval not displayed.   GFR: Estimated Creatinine Clearance: 81 mL/min (by C-G formula based on SCr of 0.78 mg/dL). Recent Labs  Lab 10/01/21 1428 10/02/21 0641 10/02/21 0641 10/03/21 0432 10/04/21 0423 10/05/21 0317 10/06/21 0341 10/07/21 0410  10/08/21 0412  PROCALCITON 59.18 35.38  --  19.03  --   --   --   --   --   WBC  --  20.3*   < >  19.6*   < > 16.3* 19.5* 19.5* 22.7*   < > = values in this interval not displayed.    Liver Function Tests: No results for input(s): AST, ALT, ALKPHOS, BILITOT, PROT, ALBUMIN in the last 168 hours.  No results for input(s): LIPASE, AMYLASE in the last 168 hours.  No results for input(s): AMMONIA in the last 168 hours.  ABG    Component Value Date/Time   PHART 7.410 10/07/2021 1459   PCO2ART 46.5 10/07/2021 1459   PO2ART 59 (L) 10/07/2021 1459   HCO3 29.4 (H) 10/07/2021 1459   TCO2 31 10/07/2021 1459   ACIDBASEDEF 1.0 10/02/2021 0625   O2SAT 90.0 10/07/2021 1459     Coagulation Profile: No results for input(s): INR, PROTIME in the last 168 hours.   Cardiac Enzymes: No results for input(s): CKTOTAL, CKMB, CKMBINDEX, TROPONINI in the last 168 hours.  HbA1C: Hgb A1c MFr Bld  Date/Time Value Ref Range Status  10/01/2021 08:39 AM 9.1 (H) 4.8 - 5.6 % Final    Comment:    (NOTE) Pre diabetes:          5.7%-6.4%  Diabetes:              >6.4%  Glycemic control for   <7.0% adults with diabetes   09/11/2018 03:57 PM 7.6 (H) 4.8 - 5.6 % Final    Comment:             Prediabetes: 5.7 - 6.4          Diabetes: >6.4          Glycemic control for adults with diabetes: <7.0     CBG: Recent Labs  Lab 10/07/21 1530 10/07/21 1927 10/07/21 2310 10/08/21 0333 10/08/21 0713  GLUCAP 108* 180* 173* 196* 204*   The patient is critically ill with multiple organ systems failure and requires high complexity decision making for assessment and support, frequent evaluation and titration of therapies, application of advanced monitoring technologies and extensive interpretation of multiple databases. Critical Care Time devoted to patient care services described in this note independent of APP/resident time (if applicable)  is 32 minutes.   Sherrilyn Rist MD [place] [facility] Personal pager: See Amion If unanswered, please page CCM On-call: [phone]  Dr Kellie Shropshire is pain management doctor at [facility]

## 2021-10-08 NOTE — Progress Notes (Signed)
[place] Progress Note Patient Name: [name] DOB: July 12, 1966 MRN: [id number]   Date of Service  10/08/2021  HPI/Events of Note  Patient is mechanically ventilated and needs wrist restraints order renewed to prevent self-extubation.  eICU Interventions  Restraints order renewed.        Kerry Kass Emony Dormer 10/08/2021, 6:28 AM

## 2021-10-09 ENCOUNTER — Inpatient Hospital Stay (HOSPITAL_COMMUNITY): Payer: Medicare HMO

## 2021-10-09 DIAGNOSIS — J9601 Acute respiratory failure with hypoxia: Secondary | ICD-10-CM | POA: Diagnosis not present

## 2021-10-09 LAB — CBC WITH DIFFERENTIAL/PLATELET
Abs Immature Granulocytes: 0 K/uL (ref 0.00–0.07)
Basophils Absolute: 0 K/uL (ref 0.0–0.1)
Basophils Relative: 0 %
Eosinophils Absolute: 0.2 K/uL (ref 0.0–0.5)
Eosinophils Relative: 1 %
HCT: 31.1 % — ABNORMAL LOW (ref 36.0–46.0)
Hemoglobin: 9.6 g/dL — ABNORMAL LOW (ref 12.0–15.0)
Lymphocytes Relative: 24 %
Lymphs Abs: 5.3 K/uL — ABNORMAL HIGH (ref 0.7–4.0)
MCH: 30 pg (ref 26.0–34.0)
MCHC: 30.9 g/dL (ref 30.0–36.0)
MCV: 97.2 fL (ref 80.0–100.0)
Monocytes Absolute: 1.1 K/uL — ABNORMAL HIGH (ref 0.1–1.0)
Monocytes Relative: 5 %
Neutro Abs: 15.4 K/uL — ABNORMAL HIGH (ref 1.7–7.7)
Neutrophils Relative %: 70 %
Platelets: 296 K/uL (ref 150–400)
RBC: 3.2 MIL/uL — ABNORMAL LOW (ref 3.87–5.11)
RDW: 14 % (ref 11.5–15.5)
WBC: 22 K/uL — ABNORMAL HIGH (ref 4.0–10.5)
nRBC: 0 /100{WBCs}
nRBC: 0.1 % (ref 0.0–0.2)

## 2021-10-09 LAB — GLUCOSE, CAPILLARY
Glucose-Capillary: 117 mg/dL — ABNORMAL HIGH (ref 70–99)
Glucose-Capillary: 90 mg/dL (ref 70–99)
Glucose-Capillary: 135 mg/dL — ABNORMAL HIGH (ref 70–99)
Glucose-Capillary: 91 mg/dL (ref 70–99)
Glucose-Capillary: 87 mg/dL (ref 70–99)

## 2021-10-09 LAB — BASIC METABOLIC PANEL
Anion gap: 9 (ref 5–15)
BUN: 30 mg/dL — ABNORMAL HIGH (ref 6–20)
CO2: 30 mmol/L (ref 22–32)
Calcium: 9.5 mg/dL (ref 8.9–10.3)
Chloride: 107 mmol/L (ref 98–111)
Creatinine, Ser: 0.79 mg/dL (ref 0.44–1.00)
GFR, Estimated: >60 mL/min (ref >60)
Glucose, Bld: 127 mg/dL — ABNORMAL HIGH (ref 70–99)
Potassium: 3.7 mmol/L (ref 3.5–5.1)
Sodium: 146 mmol/L — ABNORMAL HIGH (ref 135–145)

## 2021-10-09 LAB — POCT I-STAT 7, (LYTES, BLD GAS, ICA,H+H)
Acid-Base Excess: 4 mmol/L — ABNORMAL HIGH (ref 0.0–2.0)
Bicarbonate: 27.9 mmol/L (ref 20.0–28.0)
Calcium, Ion: 1.31 mmol/L (ref 1.15–1.40)
HCT: 34 % — ABNORMAL LOW (ref 36.0–46.0)
Hemoglobin: 11.6 g/dL — ABNORMAL LOW (ref 12.0–15.0)
O2 Saturation: 88 %
Patient temperature: 99
Potassium: 3.5 mmol/L (ref 3.5–5.1)
Sodium: 147 mmol/L — ABNORMAL HIGH (ref 135–145)
TCO2: 29 mmol/L (ref 22–32)
pCO2 arterial: 38.2 mmHg (ref 32.0–48.0)
pH, Arterial: 7.472 — ABNORMAL HIGH (ref 7.350–7.450)
pO2, Arterial: 51 mmHg — ABNORMAL LOW (ref 83.0–108.0)

## 2021-10-09 MED ORDER — INSULIN GLARGINE-YFGN 100 UNIT/ML ~~LOC~~ SOLN
15.0000 [IU] | Freq: Two times a day (BID) | SUBCUTANEOUS | Status: DC
  Administered 2021-10-09: 15 [IU] via SUBCUTANEOUS
  Filled 2021-10-09 (×3): qty 0.15

## 2021-10-09 MED ORDER — POTASSIUM CHLORIDE 20 MEQ PO PACK
40.0000 meq | PACK | Freq: Once | ORAL | Status: AC
  Administered 2021-10-09: 40 meq
  Filled 2021-10-09: qty 2

## 2021-10-09 MED ORDER — DEXMEDETOMIDINE HCL IN NACL 400 MCG/100ML IV SOLN
0.4000 ug/kg/h | INTRAVENOUS | Status: DC
  Administered 2021-10-09: 1 ug/kg/h via INTRAVENOUS
  Administered 2021-10-09: 0.8 ug/kg/h via INTRAVENOUS
  Administered 2021-10-09 (×2): 1.2 ug/kg/h via INTRAVENOUS
  Administered 2021-10-10 – 2021-10-11 (×6): 1 ug/kg/h via INTRAVENOUS
  Administered 2021-10-11: 1.2 ug/kg/h via INTRAVENOUS
  Administered 2021-10-11: 1 ug/kg/h via INTRAVENOUS
  Administered 2021-10-11 – 2021-10-12 (×6): 1.2 ug/kg/h via INTRAVENOUS
  Filled 2021-10-09 (×18): qty 100

## 2021-10-09 MED ORDER — SODIUM CHLORIDE 0.9 % IV SOLN
2.0000 g | Freq: Three times a day (TID) | INTRAVENOUS | Status: AC
  Administered 2021-10-09 – 2021-10-13 (×13): 2 g via INTRAVENOUS
  Filled 2021-10-09 (×14): qty 2

## 2021-10-09 MED ORDER — VANCOMYCIN HCL 750 MG/150ML IV SOLN
750.0000 mg | Freq: Two times a day (BID) | INTRAVENOUS | Status: DC
  Administered 2021-10-10: 750 mg via INTRAVENOUS
  Filled 2021-10-09 (×2): qty 150

## 2021-10-09 MED ORDER — VANCOMYCIN HCL 1500 MG/300ML IV SOLN
1500.0000 mg | Freq: Once | INTRAVENOUS | Status: AC
  Administered 2021-10-10: 1500 mg via INTRAVENOUS
  Filled 2021-10-09: qty 300

## 2021-10-09 MED ORDER — ONDANSETRON HCL 4 MG/2ML IJ SOLN
4.0000 mg | Freq: Three times a day (TID) | INTRAMUSCULAR | Status: DC | PRN
  Administered 2021-10-10 – 2021-10-13 (×3): 4 mg via INTRAVENOUS
  Filled 2021-10-09 (×3): qty 2

## 2021-10-09 MED ORDER — FUROSEMIDE 10 MG/ML IJ SOLN
20.0000 mg | Freq: Once | INTRAMUSCULAR | Status: AC
  Administered 2021-10-09: 20 mg via INTRAVENOUS
  Filled 2021-10-09: qty 2

## 2021-10-09 NOTE — Progress Notes (Signed)
Pulled ET tube back 2 cm from 24 to 22 per order from Laurin Coder, MD

## 2021-10-09 NOTE — Progress Notes (Signed)
°   10/09/21 0736  Airway 7.5 mm  Placement Date/Time: 10/07/21 1344   Difficult airway due to:: Difficult airway - due to limited oral opening;Difficult airway - due to edematous airway  Grade View: Grade 1  Placed By: ICU physician  Airway Device: Endotracheal Tube  ETT Types: Oral ...  Secured at (cm) 24 cm  Measured From Lips  Secured Location Right  Secured By Actuary Repositioned Yes  Prone position No  Cuff Pressure (cm H2O) Clear OR 27-39 CmH2O  Site Condition Dry  Adult Ventilator Settings  Vent Type Servo i  Humidity HME  Vent Mode PRVC  Vt Set 410 mL  Set Rate 30 bmp  FiO2 (%) 60 %  I Time 0.8 Sec(s)  PEEP 5 cmH20  Adult Ventilator Measurements  Peak Airway Pressure 31 L/min  Mean Airway Pressure 15 cmH20  Plateau Pressure 20 cmH20  Resp Rate Spontaneous 0 br/min  Resp Rate Total 30 br/min  Exhaled Vt 411 mL  Measured Ve 12.3 mL  I:E Ratio Measured 1:1.5  Auto PEEP 0 cmH20  Total PEEP 5 cmH20  SpO2 95 %  Adult Ventilator Alarms  Alarms On Y  Ve High Alarm 20 L/min  Ve Low Alarm 4 L/min  Resp Rate High Alarm 38 br/min  Resp Rate Low Alarm 20  PEEP Low Alarm 3 cmH2O  Press High Alarm 45 cmH2O  Daily Weaning Assessment  Daily Assessment of Readiness to Wean Wean protocol criteria not met  Reason not met Fi02 > 40%  Breath Sounds  Bilateral Breath Sounds Diminished  Airway Suctioning/Secretions  Suction Type ETT  Suction Device  Catheter  Secretion Amount Moderate  Secretion Color Tan  Secretion Consistency Thick  Suction Tolerance Tolerated well  Suctioning Adverse Effects None

## 2021-10-09 NOTE — Progress Notes (Signed)
Pharmacy Electrolyte Replacement  Recent Labs:  Recent Labs    10/07/21 0410 10/07/21 1459 10/09/21 0324  K 3.4*   < > 3.7  MG 2.5*  --   --   CREATININE 0.85   < > 0.79   < > = values in this interval not displayed.    Low Critical Values (K </= 2.5, Phos </= 1, Mg </= 1) Present: None  MD Contacted: N/A  Plan: KCL 40 mEq per tube x1. Recheck in AM.   Sloan Leiter, PharmD, BCPS, BCCCP Clinical Pharmacist Please refer to [facility] for [place] numbers 10/09/2021, 7:27 AM

## 2021-10-09 NOTE — Progress Notes (Signed)
NAME:  [name], MRN:  [id number], DOB:  04-17-1966, LOS: 9 ADMISSION DATE:  09/30/2021, CONSULTATION DATE:  09/30/21 REFERRING MD:  EDP, CHIEF COMPLAINT:  acute hypoxic resp failure   History of Present Illness:  56 yo black female with pmh asthma, dm2, hyperlipidemia, PTSD/anxiety and chronic pancreatitis presented via EMS on cpap machine with sats in the mid 60's. Pt is intubated and sedated so ROS and complete history are unobtainable at this time. All history is obtained from ED and chart review.   Per report pt was at home partaking in cocaine with her husband when she began having sudden and severe sob. She was altered, unable to hold herself up in bed, minimally responsive. She had minimal air movement while on cpap and decision was made to emergently intubate pt. CXR revealed pulmonary edema. BP was noted to be extremely elevated >742 systolic. She was started on nitro infusion.   CCM was consulted for admission 2/2 pt's intubated status.   Pertinent  Medical History  DM2 Asthma Hyperlipidemia PTSD, anxiety Chronic pancreatitis  Significant Hospital Events: Including procedures, antibiotic start and stop dates in addition to other pertinent events   12/23: intubated 12/24: febrile, abx started for CAP 12/25 struggled with sedation - failed ketamine, precedex, dilaudid. Febrile and cultured 12/27 on Dilaudid and Versed drip 12/29 failed weaning this morning with tachypnea 12/31 was extubated 1230, failed extubation within an hour with bronchospasms, stridorous breathing.  Interim History / Subjective:  T-max 101.1 sedate this morning No other significant events overnight  Objective   Blood pressure 128/62, pulse 64, temperature 99.8 F (37.7 C), temperature source Rectal, resp. rate (!) 30, height 5\' 6"  (1.676 m), weight 76.9 kg, last menstrual period 03/07/2012, SpO2 94 %.    Vent Mode: PRVC FiO2 (%):  [40 %-100 %] 60 % Set Rate:  [30 bmp] 30 bmp Vt Set:  [410 mL]  410 mL PEEP:  [5 cmH20] 5 cmH20 Pressure Support:  [10 cmH20] 10 cmH20 Plateau Pressure:  [20 VZD63-87 cmH20] 20 cmH20   Intake/Output Summary (Last 24 hours) at 10/09/2021 1004 Last data filed at 10/09/2021 5643 Gross per 24 hour  Intake 1619.17 ml  Output 1650 ml  Net -30.83 ml   Filed Weights   10/07/21 0418 10/08/21 0420 10/09/21 0500  Weight: 79.7 kg 76.3 kg 76.9 kg    Examination: Gen:   Acutely ill-appearing HEENT: Endotracheal tube in place Lungs:    Still with wheezing CV:         S1-S2 appreciated Abd:   Bowel sounds appreciated Ext: No clubbing, no edema Skin:      Skin is warm and dry Neuro:   Moving all extremities, following commands  Labs reviewed: Labs reviewed-BUN 31, creatinine 0.8 Chest x-ray 10/09/21-slight improvement in bibasilar infiltrate Leukocytosis is stable-22.  Completed azithromycin and Rocephin   Resolved Hospital Problem list     Assessment & Plan:   Acute hypoxemic and hypercapnic respiratory failure -Present on admission  Acute pulmonary edema Community-acquired pneumonia -Failed extubation 12/30 Will attempt to wean again as tolerated however not ready for extubation at present because of persistent wheezing  Completed azithromycin and Rocephin for community-acquired pneumonia  Septic shock-resolved  History of polysubstance abuse PTSD -Wean sedation as tolerated -On Dilaudid and Versed  Type 2 diabetes with hyperglycemia -Continue SSI  Leukocytosis -Blood cultures 12/25 negative -Continue to monitor  Acute kidney injury -Creatinine improving  She did fail extubation 12/30 Did not have a good cuff leak, also did  have significant bronchospasms Will continue daily assessments Tracheal aspirate 10/08/2021-no organisms  Best Practice (right click and "Reselect all SmartList Selections" daily)   Diet/type: tubefeeds DVT prophylaxis: LMWH GI prophylaxis: PPI Lines: N/A Foley:  Yes, and it is still needed Code Status:   full code Last date of multidisciplinary goals of care discussion (updated patient's spouse at bedside)  Labs   CBC: Recent Labs  Lab 10/05/21 0317 10/06/21 0341 10/07/21 0410 10/07/21 1459 10/08/21 0412 10/09/21 0324  WBC 16.3* 19.5* 19.5*  --  22.7* 22.0*  NEUTROABS  --   --   --   --   --  15.4*  HGB 9.6* 9.6* 9.6* 10.9* 9.4* 9.6*  HCT 29.6* 29.6* 29.9* 32.0* 30.1* 31.1*  MCV 93.7 94.0 94.9  --  96.5 97.2  PLT 209 224 227  --  253 465    Basic Metabolic Panel: Recent Labs  Lab 10/05/21 0317 10/06/21 0341 10/07/21 0410 10/07/21 1459 10/08/21 0412 10/09/21 0324  NA 140 142 142 145 143 146*  K 3.8 3.6 3.4* 3.6 3.8 3.7  CL 102 105 105  --  106 107  CO2 28 28 29   --  28 30  GLUCOSE 198* 220* 199*  --  224* 127*  BUN 23* 28* 27*  --  31* 30*  CREATININE 0.87 0.82 0.85  --  0.78 0.79  CALCIUM 8.8* 8.8* 9.0  --  9.3 9.5  MG  --   --  2.5*  --   --   --    GFR: Estimated Creatinine Clearance: 81.2 mL/min (by C-G formula based on SCr of 0.79 mg/dL). Recent Labs  Lab 10/03/21 0432 10/04/21 0423 10/06/21 0341 10/07/21 0410 10/08/21 0412 10/09/21 0324  PROCALCITON 19.03  --   --   --   --   --   WBC 19.6*   < > 19.5* 19.5* 22.7* 22.0*   < > = values in this interval not displayed.    Liver Function Tests: No results for input(s): AST, ALT, ALKPHOS, BILITOT, PROT, ALBUMIN in the last 168 hours.  No results for input(s): LIPASE, AMYLASE in the last 168 hours.  No results for input(s): AMMONIA in the last 168 hours.  ABG    Component Value Date/Time   PHART 7.410 10/07/2021 1459   PCO2ART 46.5 10/07/2021 1459   PO2ART 59 (L) 10/07/2021 1459   HCO3 29.4 (H) 10/07/2021 1459   TCO2 31 10/07/2021 1459   ACIDBASEDEF 1.0 10/02/2021 0625   O2SAT 90.0 10/07/2021 1459     Coagulation Profile: No results for input(s): INR, PROTIME in the last 168 hours.   Cardiac Enzymes: No results for input(s): CKTOTAL, CKMB, CKMBINDEX, TROPONINI in the last 168  hours.  HbA1C: Hgb A1c MFr Bld  Date/Time Value Ref Range Status  10/01/2021 08:39 AM 9.1 (H) 4.8 - 5.6 % Final    Comment:    (NOTE) Pre diabetes:          5.7%-6.4%  Diabetes:              >6.4%  Glycemic control for   <7.0% adults with diabetes   09/11/2018 03:57 PM 7.6 (H) 4.8 - 5.6 % Final    Comment:             Prediabetes: 5.7 - 6.4          Diabetes: >6.4          Glycemic control for adults with diabetes: <7.0     CBG: Recent  Labs  Lab 10/08/21 1509 10/08/21 1950 10/08/21 2319 10/09/21 0311 10/09/21 0708  GLUCAP 157* 100* 141* 117* 90   The patient is critically ill with multiple organ systems failure and requires high complexity decision making for assessment and support, frequent evaluation and titration of therapies, application of advanced monitoring technologies and extensive interpretation of multiple databases. Critical Care Time devoted to patient care services described in this note independent of APP/resident time (if applicable)  is 32 minutes.   Sherrilyn Rist MD [place] [facility] Personal pager: See Amion If unanswered, please page CCM On-call: [phone] Dr Kellie Shropshire is pain management doctor at [facility]

## 2021-10-09 NOTE — Progress Notes (Signed)
Pharmacy Antibiotic Note  [name] is a 56 y.o. female  with possible  pneumonia.  Pharmacy has been consulted for cefepime and vancomycin dosing. -WBC= 22, tmax= 101.1 -CrCl ~ 80  Plan: -Cefepime 2gm IV q8h -vancomycin 1000mg  IV q12h (estimated AUC ~ 425) -Will follow renal function, cultures and clinical progress   Height: 5\' 6"  (167.6 cm) Weight: 76.9 kg (169 lb 8.5 oz) IBW/kg (Calculated) : 59.3  Temp (24hrs), Avg:99 F (37.2 C), Min:98 F (36.7 C), Max:101.1 F (38.4 C)  Recent Labs  Lab 10/05/21 0317 10/06/21 0341 10/07/21 0410 10/08/21 0412 10/09/21 0324  WBC 16.3* 19.5* 19.5* 22.7* 22.0*  CREATININE 0.87 0.82 0.85 0.78 0.79    Estimated Creatinine Clearance: 81.2 mL/min (by C-G formula based on SCr of 0.79 mg/dL).    No Known Allergies  Antimicrobials this admission: Vancomycin 1/1>> Cefepime 1/1>> Ceftriaxone 12/24>>12/31 Azith 12/24>>12/27  Dose adjustments this admission:   Microbiology results: 12/31 Resp: pending 12/29 Resp: normal flora 12/25 Bcx: negative 12/24 Ucx: negative 12/24 MRSA nares negative  Thank you for allowing pharmacy to be a part of this patients care.  Hildred Laser, PharmD Clinical Pharmacist **Pharmacist phone directory can now be found on [email or web address] ([email or web address]).  Listed under [place].

## 2021-10-09 NOTE — Progress Notes (Signed)
[place] Progress Note Patient Name: [name] DOB: 01/03/1966 MRN: [id number]   Date of Service  10/09/2021  HPI/Events of Note  Bilateral, non violent soft wrist restriants ordered to prevent self injury and harm from pullineg lines/tubes.   Camera eval done. On Vent.   eICU Interventions  Bilateral, non violent soft wrist restriants ordered to prevent self injury and harm from pullineg lines/tubes.       Intervention Category Minor Interventions: Other:;Agitation / anxiety - evaluation and management  Elmer Sow 10/09/2021, 6:41 AM

## 2021-10-09 NOTE — Progress Notes (Addendum)
[place] Progress Note Patient Name: [name] DOB: [birth year] MRN: [id number]   Date of Service  10/09/2021  HPI/Events of Note  Notified that Fio2 on vent had to be increased from 60% to 70%. ON camera, on PRVC 50/peep 5/RR 30/ VT 410, o2 sat is 91   eICU Interventions  CXR and ABG ordered      Intervention Category Major Interventions: Respiratory failure - evaluation and management  Bernell Sigal G Leonardo Makris 10/09/2021, 9:00 PM  Addendum at 9:40 pm CXR reviewed radiologist called to let me know that the ETT is still deep and around 1.5 cm above carina and patient also with worsening air space disease Seen on camera Current o2 sat is 93 on 70%/peep 5 RN notes that she does have a ton of secretions ET secretions were send on 12/30 with no organisms However noted that she has also had fevers In addition, also has positive fluid status and does have edema Currently is 5.7 liter positive ABG pending Will retract ETT by 1 cm, RT to mark the site  Lasix x 1, start vanc and cefepime Have d/w RN  Will recheck cxr to confirm the ETT is ok in the AM    Addendum at 945 pm ABG noted RT had increased the fio2 to 100 Will increase peep to 8 Recheck ABG around midnight and call us with results  Addendum at 11:20 pm Vomiting, tube feeds held for now Qtc 451 Zofran ordered  Addendum at 1:40 am Noted midnight ABG Noted Po2 of 71 Seen on camera Comfortably sedated, peripheral o2 sat is 99 so hopefully we can wean fio2 down in the next few hours Repeat ABG and cxr in AM

## 2021-10-09 NOTE — Progress Notes (Signed)
[place] Progress Note Patient Name: [name] DOB: December 27, 1965 MRN: [id number]   Date of Service  10/09/2021  HPI/Events of Note  maxed out on dilaudid 4mg  and versed at 5, breathing 10-15 above vent rate with agitation, was just bolused but bedside RN asking to see if cont rate can be increased slightly, is tugging on bil restraints , very agitated  eICU Interventions  Discussed with RN. CCM ground team there, ordering precedex gtt.      Intervention Category Intermediate Interventions: Respiratory distress - evaluation and management Minor Interventions: Agitation / anxiety - evaluation and management  Elmer Sow 10/09/2021, 5:36 AM

## 2021-10-10 DIAGNOSIS — J9601 Acute respiratory failure with hypoxia: Secondary | ICD-10-CM | POA: Diagnosis not present

## 2021-10-10 LAB — POCT I-STAT 7, (LYTES, BLD GAS, ICA,H+H)
Acid-Base Excess: 3 mmol/L — ABNORMAL HIGH (ref 0.0–2.0)
Bicarbonate: 26.5 mmol/L (ref 20.0–28.0)
Calcium, Ion: 1.28 mmol/L (ref 1.15–1.40)
HCT: 23 % — ABNORMAL LOW (ref 36.0–46.0)
Hemoglobin: 7.8 g/dL — ABNORMAL LOW (ref 12.0–15.0)
O2 Saturation: 95 %
Patient temperature: 99.1
Potassium: 3.6 mmol/L (ref 3.5–5.1)
Sodium: 147 mmol/L — ABNORMAL HIGH (ref 135–145)
TCO2: 28 mmol/L (ref 22–32)
pCO2 arterial: 36.2 mmHg (ref 32.0–48.0)
pH, Arterial: 7.474 — ABNORMAL HIGH (ref 7.350–7.450)
pO2, Arterial: 71 mmHg — ABNORMAL LOW (ref 83.0–108.0)

## 2021-10-10 LAB — GLUCOSE, CAPILLARY
Glucose-Capillary: 85 mg/dL (ref 70–99)
Glucose-Capillary: 85 mg/dL (ref 70–99)
Glucose-Capillary: 90 mg/dL (ref 70–99)
Glucose-Capillary: 123 mg/dL — ABNORMAL HIGH (ref 70–99)
Glucose-Capillary: 99 mg/dL (ref 70–99)
Glucose-Capillary: 84 mg/dL (ref 70–99)
Glucose-Capillary: 114 mg/dL — ABNORMAL HIGH (ref 70–99)
Glucose-Capillary: 126 mg/dL — ABNORMAL HIGH (ref 70–99)

## 2021-10-10 LAB — CULTURE, RESPIRATORY W GRAM STAIN: Culture: NORMAL

## 2021-10-10 LAB — CBC
HCT: 31.1 % — ABNORMAL LOW (ref 36.0–46.0)
Hemoglobin: 9.8 g/dL — ABNORMAL LOW (ref 12.0–15.0)
MCH: 30.4 pg (ref 26.0–34.0)
MCHC: 31.5 g/dL (ref 30.0–36.0)
MCV: 96.6 fL (ref 80.0–100.0)
Platelets: 320 10*3/uL (ref 150–400)
RBC: 3.22 MIL/uL — ABNORMAL LOW (ref 3.87–5.11)
RDW: 13.7 % (ref 11.5–15.5)
WBC: 23.1 10*3/uL — ABNORMAL HIGH (ref 4.0–10.5)
nRBC: 0 % (ref 0.0–0.2)

## 2021-10-10 LAB — BASIC METABOLIC PANEL
Anion gap: 10 (ref 5–15)
BUN: 26 mg/dL — ABNORMAL HIGH (ref 6–20)
CO2: 27 mmol/L (ref 22–32)
Calcium: 9.2 mg/dL (ref 8.9–10.3)
Chloride: 107 mmol/L (ref 98–111)
Creatinine, Ser: 0.81 mg/dL (ref 0.44–1.00)
GFR, Estimated: >60 mL/min (ref >60)
Glucose, Bld: 98 mg/dL (ref 70–99)
Potassium: 3.9 mmol/L (ref 3.5–5.1)
Sodium: 144 mmol/L (ref 135–145)

## 2021-10-10 LAB — MRSA NEXT GEN BY PCR, NASAL: MRSA by PCR Next Gen: NOT DETECTED

## 2021-10-10 LAB — MAGNESIUM: Magnesium: 2.2 mg/dL (ref 1.7–2.4)

## 2021-10-10 MED ORDER — INSULIN GLARGINE-YFGN 100 UNIT/ML ~~LOC~~ SOLN
5.0000 [IU] | Freq: Two times a day (BID) | SUBCUTANEOUS | Status: DC
  Administered 2021-10-10 – 2021-10-11 (×4): 5 [IU] via SUBCUTANEOUS
  Filled 2021-10-10 (×6): qty 0.05

## 2021-10-10 MED ORDER — METOCLOPRAMIDE HCL 5 MG/5ML PO SOLN
10.0000 mg | Freq: Three times a day (TID) | ORAL | Status: DC
  Administered 2021-10-10 – 2021-10-17 (×20): 10 mg
  Filled 2021-10-10 (×22): qty 10

## 2021-10-10 NOTE — Progress Notes (Signed)
NAME:  [name], MRN:  [id number], DOB:  December 30, 1965, LOS: 87 ADMISSION DATE:  09/30/2021, CONSULTATION DATE: 09/30/2021 REFERRING MD: ED provider, CHIEF COMPLAINT: Acute hypoxic respiratory failure  History of Present Illness:  56 yo black female with pmh asthma, dm2, hyperlipidemia, PTSD/anxiety and chronic pancreatitis presented via EMS on cpap machine with sats in the mid 60's. Pt is intubated and sedated so ROS and complete history are unobtainable at this time. All history is obtained from ED and chart review.    Per report pt was at home partaking in cocaine with her husband when she began having sudden and severe sob. She was altered, unable to hold herself up in bed, minimally responsive. She had minimal air movement while on cpap and decision was made to emergently intubate pt. CXR revealed pulmonary edema. BP was noted to be extremely elevated >629 systolic. She was started on nitro infusion.    CCM was consulted for admission 2/2 pt's intubated status.   Pertinent  Medical History  DM2 Asthma Hyperlipidemia PTSD, anxiety Chronic pancreatitis  Significant Hospital Events: Including procedures, antibiotic start and stop dates in addition to other pertinent events   12/23: intubated 12/24: febrile, abx started for CAP 12/25 struggled with sedation - failed ketamine, precedex, dilaudid. Febrile and cultured 12/27 on Dilaudid and Versed drip 12/29 failed weaning this morning with tachypnea 12/31 was extubated 1230, failed extubation within an hour with bronchospasms, stridorous breathing. 01/01 started on empirical antibiotics with vancomycin and cefepime for HCAP  Interim History / Subjective:  No significant acute events overnight Remains sedated.  Patient opens eyes to voice.   Afebrile this morning.  Objective   Blood pressure (!) 152/69, pulse (!) 58, temperature 98.7 F (37.1 C), temperature source Axillary, resp. rate (!) 30, height 5\' 6"  (1.676 m), weight 76.9  kg, last menstrual period 03/07/2012, SpO2 98 %.    Vent Mode: PRVC FiO2 (%):  [60 %-100 %] 100 % Set Rate:  [30 bmp] 30 bmp Vt Set:  [410 mL] 410 mL PEEP:  [5 cmH20-8 cmH20] 8 cmH20 Plateau Pressure:  [20 cmH20-23 cmH20] 21 cmH20   Intake/Output Summary (Last 24 hours) at 10/10/2021 0710 Last data filed at 10/10/2021 4765 Gross per 24 hour  Intake 1958.13 ml  Output 850 ml  Net 1108.13 ml   Filed Weights   10/07/21 0418 10/08/21 0420 10/09/21 0500  Weight: 79.7 kg 76.3 kg 76.9 kg    Examination: General: Ill-appearing middle-aged woman.  Remains sedated.  NAD HENT: ET tube in place. PERRLA Lungs: Rhonchorous lung sounds.  No increased WOB.  Cardiovascular: RRR. No m/r/g.  Abdomen: Mildly distended abdomen.  Normal bowel sounds Extremities: Warm.  No edema Neuro: Sedated.  Moves all extremities.   Labs Reviewed: Creatinine and GFR WNL.  WBC 23.1.  Hgb 9.8 ABG 7.47/36.2/71/20 6.5  Resolved Hospital Problem list   Septic shock AKI Hypertensive crisis 2/2 cocaine use Assessment & Plan:  Acute hypoxemic hypercarbic respiratory failure --Status post failed extubation 12/30 due to significant bronchospasms --Continue Precedex, Dilaudid and Versed --Maintain full vent support with PRVC, decrease FiO2 as able to keep O2 sats 90-94% --Continue daily assessment and wean as tolerated --VAP bundle  Community-acquired pneumonia Acute pulmonary edema Leukocytosis Status post 5 days of azithromycin and Rocephin for CAP. CXR on 1/1 showed bilateral airspace disease concerning for pneumonia.  White count continues to trend up.  Started on empirical antibiotics for HCAP. --Continue empirical abx with Vanco and cefepime (day 2 of abx) --Tracheal aspirate  no growth in 2 days --Bcx 12/25, Ucx 12/24 NGTD --Pending MRSA screen --Trend white count, monitor fever curve  History of polysubstance abuse  PTSD --Cocaine use prior to hospitalization --Remains sedation with Dilaudid and  Versed, wean as tolerated  T2DM, uncontrolled (A1c 9.1) --Continue tube feeds --Continue SSI --CBG goal 140-180   Best Practice (right click and "Reselect all SmartList Selections" daily)   Diet/type: tubefeeds DVT prophylaxis: LMWH GI prophylaxis: PPI Lines: N/A Foley:  Yes, and it is still needed Code Status:  full code Last date of multidisciplinary goals of care discussion [son and spouse updated at bedside]  Labs   CBC: Recent Labs  Lab 10/06/21 0341 10/07/21 0410 10/07/21 1459 10/08/21 0412 10/09/21 0324 10/09/21 2127 10/10/21 0027 10/10/21 0350  WBC 19.5* 19.5*  --  22.7* 22.0*  --   --  23.1*  NEUTROABS  --   --   --   --  15.4*  --   --   --   HGB 9.6* 9.6*   < > 9.4* 9.6* 11.6* 7.8* 9.8*  HCT 29.6* 29.9*   < > 30.1* 31.1* 34.0* 23.0* 31.1*  MCV 94.0 94.9  --  96.5 97.2  --   --  96.6  PLT 224 227  --  253 296  --   --  320   < > = values in this interval not displayed.    Basic Metabolic Panel: Recent Labs  Lab 10/06/21 0341 10/07/21 0410 10/07/21 1459 10/08/21 0412 10/09/21 0324 10/09/21 2127 10/10/21 0027 10/10/21 0350  NA 142 142   < > 143 146* 147* 147* 144  K 3.6 3.4*   < > 3.8 3.7 3.5 3.6 3.9  CL 105 105  --  106 107  --   --  107  CO2 28 29  --  28 30  --   --  27  GLUCOSE 220* 199*  --  224* 127*  --   --  98  BUN 28* 27*  --  31* 30*  --   --  26*  CREATININE 0.82 0.85  --  0.78 0.79  --   --  0.81  CALCIUM 8.8* 9.0  --  9.3 9.5  --   --  9.2  MG  --  2.5*  --   --   --   --   --  2.2   < > = values in this interval not displayed.   GFR: Estimated Creatinine Clearance: 80.2 mL/min (by C-G formula based on SCr of 0.81 mg/dL). Recent Labs  Lab 10/07/21 0410 10/08/21 0412 10/09/21 0324 10/10/21 0350  WBC 19.5* 22.7* 22.0* 23.1*    Liver Function Tests: No results for input(s): AST, ALT, ALKPHOS, BILITOT, PROT, ALBUMIN in the last 168 hours. No results for input(s): LIPASE, AMYLASE in the last 168 hours. No results for  input(s): AMMONIA in the last 168 hours.  ABG    Component Value Date/Time   PHART 7.474 (H) 10/10/2021 0027   PCO2ART 36.2 10/10/2021 0027   PO2ART 71 (L) 10/10/2021 0027   HCO3 26.5 10/10/2021 0027   TCO2 28 10/10/2021 0027   ACIDBASEDEF 1.0 10/02/2021 0625   O2SAT 95.0 10/10/2021 0027     Critical care time: 30

## 2021-10-10 NOTE — Progress Notes (Signed)
FIO2 increased to 100% peep increased to 8 and ETT retracted 1 cm to 21cm @ lip per Elink

## 2021-10-11 LAB — BASIC METABOLIC PANEL
Anion gap: 9 (ref 5–15)
BUN: 24 mg/dL — ABNORMAL HIGH (ref 6–20)
CO2: 25 mmol/L (ref 22–32)
Calcium: 9 mg/dL (ref 8.9–10.3)
Chloride: 107 mmol/L (ref 98–111)
Creatinine, Ser: 0.85 mg/dL (ref 0.44–1.00)
GFR, Estimated: >60 mL/min (ref >60)
Glucose, Bld: 157 mg/dL — ABNORMAL HIGH (ref 70–99)
Potassium: 3.9 mmol/L (ref 3.5–5.1)
Sodium: 141 mmol/L (ref 135–145)

## 2021-10-11 LAB — CBC
HCT: 31 % — ABNORMAL LOW (ref 36.0–46.0)
Hemoglobin: 9.6 g/dL — ABNORMAL LOW (ref 12.0–15.0)
MCH: 29.7 pg (ref 26.0–34.0)
MCHC: 31 g/dL (ref 30.0–36.0)
MCV: 96 fL (ref 80.0–100.0)
Platelets: 349 10*3/uL (ref 150–400)
RBC: 3.23 MIL/uL — ABNORMAL LOW (ref 3.87–5.11)
RDW: 13.4 % (ref 11.5–15.5)
WBC: 20 10*3/uL — ABNORMAL HIGH (ref 4.0–10.5)
nRBC: 0 % (ref 0.0–0.2)

## 2021-10-11 LAB — GLUCOSE, CAPILLARY
Glucose-Capillary: 112 mg/dL — ABNORMAL HIGH (ref 70–99)
Glucose-Capillary: 124 mg/dL — ABNORMAL HIGH (ref 70–99)
Glucose-Capillary: 135 mg/dL — ABNORMAL HIGH (ref 70–99)
Glucose-Capillary: 150 mg/dL — ABNORMAL HIGH (ref 70–99)
Glucose-Capillary: 161 mg/dL — ABNORMAL HIGH (ref 70–99)
Glucose-Capillary: 161 mg/dL — ABNORMAL HIGH (ref 70–99)
Glucose-Capillary: 56 mg/dL — ABNORMAL LOW (ref 70–99)

## 2021-10-11 MED ORDER — VITAL AF 1.2 CAL PO LIQD
1000.0000 mL | ORAL | Status: DC
  Administered 2021-10-12 – 2021-10-21 (×9): 1000 mL

## 2021-10-11 MED ORDER — CLONAZEPAM 1 MG PO TABS
1.0000 mg | ORAL_TABLET | Freq: Three times a day (TID) | ORAL | Status: DC
  Administered 2021-10-11 – 2021-10-12 (×4): 1 mg
  Filled 2021-10-11 (×4): qty 1

## 2021-10-11 MED ORDER — DEXTROSE 50 % IV SOLN
12.5000 g | INTRAVENOUS | Status: AC
  Administered 2021-10-11: 12.5 g via INTRAVENOUS

## 2021-10-11 MED ORDER — FUROSEMIDE 10 MG/ML IJ SOLN
40.0000 mg | Freq: Once | INTRAMUSCULAR | Status: AC
  Administered 2021-10-11: 40 mg via INTRAVENOUS
  Filled 2021-10-11: qty 4

## 2021-10-11 MED ORDER — SODIUM CHLORIDE 0.9 % IV SOLN: INTRAVENOUS | Status: DC | PRN

## 2021-10-11 MED ORDER — CLONIDINE HCL 0.3 MG PO TABS
0.3000 mg | ORAL_TABLET | Freq: Four times a day (QID) | ORAL | Status: DC
  Administered 2021-10-11 – 2021-10-13 (×8): 0.3 mg
  Filled 2021-10-11 (×8): qty 1

## 2021-10-11 MED ORDER — DEXTROSE 50 % IV SOLN
INTRAVENOUS | Status: AC
  Filled 2021-10-11: qty 50

## 2021-10-11 MED ORDER — POTASSIUM CHLORIDE 20 MEQ PO PACK
20.0000 meq | PACK | Freq: Once | ORAL | Status: AC
  Administered 2021-10-11: 20 meq
  Filled 2021-10-11: qty 1

## 2021-10-11 NOTE — Progress Notes (Addendum)
NAME:  [name], MRN:  [id number], DOB:  [birth year], LOS: 28 ADMISSION DATE:  09/30/2021, CONSULTATION DATE: 09/30/2021 REFERRING MD: ED provider, CHIEF COMPLAINT: Acute hypoxic respiratory failure  History of Present Illness:  56 yo black female with pmh asthma, dm2, hyperlipidemia, PTSD/anxiety and chronic pancreatitis presented via EMS on cpap machine with sats in the mid 60's. Pt is intubated and sedated so ROS and complete history are unobtainable at this time. All history is obtained from ED and chart review.    Per report pt was at home partaking in cocaine with her husband when she began having sudden and severe sob. She was altered, unable to hold herself up in bed, minimally responsive. She had minimal air movement while on cpap and decision was made to emergently intubate pt. CXR revealed pulmonary edema. BP was noted to be extremely elevated >001 systolic. She was started on nitro infusion.    CCM was consulted for admission 2/2 pt's intubated status.   Pertinent  Medical History  DM2 Asthma Hyperlipidemia PTSD, anxiety Chronic pancreatitis  Significant Hospital Events: Including procedures, antibiotic start and stop dates in addition to other pertinent events   12/23: intubated 12/24: febrile, abx started for CAP 12/25 struggled with sedation - failed ketamine, precedex, dilaudid. Febrile and cultured 12/27 on Dilaudid and Versed drip 12/29 failed weaning this morning with tachypnea 12/31 was extubated 1230, failed extubation within an hour with bronchospasms, stridorous breathing. 01/01 started on empirical antibiotics with vancomycin and cefepime for HCAP 01/02 MRSA screen negative, vancomycin discontinued  Interim History / Subjective:  No acute events overnight. FiO2 increased this AM.  Remains sedated. Eyes open but does not follow commands Spouse at bedside  Objective   Blood pressure 106/62, pulse 63, temperature 100.3 F (37.9 C), temperature source  Axillary, resp. rate (!) 30, height 5\' 6"  (1.676 m), weight 78.5 kg, last menstrual period 03/07/2012, SpO2 99 %.    Vent Mode: PRVC FiO2 (%):  [70 %-80 %] 70 % Set Rate:  [30 bmp] 30 bmp Vt Set:  [410 mL] 410 mL PEEP:  [8 cmH20] 8 cmH20 Plateau Pressure:  [17 cmH20-24 cmH20] 21 cmH20   Intake/Output Summary (Last 24 hours) at 10/11/2021 0749 Last data filed at 10/11/2021 0700 Gross per 24 hour  Intake 1969.45 ml  Output 850 ml  Net 1119.45 ml   Filed Weights   10/09/21 0500 10/10/21 0500 10/11/21 0500  Weight: 76.9 kg 75.2 kg 78.5 kg    Examination: General: Ill-appearing, obese, sedated.  NAD HENT: ET tube in place.  PERRLA. Lungs: CTAB.  No increased WOB Cardiovascular: RRR.  No murmurs rubs or gallops. Abdomen: Soft.  Mildly distended.  Normal BS. Extremities: Well perfused.  No rashes or lesions. Neuro: Sedated.  RASS -1.  Moves all extremities.   Labs Reviewed: CBGs 110s to 160s WBC 20, Hgb 9.6 Normal kidney function  Resolved Hospital Problem list   Septic shock AKI Hypertensive crisis 2/2 cocaine use Assessment & Plan:  Acute hypoxemic hypercarbic respiratory failure Status post failed extubation 12/30 due to significant bronchospasms.  Required increase in FiO2 this morning.  Unable to remain due to increased FiO2.  Wean off sedation as tolerated. --Slowly wean off Versed and Precedex --Continue Dilaudid and oxy --Start clonidine and Klonopin --Continue daily assessment and SBT --VAP bundle  Community-acquired pneumonia Acute pulmonary edema Leukocytosis Status post 5 days of azithromycin and Rocephin for CAP. CXR on 1/1 showed bilateral airspace disease concerning for pneumonia.  White count slowly trending down  after broadening antibiotics. --No staph or Pseudomonas on tracheal aspirate --Continue cefepime (day 3/5) --Continue to monitor fever curve, WBC  Diastolic heart failure Echo 12/24 showed EF 65 to 70%, mild LVH and no valvular abnormalities.   Net I&O of 3.5 L since admission.  UOP not impressive. No electrolyte abnormalities. --IV lasix 40 mg x1 dose --KCL 20 mEq per Tube x1 dose --Keep K+ >4, Mg>2  History of polysubstance abuse  PTSD --Weaning off sedation --On Dilaudid and oxycodone  T2DM, uncontrolled (A1c 9.1) CBGs 110s to 160s in last 24 hrs. --Continue tube feeds --Continue SSI, Semglee 5 units BID --CBG goal 140-180   Best Practice (right click and "Reselect all SmartList Selections" daily)   Diet/type: tubefeeds DVT prophylaxis: LMWH GI prophylaxis: PPI Lines: N/A Foley:  Yes, and it is still needed Code Status:  full code Last date of multidisciplinary goals of care discussion [Spoke to spouse at bedside, all questions answered this morning]  Labs   CBC: Recent Labs  Lab 10/07/21 0410 10/07/21 1459 10/08/21 0412 10/09/21 0324 10/09/21 2127 10/10/21 0027 10/10/21 0350 10/11/21 0510  WBC 19.5*  --  22.7* 22.0*  --   --  23.1* 20.0*  NEUTROABS  --   --   --  15.4*  --   --   --   --   HGB 9.6*   < > 9.4* 9.6* 11.6* 7.8* 9.8* 9.6*  HCT 29.9*   < > 30.1* 31.1* 34.0* 23.0* 31.1* 31.0*  MCV 94.9  --  96.5 97.2  --   --  96.6 96.0  PLT 227  --  253 296  --   --  320 349   < > = values in this interval not displayed.    Basic Metabolic Panel: Recent Labs  Lab 10/07/21 0410 10/07/21 1459 10/08/21 0412 10/09/21 0324 10/09/21 2127 10/10/21 0027 10/10/21 0350 10/11/21 0510  NA 142   < > 143 146* 147* 147* 144 141  K 3.4*   < > 3.8 3.7 3.5 3.6 3.9 3.9  CL 105  --  106 107  --   --  107 107  CO2 29  --  28 30  --   --  27 25  GLUCOSE 199*  --  224* 127*  --   --  98 157*  BUN 27*  --  31* 30*  --   --  26* 24*  CREATININE 0.85  --  0.78 0.79  --   --  0.81 0.85  CALCIUM 9.0  --  9.3 9.5  --   --  9.2 9.0  MG 2.5*  --   --   --   --   --  2.2  --    < > = values in this interval not displayed.   GFR: Estimated Creatinine Clearance: 77.2 mL/min (by C-G formula based on SCr of 0.85  mg/dL). Recent Labs  Lab 10/08/21 0412 10/09/21 0324 10/10/21 0350 10/11/21 0510  WBC 22.7* 22.0* 23.1* 20.0*    Liver Function Tests: No results for input(s): AST, ALT, ALKPHOS, BILITOT, PROT, ALBUMIN in the last 168 hours. No results for input(s): LIPASE, AMYLASE in the last 168 hours. No results for input(s): AMMONIA in the last 168 hours.  ABG    Component Value Date/Time   PHART 7.474 (H) 10/10/2021 0027   PCO2ART 36.2 10/10/2021 0027   PO2ART 71 (L) 10/10/2021 0027   HCO3 26.5 10/10/2021 0027   TCO2 28 10/10/2021 0027   ACIDBASEDEF  1.0 10/02/2021 0625   O2SAT 95.0 10/10/2021 0027     Critical care time: 30

## 2021-10-11 NOTE — Progress Notes (Signed)
Hypoglycemic Event  CBG: 56  Treatment: D50 25 mL (12.5 gm)  Symptoms: None  Follow-up CBG: Time:23:40 CBG Result:161  Possible Reasons for Event: Medication regimen: Tube feed coverage, plus sliding scale, as well as long acting insulin.  Comments/MD notified:Hypoglycemia protocol followed; midnight doses of insulin not given.    [name]

## 2021-10-11 NOTE — Progress Notes (Addendum)
Nutrition Follow-up  DOCUMENTATION CODES:   Not applicable  INTERVENTION:   Continue TF via OG tube: Increase Vital AF 1.2 goal rate to 65 ml/h (1560 ml per day)  Provides 1872 kcal, 117 gm protein, 1265 ml free water daily.  NUTRITION DIAGNOSIS:   Inadequate oral intake related to inability to eat as evidenced by NPO status.  Ongoing   GOAL:   Provide needs based on ASPEN/SCCM guidelines  Met with TF  MONITOR:   Labs, I & O's, Weight trends, TF tolerance, Vent status  REASON FOR ASSESSMENT:   Consult Enteral/tube feeding initiation and management  ASSESSMENT:   Pt with PMH significant for drug abuse, asthma, type 2 DM, HLD, PTSD/anxiety, and chronic pancreatitis admitted with acute hypoxic respiratory failure and acute pulmonary edema requiring intubation.   Currently receiving Vital AF 1.2 at 60 ml/h via OG tube.  Tolerating without difficulty.  Not quite meeting re-estimated calorie needs; will increase goal rate.   Patient remains intubated on ventilator support MV: 12 L/min Temp (24hrs), Avg:99.3 F (37.4 C), Min:98.6 F (37 C), Max:100.3 F (37.9 C)   Labs reviewed.  CBG: 112-161  Medications reviewed and include Precedex, Colace, Novolog, Semglee, Reglan, Protonix, Miralax, Senokot, Versed.   Admission weight 76 kg Current weight 78.5 kg  I/O +7.2 L since admission  Diet Order:   Diet Order             Diet NPO time specified  Diet effective now                   EDUCATION NEEDS:   Not appropriate for education at this time  Skin:  Skin Assessment: Reviewed RN Assessment  Last BM:  1/3 type 6  Height:   Ht Readings from Last 1 Encounters:  10/02/21 5' 6"  (1.676 m)    Weight:   Wt Readings from Last 1 Encounters:  10/11/21 78.5 kg    BMI:  Body mass index is 27.93 kg/m.  Estimated Nutritional Needs:   Kcal:  6144  Protein:  110-130 gm  Fluid:  >/= 2 L    Lucas Mallow, RD, LDN, CNSC Please refer to Amion for  contact information.

## 2021-10-12 ENCOUNTER — Inpatient Hospital Stay (HOSPITAL_COMMUNITY): Payer: Medicare HMO

## 2021-10-12 DIAGNOSIS — F1910 Other psychoactive substance abuse, uncomplicated: Secondary | ICD-10-CM

## 2021-10-12 DIAGNOSIS — F1410 Cocaine abuse, uncomplicated: Secondary | ICD-10-CM | POA: Diagnosis not present

## 2021-10-12 DIAGNOSIS — G9341 Metabolic encephalopathy: Secondary | ICD-10-CM

## 2021-10-12 DIAGNOSIS — J9601 Acute respiratory failure with hypoxia: Secondary | ICD-10-CM | POA: Diagnosis not present

## 2021-10-12 DIAGNOSIS — R112 Nausea with vomiting, unspecified: Secondary | ICD-10-CM

## 2021-10-12 DIAGNOSIS — J810 Acute pulmonary edema: Secondary | ICD-10-CM | POA: Diagnosis not present

## 2021-10-12 LAB — CBC
HCT: 30.6 % — ABNORMAL LOW (ref 36.0–46.0)
Hemoglobin: 9.3 g/dL — ABNORMAL LOW (ref 12.0–15.0)
MCH: 29.8 pg (ref 26.0–34.0)
MCHC: 30.4 g/dL (ref 30.0–36.0)
MCV: 98.1 fL (ref 80.0–100.0)
Platelets: 361 10*3/uL (ref 150–400)
RBC: 3.12 MIL/uL — ABNORMAL LOW (ref 3.87–5.11)
RDW: 13.5 % (ref 11.5–15.5)
WBC: 17.5 10*3/uL — ABNORMAL HIGH (ref 4.0–10.5)
nRBC: 0 % (ref 0.0–0.2)

## 2021-10-12 LAB — GLUCOSE, CAPILLARY
Glucose-Capillary: 155 mg/dL — ABNORMAL HIGH (ref 70–99)
Glucose-Capillary: 221 mg/dL — ABNORMAL HIGH (ref 70–99)
Glucose-Capillary: 115 mg/dL — ABNORMAL HIGH (ref 70–99)
Glucose-Capillary: 150 mg/dL — ABNORMAL HIGH (ref 70–99)
Glucose-Capillary: 167 mg/dL — ABNORMAL HIGH (ref 70–99)
Glucose-Capillary: 128 mg/dL — ABNORMAL HIGH (ref 70–99)

## 2021-10-12 LAB — BASIC METABOLIC PANEL
Anion gap: 8 (ref 5–15)
BUN: 25 mg/dL — ABNORMAL HIGH (ref 6–20)
CO2: 25 mmol/L (ref 22–32)
Calcium: 9.2 mg/dL (ref 8.9–10.3)
Chloride: 108 mmol/L (ref 98–111)
Creatinine, Ser: 0.95 mg/dL (ref 0.44–1.00)
GFR, Estimated: >60 mL/min (ref >60)
Glucose, Bld: 159 mg/dL — ABNORMAL HIGH (ref 70–99)
Potassium: 4.3 mmol/L (ref 3.5–5.1)
Sodium: 141 mmol/L (ref 135–145)

## 2021-10-12 LAB — MAGNESIUM: Magnesium: 2.4 mg/dL (ref 1.7–2.4)

## 2021-10-12 MED ORDER — INSULIN GLARGINE-YFGN 100 UNIT/ML ~~LOC~~ SOLN
5.0000 [IU] | Freq: Every day | SUBCUTANEOUS | Status: DC
  Administered 2021-10-12 – 2021-10-17 (×6): 5 [IU] via SUBCUTANEOUS
  Filled 2021-10-12 (×6): qty 0.05

## 2021-10-12 MED ORDER — INSULIN ASPART 100 UNIT/ML IJ SOLN
8.0000 [IU] | INTRAMUSCULAR | Status: DC
  Administered 2021-10-13 – 2021-10-23 (×37): 8 [IU] via SUBCUTANEOUS

## 2021-10-12 MED ORDER — CLONAZEPAM 1 MG PO TABS
2.0000 mg | ORAL_TABLET | Freq: Three times a day (TID) | ORAL | Status: DC
  Administered 2021-10-12 – 2021-10-17 (×14): 2 mg
  Filled 2021-10-12 (×14): qty 2

## 2021-10-12 MED ORDER — FUROSEMIDE 10 MG/ML IJ SOLN
80.0000 mg | Freq: Two times a day (BID) | INTRAMUSCULAR | Status: DC
  Administered 2021-10-12 (×2): 80 mg via INTRAVENOUS
  Filled 2021-10-12 (×2): qty 8

## 2021-10-12 MED ORDER — ATROPINE SULFATE 1 MG/10ML IJ SOSY
PREFILLED_SYRINGE | INTRAMUSCULAR | Status: AC
  Filled 2021-10-12: qty 10

## 2021-10-12 MED ORDER — HYDROMORPHONE HCL 2 MG PO TABS
2.0000 mg | ORAL_TABLET | ORAL | Status: DC
  Administered 2021-10-12 – 2021-10-14 (×13): 2 mg
  Filled 2021-10-12 (×13): qty 1

## 2021-10-12 MED ORDER — QUETIAPINE FUMARATE 25 MG PO TABS
25.0000 mg | ORAL_TABLET | Freq: Two times a day (BID) | ORAL | Status: DC
  Administered 2021-10-12 – 2021-10-13 (×3): 25 mg
  Filled 2021-10-12 (×3): qty 1

## 2021-10-12 MED ORDER — FUROSEMIDE 10 MG/ML IJ SOLN: 80.0000 mg | Freq: Two times a day (BID) | INTRAMUSCULAR | Status: DC

## 2021-10-12 NOTE — Progress Notes (Addendum)
NAME:  [name], MRN:  [id number], DOB:  [birth year], LOS: 68 ADMISSION DATE:  09/30/2021, CONSULTATION DATE: 09/30/2021 REFERRING MD: ED provider, CHIEF COMPLAINT: Acute hypoxic respiratory failure  History of Present Illness:  56 yo black female with pmh asthma, dm2, hyperlipidemia, PTSD/anxiety and chronic pancreatitis presented via EMS on cpap machine with sats in the mid 60's. Pt is intubated and sedated so ROS and complete history are unobtainable at this time. All history is obtained from ED and chart review.    Per report pt was at home partaking in cocaine with her husband when she began having sudden and severe sob. She was altered, unable to hold herself up in bed, minimally responsive. She had minimal air movement while on cpap and decision was made to emergently intubate pt. CXR revealed pulmonary edema. BP was noted to be extremely elevated >415 systolic. She was started on nitro infusion.    CCM was consulted for admission 2/2 pt's intubated status.   Pertinent  Medical History  DM2 Asthma Hyperlipidemia PTSD, anxiety Chronic pancreatitis  Significant Hospital Events: Including procedures, antibiotic start and stop dates in addition to other pertinent events   12/23: intubated 12/24: febrile, abx started for CAP 12/25 struggled with sedation - failed ketamine, precedex, dilaudid. Febrile and cultured 12/27 on Dilaudid and Versed drip 12/29 failed weaning this morning with tachypnea 12/31 was extubated 1230, failed extubation within an hour with bronchospasms, stridorous breathing. 01/01 started on empirical antibiotics with vancomycin and cefepime for HCAP 01/02 MRSA screen negative, vancomycin discontinued  Interim History / Subjective:  Patient had a hypoglycemic episode with CBG down to 56 yesterday evening Required increase in FiO2 to 80% overnight due to desaturation Intermittently agitated this morning Spouse consented for trach on Friday  Objective    Blood pressure (!) 153/70, pulse (!) 57, temperature 98.2 F (36.8 C), temperature source Oral, resp. rate (!) 30, height 5\' 6"  (1.676 m), weight 79.8 kg, last menstrual period 03/07/2012, SpO2 96 %.    Vent Mode: PRVC FiO2 (%):  [60 %-80 %] 80 % Set Rate:  [30 bmp] 30 bmp Vt Set:  [410 mL] 410 mL PEEP:  [8 cmH20] 8 cmH20 Plateau Pressure:  [20 cmH20-24 cmH20] 21 cmH20   Intake/Output Summary (Last 24 hours) at 10/12/2021 0748 Last data filed at 10/12/2021 0600 Gross per 24 hour  Intake 2472.7 ml  Output 1350 ml  Net 1122.7 ml   Filed Weights   10/10/21 0500 10/11/21 0500 10/12/21 0400  Weight: 75.2 kg 78.5 kg 79.8 kg    Examination: General: Ill-appearing, sedated and intubated. HENT: ET tube in place.  PERRLA Lungs: CTAB.  No wheezing or rales. Cardiovascular: RRR.  No murmurs rubs or gallops. Abdomen: Soft.  NT/ND.  Normal BS. Extremities: Warm. Well perfused. Neuro: Sedated with occasional agitation.  Moves all extremities.  Labs Reviewed: CBGs: 56->161->155->221 K+ 4.3, WBC 17.5, Hgb 9.3  Resolved Hospital Problem list   Septic shock AKI Hypertensive crisis 2/2 cocaine use Assessment & Plan:  Acute hypoxemic hypercarbic respiratory failure Status post failed extubation 12/30 due to significant bronchospasms. Continues to remain vent dependent, requiring increasing FiO2 to maintain O2 sat above 90%. Family agreeable to transition to trach --Pending tracheostomy on 1/6 --Slowly wean off versed and precedex drips --Continue clonidine, increase scheduled klonopin --Discontinue oxy and start scheduled dilaudid --Start Seroquel 25 mg BID --Continue daily assessment and SBT --VAP bundle  Community-acquired pneumonia Acute pulmonary edema Leukocytosis Status post 5 days of azithromycin and Rocephin for CAP.  CXR on 1/1 showed bilateral airspace disease concerning for pneumonia.  No fever in last 24 hrs. WBC trending down.  --Continue cefepime (day 4/5) --Continue  duonebs --Continue to monitor fever curve, WBC  Diastolic heart failure Echo 12/24 showed EF 65 to 70%, mild LVH and no valvular abnormalities. Inadequate diuresis with IV lasix 40 mg. Remains ~5 L positive since admission.  --Start IV lasix 80 mg BID --Keep K+ >4, Mg>2  ICU delirium History of polysubstance abuse  PTSD --Weaning off sedation --On Dilaudid, klonopin, Zoloft and clonidine --Start Seroquel as above  T2DM, uncontrolled (A1c 9.1) Hypoglycemia episode last night due to decrease in TF. CBGs back up to 150s-220s.  --Continue tube feeds --Increase Novolog to 8 units q4hr, decrease Semglee 5 units to daily --CBG goal 140-180   Best Practice (right click and "Reselect all SmartList Selections" daily)   Diet/type: tubefeeds DVT prophylaxis: LMWH GI prophylaxis: PPI Lines: N/A Foley:  N/A Code Status:  full code Last date of multidisciplinary goals of care discussion [10/12/20, Spoke to spouse at bedside, all questions answered this morning]  Labs   CBC: Recent Labs  Lab 10/07/21 0410 10/07/21 1459 10/08/21 0412 10/09/21 0324 10/09/21 2127 10/10/21 0027 10/10/21 0350 10/11/21 0510  WBC 19.5*  --  22.7* 22.0*  --   --  23.1* 20.0*  NEUTROABS  --   --   --  15.4*  --   --   --   --   HGB 9.6*   < > 9.4* 9.6* 11.6* 7.8* 9.8* 9.6*  HCT 29.9*   < > 30.1* 31.1* 34.0* 23.0* 31.1* 31.0*  MCV 94.9  --  96.5 97.2  --   --  96.6 96.0  PLT 227  --  253 296  --   --  320 349   < > = values in this interval not displayed.    Basic Metabolic Panel: Recent Labs  Lab 10/07/21 0410 10/07/21 1459 10/08/21 0412 10/09/21 0324 10/09/21 2127 10/10/21 0027 10/10/21 0350 10/11/21 0510 10/12/21 0341  NA 142   < > 143 146* 147* 147* 144 141 141  K 3.4*   < > 3.8 3.7 3.5 3.6 3.9 3.9 4.3  CL 105  --  106 107  --   --  107 107 108  CO2 29  --  28 30  --   --  27 25 25   GLUCOSE 199*  --  224* 127*  --   --  98 157* 159*  BUN 27*  --  31* 30*  --   --  26* 24* 25*   CREATININE 0.85  --  0.78 0.79  --   --  0.81 0.85 0.95  CALCIUM 9.0  --  9.3 9.5  --   --  9.2 9.0 9.2  MG 2.5*  --   --   --   --   --  2.2  --  2.4   < > = values in this interval not displayed.   GFR: Estimated Creatinine Clearance: 69.6 mL/min (by C-G formula based on SCr of 0.95 mg/dL). Recent Labs  Lab 10/08/21 0412 10/09/21 0324 10/10/21 0350 10/11/21 0510  WBC 22.7* 22.0* 23.1* 20.0*    Liver Function Tests: No results for input(s): AST, ALT, ALKPHOS, BILITOT, PROT, ALBUMIN in the last 168 hours. No results for input(s): LIPASE, AMYLASE in the last 168 hours. No results for input(s): AMMONIA in the last 168 hours.  ABG    Component Value Date/Time  PHART 7.474 (H) 10/10/2021 0027   PCO2ART 36.2 10/10/2021 0027   PO2ART 71 (L) 10/10/2021 0027   HCO3 26.5 10/10/2021 0027   TCO2 28 10/10/2021 0027   ACIDBASEDEF 1.0 10/02/2021 0625   O2SAT 95.0 10/10/2021 0027     Critical care time: 30

## 2021-10-12 NOTE — Plan of Care (Signed)

## 2021-10-13 ENCOUNTER — Inpatient Hospital Stay (HOSPITAL_COMMUNITY): Payer: Medicare HMO

## 2021-10-13 ENCOUNTER — Other Ambulatory Visit: Payer: Self-pay

## 2021-10-13 DIAGNOSIS — G9341 Metabolic encephalopathy: Secondary | ICD-10-CM | POA: Diagnosis not present

## 2021-10-13 DIAGNOSIS — J9601 Acute respiratory failure with hypoxia: Secondary | ICD-10-CM | POA: Diagnosis not present

## 2021-10-13 DIAGNOSIS — F1410 Cocaine abuse, uncomplicated: Secondary | ICD-10-CM | POA: Diagnosis not present

## 2021-10-13 DIAGNOSIS — J810 Acute pulmonary edema: Secondary | ICD-10-CM | POA: Diagnosis not present

## 2021-10-13 LAB — CBC WITH DIFFERENTIAL/PLATELET
Abs Immature Granulocytes: 0.25 10*3/uL — ABNORMAL HIGH (ref 0.00–0.07)
Basophils Absolute: 0.1 10*3/uL (ref 0.0–0.1)
Basophils Relative: 0 %
Eosinophils Absolute: 0.4 10*3/uL (ref 0.0–0.5)
Eosinophils Relative: 2 %
HCT: 30.3 % — ABNORMAL LOW (ref 36.0–46.0)
Hemoglobin: 9.4 g/dL — ABNORMAL LOW (ref 12.0–15.0)
Immature Granulocytes: 1 %
Lymphocytes Relative: 20 %
Lymphs Abs: 3.8 10*3/uL (ref 0.7–4.0)
MCH: 29.9 pg (ref 26.0–34.0)
MCHC: 31 g/dL (ref 30.0–36.0)
MCV: 96.5 fL (ref 80.0–100.0)
Monocytes Absolute: 1.5 10*3/uL — ABNORMAL HIGH (ref 0.1–1.0)
Monocytes Relative: 8 %
Neutro Abs: 13 10*3/uL — ABNORMAL HIGH (ref 1.7–7.7)
Neutrophils Relative %: 69 %
Platelets: 408 10*3/uL — ABNORMAL HIGH (ref 150–400)
RBC: 3.14 MIL/uL — ABNORMAL LOW (ref 3.87–5.11)
RDW: 13.8 % (ref 11.5–15.5)
WBC: 19 10*3/uL — ABNORMAL HIGH (ref 4.0–10.5)
nRBC: 0 % (ref 0.0–0.2)

## 2021-10-13 LAB — BASIC METABOLIC PANEL
Anion gap: 7 (ref 5–15)
BUN: 29 mg/dL — ABNORMAL HIGH (ref 6–20)
CO2: 25 mmol/L (ref 22–32)
Calcium: 9.3 mg/dL (ref 8.9–10.3)
Chloride: 106 mmol/L (ref 98–111)
Creatinine, Ser: 1.02 mg/dL — ABNORMAL HIGH (ref 0.44–1.00)
GFR, Estimated: >60 mL/min (ref >60)
Glucose, Bld: 145 mg/dL — ABNORMAL HIGH (ref 70–99)
Potassium: 3.4 mmol/L — ABNORMAL LOW (ref 3.5–5.1)
Sodium: 138 mmol/L (ref 135–145)

## 2021-10-13 LAB — GLUCOSE, CAPILLARY
Glucose-Capillary: 139 mg/dL — ABNORMAL HIGH (ref 70–99)
Glucose-Capillary: 155 mg/dL — ABNORMAL HIGH (ref 70–99)
Glucose-Capillary: 92 mg/dL (ref 70–99)
Glucose-Capillary: 190 mg/dL — ABNORMAL HIGH (ref 70–99)
Glucose-Capillary: 135 mg/dL — ABNORMAL HIGH (ref 70–99)
Glucose-Capillary: 153 mg/dL — ABNORMAL HIGH (ref 70–99)

## 2021-10-13 LAB — MAGNESIUM: Magnesium: 2.2 mg/dL (ref 1.7–2.4)

## 2021-10-13 MED ORDER — QUETIAPINE FUMARATE 50 MG PO TABS
50.0000 mg | ORAL_TABLET | Freq: Two times a day (BID) | ORAL | Status: DC
  Administered 2021-10-13 – 2021-10-17 (×9): 50 mg
  Filled 2021-10-13 (×9): qty 1

## 2021-10-13 MED ORDER — CLONIDINE HCL 0.2 MG PO TABS: 0.2000 mg | ORAL_TABLET | Freq: Every day | ORAL | Status: DC

## 2021-10-13 MED ORDER — CLONIDINE HCL 0.2 MG PO TABS
0.2000 mg | ORAL_TABLET | Freq: Four times a day (QID) | ORAL | Status: AC
  Administered 2021-10-13 – 2021-10-14 (×3): 0.2 mg
  Filled 2021-10-13 (×3): qty 1

## 2021-10-13 MED ORDER — CLONIDINE HCL 0.2 MG PO TABS
0.2000 mg | ORAL_TABLET | Freq: Four times a day (QID) | ORAL | Status: DC
  Administered 2021-10-13: 0.2 mg
  Filled 2021-10-13: qty 1

## 2021-10-13 MED ORDER — CLONIDINE HCL 0.2 MG PO TABS
0.1000 mg | ORAL_TABLET | Freq: Every day | ORAL | Status: AC
  Administered 2021-10-16: 0.1 mg
  Filled 2021-10-13: qty 1

## 2021-10-13 MED ORDER — POTASSIUM CHLORIDE 20 MEQ PO PACK
40.0000 meq | PACK | Freq: Once | ORAL | Status: AC
  Administered 2021-10-13: 40 meq
  Filled 2021-10-13: qty 2

## 2021-10-13 MED ORDER — CLONIDINE HCL 0.2 MG PO TABS: 0.1000 mg | ORAL_TABLET | Freq: Two times a day (BID) | ORAL | Status: DC

## 2021-10-13 MED ORDER — CLONIDINE HCL 0.2 MG PO TABS
0.1000 mg | ORAL_TABLET | Freq: Four times a day (QID) | ORAL | Status: AC
  Administered 2021-10-14 – 2021-10-15 (×2): 0.1 mg
  Filled 2021-10-13 (×2): qty 1

## 2021-10-13 MED ORDER — NOREPINEPHRINE 4 MG/250ML-% IV SOLN
0.0000 ug/min | INTRAVENOUS | Status: DC
  Administered 2021-10-13: 2 ug/min via INTRAVENOUS
  Administered 2021-10-13: 4 ug/min via INTRAVENOUS
  Administered 2021-10-14: 6 ug/min via INTRAVENOUS
  Administered 2021-10-14: 8 ug/min via INTRAVENOUS
  Administered 2021-10-15 – 2021-10-16 (×2): 2 ug/min via INTRAVENOUS
  Filled 2021-10-13 (×5): qty 250

## 2021-10-13 MED ORDER — FUROSEMIDE 10 MG/ML IJ SOLN
80.0000 mg | Freq: Two times a day (BID) | INTRAMUSCULAR | Status: AC
  Administered 2021-10-13 (×2): 80 mg via INTRAVENOUS
  Filled 2021-10-13 (×2): qty 8

## 2021-10-13 MED ORDER — CLONIDINE HCL 0.2 MG PO TABS: 0.1000 mg | ORAL_TABLET | Freq: Four times a day (QID) | ORAL | Status: DC

## 2021-10-13 MED ORDER — CLONIDINE HCL 0.2 MG PO TABS
0.1000 mg | ORAL_TABLET | Freq: Two times a day (BID) | ORAL | Status: AC
  Administered 2021-10-15 (×2): 0.1 mg
  Filled 2021-10-13 (×2): qty 1

## 2021-10-13 NOTE — Progress Notes (Signed)
NAME:  [name], MRN:  [id number], DOB:  [birth year], LOS: 75 ADMISSION DATE:  09/30/2021, CONSULTATION DATE: 09/30/2021 REFERRING MD: ED provider, CHIEF COMPLAINT: Acute hypoxic respiratory failure  History of Present Illness:  56 yo black female with pmh asthma, dm2, hyperlipidemia, PTSD/anxiety and chronic pancreatitis presented via EMS on cpap machine with sats in the mid 60's. Pt is intubated and sedated so ROS and complete history are unobtainable at this time. All history is obtained from ED and chart review.    Per report pt was at home partaking in cocaine with her husband when she began having sudden and severe sob. She was altered, unable to hold herself up in bed, minimally responsive. She had minimal air movement while on cpap and decision was made to emergently intubate pt. CXR revealed pulmonary edema. BP was noted to be extremely elevated >637 systolic. She was started on nitro infusion.    CCM was consulted for admission 2/2 pt's intubated status.   Pertinent  Medical History  DM2 Asthma Hyperlipidemia PTSD, anxiety Chronic pancreatitis  Significant Hospital Events: Including procedures, antibiotic start and stop dates in addition to other pertinent events   12/23: intubated 12/24: febrile, abx started for CAP 12/25 struggled with sedation - failed ketamine, precedex, dilaudid. Febrile and cultured 12/27 on Dilaudid and Versed drip 12/29 failed weaning this morning with tachypnea 12/31 was extubated 1230, failed extubation within an hour with bronchospasms, stridorous breathing. 01/01 started on empirical antibiotics with vancomycin and cefepime for HCAP 01/02 MRSA screen negative, vancomycin discontinued 01/04 Hypotensive episode, started on Levophed  Interim History / Subjective:  Patient had hypotensive episode overnight after boluses of dilaudid and versed. Started on Levophed Mild agitation this AM  Objective   Blood pressure (!) 100/57, pulse 71,  temperature 100.3 F (37.9 C), temperature source Axillary, resp. rate (!) 30, height 5\' 6"  (1.676 m), weight 79.8 kg, last menstrual period 03/07/2012, SpO2 100 %. CVP:  [11 mmHg] 11 mmHg  Vent Mode: PRVC FiO2 (%):  [70 %-90 %] 80 % Set Rate:  [30 bmp] 30 bmp Vt Set:  [410 mL] 410 mL PEEP:  [8 cmH20] 8 cmH20 Plateau Pressure:  [21 cmH20-26 cmH20] 26 cmH20   Intake/Output Summary (Last 24 hours) at 10/13/2021 0713 Last data filed at 10/13/2021 0600 Gross per 24 hour  Intake 1460.22 ml  Output 3490 ml  Net -2029.78 ml   Filed Weights   10/10/21 0500 10/11/21 0500 10/12/21 0400  Weight: 75.2 kg 78.5 kg 79.8 kg    Examination: General: Ill-appearing, mild agitation, trying to pull out tube.  HENT: ET tube in place.  PERRLA Lungs: CTAB.  No wheezing or rales. Cardiovascular: RRR.  No murmurs rubs or gallops. Abdomen: Soft.  NT/ND.  Normal BS. Extremities: Warm. Well perfused. Neuro: Agitated. RASS +2. Moves all extremities.  Labs Reviewed: K+ 3.4, sCr 1.02 WBC 19, hgb 9.4 CBG 92-155  Resolved Hospital Problem list   Septic shock AKI Hypertensive crisis 2/2 cocaine use Assessment & Plan:  Acute hypoxemic hypercarbic respiratory failure Status post failed extubation 12/30 due to significant bronchospasms. Continues to remain vent dependent. Did not meet SBT due to high FiO2 of 70%. --Pending tracheostomy on 1/6 w/ Dr. Tamala Julian --Off precedex, slowly wean off versed --Continue dilaudid and klonopin --Wean down clonidine --Increase Seroquel to 50 mg BID --Continue daily assessment and SBT --VAP bundle  Community-acquired pneumonia Acute pulmonary edema Leukocytosis Status post 5 days of azithromycin and Rocephin for CAP. CXR on 1/1 showed bilateral airspace  disease concerning for pneumonia. Fever of 100.6 overnight.  WC slightly up to 19.0. Will finish abx today and monitor fever curve.  --Continue cefepime (day 5/5) --Continue duonebs --Continue to monitor fever curve,  WBC  Hypotension BP of 61/34 overnight in the setting increased sedation meds. Started on levophed with improvement, SBP in the 100s-120s, MAPs 67-76. --Continue levophed, now at 3 mg/hr --CTM  Diastolic heart failure Echo 12/24 showed EF 65 to 70%, mild LVH and no valvular abnormalities. Responding well to IV diuresing. UOP of 3.5 L over the last 5 hr. Still net +2.7 since admission. K+ 3.4 --Continue IV lasix 80 mg BID --Replete K --Keep K+ >4, Mg>2  ICU delirium History of polysubstance abuse  PTSD --Weaning off sedation --On Dilaudid, klonopin, Zoloft and clonidine --Continue Seroquel as above  T2DM, uncontrolled (A1c 9.1) CBGs 92-167 in the last 24 hrs. Had an episode of emesis yesterday due to displacement of NGT. TF on trickle feeds overnight --Continue tube feeds, advance to goal --Continue Novolog to 8 units q4hr, Semglee 5 units to daily --Continue reglan  --CBG goal 140-180   Best Practice (right click and "Reselect all SmartList Selections" daily)   Diet/type: tubefeeds DVT prophylaxis: LMWH GI prophylaxis: PPI Lines: N/A Foley:  N/A Code Status:  full code Last date of multidisciplinary goals of care discussion [10/13/20, Spoke to spouse at bedside, all questions answered this morning]  Labs   CBC: Recent Labs  Lab 10/09/21 0324 10/09/21 2127 10/10/21 0027 10/10/21 0350 10/11/21 0510 10/12/21 0744 10/13/21 0455  WBC 22.0*  --   --  23.1* 20.0* 17.5* 19.0*  NEUTROABS 15.4*  --   --   --   --   --  13.0*  HGB 9.6*   < > 7.8* 9.8* 9.6* 9.3* 9.4*  HCT 31.1*   < > 23.0* 31.1* 31.0* 30.6* 30.3*  MCV 97.2  --   --  96.6 96.0 98.1 96.5  PLT 296  --   --  320 349 361 408*   < > = values in this interval not displayed.    Basic Metabolic Panel: Recent Labs  Lab 10/07/21 0410 10/07/21 1459 10/09/21 0324 10/09/21 2127 10/10/21 0027 10/10/21 0350 10/11/21 0510 10/12/21 0341 10/13/21 0455  NA 142   < > 146*   < > 147* 144 141 141 138  K 3.4*   < >  3.7   < > 3.6 3.9 3.9 4.3 3.4*  CL 105   < > 107  --   --  107 107 108 106  CO2 29   < > 30  --   --  27 25 25 25   GLUCOSE 199*   < > 127*  --   --  98 157* 159* 145*  BUN 27*   < > 30*  --   --  26* 24* 25* 29*  CREATININE 0.85   < > 0.79  --   --  0.81 0.85 0.95 1.02*  CALCIUM 9.0   < > 9.5  --   --  9.2 9.0 9.2 9.3  MG 2.5*  --   --   --   --  2.2  --  2.4 2.2   < > = values in this interval not displayed.   GFR: Estimated Creatinine Clearance: 64.8 mL/min (A) (by C-G formula based on SCr of 1.02 mg/dL (H)). Recent Labs  Lab 10/10/21 0350 10/11/21 0510 10/12/21 0744 10/13/21 0455  WBC 23.1* 20.0* 17.5* 19.0*    Liver  Function Tests: No results for input(s): AST, ALT, ALKPHOS, BILITOT, PROT, ALBUMIN in the last 168 hours. No results for input(s): LIPASE, AMYLASE in the last 168 hours. No results for input(s): AMMONIA in the last 168 hours.  ABG    Component Value Date/Time   PHART 7.474 (H) 10/10/2021 0027   PCO2ART 36.2 10/10/2021 0027   PO2ART 71 (L) 10/10/2021 0027   HCO3 26.5 10/10/2021 0027   TCO2 28 10/10/2021 0027   ACIDBASEDEF 1.0 10/02/2021 0625   O2SAT 95.0 10/10/2021 0027     Critical care time: 30

## 2021-10-13 NOTE — Progress Notes (Signed)
RT NOTE: patient did not meet SBT criteria this AM due to FIO2 requirements.  Patient tolerating current ventilator settings well at this time.

## 2021-10-13 NOTE — Progress Notes (Signed)
[facility] Progress Note Patient Name: [name] DOB: 1966/06/19 MRN: [id number]   Date of Service  10/13/2021  HPI/Events of Note  Nursing request for AM lab and CXR orders.  eICU Interventions  Plan: CBC with platelets, BMP and Mg++ level at 5 AM. Portable CXR at 5 AM.     Intervention Category Major Interventions: Other:  Lysle Dingwall 10/13/2021, 3:19 AM

## 2021-10-13 NOTE — Progress Notes (Signed)
[facility] Progress Note Patient Name: [name] DOB: 11/10/1965 MRN: [id number]   Date of Service  10/13/2021  HPI/Events of Note  Hypotension - BP = 61/34 post boluses with Dilaudid and Versed for ventilator asynchrony. CVP = 12. LVEF = 60-65%. Aortic Stenosis noted on cardiac echo reading.   eICU Interventions  Plan: Norepinephrine IV infusion. Titrate to MAP >= 65. Increase ceiling on Versed IV infusion to 8 mg/hour.  Use agents which cause vasodilation with caution in setting of Aortic Stenosis.      Intervention Category Major Interventions: Hypotension - evaluation and management  [name] Eugene 10/13/2021, 2:00 AM

## 2021-10-13 NOTE — Progress Notes (Signed)
PT's husband voiced concerns over tracheostomy that is planned for tomorrow. He states that he feels like she was not given a chance to wean off of vent "y'all kept her doped upon these drugs (Dilaudid and Versed)". PT's husband also made statement "they are trying to get money off of our insurance for this (trach)".

## 2021-10-13 NOTE — Progress Notes (Signed)
PT vomited small/moderate amount of clear/green colored emesis. There were no desaturations. This happened shortly after ETT inline suctioning.

## 2021-10-14 ENCOUNTER — Inpatient Hospital Stay (HOSPITAL_COMMUNITY): Payer: Medicare HMO

## 2021-10-14 DIAGNOSIS — R609 Edema, unspecified: Secondary | ICD-10-CM | POA: Diagnosis not present

## 2021-10-14 DIAGNOSIS — J9601 Acute respiratory failure with hypoxia: Secondary | ICD-10-CM | POA: Diagnosis not present

## 2021-10-14 LAB — BASIC METABOLIC PANEL
Anion gap: 9 (ref 5–15)
BUN: 26 mg/dL — ABNORMAL HIGH (ref 6–20)
CO2: 28 mmol/L (ref 22–32)
Calcium: 9.2 mg/dL (ref 8.9–10.3)
Chloride: 102 mmol/L (ref 98–111)
Creatinine, Ser: 1.02 mg/dL — ABNORMAL HIGH (ref 0.44–1.00)
GFR, Estimated: >60 mL/min (ref >60)
Glucose, Bld: 116 mg/dL — ABNORMAL HIGH (ref 70–99)
Potassium: 3.3 mmol/L — ABNORMAL LOW (ref 3.5–5.1)
Sodium: 139 mmol/L (ref 135–145)

## 2021-10-14 LAB — GLUCOSE, CAPILLARY
Glucose-Capillary: 94 mg/dL (ref 70–99)
Glucose-Capillary: 165 mg/dL — ABNORMAL HIGH (ref 70–99)
Glucose-Capillary: 177 mg/dL — ABNORMAL HIGH (ref 70–99)
Glucose-Capillary: 160 mg/dL — ABNORMAL HIGH (ref 70–99)
Glucose-Capillary: 249 mg/dL — ABNORMAL HIGH (ref 70–99)
Glucose-Capillary: 219 mg/dL — ABNORMAL HIGH (ref 70–99)

## 2021-10-14 LAB — CBC
HCT: 31.9 % — ABNORMAL LOW (ref 36.0–46.0)
Hemoglobin: 9.7 g/dL — ABNORMAL LOW (ref 12.0–15.0)
MCH: 29.6 pg (ref 26.0–34.0)
MCHC: 30.4 g/dL (ref 30.0–36.0)
MCV: 97.3 fL (ref 80.0–100.0)
Platelets: 460 10*3/uL — ABNORMAL HIGH (ref 150–400)
RBC: 3.28 MIL/uL — ABNORMAL LOW (ref 3.87–5.11)
RDW: 13.5 % (ref 11.5–15.5)
WBC: 15.3 10*3/uL — ABNORMAL HIGH (ref 4.0–10.5)
nRBC: 0 % (ref 0.0–0.2)

## 2021-10-14 LAB — PHOSPHORUS: Phosphorus: 4.1 mg/dL (ref 2.5–4.6)

## 2021-10-14 LAB — MAGNESIUM: Magnesium: 2.1 mg/dL (ref 1.7–2.4)

## 2021-10-14 MED ORDER — DEXAMETHASONE SODIUM PHOSPHATE 4 MG/ML IJ SOLN
2.0000 mg | Freq: Once | INTRAMUSCULAR | Status: AC
  Administered 2021-10-14: 2 mg via INTRAVENOUS
  Filled 2021-10-14: qty 1

## 2021-10-14 MED ORDER — NALOXONE HCL 2 MG/2ML IJ SOSY
2.0000 mg | PREFILLED_SYRINGE | Freq: Once | INTRAMUSCULAR | Status: DC
  Filled 2021-10-14: qty 2

## 2021-10-14 MED ORDER — ETOMIDATE 2 MG/ML IV SOLN
INTRAVENOUS | Status: AC
  Administered 2021-10-14: 20 mg
  Filled 2021-10-14: qty 20

## 2021-10-14 MED ORDER — NALOXONE HCL 0.4 MG/ML IJ SOLN
INTRAMUSCULAR | Status: AC
  Administered 2021-10-14: 0.8 mg
  Filled 2021-10-14: qty 2

## 2021-10-14 MED ORDER — FENTANYL CITRATE (PF) 100 MCG/2ML IJ SOLN
INTRAMUSCULAR | Status: AC
  Administered 2021-10-14: 100 ug
  Filled 2021-10-14: qty 2

## 2021-10-14 MED ORDER — MIDAZOLAM HCL 2 MG/2ML IJ SOLN
INTRAMUSCULAR | Status: AC
  Filled 2021-10-14: qty 2

## 2021-10-14 MED ORDER — PROCHLORPERAZINE EDISYLATE 10 MG/2ML IJ SOLN
10.0000 mg | Freq: Four times a day (QID) | INTRAMUSCULAR | Status: DC | PRN
  Administered 2021-10-14 – 2021-10-18 (×2): 10 mg via INTRAVENOUS
  Filled 2021-10-14 (×4): qty 2

## 2021-10-14 MED ORDER — ROCURONIUM BROMIDE 10 MG/ML (PF) SYRINGE
PREFILLED_SYRINGE | INTRAVENOUS | Status: AC
  Administered 2021-10-14: 100 mg
  Filled 2021-10-14: qty 10

## 2021-10-14 MED ORDER — HYDROMORPHONE HCL 2 MG PO TABS
2.0000 mg | ORAL_TABLET | ORAL | Status: DC | PRN
  Administered 2021-10-17 – 2021-10-21 (×3): 2 mg
  Filled 2021-10-14 (×3): qty 1

## 2021-10-14 MED ORDER — ENOXAPARIN SODIUM 40 MG/0.4ML IJ SOSY
40.0000 mg | PREFILLED_SYRINGE | INTRAMUSCULAR | Status: DC
  Administered 2021-10-14 – 2021-10-26 (×13): 40 mg via SUBCUTANEOUS
  Filled 2021-10-14 (×13): qty 0.4

## 2021-10-14 MED ORDER — RACEPINEPHRINE HCL 2.25 % IN NEBU
INHALATION_SOLUTION | RESPIRATORY_TRACT | Status: AC
  Filled 2021-10-14: qty 0.5

## 2021-10-14 MED ORDER — RACEPINEPHRINE HCL 2.25 % IN NEBU
0.5000 mL | INHALATION_SOLUTION | Freq: Once | RESPIRATORY_TRACT | Status: AC
  Administered 2021-10-14: 0.5 mL via RESPIRATORY_TRACT

## 2021-10-14 MED ORDER — POTASSIUM CHLORIDE 10 MEQ/50ML IV SOLN
10.0000 meq | INTRAVENOUS | Status: AC
  Administered 2021-10-14 (×6): 10 meq via INTRAVENOUS
  Filled 2021-10-14 (×6): qty 50

## 2021-10-14 NOTE — Progress Notes (Signed)
[facility] Progress Note Patient Name: [name] DOB: 04-Aug-1966 MRN: [id number]   Date of Service  10/14/2021  HPI/Events of Note  Nausea - QTc interval =  0.50 seconds.   eICU Interventions  Plan: D/C Zofran. Compazine 10 mg IV Q 6 hours PRN N/V.     Intervention Category Major Interventions: Other:  [name] 10/14/2021, 3:20 AM

## 2021-10-14 NOTE — Progress Notes (Signed)
Lower extremity venous has been completed.   Preliminary results in CV Proc.   [name] 10/14/2021 11:26 AM

## 2021-10-14 NOTE — Progress Notes (Signed)
Unable to obtain post intubation abg. Attempted  x2, but PT Is non compliant.

## 2021-10-14 NOTE — Procedures (Signed)
Extubation Procedure Note  Patient Details:   Name: [name] DOB: 1966/09/24 MRN: [id number]   Airway Documentation:    Vent end date: 10/14/21 Vent end time: 1148   Evaluation  O2 sats: stable throughout Complications: No apparent complications Patient did tolerate procedure well. Bilateral Breath Sounds: Rhonchi   No  Patient extubated to bipap per MD order.  Positive cuff leak noted.  No evidence of stridor.  Patient still lethargic therefore did not speak.  At this time, patient is tolerating bipap well.  Will continue to monitor.   Judith Part 10/14/2021, 11:55 AM

## 2021-10-14 NOTE — Progress Notes (Signed)
K+3.3 ?Replaced per protocol  ?

## 2021-10-14 NOTE — Procedures (Addendum)
Intubation Procedure Note  [name]  [id number]  [birth year]  Date:10/14/21  Time:6:14 PM   Provider Performing:Reubin Bushnell B [name]    Procedure: Intubation (31500)  Indication(s) Respiratory Failure  Consent Risks of the procedure as well as the alternatives and risks of each were explained to the patient and/or caregiver.  Consent for the procedure was obtained and is signed in the bedside chart   Anesthesia Etomidate, Versed, Fentanyl, and Rocuronium, per Dr. Tamala Julian   Time Out Verified patient identification, verified procedure, site/side was marked, verified correct patient position, special equipment/implants available, medications/allergies/relevant history reviewed, required imaging and test results available.   Sterile Technique Usual hand hygeine, masks, and gloves were used   Procedure Description Patient positioned in bed supine.  Sedation given as noted above.  Patient was intubated with endotracheal tube using Glidescope (Mac 4, size 8.0 ETT at 25 at lips).  View was Grade 1 full glottis .  Number of attempts was 1.  Colorimetric CO2 detector was consistent with tracheal placement.  Dr. Tamala Julian present for intubation.   Complications/Tolerance None; patient tolerated the procedure well. Chest X-ray is ordered to verify placement.   EBL NA   Specimen(s) None

## 2021-10-14 NOTE — Progress Notes (Signed)
After discussion with husband, trial of extubation to BIPAP, if fails go straight to tracheostomy.  There has been some odd behavior on part of husband with frequent trips out of unit and arriving back altered.  He also complains we are keeping her too sedated during some discussions and not controlling her pain enough during others.  AC/security has been notified.  For now, everyone seems okay with plan.  Erskine Emery MD PCCM

## 2021-10-14 NOTE — Progress Notes (Signed)
10/14/2021  More stridor post extubation.  Confused.  Spoke with husband, we agree to intubate and trach in AM to facilitate vent weaning.  Erskine Emery MD

## 2021-10-14 NOTE — Progress Notes (Signed)
NAME:  [name], MRN:  [id number], DOB:  05/15/1966, LOS: 34 ADMISSION DATE:  09/30/2021, CONSULTATION DATE: 09/30/2021 REFERRING MD: ED provider, CHIEF COMPLAINT: Acute hypoxic respiratory failure  History of Present Illness:  56 yo black female with pmh asthma, dm2, hyperlipidemia, PTSD/anxiety and chronic pancreatitis presented via EMS on cpap machine with sats in the mid 60's. Pt is intubated and sedated so ROS and complete history are unobtainable at this time. All history is obtained from ED and chart review.    Per report pt was at home partaking in cocaine with her husband when she began having sudden and severe sob. She was altered, unable to hold herself up in bed, minimally responsive. She had minimal air movement while on cpap and decision was made to emergently intubate pt. CXR revealed pulmonary edema. BP was noted to be extremely elevated >294 systolic. She was started on nitro infusion.    CCM was consulted for admission 2/2 pt's intubated status.   Pertinent  Medical History  DM2 Asthma Hyperlipidemia PTSD, anxiety Chronic pancreatitis  Significant Hospital Events: Including procedures, antibiotic start and stop dates in addition to other pertinent events   12/23: intubated 12/24: febrile, abx started for CAP 12/25 struggled with sedation - failed ketamine, precedex, dilaudid. Febrile and cultured 12/27 on Dilaudid and Versed drip 12/29 failed weaning this morning with tachypnea 12/31 was extubated 1230, failed extubation within an hour with bronchospasms, stridorous breathing. 01/01 started on empirical antibiotics with vancomycin and cefepime for HCAP 01/02 MRSA screen negative, vancomycin discontinued  Interim History / Subjective:  Remains on high dose sedation, vent, husband at bedside.  Objective   Blood pressure (!) 105/52, pulse 79, temperature (!) 100.9 F (38.3 C), temperature source Axillary, resp. rate (!) 30, height 5\' 6"  (1.676 m), weight 77.6  kg, last menstrual period 03/07/2012, SpO2 97 %.    Vent Mode: PRVC FiO2 (%):  [40 %-70 %] 40 % Set Rate:  [30 bmp] 30 bmp Vt Set:  [410 mL] 410 mL PEEP:  [8 cmH20] 8 cmH20 Plateau Pressure:  [23 cmH20-24 cmH20] 23 cmH20   Intake/Output Summary (Last 24 hours) at 10/14/2021 7654 Last data filed at 10/14/2021 6503 Gross per 24 hour  Intake 1340.23 ml  Output 3300 ml  Net -1959.77 ml    Filed Weights   10/11/21 0500 10/12/21 0400 10/14/21 0500  Weight: 78.5 kg 79.8 kg 77.6 kg    Examination: Agitated woman on vent Moderate frothy secretions Pulls good volumes on PS  No new imaging Net neg 1.9L BMP okay, K replaced CBC stable Resp culture normal flora   Resolved Hospital Problem list   Septic shock AKI Hypertensive crisis 2/2 cocaine use Assessment & Plan:  Acute hypoxemic hypercarbic respiratory failure ICU delirium History of polysubstance abuse  PTSD Status post failed extubation 12/30 due to significant bronchospasms. Ongoing issues with agitation.  Husband seems on fence about tracheostomy. --Wean versed and fentanyl drips --Continue scheduled dilaudid/clonidine/klonipin/seroquel --Continue daily assessment and SBT --VAP bundle --Husband needs to decide on extubation trial to BIPAP vs. Proceeding directly to tracheostomy, resident MD to re-address  Community-acquired pneumonia Acute pulmonary edema Leukocytosis Status post 5 days of azithromycin and Rocephin for CAP. CXR on 1/1 showed bilateral airspace disease concerning for HCAP.  Culture neg.   --Continue cefepime (day 5/5) --Continue duonebs --Continue to monitor fever curve, WBC  Diastolic heart failure Echo 12/24 showed EF 65 to 70%, mild LVH and no valvular abnormalities. Inadequate diuresis with IV lasix 40 mg. Remains ~  5 L positive since admission.  --Continue IV lasix 80 mg BID --Keep K+ >4, Mg>2  T2DM, uncontrolled (A1c 9.1) --Continue tube feeds --Novolog to 8 units q4hr, decrease Semglee 5  units to daily --CBG goal 100-180   Best Practice (right click and "Reselect all SmartList Selections" daily)   Diet/type: tubefeeds DVT prophylaxis: LMWH GI prophylaxis: PPI Lines: N/A Foley:  N/A Code Status:  full code Last date of multidisciplinary goals of care discussion [10/12/20, Spoke to spouse at bedside, all questions answered this morning]   Patient critically ill due to resp failure Interventions to address this today vent titration, family discussions Risk of deterioration without these interventions is high  I personally spent 41 minutes providing critical care not including any separately billable procedures  Erskine Emery MD [facility]  Prefer epic messenger for cross cover needs If after hours, please call E-link

## 2021-10-15 ENCOUNTER — Inpatient Hospital Stay (HOSPITAL_COMMUNITY): Payer: Medicare HMO

## 2021-10-15 DIAGNOSIS — J9601 Acute respiratory failure with hypoxia: Secondary | ICD-10-CM | POA: Diagnosis not present

## 2021-10-15 LAB — GLUCOSE, CAPILLARY
Glucose-Capillary: 153 mg/dL — ABNORMAL HIGH (ref 70–99)
Glucose-Capillary: 135 mg/dL — ABNORMAL HIGH (ref 70–99)
Glucose-Capillary: 138 mg/dL — ABNORMAL HIGH (ref 70–99)
Glucose-Capillary: 142 mg/dL — ABNORMAL HIGH (ref 70–99)
Glucose-Capillary: 160 mg/dL — ABNORMAL HIGH (ref 70–99)
Glucose-Capillary: 231 mg/dL — ABNORMAL HIGH (ref 70–99)

## 2021-10-15 LAB — POCT I-STAT 7, (LYTES, BLD GAS, ICA,H+H)
Acid-Base Excess: 3 mmol/L — ABNORMAL HIGH (ref 0.0–2.0)
Bicarbonate: 25.7 mmol/L (ref 20.0–28.0)
Calcium, Ion: 1.34 mmol/L (ref 1.15–1.40)
HCT: 28 % — ABNORMAL LOW (ref 36.0–46.0)
Hemoglobin: 9.5 g/dL — ABNORMAL LOW (ref 12.0–15.0)
O2 Saturation: 92 %
Patient temperature: 98.6
Potassium: 3.5 mmol/L (ref 3.5–5.1)
Sodium: 143 mmol/L (ref 135–145)
TCO2: 27 mmol/L (ref 22–32)
pCO2 arterial: 31.1 mmHg — ABNORMAL LOW (ref 32.0–48.0)
pH, Arterial: 7.525 — ABNORMAL HIGH (ref 7.350–7.450)
pO2, Arterial: 55 mmHg — ABNORMAL LOW (ref 83.0–108.0)

## 2021-10-15 LAB — BASIC METABOLIC PANEL
Anion gap: 7 (ref 5–15)
BUN: 20 mg/dL (ref 6–20)
CO2: 25 mmol/L (ref 22–32)
Calcium: 9.9 mg/dL (ref 8.9–10.3)
Chloride: 108 mmol/L (ref 98–111)
Creatinine, Ser: 1 mg/dL (ref 0.44–1.00)
GFR, Estimated: >60 mL/min (ref >60)
Glucose, Bld: 161 mg/dL — ABNORMAL HIGH (ref 70–99)
Potassium: 3.8 mmol/L (ref 3.5–5.1)
Sodium: 140 mmol/L (ref 135–145)

## 2021-10-15 LAB — CBC
HCT: 31.2 % — ABNORMAL LOW (ref 36.0–46.0)
Hemoglobin: 9.7 g/dL — ABNORMAL LOW (ref 12.0–15.0)
MCH: 29.7 pg (ref 26.0–34.0)
MCHC: 31.1 g/dL (ref 30.0–36.0)
MCV: 95.4 fL (ref 80.0–100.0)
Platelets: 443 10*3/uL — ABNORMAL HIGH (ref 150–400)
RBC: 3.27 MIL/uL — ABNORMAL LOW (ref 3.87–5.11)
RDW: 13.2 % (ref 11.5–15.5)
WBC: 18 10*3/uL — ABNORMAL HIGH (ref 4.0–10.5)
nRBC: 0 % (ref 0.0–0.2)

## 2021-10-15 LAB — MAGNESIUM: Magnesium: 2.4 mg/dL (ref 1.7–2.4)

## 2021-10-15 LAB — PROTIME-INR
INR: 1.2 (ref 0.8–1.2)
Prothrombin Time: 15 seconds (ref 11.4–15.2)

## 2021-10-15 LAB — PHOSPHORUS: Phosphorus: 2 mg/dL — ABNORMAL LOW (ref 2.5–4.6)

## 2021-10-15 MED ORDER — K PHOS MONO-SOD PHOS DI & MONO 155-852-130 MG PO TABS
500.0000 mg | ORAL_TABLET | Freq: Three times a day (TID) | ORAL | Status: AC
  Administered 2021-10-15 – 2021-10-16 (×6): 500 mg
  Filled 2021-10-15 (×6): qty 2

## 2021-10-15 MED ORDER — MIDAZOLAM HCL 2 MG/2ML IJ SOLN
5.0000 mg | Freq: Once | INTRAMUSCULAR | Status: AC
  Administered 2021-10-15: 5 mg via INTRAVENOUS
  Filled 2021-10-15: qty 6

## 2021-10-15 MED ORDER — FUROSEMIDE 10 MG/ML IJ SOLN
60.0000 mg | Freq: Three times a day (TID) | INTRAMUSCULAR | Status: AC
  Administered 2021-10-15 (×2): 60 mg via INTRAVENOUS
  Filled 2021-10-15 (×2): qty 6

## 2021-10-15 MED ORDER — ROCURONIUM BROMIDE 10 MG/ML (PF) SYRINGE
60.0000 mg | PREFILLED_SYRINGE | Freq: Once | INTRAVENOUS | Status: AC
  Administered 2021-10-15: 60 mg via INTRAVENOUS
  Filled 2021-10-15: qty 10

## 2021-10-15 MED ORDER — FENTANYL CITRATE (PF) 100 MCG/2ML IJ SOLN
200.0000 ug | Freq: Once | INTRAMUSCULAR | Status: AC
  Administered 2021-10-15: 200 ug via INTRAVENOUS
  Filled 2021-10-15: qty 4

## 2021-10-15 MED ORDER — ETOMIDATE 2 MG/ML IV SOLN
20.0000 mg | Freq: Once | INTRAVENOUS | Status: AC
  Administered 2021-10-15: 20 mg via INTRAVENOUS
  Filled 2021-10-15: qty 10

## 2021-10-15 NOTE — Procedures (Signed)
Bronchoscopy Procedure Note  [name]  [id number]  1966-05-22  Date:10/15/21  Time:11:42 AM   Provider Performing:[name]   Procedure(s):  Flexible bronchoscopy with bronchial alveolar lavage [phone]) and bronchoscopy for trach assist  Indication(s) RLL pneumonia Respiratory failure requiring tracheostomy   Consent Risks of the procedure as well as the alternatives and risks of each were explained to the patient and/or caregiver.  Consent for the procedure was obtained and is signed in the bedside chart  Anesthesia Continuous sedation ongoing   Time Out Verified patient identification, verified procedure, site/side was marked, verified correct patient position, special equipment/implants available, medications/allergies/relevant history reviewed, required imaging and test results available.   Sterile Technique Usual hand hygiene, masks, gowns, and gloves were used   Procedure Description Bronchoscope advanced through endotracheal tube and into airway.  Airways were examined down to subsegmental level with findings noted below.   Following diagnostic evaluation, BAL(s) performed in RLL with normal saline and return of 15 ml fluid   Complications/Tolerance None; patient tolerated the procedure well. Chest X-ray is needed post procedure.   EBL Minimal   Specimen(s) BAL RLL send for culture and gram stain   Dr. Tamala Brock present for procedure      [name], ACNP [facility] 10/15/2021, 11:45 AM  See Amion for pager If no response to pager, please call PCCM consult pager After 7:00 pm call Elink

## 2021-10-15 NOTE — Progress Notes (Signed)
NAME:  [name], MRN:  [id number], DOB:  28-Jun-1966, LOS: 67 ADMISSION DATE:  09/30/2021, CONSULTATION DATE: 09/30/2021 REFERRING MD: ED provider, CHIEF COMPLAINT: Acute hypoxic respiratory failure  History of Present Illness:  56 yo black female with pmh asthma, dm2, hyperlipidemia, PTSD/anxiety and chronic pancreatitis presented via EMS on cpap machine with sats in the mid 60's. Pt is intubated and sedated so ROS and complete history are unobtainable at this time. All history is obtained from ED and chart review.    Per report pt was at home partaking in cocaine with her husband when she began having sudden and severe sob. She was altered, unable to hold herself up in bed, minimally responsive. She had minimal air movement while on cpap and decision was made to emergently intubate pt. CXR revealed pulmonary edema. BP was noted to be extremely elevated >431 systolic. She was started on nitro infusion.    CCM was consulted for admission 2/2 pt's intubated status.   Pertinent  Medical History  DM2 Asthma Hyperlipidemia PTSD, anxiety Chronic pancreatitis  Significant Hospital Events: Including procedures, antibiotic start and stop dates in addition to other pertinent events   12/23: intubated 12/24: febrile, abx started for CAP 12/25 struggled with sedation - failed ketamine, precedex, dilaudid. Febrile and cultured 12/27 on Dilaudid and Versed drip 12/29 failed weaning this morning with tachypnea 12/31 was extubated 1230, failed extubation within an hour with bronchospasms, stridorous breathing. 01/01 started on empirical antibiotics with vancomycin and cefepime for HCAP 01/02 MRSA screen negative, vancomycin discontinued 01/06 extubation trial prior to trach, failed    Interim History / Subjective:  No events, much more comfortable on vent  Objective   Blood pressure (!) 104/55, pulse 75, temperature 98.7 F (37.1 C), temperature source Oral, resp. rate 20, height _0   (1.676 m), weight 77.6 kg, last menstrual period 03/07/2012, SpO2 95 %.    Vent Mode: PRVC FiO2 (%):  [40 %-100 %] 60 % Set Rate:  [22 bmp-30 bmp] 24 bmp Vt Set:  [410 mL] 410 mL PEEP:  [5 cmH20-8 cmH20] 5 cmH20 Plateau Pressure:  [21 cmH20] 21 cmH20   Intake/Output Summary (Last 24 hours) at 10/15/2021 0734 Last data filed at 10/15/2021 0700 Gross per 24 hour  Intake 794.64 ml  Output 800 ml  Net -5.36 ml    Filed Weights   10/11/21 0500 10/12/21 0400 10/14/21 0500  Weight: 78.5 kg 79.8 kg 77.6 kg    Examination: No distress Following commands Lungs with scattered rhonic Moves all 4 ext to command  Phos a bit low- repleting WBC up a bit CXR with ?RLL atelectasis, will check with bronch today  Resolved Hospital Problem list   Septic shock AKI Hypertensive crisis 2/2 cocaine use  Assessment & Plan:  Acute hypoxemic hypercarbic respiratory failure ICU delirium History of polysubstance abuse  PTSD Community-acquired pneumonia- completed course of ceffepime Acute pulmonary edema Leukocytosis Diastolic heart failure V4MG, uncontrolled (A1c 9.1)- better with current basal bolus regimen  - Push diuresis - Trach today after extensive discussions with husband - Bronch/BAL RLL - Continue clonidine, klonipin, dilaudid, seroquel tapers, hopefully trach will make this a bit easier  Best Practice (right click and "Reselect all SmartList Selections" daily)   Diet/type: tubefeeds DVT prophylaxis: LMWH GI prophylaxis: PPI Lines: N/A Foley:  N/A Code Status:  full code Last date of multidisciplinary goals of care discussion [daily with spouse]   Patient critically ill due to resp failure Interventions to address this today vent titration, family  discussions Risk of deterioration without these interventions is high  I personally spent 34 minutes providing critical care not including any separately billable procedures  Erskine Emery MD [facility]  Prefer epic messenger for cross cover needs If after hours, please call E-link

## 2021-10-15 NOTE — Evaluation (Signed)
SLP Cancellation Note  Patient Details Name: [name] MRN: [id number] DOB: 1966/08/14   Cancelled treatment:       Reason Eval/Treat Not Completed: Other (comment);Medical issues which prohibited therapy (Orders for BSE/PMSV received, pt currently on vent. Trach per notes.  Will continue efforts.  Thank you.)  Kathleen Lime, MS [facility] SLP [place] Office [phone] Cell [phone]  Macario Golds 10/15/2021, 12:06 PM

## 2021-10-15 NOTE — Progress Notes (Signed)
NAME:  [name], MRN:  [id number], DOB:  1966-01-28, LOS: 65 ADMISSION DATE:  09/30/2021, CONSULTATION DATE: 09/30/2021 REFERRING MD: ED provider, CHIEF COMPLAINT: Acute hypoxic respiratory failure  History of Present Illness:  56 yo black female with pmh asthma, dm2, hyperlipidemia, PTSD/anxiety and chronic pancreatitis presented via EMS on cpap machine with sats in the mid 60's. Pt is intubated and sedated so ROS and complete history are unobtainable at this time. All history is obtained from ED and chart review.    Per report pt was at home partaking in cocaine with her husband when she began having sudden and severe sob. She was altered, unable to hold herself up in bed, minimally responsive. She had minimal air movement while on cpap and decision was made to emergently intubate pt. CXR revealed pulmonary edema. BP was noted to be extremely elevated >097 systolic. She was started on nitro infusion.    CCM was consulted for admission 2/2 pt's intubated status.   Pertinent  Medical History  DM2 Asthma Hyperlipidemia PTSD, anxiety Chronic pancreatitis  Significant Hospital Events: Including procedures, antibiotic start and stop dates in addition to other pertinent events   12/23: intubated 12/24: febrile, abx started for CAP 12/25 struggled with sedation - failed ketamine, precedex, dilaudid. Febrile and cultured 12/27 on Dilaudid and Versed drip 12/29 failed weaning this morning with tachypnea 12/31 was extubated 1230, failed extubation within an hour with bronchospasms, stridorous breathing. 01/01 started on empirical antibiotics with vancomycin and cefepime for HCAP 01/02 MRSA screen negative, vancomycin discontinued 01/04 Hypotensive episode, started on Levophed 01/06 extubated to BiPAP, failed 01/07 tracheostomy  Interim History / Subjective:  Fever up to 101.6 overnight Awake and following commands this morning  Objective   Blood pressure (!) 94/55, pulse 71,  temperature 99.9 F (37.7 C), temperature source Oral, resp. rate (!) 24, height _0  (1.676 m), weight 71 kg, last menstrual period 03/07/2012, SpO2 99 %.    Vent Mode: PRVC FiO2 (%):  [30 %-40 %] 30 % Set Rate:  [24 bmp] 24 bmp Vt Set:  [410 mL] 410 mL PEEP:  [5 cmH20] 5 cmH20 Pressure Support:  [12 cmH20] 12 cmH20 Plateau Pressure:  [15 cmH20-20 cmH20] 20 cmH20   Intake/Output Summary (Last 24 hours) at 10/17/2021 0800 Last data filed at 10/17/2021 0700 Gross per 24 hour  Intake 1891.86 ml  Output 1025 ml  Net 866.86 ml   Filed Weights   10/14/21 0500 10/16/21 0500 10/17/21 0453  Weight: 77.6 kg 71.5 kg 71 kg    Examination: General: Acutely ill-appearing female.  Drowsy.  NAD HENT: Trach in place. Lungs: CTAB.  Scattered rhonchi. Cardiovascular: RRR.  No murmurs rubs or gallops Abdomen: Soft.  NT/ND.  Normal BS. Extremities: Warm. Well perfused. Neuro: Awake but drowsy.  Follows commands.  Weak grip strengths on the left.   Labs Reviewed: CBGs 168, 228, 212 K+ 3.6, sCr 0.93 WBC 18.5, Hgb 9.7  Resolved Hospital Problem list   Septic shock AKI Hypertensive crisis 2/2 cocaine use  Assessment & Plan:  Acute hypoxemic hypercarbic respiratory failure Improving.  Failed extubation x2.  Currently trached on minimal vent settings.  Weaning down sedation.  Still on 30% FiO2.  Will consider trach collar O2 needs improve. --Trach care, transition to trach collar today --BAL culture still with no growth x2-day --VAP bundle  Community-acquired pneumonia Acute pulmonary edema Leukocytosis Status post 5-day course of azithromycin and Rocephin for CAP followed by 5-day course of cefepime.  Still with leukocytosis and  intermittent fevers. --Repeat chest x-ray, UA, Ucx, bcx if pt fevers again --Continue duonebs  Hypotension Improved on minimal pressor. SBP in the 90s to 120s --Continue Levophed, wean off  --MAP >32  Diastolic heart failure Echo 12/24 showed EF 65 to 70%,  mild LVH and no valvular abnormalities.  UOP adequate.  -- Strict I&O's --Keep K+ >4, Mg>2  ICU delirium History of polysubstance abuse  PTSD Weaning off sedation.  Weaned off clonidine. --Continue to wean off Versed and Klonopin --Continue Seroquel and Dilaudid  T2DM, uncontrolled (A1c 9.1) Blood sugars relatively well controlled.  --Continue Novolog to 8 units q4hr --Increase Semglee to 10 units to daily --Discontinue Reglan --CBG goal 140-180   Best Practice (right click and "Reselect all SmartList Selections" daily)   Diet/type: tubefeeds DVT prophylaxis: LMWH GI prophylaxis: PPI Lines: N/A Foley:  N/A Code Status:  full code Last date of multidisciplinary goals of care discussion [1/9, updated spouse at bedside]  Labs   CBC: Recent Labs  Lab 10/13/21 0455 10/14/21 0330 10/15/21 0354 10/15/21 0742 10/16/21 0308 10/17/21 0328  WBC 19.0* 15.3* 18.0*  --  14.9* 18.5*  NEUTROABS 13.0*  --   --   --   --   --   HGB 9.4* 9.7* 9.7* 9.5* 10.1* 9.7*  HCT 30.3* 31.9* 31.2* 28.0* 32.1* 30.6*  MCV 96.5 97.3 95.4  --  94.1 95.3  PLT 408* 460* 443*  --  456* 408*    Basic Metabolic Panel: Recent Labs  Lab 10/13/21 0455 10/14/21 0330 10/15/21 0354 10/15/21 0742 10/16/21 0308 10/16/21 1253 10/17/21 0328  NA 138 139 140 143 141 144 143  K 3.4* 3.3* 3.8 3.5 2.8* 4.0 3.6  CL 106 102 108  --  106 108 108  CO2 _0 --  _1 GLUCOSE 145* 116* 161*  --  211* 191* 258*  BUN 29* 26* 20  --  26* 27* 26*  CREATININE 1.02* 1.02* 1.00  --  1.02* 0.99 0.93  CALCIUM 9.3 9.2 9.9  --  9.3 9.4 9.1  MG 2.2 2.1 2.4  --  2.1  --  2.2  PHOS  --  4.1 2.0*  --  4.3  --  3.2   GFR: Estimated Creatinine Clearance: 62.5 mL/min (by C-G formula based on SCr of 0.93 mg/dL). Recent Labs  Lab 10/14/21 0330 10/15/21 0354 10/16/21 0308 10/17/21 0328  WBC 15.3* 18.0* 14.9* 18.5*    Liver Function Tests: No results for input(s): AST, ALT, ALKPHOS, BILITOT, PROT, ALBUMIN in  the last 168 hours. No results for input(s): LIPASE, AMYLASE in the last 168 hours. No results for input(s): AMMONIA in the last 168 hours.  ABG    Component Value Date/Time   PHART 7.525 (H) 10/15/2021 0742   PCO2ART 31.1 (L) 10/15/2021 0742   PO2ART 55 (L) 10/15/2021 0742   HCO3 25.7 10/15/2021 0742   TCO2 27 10/15/2021 0742   ACIDBASEDEF 1.0 10/02/2021 0625   O2SAT 92.0 10/15/2021 0742     Critical care time: 30

## 2021-10-15 NOTE — Procedures (Signed)
Percutaneous Tracheostomy Procedure Note   [name]  [id number]  08-04-1966  Date:10/15/21  Time:12:22 PM   Provider Performing:Zinedine Ellner Sena Hitch with assistance by Dr. Ina Homes  Procedure: Percutaneous Tracheostomy with Bronchoscopic Guidance (31600)  Indication(s) Respiratory failure Facilitation of ventilator and sedation wean  Consent Risks of the procedure as well as the alternatives and risks of each were explained to the patient and/or caregiver.  Consent for the procedure was obtained.  Anesthesia Etomidate, Versed, Fentanyl, Vecuronium   Time Out Verified patient identification, verified procedure, site/side was marked, verified correct patient position, special equipment/implants available, medications/allergies/relevant history reviewed, required imaging and test results available.   Sterile Technique Maximal sterile technique including sterile barrier drape, hand hygiene, sterile gown, sterile gloves, mask, hair covering.    Procedure Description Appropriate anatomy identified by palpation.  Patient's neck prepped and draped in sterile fashion.  1% lidocaine with epinephrine was used to anesthetize skin overlying neck.  1.5cm incision made and blunt dissection performed until tracheal rings could be easily palpated.   Then a size 6 Shiley tracheostomy was placed under bronchoscopic visualization using usual Seldinger technique and serial dilation.   Bronchoscope confirmed placement above the carina.  Tracheostomy was sutured in place with adhesive pad to protect skin under pressure.    Patient connected to ventilator.   Complications/Tolerance None; patient tolerated the procedure well. Chest X-ray is ordered to confirm no post-procedural complication.   EBL N/A   Specimen(s) None

## 2021-10-16 DIAGNOSIS — J9601 Acute respiratory failure with hypoxia: Secondary | ICD-10-CM | POA: Diagnosis not present

## 2021-10-16 LAB — BASIC METABOLIC PANEL
Anion gap: 9 (ref 5–15)
Anion gap: 10 (ref 5–15)
BUN: 26 mg/dL — ABNORMAL HIGH (ref 6–20)
BUN: 27 mg/dL — ABNORMAL HIGH (ref 6–20)
CO2: 26 mmol/L (ref 22–32)
CO2: 26 mmol/L (ref 22–32)
Calcium: 9.3 mg/dL (ref 8.9–10.3)
Calcium: 9.4 mg/dL (ref 8.9–10.3)
Chloride: 106 mmol/L (ref 98–111)
Chloride: 108 mmol/L (ref 98–111)
Creatinine, Ser: 1.02 mg/dL — ABNORMAL HIGH (ref 0.44–1.00)
Creatinine, Ser: 0.99 mg/dL (ref 0.44–1.00)
GFR, Estimated: >60 mL/min (ref >60)
GFR, Estimated: >60 mL/min (ref >60)
Glucose, Bld: 211 mg/dL — ABNORMAL HIGH (ref 70–99)
Glucose, Bld: 191 mg/dL — ABNORMAL HIGH (ref 70–99)
Potassium: 2.8 mmol/L — ABNORMAL LOW (ref 3.5–5.1)
Potassium: 4 mmol/L (ref 3.5–5.1)
Sodium: 141 mmol/L (ref 135–145)
Sodium: 144 mmol/L (ref 135–145)

## 2021-10-16 LAB — CBC
HCT: 32.1 % — ABNORMAL LOW (ref 36.0–46.0)
Hemoglobin: 10.1 g/dL — ABNORMAL LOW (ref 12.0–15.0)
MCH: 29.6 pg (ref 26.0–34.0)
MCHC: 31.5 g/dL (ref 30.0–36.0)
MCV: 94.1 fL (ref 80.0–100.0)
Platelets: 456 10*3/uL — ABNORMAL HIGH (ref 150–400)
RBC: 3.41 MIL/uL — ABNORMAL LOW (ref 3.87–5.11)
RDW: 13.3 % (ref 11.5–15.5)
WBC: 14.9 10*3/uL — ABNORMAL HIGH (ref 4.0–10.5)
nRBC: 0 % (ref 0.0–0.2)

## 2021-10-16 LAB — GLUCOSE, CAPILLARY
Glucose-Capillary: 196 mg/dL — ABNORMAL HIGH (ref 70–99)
Glucose-Capillary: 225 mg/dL — ABNORMAL HIGH (ref 70–99)
Glucose-Capillary: 122 mg/dL — ABNORMAL HIGH (ref 70–99)
Glucose-Capillary: 193 mg/dL — ABNORMAL HIGH (ref 70–99)
Glucose-Capillary: 199 mg/dL — ABNORMAL HIGH (ref 70–99)
Glucose-Capillary: 212 mg/dL — ABNORMAL HIGH (ref 70–99)

## 2021-10-16 LAB — PHOSPHORUS: Phosphorus: 4.3 mg/dL (ref 2.5–4.6)

## 2021-10-16 LAB — MAGNESIUM: Magnesium: 2.1 mg/dL (ref 1.7–2.4)

## 2021-10-16 MED ORDER — POTASSIUM CHLORIDE 10 MEQ/50ML IV SOLN
10.0000 meq | INTRAVENOUS | Status: AC
  Administered 2021-10-16 (×4): 10 meq via INTRAVENOUS
  Filled 2021-10-16 (×4): qty 50

## 2021-10-16 MED ORDER — POTASSIUM CHLORIDE 20 MEQ PO PACK
40.0000 meq | PACK | Freq: Once | ORAL | Status: AC
  Administered 2021-10-16: 40 meq
  Filled 2021-10-16: qty 2

## 2021-10-16 NOTE — Progress Notes (Signed)
[facility] Progress Note Patient Name: [name] DOB: [birth year] MRN: [id number]   Date of Service  10/16/2021  HPI/Events of Note  Restraints renewal request. On vent, s/p bronch.   Evaluation done  eICU Interventions  Bilateral, non violent soft wrist restriants ordered to prevent self injury and harm from pullineg lines/tubes     Intervention Category Minor Interventions: Agitation / anxiety - evaluation and management  Elmer Sow 10/16/2021, 10:38 PM

## 2021-10-16 NOTE — Progress Notes (Signed)
NAME:  [name], MRN:  [id number], DOB:  03-Jan-1966, LOS: 39 ADMISSION DATE:  09/30/2021, CONSULTATION DATE: 09/30/2021 REFERRING MD: ED provider, CHIEF COMPLAINT: Acute hypoxic respiratory failure  History of Present Illness:  56 yo black female with pmh asthma, dm2, hyperlipidemia, PTSD/anxiety and chronic pancreatitis presented via EMS on cpap machine with sats in the mid 60's. Pt is intubated and sedated so ROS and complete history are unobtainable at this time. All history is obtained from ED and chart review.    Per report pt was at home partaking in cocaine with her husband when she began having sudden and severe sob. She was altered, unable to hold herself up in bed, minimally responsive. She had minimal air movement while on cpap and decision was made to emergently intubate pt. CXR revealed pulmonary edema. BP was noted to be extremely elevated >585 systolic. She was started on nitro infusion.    CCM was consulted for admission 2/2 pt's intubated status.   Pertinent  Medical History  DM2 Asthma Hyperlipidemia PTSD, anxiety Chronic pancreatitis  Significant Hospital Events: Including procedures, antibiotic start and stop dates in addition to other pertinent events   12/23: intubated 12/24: febrile, abx started for CAP 12/25 struggled with sedation - failed ketamine, precedex, dilaudid. Febrile and cultured 12/27 on Dilaudid and Versed drip 12/29 failed weaning this morning with tachypnea 12/31 was extubated 1230, failed extubation within an hour with bronchospasms, stridorous breathing. 01/01 started on empirical antibiotics with vancomycin and cefepime for HCAP 01/02 MRSA screen negative, vancomycin discontinued 01/06 extubation trial prior to trach, failed 01/07 trach  Interim History / Subjective:  No events. On PS.  Denies pain, coming down on sedation.  Objective   Blood pressure (!) 98/54, pulse 79, temperature 99.9 F (37.7 C), temperature source Axillary,  resp. rate 11, height _0  (1.676 m), weight 71.5 kg, last menstrual period 03/07/2012, SpO2 96 %.    Vent Mode: PSV;CPAP FiO2 (%):  [40 %-60 %] 40 % Set Rate:  [24 bmp] 24 bmp Vt Set:  [410 mL] 410 mL PEEP:  [5 cmH20] 5 cmH20 Pressure Support:  [12 cmH20] 12 cmH20 Plateau Pressure:  [15 cmH20-20 cmH20] 15 cmH20   Intake/Output Summary (Last 24 hours) at 10/16/2021 1007 Last data filed at 10/16/2021 1000 Gross per 24 hour  Intake 1277.57 ml  Output 2005 ml  Net -727.43 ml    Filed Weights   10/12/21 0400 10/14/21 0500 10/16/21 0500  Weight: 79.8 kg 77.6 kg 71.5 kg    Examination: No distress Trach site CDI Lungs with scattered rhonchi improved Moves all 4 ext to command  Phos a bit low- repleting WBC up a bit CXR with ?RLL atelectasis, will check with bronch today  Resolved Hospital Problem list   Septic shock AKI Hypertensive crisis 2/2 cocaine use  Assessment & Plan:  Acute hypoxemic hypercarbic respiratory failure ICU delirium History of polysubstance abuse  PTSD Community-acquired pneumonia- completed course of ceffepime Acute pulmonary edema- improved Leukocytosis- improved Diastolic heart failure- improved with diuresis T2DM, uncontrolled (A1c 9.1)- better with current basal bolus regimen  - Replete K, check BMP around midday will consider repeat diuretic dose - See if we can get to TC today, VAP prevention bundle in place - Bronch/BAL RLL: neg to date - Continue clonidine, klonipin, dilaudid, seroquel, wean gtt - Husband updated at bedside - May benefit from [place] (right click and "Reselect all SmartList Selections" daily)   Diet/type: tubefeeds DVT prophylaxis: LMWH GI prophylaxis: PPI  Lines: N/A Foley:  N/A Code Status:  full code Last date of multidisciplinary goals of care discussion [daily with spouse]   Patient critically ill due to resp failure Interventions to address this today vent titration, family discussions Risk of  deterioration without these interventions is high  I personally spent 32 minutes providing critical care not including any separately billable procedures  Erskine Emery MD [facility]  Prefer epic messenger for cross cover needs If after hours, please call E-link

## 2021-10-16 NOTE — Progress Notes (Signed)
[facility] ADULT ICU REPLACEMENT PROTOCOL   The patient does apply for the [facility] Adult ICU Electrolyte Replacment Protocol based on the criteria listed below:   1.Exclusion criteria: TCTS patients, ECMO patients, and Dialysis patients 2. Is GFR >/= 30 ml/min? Yes.    Patient's GFR today is >60 3. Is SCr </= 2? Yes.   Patient's SCr is 1.02 mg/dL 4. Did SCr increase >/= 0.5 in 24 hours? No. 5.Pt's weight >40kg  Yes.   6. Abnormal electrolyte(s): K  7. Electrolytes replaced per protocol 8.  Call MD STAT for K+ </= 2.5, Phos </= 1, or Mag </= 1 Physician:  Jeannene Patella [facility] 10/16/2021 4:16 AM

## 2021-10-17 DIAGNOSIS — J9601 Acute respiratory failure with hypoxia: Secondary | ICD-10-CM | POA: Diagnosis not present

## 2021-10-17 LAB — GLUCOSE, CAPILLARY
Glucose-Capillary: 160 mg/dL — ABNORMAL HIGH (ref 70–99)
Glucose-Capillary: 175 mg/dL — ABNORMAL HIGH (ref 70–99)
Glucose-Capillary: 197 mg/dL — ABNORMAL HIGH (ref 70–99)
Glucose-Capillary: 218 mg/dL — ABNORMAL HIGH (ref 70–99)
Glucose-Capillary: 225 mg/dL — ABNORMAL HIGH (ref 70–99)
Glucose-Capillary: 228 mg/dL — ABNORMAL HIGH (ref 70–99)

## 2021-10-17 LAB — MAGNESIUM: Magnesium: 2.2 mg/dL (ref 1.7–2.4)

## 2021-10-17 LAB — CBC
HCT: 30.6 % — ABNORMAL LOW (ref 36.0–46.0)
Hemoglobin: 9.7 g/dL — ABNORMAL LOW (ref 12.0–15.0)
MCH: 30.2 pg (ref 26.0–34.0)
MCHC: 31.7 g/dL (ref 30.0–36.0)
MCV: 95.3 fL (ref 80.0–100.0)
Platelets: 408 10*3/uL — ABNORMAL HIGH (ref 150–400)
RBC: 3.21 MIL/uL — ABNORMAL LOW (ref 3.87–5.11)
RDW: 13.2 % (ref 11.5–15.5)
WBC: 18.5 10*3/uL — ABNORMAL HIGH (ref 4.0–10.5)
nRBC: 0 % (ref 0.0–0.2)

## 2021-10-17 LAB — BASIC METABOLIC PANEL
Anion gap: 8 (ref 5–15)
BUN: 26 mg/dL — ABNORMAL HIGH (ref 6–20)
CO2: 27 mmol/L (ref 22–32)
Calcium: 9.1 mg/dL (ref 8.9–10.3)
Chloride: 108 mmol/L (ref 98–111)
Creatinine, Ser: 0.93 mg/dL (ref 0.44–1.00)
GFR, Estimated: >60 mL/min (ref >60)
Glucose, Bld: 258 mg/dL — ABNORMAL HIGH (ref 70–99)
Potassium: 3.6 mmol/L (ref 3.5–5.1)
Sodium: 143 mmol/L (ref 135–145)

## 2021-10-17 LAB — CULTURE, RESPIRATORY W GRAM STAIN: Culture: NORMAL

## 2021-10-17 LAB — PHOSPHORUS: Phosphorus: 3.2 mg/dL (ref 2.5–4.6)

## 2021-10-17 MED ORDER — CLONAZEPAM 1 MG PO TABS
1.0000 mg | ORAL_TABLET | Freq: Three times a day (TID) | ORAL | Status: DC
  Administered 2021-10-17 – 2021-10-19 (×6): 1 mg
  Filled 2021-10-17 (×6): qty 1

## 2021-10-17 MED ORDER — INSULIN GLARGINE-YFGN 100 UNIT/ML ~~LOC~~ SOLN
10.0000 [IU] | Freq: Every day | SUBCUTANEOUS | Status: DC
  Filled 2021-10-17: qty 0.1

## 2021-10-17 MED ORDER — OXYCODONE HCL 5 MG PO TABS
5.0000 mg | ORAL_TABLET | Freq: Four times a day (QID) | ORAL | Status: DC
  Administered 2021-10-17 – 2021-10-19 (×8): 5 mg
  Filled 2021-10-17 (×8): qty 1

## 2021-10-17 NOTE — Evaluation (Signed)
Physical Therapy Evaluation Patient Details Name: [name] MRN: [id number] DOB: 04-01-1966 Today's Date: 10/17/2021  History of Present Illness  Pt is a 56 y.o. female admitted 09/30/21 after becoming minimally responsive after doing cocaine with husband, required emergent intubation 12/23. ETT 12/23-12/31, reintubated 12/31-1/6, failed again; s/p trach 1/7. PMH includes asthma, DM2, HLD, PTSD, anxiety, chronic pancreatitis.  Clinical Impression  Pt admitted with/for problem as stated above.  Following a prolonged state of immobility, pt mobilizes at a max to total level of 1 to 2 persons.  Pt currently limited functionally due to the problems listed. ( See problems list.)   Pt will benefit from PT to maximize function and safety in order to get ready for next venue listed below.        Recommendations for follow up therapy are one component of a multi-disciplinary discharge planning process, led by the attending physician.  Recommendations may be updated based on patient status, additional functional criteria and insurance authorization.  Follow Up Recommendations Acute inpatient rehab (3hours/day)    Assistance Recommended at Discharge Frequent or constant Supervision/Assistance  Patient can return home with the following  A lot of help with walking and/or transfers;A lot of help with bathing/dressing/bathroom;Two people to help with walking and/or transfers    Equipment Recommendations None recommended by PT  Recommendations for Other Services  Rehab consult    Functional Status Assessment Patient has had a recent decline in their functional status and demonstrates the ability to make significant improvements in function in a reasonable and predictable amount of time.     Precautions / Restrictions Precautions Precautions: Fall Precaution Comments: Vent via trach, foley cath, rectal tube, NGT, bilateral wrist restraints and mittens.      Mobility  Bed Mobility Overal bed  mobility: Needs Assistance Bed Mobility: Supine to Sit;Sit to Supine     Supine to sit: Max assist Sit to supine: Max assist;+2 for safety/equipment   General bed mobility comments: Patient able to initiate advancement of BLE toward EOB with external assist and increased time/effort. Max A and HOB elevated to bring trunk upright. Max A at trunk and BLE to return to supine.  Like more assist due to standing trials +2 assist for supine scoot toward [facility] with use of chuck pad.    Transfers Overall transfer level: Needs assistance   Transfers: Sit to/from Stand Sit to Stand: Max assist;+2 safety/equipment;Total assist           General transfer comment: face to face assist to stand x2 at EOB for placement of the bed pad    Ambulation/Gait               General Gait Details: not able  Stairs            Wheelchair Mobility    Modified Rankin (Stroke Patients Only)       Balance Overall balance assessment: Needs assistance Sitting-balance support: Bilateral upper extremity supported;Feet supported Sitting balance-Leahy Scale: Poor Sitting balance - Comments: Mod A and BUE support on bed surface required to maintain static sitting at EOB. Poor head control. Postural control: Right lateral lean;Other (comment) (Anterior) Standing balance support: During functional activity Standing balance-Leahy Scale: Zero                               Pertinent Vitals/Pain Pain Assessment: Faces Faces Pain Scale: No hurt Facial Expression: Relaxed, neutral Body Movements: Absence of movements Muscle Tension: Relaxed  Compliance with ventilator (intubated pts.): Tolerating ventilator or movement Vocalization (extubated pts.): N/A CPOT Total: 0 Pain Intervention(s): Monitored during session    Home Living Family/patient expects to be discharged to:: Private residence Living Arrangements: Spouse/significant other Available Help at Discharge: Family;Available 24  hours/day Type of Home: House Home Access: Stairs to enter Entrance Stairs-Rails: Right Entrance Stairs-Number of Steps: 2   Home Layout: One level Home Equipment: Conservation officer, nature (2 wheels);BSC/3in1;Tub bench      Prior Function Prior Level of Function : Independent/Modified Independent               ADLs Comments: I with ADLs/IADLs without AD. Does not drive.     Hand Dominance   Dominant Hand: Right    Extremity/Trunk Assessment   Upper Extremity Assessment Upper Extremity Assessment: Defer to OT evaluation    Lower Extremity Assessment Lower Extremity Assessment: LLE deficits/detail;RLE deficits/detail RLE Deficits / Details: generalized weakness, slow to respond to command, fine motor coordination impaired. RLE Coordination: decreased fine motor LLE Deficits / Details: generalized weakness, slow to respond to command, fine motor coordination impaired. LLE Coordination: decreased fine motor    Cervical / Trunk Assessment Cervical / Trunk Assessment: Normal  Communication   Communication: Tracheostomy  Cognition Arousal/Alertness: Awake/alert Behavior During Therapy: Flat affect Overall Cognitive Status: Impaired/Different from baseline Area of Impairment: Orientation;Attention;Memory;Following commands;Awareness;Problem solving                 Orientation Level: Disoriented to;Person;Place;Time;Situation Current Attention Level: Focused Memory: Decreased short-term memory Following Commands: Follows one step commands with increased time;Follows one step commands inconsistently   Awareness: Intellectual Problem Solving: Slow processing;Decreased initiation;Difficulty sequencing;Requires verbal cues;Requires tactile cues General Comments: pt needed repeated cuing  for 1-step command following and took extra time.        General Comments General comments (skin integrity, edema, etc.): Heavy cough and secretions.  Frequent dislodging of her trach.   VSS on Vent    Exercises     Assessment/Plan    PT Assessment Patient needs continued PT services  PT Problem List Decreased strength;Decreased activity tolerance;Decreased balance;Decreased mobility;Decreased coordination;Decreased cognition;Decreased knowledge of use of DME;Decreased safety awareness;Cardiopulmonary status limiting activity       PT Treatment Interventions DME instruction;Gait training;Functional mobility training;Therapeutic activities;Therapeutic exercise;Balance training;Neuromuscular re-education;Patient/family education    PT Goals (Current goals can be found in the Care Plan section)  Acute Rehab PT Goals Patient Stated Goal: pt unable PT Goal Formulation: Patient unable to participate in goal setting Time For Goal Achievement: 10/31/21 Potential to Achieve Goals: Good    Frequency Min 3X/week     Co-evaluation               AM-PAC PT "6 Clicks" Mobility  Outcome Measure Help needed turning from your back to your side while in a flat bed without using bedrails?: A Lot Help needed moving from lying on your back to sitting on the side of a flat bed without using bedrails?: A Lot Help needed moving to and from a bed to a chair (including a wheelchair)?: Total Help needed standing up from a chair using your arms (e.g., wheelchair or bedside chair)?: A Lot Help needed to walk in hospital room?: Total Help needed climbing 3-5 steps with a railing? : Total 6 Click Score: 9    End of Session   Activity Tolerance: Patient tolerated treatment well;Patient limited by fatigue Patient left: in bed;with call bell/phone within reach;with bed alarm set;with nursing/sitter in room Nurse Communication: Mobility  status PT Visit Diagnosis: Other abnormalities of gait and mobility (R26.89);Muscle weakness (generalized) (M62.81)    Time: 3709-6438 PT Time Calculation (min) (ACUTE ONLY): 16 min   Charges:   PT Evaluation $PT Eval Moderate Complexity: 1 Mod           10/17/2021  Ginger Carne., PT Acute Rehabilitation Services [phone]  (pager) [phone]  (office)  Tessie Fass Jameila Keeny 10/17/2021, 8:29 PM

## 2021-10-17 NOTE — Progress Notes (Signed)
SLP Cancellation Note  Patient Details Name: [name] MRN: [id number] DOB: 04/16/1966   Cancelled treatment:        Attempted to see pt for swallowing evaluation and trial of PMSV.  Pt requiring mechanical ventilation at this time, but with plans for TCT later today or tomorrow.  SLP to follow for assessment when pt is on trach collar.    Celedonio Savage, MA, [place] Office: [phone]  10/17/2021, 11:26 AM

## 2021-10-17 NOTE — Evaluation (Signed)
Occupational Therapy Evaluation Patient Details Name: [name] MRN: [id number] DOB: 1966-03-22 Today's Date: 10/17/2021   History of Present Illness Pt is a 56 y.o. female admitted 09/30/21 after becoming minimally responsive after doing cocaine with husband, required emergent intubation 12/23. ETT 12/23-12/31, reintubated 12/31-1/6, failed again; s/p trach 1/7. PMH includes asthma, DM2, HLD, PTSD, anxiety, chronic pancreatitis.   Clinical Impression   PTA patient was living with her spouse in a private residence and was grossly I with ADLs/IADLs without AD. Patient currently functioning below baseline requiring Max A to Total A grossly for bed mobility and observed ADLs. Patient also limited by deficits listed below including decreased cardiopulmonary status requiring vent via trach with FiO2 30%, decreased static sitting balance and decreased activity tolerance and would benefit from continued acute OT services in prep for safe d/c to next level of care. Per spouse, patient is very motivated. Recommendation for intensive acute inpatient rehab. OT will continue to follow acutely to maximize safety/independence with self-care tasks.      Recommendations for follow up therapy are one component of a multi-disciplinary discharge planning process, led by the attending physician.  Recommendations may be updated based on patient status, additional functional criteria and insurance authorization.   Follow Up Recommendations  Acute inpatient rehab (3hours/day)    Assistance Recommended at Discharge Frequent or constant Supervision/Assistance  Patient can return home with the following      Functional Status Assessment  Patient has had a recent decline in their functional status and demonstrates the ability to make significant improvements in function in a reasonable and predictable amount of time.  Equipment Recommendations  Other (comment) (Defer to next level of care.)    Recommendations for  Other Services       Precautions / Restrictions Precautions Precautions: Fall Precaution Comments: Vent via trach, foley cath, rectal tube, NGT, bilateral wrist restraints and mittens. Restrictions Weight Bearing Restrictions: No      Mobility Bed Mobility Overal bed mobility: Needs Assistance Bed Mobility: Supine to Sit;Sit to Supine     Supine to sit: Max assist Sit to supine: Total assist   General bed mobility comments: Patient able to initiate advancement of BLE toward EOB with external assist and increased time/effort. Max A and HOB elevated to bring trunk upright. Total A at trunk and BLE to return to supine. +2 assist for supine scoot toward [facility] with use of chuck pad.    Transfers Overall transfer level: Needs assistance                 General transfer comment: Deferred for patient/therapist safety. Requires +2 assist..      Balance Overall balance assessment: Needs assistance Sitting-balance support: Bilateral upper extremity supported;Feet supported Sitting balance-Leahy Scale: Poor Sitting balance - Comments: Mod A and BUE support on bed surface required to maintain static sitting at EOB. Poor head control. Postural control: Right lateral lean;Other (comment) (Anterior)                                 ADL either performed or assessed with clinical judgement   ADL Overall ADL's : Needs assistance/impaired Eating/Feeding: NPO (NGT)   Grooming: Total assistance Grooming Details (indicate cue type and reason): Total A to wash face seate EOB. Upper Body Bathing: Total assistance   Lower Body Bathing: Total assistance;Bed level   Upper Body Dressing : Total assistance   Lower Body Dressing: Total assistance;Bed level  General ADL Comments: Grossly dependent     Vision Baseline Vision/History: 1 Wears glasses (Reading) Ability to See in Adequate Light: 0 Adequate Patient Visual Report: Other (comment) (Unable to  assess 2/2 trach.) Vision Assessment?: No apparent visual deficits Additional Comments: Patient trachs therapist/spouse around room with cues to do so.     Perception     Praxis      Pertinent Vitals/Pain Pain Assessment: Faces Faces Pain Scale: No hurt Facial Expression: Relaxed, neutral Body Movements: Absence of movements Muscle Tension: Relaxed Compliance with ventilator (intubated pts.): Tolerating ventilator or movement Vocalization (extubated pts.): N/A CPOT Total: 0 Pain Intervention(s): Monitored during session     Hand Dominance Right   Extremity/Trunk Assessment Upper Extremity Assessment Upper Extremity Assessment: RUE deficits/detail;LUE deficits/detail RUE Deficits / Details: Unable to maintain position of RUE in space against gravity. Able to make a loose fist. RUE Coordination: decreased gross motor;decreased fine motor LUE Deficits / Details: Unable to maintain position of RUE in space against gravity. Able to make a loose fist. LUE Coordination: decreased fine motor;decreased gross motor   Lower Extremity Assessment Lower Extremity Assessment: Defer to PT evaluation (Able to lift BLE off bed surface while OT assisted with donning footwear.)   Cervical / Trunk Assessment Cervical / Trunk Assessment: Normal   Communication Communication Communication: Tracheostomy   Cognition Arousal/Alertness: Awake/alert Behavior During Therapy: Flat affect Overall Cognitive Status: Impaired/Different from baseline Area of Impairment: Orientation;Attention;Memory;Following commands;Awareness;Problem solving                 Orientation Level: Disoriented to;Person;Place;Time;Situation Current Attention Level: Focused Memory: Decreased short-term memory Following Commands: Follows one step commands with increased time;Follows one step commands inconsistently   Awareness: Intellectual Problem Solving: Slow processing;Decreased initiation;Difficulty  sequencing;Requires verbal cues;Requires tactile cues General Comments: Patient able to mouth first name with increased time. Unable to mouth last name or DOB. Follows 1-step verbal commands with 50% accuracy, more than increased time and repeat cues.     General Comments  Husband present at bedside throughout assessment.    Exercises     Shoulder Instructions      Home Living Family/patient expects to be discharged to:: Private residence Living Arrangements: Spouse/significant other Available Help at Discharge: Family;Available 24 hours/day Type of Home: House Home Access: Stairs to enter [employer] of Steps: 2 Entrance Stairs-Rails: Right Home Layout: One level     Bathroom Shower/Tub: Teacher, early years/pre: Standard     Home Equipment: Conservation officer, nature (2 wheels);BSC/3in1;Tub bench          Prior Functioning/Environment Prior Level of Function : Independent/Modified Independent               ADLs Comments: I with ADLs/IADLs without AD. Does not drive.        OT Problem List: Decreased strength;Decreased activity tolerance;Impaired balance (sitting and/or standing);Decreased coordination;Decreased cognition;Decreased safety awareness;Decreased knowledge of use of DME or AE;Cardiopulmonary status limiting activity;Impaired UE functional use      OT Treatment/Interventions: Self-care/ADL training;Therapeutic exercise;Energy conservation;DME and/or AE instruction;Therapeutic activities;Cognitive remediation/compensation;Patient/family education;Balance training    OT Goals(Current goals can be found in the care plan section) Acute Rehab OT Goals Patient Stated Goal: Per husband, for patient to get stronger. OT Goal Formulation: With family Time For Goal Achievement: 10/31/21 Potential to Achieve Goals: Good ADL Goals Pt Will Perform Eating: with min assist;sitting Pt Will Perform Grooming: with min assist;sitting Pt Will Perform Upper  Body Dressing: with min assist;sitting Pt Will Perform Lower Body  Dressing: with min assist;sit to/from stand Pt Will Transfer to Toilet: with min assist;bedside commode;stand pivot transfer Pt Will Perform Toileting - Clothing Manipulation and hygiene: with min assist;sit to/from stand Additional ADL Goal #1: Patient will follow 1-step verbal commands with 75% accuracy in prep for ADLs. Additional ADL Goal #2: Patient will maintain static sitting balance for 5-8 min at EOB with close supervision A in prep for ADLs.  OT Frequency: Min 2X/week    Co-evaluation              AM-PAC OT "6 Clicks" Daily Activity     Outcome Measure Help from another person eating meals?: Total Help from another person taking care of personal grooming?: Total Help from another person toileting, which includes using toliet, bedpan, or urinal?: Total Help from another person bathing (including washing, rinsing, drying)?: Total Help from another person to put on and taking off regular upper body clothing?: Total Help from another person to put on and taking off regular lower body clothing?: Total 6 Click Score: 6   End of Session Equipment Utilized During Treatment: Oxygen (Trach via vent FiO2 30%) Nurse Communication: Mobility status  Activity Tolerance: Patient tolerated treatment well;Patient limited by fatigue Patient left: in bed;with call bell/phone within reach;with bed alarm set  OT Visit Diagnosis: Unsteadiness on feet (R26.81);Muscle weakness (generalized) (M62.81);Other symptoms and signs involving cognitive function                Time: 0720-0754 OT Time Calculation (min): 34 min Charges:  OT General Charges $OT Visit: 1 Visit OT Evaluation $OT Eval High Complexity: 1 High OT Treatments $Therapeutic Activity: 8-22 mins  Shaneese Tait H. OTR/L Supplemental OT, Department of rehab services [phone]  Lanette Ell R H. 10/17/2021, 8:26 AM

## 2021-10-17 NOTE — Progress Notes (Signed)
Inpatient Diabetes Program Recommendations  AACE/ADA: New Consensus Statement on Inpatient Glycemic Control (2015)  Target Ranges:  Prepandial:   less than 140 mg/dL      Peak postprandial:   less than 180 mg/dL (1-2 hours)      Critically ill patients:  140 - 180 mg/dL   Lab Results  Component Value Date   GLUCAP 160 (H) 10/17/2021   HGBA1C 9.1 (H) 10/01/2021    Review of Glycemic Control  Latest Reference Range & Units 10/16/21 15:08 10/16/21 19:12 10/16/21 23:13 10/17/21 03:09 10/17/21 07:13  Glucose-Capillary 70 - 99 mg/dL 193 (H) 199 (H) 212 (H) 228 (H) 160 (H)   Diabetes history: DM 2 Outpatient Diabetes medications:  Lantus 45 units q HS, Janumet 50-1000 mg bid Current orders for Inpatient glycemic control:  Novolog 0-20 units q 4 hours VItal 1.2 cal at 65 cc/hr Novolog 8 units q 4 hours Semglee 5 units daily Inpatient Diabetes Program Recommendations:    Consider increasing Semglee to 10 units bid.    Thanks,  Adah Perl, RN, BC-ADM Inpatient Diabetes Coordinator Pager [phone]  (8a-5p)

## 2021-10-18 ENCOUNTER — Inpatient Hospital Stay (HOSPITAL_COMMUNITY): Payer: Medicare HMO

## 2021-10-18 DIAGNOSIS — G9340 Encephalopathy, unspecified: Secondary | ICD-10-CM

## 2021-10-18 DIAGNOSIS — J9601 Acute respiratory failure with hypoxia: Secondary | ICD-10-CM | POA: Diagnosis not present

## 2021-10-18 DIAGNOSIS — R6520 Severe sepsis without septic shock: Secondary | ICD-10-CM | POA: Diagnosis not present

## 2021-10-18 DIAGNOSIS — A419 Sepsis, unspecified organism: Secondary | ICD-10-CM | POA: Diagnosis not present

## 2021-10-18 LAB — BASIC METABOLIC PANEL
Anion gap: 7 (ref 5–15)
BUN: 23 mg/dL — ABNORMAL HIGH (ref 6–20)
CO2: 28 mmol/L (ref 22–32)
Calcium: 9.2 mg/dL (ref 8.9–10.3)
Chloride: 110 mmol/L (ref 98–111)
Creatinine, Ser: 0.92 mg/dL (ref 0.44–1.00)
GFR, Estimated: >60 mL/min (ref >60)
Glucose, Bld: 239 mg/dL — ABNORMAL HIGH (ref 70–99)
Potassium: 3.6 mmol/L (ref 3.5–5.1)
Sodium: 145 mmol/L (ref 135–145)

## 2021-10-18 LAB — URINALYSIS, MICROSCOPIC (REFLEX)

## 2021-10-18 LAB — PROCALCITONIN: Procalcitonin: 0.16 ng/mL

## 2021-10-18 LAB — GLUCOSE, CAPILLARY
Glucose-Capillary: 145 mg/dL — ABNORMAL HIGH (ref 70–99)
Glucose-Capillary: 182 mg/dL — ABNORMAL HIGH (ref 70–99)
Glucose-Capillary: 216 mg/dL — ABNORMAL HIGH (ref 70–99)
Glucose-Capillary: 216 mg/dL — ABNORMAL HIGH (ref 70–99)
Glucose-Capillary: 249 mg/dL — ABNORMAL HIGH (ref 70–99)
Glucose-Capillary: 274 mg/dL — ABNORMAL HIGH (ref 70–99)

## 2021-10-18 LAB — CBC
HCT: 28.7 % — ABNORMAL LOW (ref 36.0–46.0)
Hemoglobin: 8.8 g/dL — ABNORMAL LOW (ref 12.0–15.0)
MCH: 29.2 pg (ref 26.0–34.0)
MCHC: 30.7 g/dL (ref 30.0–36.0)
MCV: 95.3 fL (ref 80.0–100.0)
Platelets: 366 10*3/uL (ref 150–400)
RBC: 3.01 MIL/uL — ABNORMAL LOW (ref 3.87–5.11)
RDW: 13.6 % (ref 11.5–15.5)
WBC: 19.5 10*3/uL — ABNORMAL HIGH (ref 4.0–10.5)
nRBC: 0 % (ref 0.0–0.2)

## 2021-10-18 LAB — URINALYSIS, ROUTINE W REFLEX MICROSCOPIC
Bilirubin Urine: NEGATIVE
Glucose, UA: >=500 mg/dL — AB
Ketones, ur: NEGATIVE mg/dL
Leukocytes,Ua: NEGATIVE
Nitrite: NEGATIVE
Protein, ur: 100 mg/dL — AB
Specific Gravity, Urine: 1.015 (ref 1.005–1.030)
pH: 8 (ref 5.0–8.0)

## 2021-10-18 LAB — PHOSPHORUS: Phosphorus: 2.4 mg/dL — ABNORMAL LOW (ref 2.5–4.6)

## 2021-10-18 LAB — MAGNESIUM: Magnesium: 2.1 mg/dL (ref 1.7–2.4)

## 2021-10-18 MED ORDER — DEXMEDETOMIDINE HCL IN NACL 400 MCG/100ML IV SOLN
0.4000 ug/kg/h | INTRAVENOUS | Status: DC
  Administered 2021-10-18 (×2): 0.6 ug/kg/h via INTRAVENOUS
  Administered 2021-10-19: 1 ug/kg/h via INTRAVENOUS
  Administered 2021-10-19: 0.6 ug/kg/h via INTRAVENOUS
  Administered 2021-10-20: 0.5 ug/kg/h via INTRAVENOUS
  Administered 2021-10-20: 0.8 ug/kg/h via INTRAVENOUS
  Administered 2021-10-20 – 2021-10-21 (×2): 0.6 ug/kg/h via INTRAVENOUS
  Administered 2021-10-21: 0.8 ug/kg/h via INTRAVENOUS
  Administered 2021-10-22 (×2): 1.2 ug/kg/h via INTRAVENOUS
  Filled 2021-10-18 (×11): qty 100

## 2021-10-18 MED ORDER — POLYETHYLENE GLYCOL 3350 17 G PO PACK: 17.0000 g | PACK | Freq: Every day | ORAL | Status: DC | PRN

## 2021-10-18 MED ORDER — INSULIN GLARGINE-YFGN 100 UNIT/ML ~~LOC~~ SOLN: 15.0000 [IU] | Freq: Every day | SUBCUTANEOUS | Status: DC

## 2021-10-18 MED ORDER — QUETIAPINE FUMARATE 100 MG PO TABS
100.0000 mg | ORAL_TABLET | Freq: Two times a day (BID) | ORAL | Status: DC
  Administered 2021-10-18: 100 mg
  Filled 2021-10-18: qty 1

## 2021-10-18 MED ORDER — POTASSIUM CHLORIDE 20 MEQ PO PACK
40.0000 meq | PACK | Freq: Once | ORAL | Status: AC
  Administered 2021-10-18: 40 meq
  Filled 2021-10-18: qty 2

## 2021-10-18 MED ORDER — INSULIN GLARGINE-YFGN 100 UNIT/ML ~~LOC~~ SOLN
10.0000 [IU] | Freq: Every day | SUBCUTANEOUS | Status: DC
  Administered 2021-10-18: 10 [IU] via SUBCUTANEOUS
  Filled 2021-10-18 (×2): qty 0.1

## 2021-10-18 MED ORDER — DOCUSATE SODIUM 50 MG/5ML PO LIQD
100.0000 mg | Freq: Every day | ORAL | Status: DC | PRN
  Filled 2021-10-18: qty 10

## 2021-10-18 MED ORDER — QUETIAPINE FUMARATE 100 MG PO TABS
200.0000 mg | ORAL_TABLET | Freq: Two times a day (BID) | ORAL | Status: DC
  Administered 2021-10-18 – 2021-10-19 (×2): 200 mg
  Filled 2021-10-18 (×2): qty 2

## 2021-10-18 NOTE — Progress Notes (Signed)
Patients husband demanding restraints are taken off patient. Education provided on restraints, patient pulling at tubes and lines. Husband took them off patient himself. Resident at bedside. Per MD/resident remove restraints. Patients husband agreeable to mitts when he isn't in the room. Precedex started for agitation.

## 2021-10-18 NOTE — Progress Notes (Addendum)
[place] Progress Note Patient Name: [name] DOB: 10-Sep-1966 MRN: [id number]   Date of Service  10/18/2021  HPI/Events of Note  Notified patient having episode of emesis and transient desaturation  eICU Interventions  CXR ordered to assess feeding tube placement as well Elink to be informed once CXR is done  Follow-up Feeding tube in correct position. No new infiltrates on CXR        [name] [name] 10/18/2021, 10:53 PM

## 2021-10-18 NOTE — TOC Progression Note (Signed)
Transition of Care [facility]) - Progression Note    Patient Details  Name: [name] MRN: [id number] Date of Birth: 12-21-1965  Transition of [facility] CM/SW [place], RN Phone [phone]  10/18/2021, 4:15 PM  Clinical Narrative:     Transition of Care (TOC) Screening Note   Patient Details  Name: [name] Date of Birth: [birth year]   Transition of Care [facility]) CM/SW Contact:    Angelita Ingles, RN Phone Number: 10/18/2021, 4:15 PM    Transition of Care Department [facility]) has reviewed patient and no TOC needs have been identified at this time. We will continue to monitor patient advancement through interdisciplinary progression rounds. If new patient transition needs arise, please place a TOC consult. TOC will speak with Husband for high rick screening          Expected Discharge Plan and Services                                                 Social Determinants of Health (SDOH) Interventions    Readmission Risk Interventions No flowsheet data found.

## 2021-10-18 NOTE — Progress Notes (Signed)
Nutrition Follow-up  DOCUMENTATION CODES:   Not applicable  INTERVENTION:   Continue TF via OG tube: Vital AF 1.2 at 65 ml/h (1560 ml per day)  Provides 1872 kcal, 117 gm protein, 1265 ml free water daily.  NUTRITION DIAGNOSIS:   Inadequate oral intake related to inability to eat as evidenced by NPO status.  Ongoing   GOAL:   Provide needs based on ASPEN/SCCM guidelines  Met with TF  MONITOR:   Labs, I & O's, Weight trends, TF tolerance, Vent status  REASON FOR ASSESSMENT:   Consult Enteral/tube feeding initiation and management  ASSESSMENT:   Pt with PMH significant for drug abuse, asthma, type 2 DM, HLD, PTSD/anxiety, and chronic pancreatitis admitted with acute hypoxic respiratory failure and acute pulmonary edema requiring intubation.   Discussed patient in ICU rounds and with RN today. S/P tracheostomy 1/7. NG tube was placed on 1/7, tube is looped with tip in the distal body of the stomach.  Currently receiving Vital AF 1.2 at 65 ml/h via NG tube. Tolerating without difficulty.  Sedation meds have been reduced; remains on Dilaudid.  Plans for vent weaning today.   Patient remains intubated on ventilator support via trach. MV: 12.5 L/min Temp (24hrs), Avg:100.7 F (38.2 C), Min:99.4 F (37.4 C), Max:102.2 F (39 C)   Labs reviewed. Phos 2.4 (L) CBG: 216-274  Medications reviewed and include Novolog, Semglee, Protonix, Senokot, Dilaudid.   Admission weight 76 kg Current weight 72.1 kg  I/O net +5.2 L since admission  Diet Order:   Diet Order             Diet NPO time specified  Diet effective midnight                   EDUCATION NEEDS:   Not appropriate for education at this time  Skin:  Skin Assessment: Reviewed RN Assessment (skin tears to R buttocks & L face)  Last BM:  1/9 type 7 rectal tube  Height:   Ht Readings from Last 1 Encounters:  10/02/21 5' 6"  (1.676 m)    Weight:   Wt Readings from Last 1 Encounters:   10/18/21 72.1 kg    BMI:  Body mass index is 25.66 kg/m.  Estimated Nutritional Needs:   Kcal:  1960  Protein:  110-130 gm  Fluid:  >/= 2 L    Lucas Mallow, RD, LDN, CNSC Please refer to Amion for contact information.

## 2021-10-18 NOTE — Progress Notes (Signed)
NAME:  [name], MRN:  [id number], DOB:  22-Aug-1966, LOS: 58 ADMISSION DATE:  09/30/2021, CONSULTATION DATE: 09/30/2021 REFERRING MD: ED provider, CHIEF COMPLAINT: Acute hypoxic respiratory failure  History of Present Illness:  55 yo black female with pmh asthma, dm2, hyperlipidemia, PTSD/anxiety and chronic pancreatitis presented via EMS on cpap machine with sats in the mid 60's. Pt is intubated and sedated so ROS and complete history are unobtainable at this time. All history is obtained from ED and chart review.    Per report pt was at home partaking in cocaine with her husband when she began having sudden and severe sob. She was altered, unable to hold herself up in bed, minimally responsive. She had minimal air movement while on cpap and decision was made to emergently intubate pt. CXR revealed pulmonary edema. BP was noted to be extremely elevated >099 systolic. She was started on nitro infusion.    CCM was consulted for admission 2/2 pt's intubated status.   Pertinent  Medical History  DM2 Asthma Hyperlipidemia PTSD, anxiety Chronic pancreatitis  Significant Hospital Events: Including procedures, antibiotic start and stop dates in addition to other pertinent events   12/23: intubated 12/24: febrile, abx started for CAP 12/25 struggled with sedation - failed ketamine, precedex, dilaudid. Febrile and cultured 12/27 on Dilaudid and Versed drip 12/29 failed weaning this morning with tachypnea 12/31 was extubated 1230, failed extubation within an hour with bronchospasms, stridorous breathing. 01/01 started on empirical antibiotics with vancomycin and cefepime for HCAP 01/02 MRSA screen negative, vancomycin discontinued 01/04 Hypotensive episode, started on Levophed 01/06 extubated to BiPAP, failed 01/07 tracheostomy  Interim History / Subjective:  Fever of 102.2 overnight Awake and following commands this morning Husband at bedside  Objective   Blood pressure (!) 110/52,  pulse 73, temperature 100.2 F (37.9 C), temperature source Oral, resp. rate (!) 25, height 5\' 6"  (1.676 m), weight 72.1 kg, last menstrual period 03/07/2012, SpO2 96 %.    Vent Mode: PRVC FiO2 (%):  [30 %] 30 % Set Rate:  [24 bmp] 24 bmp Vt Set:  [410 mL] 410 mL PEEP:  [5 cmH20] 5 cmH20 Pressure Support:  [12 cmH20] 12 cmH20 Plateau Pressure:  [17 cmH20-18 cmH20] 17 cmH20   Intake/Output Summary (Last 24 hours) at 10/18/2021 0730 Last data filed at 10/18/2021 0700 Gross per 24 hour  Intake 1476.1 ml  Output 1070 ml  Net 406.1 ml   Filed Weights   10/16/21 0500 10/17/21 0453 10/18/21 0500  Weight: 71.5 kg 71 kg 72.1 kg    Examination: General: Acutely ill-appearing female.  NAD. HENT: Trach stable in place Lungs: Scattered rhonchi.  Cardiovascular: RRR. No m/r/g Abdomen: Soft.  NT/ND.  Normal BS. Extremities: Warm. Well perfused. Neuro: Alert and oriented.  Follows commands.  Moves all extremities.  Labs Reviewed: WBC 19.5, K+ 3.6, creatinine 0.92 CBGs 225, 216, 274 Resolved Hospital Problem list   Septic shock AKI Hypertensive crisis 2/2 cocaine use Hypotension Assessment & Plan:  Acute hypoxemic hypercarbic respiratory failure Stable. Remains on minimal vent settings with FIO2 of 30%. Will aim for trach collar today.  -- Transition to trach collar today as tolerated -- Follow-up repeat chest x-ray --VAP bundle  Community-acquired pneumonia Acute pulmonary edema Leukocytosis Status post 5-day course of azithromycin and Rocephin for CAP followed by 5-day course of cefepime.  Continue to have persistent leukocytosis with intermittent fevers.  Fever of 102.2 overnight.  No clear source of infection at this point.  Will evaluate with further studies to  guide need for antibiotics.  --Repeat chest x-ray  --UA, blood cultures, Pro-Calcitonin --Continue DuoNebs --Discontinue foley  Diastolic heart failure Echo 12/24 showed EF 65 to 70%, mild LVH and no valvular  abnormalities.  UOP adequate.  -- Strict I&O's --Keep K+ >4, Mg>2  ICU delirium History of polysubstance abuse  PTSD Continue to wean off sedation.  --Off versed drip --Increase Seroquel to 100 BID --Continue PRN oxy and dilaudid --Delirium precautions  T2DM, uncontrolled (A1c 9.1) CBGs still elevated and in the 200s. Tube feed at goal --Continue Novolog to 8 units q4hr --Continue Semglee 10 units daily --CBG goal 140-180 --Continue tube feed   Best Practice (right click and "Reselect all SmartList Selections" daily)   Diet/type: tubefeeds DVT prophylaxis: LMWH GI prophylaxis: PPI Lines: N/A Foley:  N/A Code Status:  full code Last date of multidisciplinary goals of care discussion [1/9, updated spouse at bedside]  Labs   CBC: Recent Labs  Lab 10/13/21 0455 10/14/21 0330 10/15/21 0354 10/15/21 0742 10/16/21 0308 10/17/21 0328 10/18/21 0500  WBC 19.0* 15.3* 18.0*  --  14.9* 18.5* 19.5*  NEUTROABS 13.0*  --   --   --   --   --   --   HGB 9.4* 9.7* 9.7* 9.5* 10.1* 9.7* 8.8*  HCT 30.3* 31.9* 31.2* 28.0* 32.1* 30.6* 28.7*  MCV 96.5 97.3 95.4  --  94.1 95.3 95.3  PLT 408* 460* 443*  --  456* 408* 270    Basic Metabolic Panel: Recent Labs  Lab 10/14/21 0330 10/15/21 0354 10/15/21 0742 10/16/21 0308 10/16/21 1253 10/17/21 0328 10/18/21 0500  NA 139 140 143 141 144 143 145  K 3.3* 3.8 3.5 2.8* 4.0 3.6 3.6  CL 102 108  --  106 108 108 110  CO2 28 25  --  26 26 27 28   GLUCOSE 116* 161*  --  211* 191* 258* 239*  BUN 26* 20  --  26* 27* 26* 23*  CREATININE 1.02* 1.00  --  1.02* 0.99 0.93 0.92  CALCIUM 9.2 9.9  --  9.3 9.4 9.1 9.2  MG 2.1 2.4  --  2.1  --  2.2 2.1  PHOS 4.1 2.0*  --  4.3  --  3.2 2.4*   GFR: Estimated Creatinine Clearance: 68.6 mL/min (by C-G formula based on SCr of 0.92 mg/dL). Recent Labs  Lab 10/15/21 0354 10/16/21 0308 10/17/21 0328 10/18/21 0500  WBC 18.0* 14.9* 18.5* 19.5*    Liver Function Tests: No results for input(s):  AST, ALT, ALKPHOS, BILITOT, PROT, ALBUMIN in the last 168 hours. No results for input(s): LIPASE, AMYLASE in the last 168 hours. No results for input(s): AMMONIA in the last 168 hours.  ABG    Component Value Date/Time   PHART 7.525 (H) 10/15/2021 0742   PCO2ART 31.1 (L) 10/15/2021 0742   PO2ART 55 (L) 10/15/2021 0742   HCO3 25.7 10/15/2021 0742   TCO2 27 10/15/2021 0742   ACIDBASEDEF 1.0 10/02/2021 0625   O2SAT 92.0 10/15/2021 0742     Critical care time: 30

## 2021-10-18 NOTE — Progress Notes (Deleted)
Labs that were ordered are delayed d/t patient being exteremly hard stick. RN Attempted to draw from PIV's with minimal blood return. E-link notified.

## 2021-10-18 NOTE — Progress Notes (Signed)
88ml Dilaudid and 11ml versed wasted in cactus with Stan Head.

## 2021-10-18 NOTE — Progress Notes (Signed)
56 year old smoker who presented 12/23 with acute pulmonary edema in the setting of cocaine use and hypertensive emergency requiring mechanical ventilation.  Failed extubation x2 and eventually required tracheostomy due to persistent delirium on 1/7.  Remains critically ill, on vent, failed weaning attempts. On low-dose Dilaudid drip, febrile 102 overnight, husband at bedside On exam -intermittently follows one-step commands, RASS +1, S1-S2 regular, decreased breath sounds bilateral, no rhonchi, soft nontender abdomen, appears weak and deconditioned.  Labs show hyperglycemia, mild hypokalemia, mild hypophosphatemia, low procalcitonin, persistent leukocytosis UA shows bacteria with 0-5 white cells  Chest x-ray independently reviewed shows improved bibasilar aeration  Impression/plan Acute hypoxic/hypercarbic respiratory failure -spontaneous breathing trials now that mental status is improved with goal trach collar  Acute encephalopathy/ICU delirium/polysubstance abuse-increase Seroquel to 100 twice daily -Attempt to come off Dilaudid drip now that on standing oxycodone 5 every 6 -Continue clonazepam 1 every 8  Persistent fever/leukocytosis -no clear source of infection, UA does not show significant white cells, obtain blood cultures, procalcitonin is low and chest x-ray does not show obvious infiltrate, hold off on antibiotics  Uncontrolled diabetes-Lantus has just been increased to 10 units Continue SSI and tube feed coverage  Husband updated at bedside Discussed with multidisciplinary team  My independent critical care time was 33 minutes  Alyssia Heese V. Elsworth Soho MD

## 2021-10-18 NOTE — Progress Notes (Signed)
SLP Cancellation Note  Patient Details Name: [name] MRN: [id number] DOB: 1966-03-31   Cancelled treatment:       Reason Eval/Treat Not Completed: Medical issues which prohibited therapy  Attempted to see pt again for swallowing evaluation and trial of PMSV.  Pt continues to require mechanical ventilation at this time. SLP to follow for assessment when pt is tolerating trach collar and is appropriate to proceed.   Yula Crotwell B. Quentin Ore, [facility], [place] Speech Language Pathologist Office: [phone]  [name] 10/18/2021, 10:11 AM

## 2021-10-18 NOTE — Progress Notes (Signed)
Inpatient Rehabilitation Admissions Coordinator   Rehab consult received. No family at bedside. I will follow up with her spouse by phone, to begin discussions concerning rehab venue options pending weaning to trach collar and her tolerance for participation with therapies.  Danne Baxter, RN, MSN Rehab Admissions Coordinator [phone] 10/18/2021 11:56 AM

## 2021-10-18 NOTE — Progress Notes (Signed)
[facility] ADULT ICU REPLACEMENT PROTOCOL   The patient does apply for the [facility] Adult ICU Electrolyte Replacment Protocol based on the criteria listed below:   1.Exclusion criteria: TCTS patients, ECMO patients, and Dialysis patients 2. Is GFR >/= 30 ml/min? Yes.    Patient's GFR today is >60 3. Is SCr </= 2? Yes.   Patient's SCr is 0.92 mg/dL 4. Did SCr increase >/= 0.5 in 24 hours? No. 5.Pt's weight >40kg  Yes.   6. Abnormal electrolyte(s): K 3.6  7. Electrolytes replaced per protocol   Christeen Douglas 10/18/2021 5:55 AM

## 2021-10-19 DIAGNOSIS — J9601 Acute respiratory failure with hypoxia: Secondary | ICD-10-CM | POA: Diagnosis not present

## 2021-10-19 LAB — GLUCOSE, CAPILLARY
Glucose-Capillary: 148 mg/dL — ABNORMAL HIGH (ref 70–99)
Glucose-Capillary: 203 mg/dL — ABNORMAL HIGH (ref 70–99)
Glucose-Capillary: 212 mg/dL — ABNORMAL HIGH (ref 70–99)
Glucose-Capillary: 246 mg/dL — ABNORMAL HIGH (ref 70–99)
Glucose-Capillary: 251 mg/dL — ABNORMAL HIGH (ref 70–99)
Glucose-Capillary: 74 mg/dL (ref 70–99)

## 2021-10-19 LAB — CBC
HCT: 28.1 % — ABNORMAL LOW (ref 36.0–46.0)
Hemoglobin: 8.5 g/dL — ABNORMAL LOW (ref 12.0–15.0)
MCH: 29.2 pg (ref 26.0–34.0)
MCHC: 30.2 g/dL (ref 30.0–36.0)
MCV: 96.6 fL (ref 80.0–100.0)
Platelets: 377 10*3/uL (ref 150–400)
RBC: 2.91 MIL/uL — ABNORMAL LOW (ref 3.87–5.11)
RDW: 13.4 % (ref 11.5–15.5)
WBC: 17.7 10*3/uL — ABNORMAL HIGH (ref 4.0–10.5)
nRBC: 0 % (ref 0.0–0.2)

## 2021-10-19 LAB — MAGNESIUM: Magnesium: 1.8 mg/dL (ref 1.7–2.4)

## 2021-10-19 LAB — BASIC METABOLIC PANEL
Anion gap: 9 (ref 5–15)
BUN: 20 mg/dL (ref 6–20)
CO2: 26 mmol/L (ref 22–32)
Calcium: 9.3 mg/dL (ref 8.9–10.3)
Chloride: 112 mmol/L — ABNORMAL HIGH (ref 98–111)
Creatinine, Ser: 0.75 mg/dL (ref 0.44–1.00)
GFR, Estimated: >60 mL/min (ref >60)
Glucose, Bld: 227 mg/dL — ABNORMAL HIGH (ref 70–99)
Potassium: 4 mmol/L (ref 3.5–5.1)
Sodium: 147 mmol/L — ABNORMAL HIGH (ref 135–145)

## 2021-10-19 LAB — PHOSPHORUS: Phosphorus: 2.9 mg/dL (ref 2.5–4.6)

## 2021-10-19 LAB — PROCALCITONIN: Procalcitonin: <0.1 ng/mL

## 2021-10-19 MED ORDER — QUETIAPINE FUMARATE 100 MG PO TABS
100.0000 mg | ORAL_TABLET | Freq: Every day | ORAL | Status: DC
  Administered 2021-10-20: 100 mg
  Filled 2021-10-19: qty 1

## 2021-10-19 MED ORDER — OXYCODONE HCL 5 MG PO TABS
10.0000 mg | ORAL_TABLET | Freq: Three times a day (TID) | ORAL | Status: DC
  Administered 2021-10-19 – 2021-10-25 (×16): 10 mg
  Filled 2021-10-19 (×18): qty 2

## 2021-10-19 MED ORDER — CLONAZEPAM 1 MG PO TABS
1.0000 mg | ORAL_TABLET | Freq: Two times a day (BID) | ORAL | Status: DC
  Administered 2021-10-19 – 2021-10-20 (×2): 1 mg
  Filled 2021-10-19 (×3): qty 1

## 2021-10-19 MED ORDER — CLONIDINE HCL 0.2 MG PO TABS
0.1000 mg | ORAL_TABLET | Freq: Three times a day (TID) | ORAL | Status: DC
  Administered 2021-10-19: 0.1 mg
  Filled 2021-10-19: qty 1

## 2021-10-19 MED ORDER — CLONIDINE HCL 0.2 MG PO TABS
0.2000 mg | ORAL_TABLET | Freq: Four times a day (QID) | ORAL | Status: DC
  Administered 2021-10-19 – 2021-10-21 (×7): 0.2 mg
  Filled 2021-10-19 (×8): qty 1

## 2021-10-19 MED ORDER — INSULIN GLARGINE-YFGN 100 UNIT/ML ~~LOC~~ SOLN
15.0000 [IU] | Freq: Every day | SUBCUTANEOUS | Status: DC
  Administered 2021-10-19 – 2021-10-20 (×2): 15 [IU] via SUBCUTANEOUS
  Filled 2021-10-19 (×3): qty 0.15

## 2021-10-19 MED ORDER — OXYCODONE HCL 5 MG PO TABS: 10.0000 mg | ORAL_TABLET | Freq: Four times a day (QID) | ORAL | Status: DC

## 2021-10-19 NOTE — Progress Notes (Addendum)
Pt agitated hitting at nurses while doing mouth care an suctioning. Precedex restarted see [facility]

## 2021-10-19 NOTE — Progress Notes (Signed)
NAME:  [name], MRN:  [id number], DOB:  07-18-1966, LOS: 51 ADMISSION DATE:  09/30/2021, CONSULTATION DATE: 09/30/2021 REFERRING MD: ED provider, CHIEF COMPLAINT: Acute hypoxic respiratory failure  History of Present Illness:  56 yo black female with pmh asthma, dm2, hyperlipidemia, PTSD/anxiety and chronic pancreatitis presented via EMS on cpap machine with sats in the mid 60's. Pt is intubated and sedated so ROS and complete history are unobtainable at this time. All history is obtained from ED and chart review.    Per report pt was at home partaking in cocaine with her husband when she began having sudden and severe sob. She was altered, unable to hold herself up in bed, minimally responsive. She had minimal air movement while on cpap and decision was made to emergently intubate pt. CXR revealed pulmonary edema. BP was noted to be extremely elevated >093 systolic. She was started on nitro infusion.    CCM was consulted for admission 2/2 pt's intubated status.   Pertinent  Medical History  DM2 Asthma Hyperlipidemia PTSD, anxiety Chronic pancreatitis  Significant Hospital Events: Including procedures, antibiotic start and stop dates in addition to other pertinent events   12/23: intubated 12/24: febrile, abx started for CAP 12/25 struggled with sedation - failed ketamine, precedex, dilaudid. Febrile and cultured 12/27 on Dilaudid and Versed drip 12/29 failed weaning this morning with tachypnea 12/31 was extubated 1230, failed extubation within an hour with bronchospasms, stridorous breathing. 01/01 started on empirical antibiotics with vancomycin and cefepime for HCAP 01/02 MRSA screen negative, vancomycin discontinued 01/04 Hypotensive episode, started on Levophed 01/06 extubated to BiPAP, failed 01/07 tracheostomy  Interim History / Subjective:  Patient had some emesis and transient desaturation overnight Patient remains sleepy this morning Husband at bedside  Objective    Blood pressure (!) 142/64, pulse 66, temperature 99.7 F (37.6 C), temperature source Axillary, resp. rate (!) 23, height 5\' 6"  (1.676 m), weight 72 kg, last menstrual period 03/07/2012, SpO2 100 %.    Vent Mode: PRVC FiO2 (%):  [30 %] 30 % Set Rate:  [20 bmp] 20 bmp Vt Set:  [410 mL] 410 mL PEEP:  [5 cmH20] 5 cmH20 Plateau Pressure:  [11 cmH20-19 cmH20] 19 cmH20   Intake/Output Summary (Last 24 hours) at 10/19/2021 0718 Last data filed at 10/19/2021 0600 Gross per 24 hour  Intake 1204.24 ml  Output 650 ml  Net 554.24 ml   Filed Weights   10/17/21 0453 10/18/21 0500 10/19/21 0500  Weight: 71 kg 72.1 kg 72 kg    Examination: General: Acutely ill-appearing female.  NAD. HENT: Trach stable in place Lungs: CTAB. Still with some rhonchi. Cardiovascular: RRR. No m/r/g Abdomen: Soft.  NT/ND.  Normal BS. Extremities: Warm. Well perfused. Neuro: Sedated.  RASS -1.  Moves all extremities.  Labs Reviewed: WBC 17.7, Hgb 8.5 NA 147, creatinine 0.75 Procalcitonin <0.10 CBGs 182, 148, 203  Resolved Hospital Problem list   Septic shock AKI Hypertensive crisis 2/2 cocaine use Hypotension Assessment & Plan:  Acute hypoxemic hypercarbic respiratory failure Some transient desaturation overnight.  Remains on minimal vent settings with FIO2 of 30%.  Mental status much improved.  Hope to transition to trach collar today. --Transition to trach collar today --VAP bundle  Community-acquired pneumonia Acute pulmonary edema Leukocytosis Status post 5-day course of azithromycin and Rocephin for CAP followed by 5-day course of cefepime.  Continues to have intermittent fevers however white count trending down.  Repeat chest x-ray does not show any signs of pneumonia.  Procalcitonin does not indicate  any respiratory infection.  UA negative. -- Pending blood cultures, holding off antibiotics at the moment --Continue DuoNebs  Diastolic heart failure Echo 12/24 showed EF 65 to 70%, mild LVH and  no valvular abnormalities. UOP slowing down. --Close monitoring UOP -- Strict I&O's --Keep K+ >4, Mg>2  ICU delirium History of polysubstance abuse  PTSD Weaning off sedation.  Started on Precedex for agitation yesterday --Start clonidine 0.1 mg TID, wean off Precedex --Continue Seroquel 200 mg BID -- Increase [place] to 10 mg TID, continue prn Dilaudid --Delirium precautions  T2DM, uncontrolled (A1c 9.1) CBGs relatively well controlled. --Continue Novolog to 8 units q4hr --Continue Semglee 10 units daily --CBG goal 140-180 --Continue tube feed   Best Practice (right click and "Reselect all SmartList Selections" daily)   Diet/type: tubefeeds DVT prophylaxis: LMWH GI prophylaxis: PPI Lines: N/A Foley:  N/A Code Status:  full code Last date of multidisciplinary goals of care discussion [1/11, updated spouse at bedside]  Labs   CBC: Recent Labs  Lab 10/13/21 0455 10/14/21 0330 10/15/21 0354 10/15/21 0742 10/16/21 0308 10/17/21 0328 10/18/21 0500 10/19/21 0430  WBC 19.0*   < > 18.0*  --  14.9* 18.5* 19.5* 17.7*  NEUTROABS 13.0*  --   --   --   --   --   --   --   HGB 9.4*   < > 9.7* 9.5* 10.1* 9.7* 8.8* 8.5*  HCT 30.3*   < > 31.2* 28.0* 32.1* 30.6* 28.7* 28.1*  MCV 96.5   < > 95.4  --  94.1 95.3 95.3 96.6  PLT 408*   < > 443*  --  456* 408* 366 377   < > = values in this interval not displayed.    Basic Metabolic Panel: Recent Labs  Lab 10/15/21 0354 10/15/21 0742 10/16/21 0308 10/16/21 1253 10/17/21 0328 10/18/21 0500 10/19/21 0430  NA 140   < > 141 144 143 145 147*  K 3.8   < > 2.8* 4.0 3.6 3.6 4.0  CL 108  --  106 108 108 110 112*  CO2 25  --  26 26 27 28 26   GLUCOSE 161*  --  211* 191* 258* 239* 227*  BUN 20  --  26* 27* 26* 23* 20  CREATININE 1.00  --  1.02* 0.99 0.93 0.92 0.75  CALCIUM 9.9  --  9.3 9.4 9.1 9.2 9.3  MG 2.4  --  2.1  --  2.2 2.1 1.8  PHOS 2.0*  --  4.3  --  3.2 2.4* 2.9   < > = values in this interval not displayed.    GFR: Estimated Creatinine Clearance: 78.9 mL/min (by C-G formula based on SCr of 0.75 mg/dL). Recent Labs  Lab 10/16/21 0308 10/17/21 0328 10/18/21 0500 10/19/21 0430  PROCALCITON  --   --  0.16  --   WBC 14.9* 18.5* 19.5* 17.7*    Liver Function Tests: No results for input(s): AST, ALT, ALKPHOS, BILITOT, PROT, ALBUMIN in the last 168 hours. No results for input(s): LIPASE, AMYLASE in the last 168 hours. No results for input(s): AMMONIA in the last 168 hours.  ABG    Component Value Date/Time   PHART 7.525 (H) 10/15/2021 0742   PCO2ART 31.1 (L) 10/15/2021 0742   PO2ART 55 (L) 10/15/2021 0742   HCO3 25.7 10/15/2021 0742   TCO2 27 10/15/2021 0742   ACIDBASEDEF 1.0 10/02/2021 0625   O2SAT 92.0 10/15/2021 0742     Critical care time: 30

## 2021-10-19 NOTE — Progress Notes (Signed)
PT Cancellation Note  Patient Details Name: [name] MRN: [id number] DOB: 25-Feb-1966   Cancelled Treatment:    Reason Eval/Treat Not Completed: Patient not medically ready.  Pt ventilated and sedated.  May not even be ready to see this p.m.  Will return if able today OR check on 1/12. 10/19/2021  Amanda Carne., PT Acute Rehabilitation Services [phone]  (pager) [phone]  (office)   [name] 10/19/2021, 2:07 PM

## 2021-10-19 NOTE — Progress Notes (Signed)
SLP Cancellation Note  Patient Details Name: [name] MRN: [id number] DOB: December 09, 1965   Cancelled treatment:       Reason Eval/Treat Not Completed: Other (comment). Pt ventilated and sedated this am. Goal is for ATC but RN unsure when that may happen. Will f/u tomorrow   Lynann Beaver 10/19/2021, 10:19 AM

## 2021-10-20 DIAGNOSIS — J9601 Acute respiratory failure with hypoxia: Secondary | ICD-10-CM | POA: Diagnosis not present

## 2021-10-20 LAB — GLUCOSE, CAPILLARY
Glucose-Capillary: 187 mg/dL — ABNORMAL HIGH (ref 70–99)
Glucose-Capillary: 229 mg/dL — ABNORMAL HIGH (ref 70–99)
Glucose-Capillary: 164 mg/dL — ABNORMAL HIGH (ref 70–99)
Glucose-Capillary: 184 mg/dL — ABNORMAL HIGH (ref 70–99)
Glucose-Capillary: 117 mg/dL — ABNORMAL HIGH (ref 70–99)
Glucose-Capillary: 150 mg/dL — ABNORMAL HIGH (ref 70–99)

## 2021-10-20 LAB — PROCALCITONIN: Procalcitonin: <0.1 ng/mL

## 2021-10-20 MED ORDER — MAGNESIUM SULFATE 2 GM/50ML IV SOLN
2.0000 g | Freq: Once | INTRAVENOUS | Status: AC
  Administered 2021-10-20: 2 g via INTRAVENOUS
  Filled 2021-10-20: qty 50

## 2021-10-20 MED ORDER — QUETIAPINE FUMARATE 100 MG PO TABS: 100.0000 mg | ORAL_TABLET | Freq: Every day | ORAL | Status: DC

## 2021-10-20 MED ORDER — LISINOPRIL 20 MG PO TABS
20.0000 mg | ORAL_TABLET | Freq: Every day | ORAL | Status: DC
  Administered 2021-10-20 – 2021-10-25 (×6): 20 mg
  Filled 2021-10-20: qty 2
  Filled 2021-10-20 (×2): qty 1
  Filled 2021-10-20 (×3): qty 2

## 2021-10-20 MED ORDER — CLONAZEPAM 0.5 MG PO TABS
0.5000 mg | ORAL_TABLET | Freq: Two times a day (BID) | ORAL | Status: DC
  Administered 2021-10-20 – 2021-10-25 (×10): 0.5 mg
  Filled 2021-10-20 (×10): qty 1

## 2021-10-20 NOTE — Progress Notes (Signed)
Physical Therapy Treatment Patient Details Name: [name] MRN: [id number] DOB: 1965/12/22 Today's Date: 10/20/2021   History of Present Illness Pt is a 56 y.o. female admitted 09/30/21 after becoming minimally responsive after doing cocaine with husband, required emergent intubation 12/23. ETT 12/23-12/31, reintubated 12/31-1/6, failed again; s/p trach 1/7. PMH includes asthma, DM2, HLD, PTSD, anxiety, chronic pancreatitis.    PT Comments    Pt making slow progress with mobility. Was able to follow ~80% of 1 step commands during session. Fatigues easily and will need to incr activity tolerance in order to be appropriate for inpatient rehab.    Recommendations for follow up therapy are one component of a multi-disciplinary discharge planning process, led by the attending physician.  Recommendations may be updated based on patient status, additional functional criteria and insurance authorization.  Follow Up Recommendations  Acute inpatient rehab (3hours/day)     Assistance Recommended at Discharge Frequent or constant Supervision/Assistance  Patient can return home with the following Two people to help with walking and/or transfers;Help with stairs or ramp for entrance;Other (comment) (Use of mechanical lift for OOB)   Equipment Recommendations  Wheelchair (measurements PT);Wheelchair cushion (measurements PT);Other (comment) (hoyer lift)    Recommendations for Other Services       Precautions / Restrictions Precautions Precautions: Fall;Other (comment) Precaution Comments: Vent via trach, foley cath, rectal tube, NGT, bilateral mittens. Restrictions Weight Bearing Restrictions: No     Mobility  Bed Mobility Overal bed mobility: Needs Assistance             General bed mobility comments: Placed bed in chair position and initially pt required +2 max assist to bring trunk forward from bed into more upright sitting position. Pt progressed so that by end of session pt able  to bring trunk forward x 5 with +1 mod assist.    Transfers Overall transfer level: Needs assistance                 General transfer comment: Deferred 2/2 lethargy/fatigue    Ambulation/Gait                   Stairs             Wheelchair Mobility    Modified Rankin (Stroke Patients Only)       Balance Overall balance assessment: Needs assistance Sitting-balance support: Bilateral upper extremity supported;Feet supported Sitting balance-Leahy Scale: Poor Sitting balance - Comments: Mod assist to stay upright in sitting position with trunk unsupported by bed. Postural control: Right lateral lean                                  Cognition Arousal/Alertness: Lethargic Behavior During Therapy: Flat affect Overall Cognitive Status: Difficult to assess Area of Impairment: Orientation;Attention;Memory;Following commands;Awareness;Problem solving                 Orientation Level: Disoriented to;Person;Place;Time;Situation Current Attention Level: Focused Memory: Decreased short-term memory Following Commands: Follows one step commands with increased time;Follows one step commands inconsistently   Awareness: Intellectual Problem Solving: Slow processing;Decreased initiation;Difficulty sequencing;Requires verbal cues;Requires tactile cues General Comments: Pt with eyes closed ~ 75% of session but would open to command. Pt followed ~90% of 1 step commands.        Exercises General Exercises - Upper Extremity Shoulder Flexion: PROM;Both;10 reps;Supine Shoulder Horizontal ABduction: PROM;Both;10 reps;Supine Shoulder Horizontal ADduction: PROM;Both;10 reps;Supine Elbow Flexion: PROM;Both;10 reps;Supine Elbow Extension: PROM;Both;10 reps;Supine Wrist  Flexion: PROM;Both;10 reps;Supine Wrist Extension: PROM;Both;10 reps;Supine Digit Composite Flexion: PROM;Both;10 reps;Supine Composite Extension: PROM;Both;10 reps;Supine    General  Comments General comments (skin integrity, edema, etc.): VSS on vent via trach      Pertinent Vitals/Pain Pain Assessment: Faces Pain Score: 0-No pain Faces Pain Scale: No hurt Facial Expression: Relaxed, neutral Body Movements: Absence of movements Muscle Tension: Relaxed Compliance with ventilator (intubated pts.): Tolerating ventilator or movement Vocalization (extubated pts.): N/A CPOT Total: 0 Pain Intervention(s): Monitored during session    Home Living                          Prior Function            PT Goals (current goals can now be found in the care plan section) Progress towards PT goals: Progressing toward goals    Frequency    Min 3X/week      PT Plan Current plan remains appropriate    Co-evaluation              AM-PAC PT "6 Clicks" Mobility   Outcome Measure  Help needed turning from your back to your side while in a flat bed without using bedrails?: Total Help needed moving from lying on your back to sitting on the side of a flat bed without using bedrails?: Total Help needed moving to and from a bed to a chair (including a wheelchair)?: Total Help needed standing up from a chair using your arms (e.g., wheelchair or bedside chair)?: Total Help needed to walk in hospital room?: Total Help needed climbing 3-5 steps with a railing? : Total 6 Click Score: 6    End of Session Equipment Utilized During Treatment: Oxygen (vent) Activity Tolerance: Patient limited by fatigue;Patient limited by lethargy Patient left: in bed;with call bell/phone within reach;with bed alarm set Nurse Communication: Mobility status PT Visit Diagnosis: Other abnormalities of gait and mobility (R26.89);Muscle weakness (generalized) (M62.81)     Time: 3016-0109 PT Time Calculation (min) (ACUTE ONLY): 21 min  Charges:  $Therapeutic Activity: 8-22 mins                     [place] Pager [phone] Office  [place] 10/20/2021, 2:08 PM

## 2021-10-20 NOTE — Progress Notes (Addendum)
NAME:  [name], MRN:  [id number], DOB:  02/13/1966, LOS: 5 ADMISSION DATE:  09/30/2021, CONSULTATION DATE: 09/30/2021 REFERRING MD: ED provider, CHIEF COMPLAINT: Acute hypoxic respiratory failure  History of Present Illness:  56 yo black female with pmh asthma, dm2, hyperlipidemia, PTSD/anxiety and chronic pancreatitis presented via EMS on cpap machine with sats in the mid 60's. Pt is intubated and sedated so ROS and complete history are unobtainable at this time. All history is obtained from ED and chart review.    Per report pt was at home partaking in cocaine with her husband when she began having sudden and severe sob. She was altered, unable to hold herself up in bed, minimally responsive. She had minimal air movement while on cpap and decision was made to emergently intubate pt. CXR revealed pulmonary edema. BP was noted to be extremely elevated >701 systolic. She was started on nitro infusion.    CCM was consulted for admission 2/2 pt's intubated status.   Pertinent  Medical History  DM2 Asthma Hyperlipidemia PTSD, anxiety Chronic pancreatitis  Significant Hospital Events: Including procedures, antibiotic start and stop dates in addition to other pertinent events   12/23: intubated 12/24: febrile, abx started for CAP 12/25 struggled with sedation - failed ketamine, precedex, dilaudid. Febrile and cultured 12/27 on Dilaudid and Versed drip 12/29 failed weaning this morning with tachypnea 12/31 was extubated 1230, failed extubation within an hour with bronchospasms, stridorous breathing. 01/01 started on empirical antibiotics with vancomycin and cefepime for HCAP 01/02 MRSA screen negative, vancomycin discontinued 01/04 Hypotensive episode, started on Levophed 01/06 extubated to BiPAP, failed 01/07 tracheostomy  Interim History / Subjective:  Back on precedex overnight due to agitation Fever of 101 overnight Sleepy this morning Husband at bedside  Objective   Blood  pressure (!) 146/55, pulse 65, temperature (!) 101 F (38.3 C), temperature source Axillary, resp. rate (!) 26, height 5\' 6"  (1.676 m), weight 72 kg, last menstrual period 03/07/2012, SpO2 100 %.    Vent Mode: PRVC FiO2 (%):  [30 %] 30 % Set Rate:  [20 bmp] 20 bmp Vt Set:  [410 mL] 410 mL PEEP:  [5 cmH20] 5 cmH20 Plateau Pressure:  [16 cmH20-19 cmH20] 17 cmH20   Intake/Output Summary (Last 24 hours) at 10/20/2021 0750 Last data filed at 10/20/2021 0600 Gross per 24 hour  Intake 1359.94 ml  Output 850 ml  Net 509.94 ml   Filed Weights   10/17/21 0453 10/18/21 0500 10/19/21 0500  Weight: 71 kg 72.1 kg 72 kg    Examination: General: Acutely ill-appearing female. NAD. HENT: Trach stable in place Lungs: CTAB. No wheezing or rales. Cardiovascular: RRR. No m/r/g.  Abdomen: Soft.  NT/ND.  Normal BS. Extremities: Warm. Well perfused. Neuro: Awake but drowsy.  Moves all extremities.  Labs Reviewed: XBLT 90, 187, [facility] Problem list   Septic shock AKI Hypertensive crisis 2/2 cocaine use Hypotension Assessment & Plan:  Acute hypoxemic hypercarbic respiratory failure Mental status precluded transition to trach collar yesterday.  More awake this morning. --Pressure Support this morning --Transition to ATC later today --Place cortrak --VAP bundle  Community-acquired pneumonia Acute pulmonary edema Leukocytosis Intermittent fevers Status post 5-day course of azithromycin and Rocephin for CAP followed by 5-day course of cefepime.  Work-up for reinfection so far with CXR, procal and UA has been negative.  Blood cultures also negative after 24 hours. --Continue to monitor WBCs, fever curve --Continue DuoNebs  Diastolic heart failure Echo 12/24 showed EF 65 to 70%, mild  LVH and no valvular abnormalities. UOP slowing down. --Close monitoring UOP -- Strict I&O's --Keep K+ >4, Mg>2  ICU delirium History of polysubstance abuse  PTSD Weaning off sedation.   Precedex back on due to agitation overnight. --Continue clonidine 0.2 mg q6h, wean off Precedex --Change Seroquel to 100 qhs --Decrease klonopin to 0.5 mg BID --Continue Oxy 10 mg TID, continue prn Dilaudid --Delirium precautions  T2DM, uncontrolled (A1c 9.1) A low of 74 and high of 229. Will monitor closely and make adjustments as needed tmr.  --Continue Novolog to 8 units q4hr --Continue Semglee 10 units daily --CBG goal 140-180 --Continue tube feed  HTN History of hypertension on Hyzaar 20-25 mg daily at home.  BPs have been slightly high overnight with SBP in the 140s to 160s. --Start lisinopril 20 mg daily --CTM  Best Practice (right click and "Reselect all SmartList Selections" daily)   Diet/type: tubefeeds DVT prophylaxis: LMWH GI prophylaxis: PPI Lines: N/A Foley:  N/A Code Status:  full code Last date of multidisciplinary goals of care discussion [1/12, updated spouse at bedside]  Labs   CBC: Recent Labs  Lab 10/15/21 0354 10/15/21 0742 10/16/21 0308 10/17/21 0328 10/18/21 0500 10/19/21 0430  WBC 18.0*  --  14.9* 18.5* 19.5* 17.7*  HGB 9.7* 9.5* 10.1* 9.7* 8.8* 8.5*  HCT 31.2* 28.0* 32.1* 30.6* 28.7* 28.1*  MCV 95.4  --  94.1 95.3 95.3 96.6  PLT 443*  --  456* 408* 366 917    Basic Metabolic Panel: Recent Labs  Lab 10/15/21 0354 10/15/21 0742 10/16/21 0308 10/16/21 1253 10/17/21 0328 10/18/21 0500 10/19/21 0430  NA 140   < > 141 144 143 145 147*  K 3.8   < > 2.8* 4.0 3.6 3.6 4.0  CL 108  --  106 108 108 110 112*  CO2 25  --  26 26 27 28 26   GLUCOSE 161*  --  211* 191* 258* 239* 227*  BUN 20  --  26* 27* 26* 23* 20  CREATININE 1.00  --  1.02* 0.99 0.93 0.92 0.75  CALCIUM 9.9  --  9.3 9.4 9.1 9.2 9.3  MG 2.4  --  2.1  --  2.2 2.1 1.8  PHOS 2.0*  --  4.3  --  3.2 2.4* 2.9   < > = values in this interval not displayed.   GFR: Estimated Creatinine Clearance: 78.9 mL/min (by C-G formula based on SCr of 0.75 mg/dL). Recent Labs  Lab  10/16/21 0308 10/17/21 0328 10/18/21 0500 10/19/21 0430  PROCALCITON  --   --  0.16 <0.10  WBC 14.9* 18.5* 19.5* 17.7*    Liver Function Tests: No results for input(s): AST, ALT, ALKPHOS, BILITOT, PROT, ALBUMIN in the last 168 hours. No results for input(s): LIPASE, AMYLASE in the last 168 hours. No results for input(s): AMMONIA in the last 168 hours.  ABG    Component Value Date/Time   PHART 7.525 (H) 10/15/2021 0742   PCO2ART 31.1 (L) 10/15/2021 0742   PO2ART 55 (L) 10/15/2021 0742   HCO3 25.7 10/15/2021 0742   TCO2 27 10/15/2021 0742   ACIDBASEDEF 1.0 10/02/2021 0625   O2SAT 92.0 10/15/2021 0742     Critical care time: 30

## 2021-10-20 NOTE — Progress Notes (Signed)
Occupational Therapy Treatment Patient Details Name: [name] MRN: [id number] DOB: Jul 03, 1966 Today's Date: 10/20/2021   History of present illness Pt is a 56 y.o. female admitted 09/30/21 after becoming minimally responsive after doing cocaine with husband, required emergent intubation 12/23. ETT 12/23-12/31, reintubated 12/31-1/6, failed again; s/p trach 1/7. PMH includes asthma, DM2, HLD, PTSD, anxiety, chronic pancreatitis.   OT comments  Patient met with bed in chair position after PT treatment session. OT session with focus on command following, attempts to increase alertness with changes to the environment (all lights on, music playing) and BUE PROM to facilitate soft tissue elongation. Patient kept eyes closed for 98% of treatment session opening eyes several times on command although unable to keep eyes open. Patient followed verbal commands with <50% accuracy. Nods/shakes head in response to some questions. Patient would benefit from continuous acute OT services to maximize safety/independence with ADLs.    Recommendations for follow up therapy are one component of a multi-disciplinary discharge planning process, led by the attending physician.  Recommendations may be updated based on patient status, additional functional criteria and insurance authorization.    Follow Up Recommendations  Acute inpatient rehab (3hours/day)    Assistance Recommended at Discharge Frequent or constant Supervision/Assistance  Patient can return home with the following      Equipment Recommendations  Other (comment) (Defer to next level of care)    Recommendations for Other Services      Precautions / Restrictions Precautions Precautions: Fall Precaution Comments: Vent via trach, foley cath, rectal tube, NGT, bilateral mittens. Restrictions Weight Bearing Restrictions: No       Mobility Bed Mobility Overal bed mobility: Needs Assistance             General bed mobility comments:  Deferred 2/2 lethargy/fatigue. Just finished PT treatment session.    Transfers Overall transfer level: Needs assistance                 General transfer comment: Deferred 2/2 lethargy/fatigue     Balance Overall balance assessment: Needs assistance                                         ADL either performed or assessed with clinical judgement   ADL                                         General ADL Comments: Grossly dependent    Extremity/Trunk Assessment              Vision       Perception     Praxis      Cognition Arousal/Alertness: Lethargic;Suspect due to medications Behavior During Therapy: Flat affect Overall Cognitive Status: Impaired/Different from baseline Area of Impairment: Orientation;Attention;Memory;Following commands;Awareness;Problem solving                 Orientation Level: Disoriented to;Person;Place;Time;Situation Current Attention Level: Focused Memory: Decreased short-term memory Following Commands: Follows one step commands with increased time;Follows one step commands inconsistently   Awareness: Intellectual Problem Solving: Slow processing;Decreased initiation;Difficulty sequencing;Requires verbal cues;Requires tactile cues General Comments: Patient very lethargic keeping eyes closed for 98% of treatment session. Opens eyes on command several times but unable to keep them open. Patient does nod/shake head in response to very few questions. Minimal attempts to mouth  words. Follows 1-step verbal commands with <50% accuracy.          Exercises Exercises: General Upper Extremity General Exercises - Upper Extremity Shoulder Flexion: PROM;Both;10 reps;Supine Shoulder Horizontal ABduction: PROM;Both;10 reps;Supine Shoulder Horizontal ADduction: PROM;Both;10 reps;Supine Elbow Flexion: PROM;Both;10 reps;Supine Elbow Extension: PROM;Both;10 reps;Supine Wrist Flexion: PROM;Both;10  reps;Supine Wrist Extension: PROM;Both;10 reps;Supine Digit Composite Flexion: PROM;Both;10 reps;Supine Composite Extension: PROM;Both;10 reps;Supine   Shoulder Instructions       General Comments VSS on vent via trach    Pertinent Vitals/ Pain       Pain Assessment: Faces Pain Score: 0-No pain Facial Expression: Relaxed, neutral Body Movements: Absence of movements Muscle Tension: Relaxed Compliance with ventilator (intubated pts.): Tolerating ventilator or movement Vocalization (extubated pts.): N/A CPOT Total: 0 Pain Intervention(s): Monitored during session  Home Living                                          Prior Functioning/Environment              Frequency  Min 2X/week        Progress Toward Goals  OT Goals(current goals can now be found in the care plan section)  Progress towards OT goals: Not progressing toward goals - comment (Sedated)  Acute Rehab OT Goals Patient Stated Goal: For patient to get better (per husband) OT Goal Formulation: With family Time For Goal Achievement: 10/31/21 Potential to Achieve Goals: Good ADL Goals Pt Will Perform Eating: with min assist;sitting Pt Will Perform Grooming: with min assist;sitting Pt Will Perform Upper Body Dressing: with min assist;sitting Pt Will Perform Lower Body Dressing: with min assist;sit to/from stand Pt Will Transfer to Toilet: with min assist;bedside commode;stand pivot transfer Pt Will Perform Toileting - Clothing Manipulation and hygiene: with min assist;sit to/from stand Additional ADL Goal #1: Patient will follow 1-step verbal commands with 75% accuracy in prep for ADLs. Additional ADL Goal #2: Patient will maintain static sitting balance for 5-8 min at EOB with close supervision A in prep for ADLs.  Plan Discharge plan remains appropriate;Frequency remains appropriate    Co-evaluation                 AM-PAC OT "6 Clicks" Daily Activity     Outcome Measure    Help from another person eating meals?: Total Help from another person taking care of personal grooming?: Total Help from another person toileting, which includes using toliet, bedpan, or urinal?: Total Help from another person bathing (including washing, rinsing, drying)?: Total Help from another person to put on and taking off regular upper body clothing?: Total Help from another person to put on and taking off regular lower body clothing?: Total 6 Click Score: 6    End of Session Equipment Utilized During Treatment: Oxygen (vent via trach FiO2 30%)  OT Visit Diagnosis: Unsteadiness on feet (R26.81);Muscle weakness (generalized) (M62.81);Other symptoms and signs involving cognitive function   Activity Tolerance Patient limited by lethargy   Patient Left in bed;with call bell/phone within reach;with bed alarm set   Nurse Communication Other (comment) (Response to treatment)        Time: 1209-1228 OT Time Calculation (min): 19 min  Charges: OT General Charges $OT Visit: 1 Visit OT Treatments $Therapeutic Activity: 8-22 mins  Margurette Brener H. OTR/L Supplemental OT, Department of rehab services [phone]  Shraga Custard R H. 10/20/2021, 1:17 PM

## 2021-10-20 NOTE — Progress Notes (Signed)
Patient husband agitated and as a result patient agitated. While suctioning pt, she coughed hard with lots mucus and ventilator became disconnected, pt maintained 98% o2 sat. The husband panicked and started raising his voice and touching the trach. I ask him to please step back and I would take good care of the patient. The resident Dr.Amponash came to bedside and educated patient not to interfere with patient care.

## 2021-10-20 NOTE — Progress Notes (Signed)
0100- Patient coughing, brady into the 30s. Patient suctioned, precedex decreased. Husband at bedside irritable.

## 2021-10-20 NOTE — TOC Initial Note (Signed)
Transition of Care [facility]) - Initial/Assessment Note    Patient Details  Name: [name] MRN: [id number] Date of Birth: Feb 16, 1966  [facility] CM/SW Contact:    Angelita Ingles, RN Phone Number: 10/20/2021, 3:42 PM  Clinical Narrative:                 [facility] consulted for patient with high risk for readmission and disposition needs. CM at bedside, patient is trach/vent and unable to  actively participate in assessment. Husband at bedside and offers to answer questions. Per husband patient is from home where she lives with him and functions independently. Husband states that wife has no DME or home health services at home. Husband states that patient has PCP at [facility] and [facility] Moment at the pain management center. Cm initiated the conversation about options for rehab once the patient is medically stable. Husband says definitely no to [facility] and that he is open to options for rehab for about 2 weeks but only if he is allowed to stay with her because he is not going to leave her side. CM has informed husband that TOC will continue to follow patient  and monitor her progress to determine disposition needs.   Expected Discharge Plan: Long Term Acute Care (LTAC) Barriers to Discharge: Continued Medical Work up   Patient Goals and CMS Choice Patient states their goals for this hospitalization and ongoing recovery are:: Wife unable to speak on vent & trach. Husbands states goal is that she will get better and go home where he can care for her. CMS Medicare.gov Compare Post Acute Care list provided to::  (Doesnt want list right now prefers to wait til patient is doing better and able to wean off vent) Choice offered to / list presented to : Spouse  Expected Discharge Plan and Services Expected Discharge Plan: Long Term Acute Care (LTAC) In-house Referral: Clinical Social Work Discharge Planning Services: CM Consult Post Acute Care Choice: NA Living arrangements for the  past 2 months: Single Family Home                 DME Arranged: N/A DME Agency: NA       HH Arranged: NA          Prior Living Arrangements/Services Living arrangements for the past 2 months: Single Family Home Lives with:: Spouse Patient language and need for interpreter reviewed:: Yes Do you feel safe going back to the place where you live?:  (patient unable to respond)      Need for Family Participation in Patient Care: Yes (Comment) Care giver support system in place?: Yes (comment) Current home services:  (n/a) Criminal Activity/Legal Involvement Pertinent to Current Situation/Hospitalization: No - Comment as needed  Activities of Daily Living      Permission Sought/Granted   Permission granted to share information with : No              Emotional Assessment Appearance:: Appears stated age Attitude/Demeanor/Rapport: Unable to Assess Affect (typically observed): Unable to Assess Orientation: :  (unable to assess) Alcohol / Substance Use: Illicit Drugs Psych Involvement: No (comment)  Admission diagnosis:  Acute pulmonary edema (HCC) [J81.0] Cocaine abuse ([place]) [F14.10] Acute respiratory failure with hypoxia (HCC) [J96.01] Acute hypoxemic respiratory failure ([place]) [J96.01] Patient Active Problem List   Diagnosis Date Noted   Acute respiratory failure with hypoxia ([place]) 09/30/2021   Chronic pancreatitis, unspecified pancreatitis type ([place]) 04/20/2021   Chronic midline low back pain without sciatica 05/15/2018  Uncontrolled type 2 diabetes mellitus with hyperglycemia ([place]) 05/14/2018   Encounter for long-term (current) use of insulin ([place]) 05/14/2018   PTSD (post-traumatic stress disorder) 05/14/2018   Essential hypertension, benign 05/14/2018   Hyperlipidemia 05/14/2018   Mild intermittent asthma 05/14/2018   HCAP (healthcare-associated pneumonia) 10/05/2013   Flu-like symptoms 10/05/2013   Asthma exacerbation 10/05/2013   Diabetes mellitus ([place])  10/05/2013   GERD (gastroesophageal reflux disease) 10/05/2013   Tobacco use 10/05/2013   PCP:  [facility], [place]:   [facility] [place], [place] [place] [address] Phone: [phone] Fax: [phone]     Social Determinants of Health (SDOH) Interventions    Readmission Risk Interventions Readmission Risk Prevention Plan 10/20/2021  Transportation Screening Complete  Medication Review (RN Care Manager) Referral to Pharmacy  PCP or Specialist appointment within 3-5 days of discharge (No Data)  [place] or [place] Complete  SW Recovery Care/Counseling Consult Complete  [place] Patient Refused  Some recent data might be hidden

## 2021-10-20 NOTE — Progress Notes (Signed)
RT placed pt back on full ventilatory support due to pt had vomited. RT will continue to monitor pt.

## 2021-10-21 ENCOUNTER — Inpatient Hospital Stay (HOSPITAL_COMMUNITY): Payer: Medicare HMO

## 2021-10-21 DIAGNOSIS — E440 Moderate protein-calorie malnutrition: Secondary | ICD-10-CM | POA: Insufficient documentation

## 2021-10-21 DIAGNOSIS — J9601 Acute respiratory failure with hypoxia: Secondary | ICD-10-CM | POA: Diagnosis not present

## 2021-10-21 LAB — BASIC METABOLIC PANEL
Anion gap: 5 (ref 5–15)
BUN: 21 mg/dL — ABNORMAL HIGH (ref 6–20)
CO2: 26 mmol/L (ref 22–32)
Calcium: 9 mg/dL (ref 8.9–10.3)
Chloride: 108 mmol/L (ref 98–111)
Creatinine, Ser: 0.88 mg/dL (ref 0.44–1.00)
GFR, Estimated: >60 mL/min (ref >60)
Glucose, Bld: 138 mg/dL — ABNORMAL HIGH (ref 70–99)
Potassium: 3.9 mmol/L (ref 3.5–5.1)
Sodium: 139 mmol/L (ref 135–145)

## 2021-10-21 LAB — GLUCOSE, CAPILLARY
Glucose-Capillary: 130 mg/dL — ABNORMAL HIGH (ref 70–99)
Glucose-Capillary: 108 mg/dL — ABNORMAL HIGH (ref 70–99)
Glucose-Capillary: 110 mg/dL — ABNORMAL HIGH (ref 70–99)
Glucose-Capillary: 150 mg/dL — ABNORMAL HIGH (ref 70–99)
Glucose-Capillary: 132 mg/dL — ABNORMAL HIGH (ref 70–99)
Glucose-Capillary: 150 mg/dL — ABNORMAL HIGH (ref 70–99)

## 2021-10-21 LAB — CBC
HCT: 29.4 % — ABNORMAL LOW (ref 36.0–46.0)
Hemoglobin: 9.3 g/dL — ABNORMAL LOW (ref 12.0–15.0)
MCH: 29.6 pg (ref 26.0–34.0)
MCHC: 31.6 g/dL (ref 30.0–36.0)
MCV: 93.6 fL (ref 80.0–100.0)
Platelets: 335 10*3/uL (ref 150–400)
RBC: 3.14 MIL/uL — ABNORMAL LOW (ref 3.87–5.11)
RDW: 13.2 % (ref 11.5–15.5)
WBC: 15.6 10*3/uL — ABNORMAL HIGH (ref 4.0–10.5)
nRBC: 0 % (ref 0.0–0.2)

## 2021-10-21 MED ORDER — VITAL AF 1.2 CAL PO LIQD
1000.0000 mL | ORAL | Status: DC
  Administered 2021-10-21 – 2021-10-22 (×2): 1000 mL
  Filled 2021-10-21 (×5): qty 1000

## 2021-10-21 MED ORDER — INSULIN GLARGINE-YFGN 100 UNIT/ML ~~LOC~~ SOLN
15.0000 [IU] | Freq: Every day | SUBCUTANEOUS | Status: DC
  Administered 2021-10-22 – 2021-10-23 (×2): 15 [IU] via SUBCUTANEOUS
  Filled 2021-10-21 (×3): qty 0.15

## 2021-10-21 MED ORDER — INSULIN GLARGINE-YFGN 100 UNIT/ML ~~LOC~~ SOLN
15.0000 [IU] | Freq: Once | SUBCUTANEOUS | Status: AC
  Administered 2021-10-21: 15 [IU] via SUBCUTANEOUS
  Filled 2021-10-21: qty 0.15

## 2021-10-21 MED ORDER — POLYETHYLENE GLYCOL 3350 17 G PO PACK: 17.0000 g | PACK | Freq: Every day | ORAL | Status: DC | PRN

## 2021-10-21 MED ORDER — HALOPERIDOL LACTATE 5 MG/ML IJ SOLN
1.0000 mg | Freq: Four times a day (QID) | INTRAMUSCULAR | Status: DC | PRN
  Filled 2021-10-21: qty 1

## 2021-10-21 NOTE — Progress Notes (Signed)
RT removed pt from full ventilatory support and placed pt on ATC 8L 35%. Pt tolerating well at this time with SVS. RT will continue to monitor pt.

## 2021-10-21 NOTE — Progress Notes (Signed)
Inpatient Rehabilitation Admissions Coordinator   I continue to follow patient's progress. Noted ATC 35 % this morning. I will follow up next week.  Danne Baxter, RN, MSN Rehab Admissions Coordinator [phone] 10/21/2021 12:30 PM

## 2021-10-21 NOTE — Progress Notes (Signed)
Patient severely agitated, self proning, sliding out of bed, attempting to pull at line and tubing. She is refusing all interventions including trach suctioning and blood glucose checks. Patient repositioned and reoriented by ongoing and off going nurse. There is no family at  bedside. Precedex  gtt increased per protocol and Elink notified. RN remains at bedside to maintain safety.  Brayton Mars RN.

## 2021-10-21 NOTE — Progress Notes (Signed)
NAME:  [name], MRN:  [id number], DOB:  1966/01/31, LOS: 21 ADMISSION DATE:  09/30/2021, CONSULTATION DATE: 09/30/2021 REFERRING MD: ED provider, CHIEF COMPLAINT: Acute hypoxic respiratory failure  History of Present Illness:  56 yo black female with pmh asthma, dm2, hyperlipidemia, PTSD/anxiety and chronic pancreatitis presented via EMS on cpap machine with sats in the mid 60's. Pt is intubated and sedated so ROS and complete history are unobtainable at this time. All history is obtained from ED and chart review.    Per report pt was at home partaking in cocaine with her husband when she began having sudden and severe sob. She was altered, unable to hold herself up in bed, minimally responsive. She had minimal air movement while on cpap and decision was made to emergently intubate pt. CXR revealed pulmonary edema. BP was noted to be extremely elevated >716 systolic. She was started on nitro infusion.    CCM was consulted for admission 2/2 pt's intubated status.   Pertinent  Medical History  DM2 Asthma Hyperlipidemia PTSD, anxiety Chronic pancreatitis  Significant Hospital Events: Including procedures, antibiotic start and stop dates in addition to other pertinent events   12/23: intubated 12/24: febrile, abx started for CAP 12/25 struggled with sedation - failed ketamine, precedex, dilaudid. Febrile and cultured 12/27 on Dilaudid and Versed drip 12/29 failed weaning this morning with tachypnea 12/31 was extubated 1230, failed extubation within an hour with bronchospasms, stridorous breathing. 01/01 started on empirical antibiotics with vancomycin and cefepime for HCAP 01/02 MRSA screen negative, vancomycin discontinued 01/04 Hypotensive episode, started on Levophed 01/06 extubated to BiPAP, failed 01/07 tracheostomy  Interim History / Subjective:  Reported to have an episode of emesis yesterday, tube feed paused Episodes of bradycardia overnight Awake and interactive this  morning. Husband at bedside  Objective   Blood pressure (!) 144/69, pulse (!) 54, temperature 98.7 F (37.1 C), temperature source Axillary, resp. rate 20, height 5\' 6"  (1.676 m), weight 70.8 kg, last menstrual period 03/07/2012, SpO2 100 %.    Vent Mode: PRVC FiO2 (%):  [30 %] 30 % Set Rate:  [20 bmp] 20 bmp Vt Set:  [410 mL] 410 mL PEEP:  [5 cmH20] 5 cmH20 Pressure Support:  [18 cmH20] 18 cmH20 Plateau Pressure:  [17 cmH20-20 cmH20] 18 cmH20   Intake/Output Summary (Last 24 hours) at 10/21/2021 0727 Last data filed at 10/21/2021 0600 Gross per 24 hour  Intake 901.1 ml  Output 1550 ml  Net -648.9 ml   Filed Weights   10/18/21 0500 10/19/21 0500 10/21/21 0500  Weight: 72.1 kg 72 kg 70.8 kg    Examination: General: Acutely ill-appearing female. NAD. HENT: Trach stable in place Lungs: CTAB. No wheezing or rales. Cardiovascular: Bradycardic.  Regular rhythm. No m/r/g Abdomen: Soft.  NT/ND.  Normal BS. Extremities: Warm. Well perfused. Neuro: Awake.  Follows commands.  Moves all extremities.  Labs Reviewed: CBGs: 150->130->108 WBC 15.6, Hgb 9.3  Resolved Hospital Problem list   Septic shock AKI Hypertensive crisis 2/2 cocaine use Hypotension Assessment & Plan:  Acute hypoxemic hypercarbic respiratory failure Tolerating pressure support this morning.  Hopes to transition to trach collar when patient more awake. --Transition to ATC later today --Place cortrak --VAP bundle  Community-acquired pneumonia Acute pulmonary edema Leukocytosis Status post 5-day course of azithromycin and Rocephin for CAP followed by 5-day course of cefepime. Work-up for reinfection so far with CXR, procal and UA has been negative.  Blood cultures also negative x 2 days.  No fevers in the last 24  hours. --Continue to monitor WBCs, fever curve --Continue DuoNebs  Diastolic heart failure Echo 12/24 showed EF 65 to 70%, mild LVH and no valvular abnormalities. UOP of 1.5 L in the last 24  hours. --Close monitoring UOP --Strict I&O's --Keep K+ >4, Mg>2  ICU delirium History of polysubstance abuse  PTSD More awake this morning.  On minimal sedation medication.  Slowly weaning down Precedex.  Currently at 0.6. --Discontinue oral clonidine and Seroquel --Continue klonopin to 0.5 mg BID --Continue Oxy 10 mg TID, continue prn Dilaudid --Delirium precautions  T2DM, uncontrolled (A1c 9.1) Had an episode of emesis yesterday requiring tube feed to be paused.  CBG down to 108 this morning.  Plan to place core track today. --Please call track, resume tube feeds --Hold off basal insulin for now, give later this afternoon after resuming tube feeds --Continue Novolog to 8 units q4hr --CBG goal 140-180  HTN History of hypertension on Hyzaar 20-25 mg daily at home. BP slightly improved overnight.  SBP in the 110s to 140s. --Continue lisinopril 20 mg daily --Discontinue clonidine --CTM  Best Practice (right click and "Reselect all SmartList Selections" daily)   Diet/type: tubefeeds DVT prophylaxis: LMWH GI prophylaxis: PPI Lines: N/A Foley:  N/A Code Status:  full code Last date of multidisciplinary goals of care discussion [1/14, husband updated at bedside]  Labs   CBC: Recent Labs  Lab 10/16/21 0308 10/17/21 0328 10/18/21 0500 10/19/21 0430 10/21/21 0410  WBC 14.9* 18.5* 19.5* 17.7* 15.6*  HGB 10.1* 9.7* 8.8* 8.5* 9.3*  HCT 32.1* 30.6* 28.7* 28.1* 29.4*  MCV 94.1 95.3 95.3 96.6 93.6  PLT 456* 408* [phone]    Basic Metabolic Panel: Recent Labs  Lab 10/15/21 0354 10/15/21 0742 10/16/21 0308 10/16/21 1253 10/17/21 0328 10/18/21 0500 10/19/21 0430 10/21/21 0410  NA 140   < > 141 144 143 145 147* 139  K 3.8   < > 2.8* 4.0 3.6 3.6 4.0 3.9  CL 108  --  106 108 108 110 112* 108  CO2 25  --  26 26 27 28 26 26   GLUCOSE 161*  --  211* 191* 258* 239* 227* 138*  BUN 20  --  26* 27* 26* 23* 20 21*  CREATININE 1.00  --  1.02* 0.99 0.93 0.92 0.75 0.88   CALCIUM 9.9  --  9.3 9.4 9.1 9.2 9.3 9.0  MG 2.4  --  2.1  --  2.2 2.1 1.8  --   PHOS 2.0*  --  4.3  --  3.2 2.4* 2.9  --    < > = values in this interval not displayed.   GFR: Estimated Creatinine Clearance: 66 mL/min (by C-G formula based on SCr of 0.88 mg/dL). Recent Labs  Lab 10/17/21 0328 10/18/21 0500 10/19/21 0430 10/20/21 0544 10/21/21 0410  PROCALCITON  --  0.16 <0.10 <0.10  --   WBC 18.5* 19.5* 17.7*  --  15.6*    Liver Function Tests: No results for input(s): AST, ALT, ALKPHOS, BILITOT, PROT, ALBUMIN in the last 168 hours. No results for input(s): LIPASE, AMYLASE in the last 168 hours. No results for input(s): AMMONIA in the last 168 hours.  ABG    Component Value Date/Time   PHART 7.525 (H) 10/15/2021 0742   PCO2ART 31.1 (L) 10/15/2021 0742   PO2ART 55 (L) 10/15/2021 0742   HCO3 25.7 10/15/2021 0742   TCO2 27 10/15/2021 0742   ACIDBASEDEF 1.0 10/02/2021 0625   O2SAT 92.0 10/15/2021 0742  Critical care time: 30

## 2021-10-21 NOTE — Evaluation (Signed)
[facility] Speaking Valve - Evaluation Patient Details  Name: [name] MRN: [id number] Date of Birth: November 18, 1965  Today's Date: 10/21/2021 Time: 4097-3532 SLP Time Calculation (min) (ACUTE ONLY): 22 min  Past Medical History:  Past Medical History:  Diagnosis Date   Asthma    Diabetes mellitus without complication ([place])    GERD (gastroesophageal reflux disease)    Heart murmur    from childhood rheumatic fever, per patient   Hyperlipidemia    Pneumonia 12/14   Wears partial dentures    top and bottom partials   Past Surgical History:  Past Surgical History:  Procedure Laterality Date   ANTERIOR LAT LUMBAR FUSION Left 07/31/2013   Procedure: ANTERIOR LATERAL LUMBAR FUSION 1 LEVEL/Left sided lumbar 3-4 lateral interbody fusion with instrumentation and allograft.;  Surgeon: Sinclair Ship, MD;  Location: [facility];  Service: Orthopedics;  Laterality: Left;  Left sided lumbar 3-4 lateral interbody fusion with instrumentation and allograft.   APPENDECTOMY  30- 40 years   carpal tunnel     right arm   CARPAL TUNNEL RELEASE Right 12/15/2013   Procedure: RIGHT CARPAL TUNNEL RELEASE ENDOSCOPIC;  Surgeon: Jolyn Nap, MD;  Location: [facility];  Service: Orthopedics;  Laterality: Right;   LATERAL EPICONDYLE RELEASE Right 12/15/2013   Procedure: RIGHT ULNAR NEUROPLASTY AT ELBOW ;  Surgeon: Jolyn Nap, MD;  Location: [facility];  Service: Orthopedics;  Laterality: Right;   LUMBAR FUSION     HPI:  t is a 56 y.o. female admitted 09/30/21 after becoming minimally responsive after doing cocaine with husband, required emergent intubation 12/23. ETT 12/23-12/31, reintubated 12/31-1/6, failed again; s/p trach 1/7. PMH includes asthma, DM2, HLD, PTSD, anxiety, chronic pancreatitis.    Assessment / Plan / Recommendation  Clinical Impression  Pt has been on ATC today and was able to participate in PMV eval while off the vent.  Cuff was deflated at  baseline.  Currently pt has a #6 Sh[place]. Secretions were minimal during evaluation.  PMV placed and pt demonstrated excellent toleration over 15 minute period with no change in vital signs.  She had good upper airway access; no backflow upon intermittent valve removal.  Voice was low volume and hoarse - pt presented with spontaneous talking.  She required moderate cues to increase volume and slow down speech rate.  Speech was approx 60% intelligible - as she talked more, confusion became more evident, as might be anticipated. Recommend that pt use valve with full supervision intially.  D/W RN. Sign posted at [facility]. Pt should be ready for swallow eval next few days. SLP Visit Diagnosis: Aphonia (R49.1)    SLP Assessment  Patient needs continued Speech [facility]    Recommendations for follow up therapy are one component of a multi-disciplinary discharge planning process, led by the attending physician.  Recommendations may be updated based on patient status, additional functional criteria and insurance authorization.  Follow Up Recommendations  Acute inpatient rehab (3hours/day)    Assistance Recommended at Discharge    Functional Status Assessment Patient has had a recent decline in their functional status and demonstrates the ability to make significant improvements in function in a reasonable and predictable amount of time.  Frequency and Duration min 3x week  2 weeks    PMSV Trial Able to redirect subglottic air through upper airway: Yes Able to Attain Phonation: Yes Voice Quality: Hoarse;Low vocal intensity Able to Expectorate Secretions: Yes Level of Secretion Expectoration with PMSV: Tracheal Breath Support for  Phonation: Mildly decreased Intelligibility: Intelligibility reduced Word: 75-100% accurate Phrase: 50-74% accurate Sentence: 50-74% accurate Respirations During Trial: 28 SpO2 During Trial: 100 % Pulse During Trial: 58   Tracheostomy Tube  Additional  Tracheostomy Tube Assessment Fenestrated: No    Vent Dependency  Vent Dependent: No FiO2 (%): 35 %    Cuff Deflation Trial Tolerated Cuff Deflation: Yes Length of Time for Cuff Deflation Trial:  (deflated upon entering room) Behavior: Alert;Cooperative  [name], [place] CCC/SLP Acute Rehabilitation Services Office number [phone] Pager [phone]        [name] 10/21/2021, 2:42 PM

## 2021-10-21 NOTE — Procedures (Signed)
Cortrak  Tube Type:  Cortrak - 43 inches Tube Location:  Left nare Initial Placement:  Stomach Secured by: Bridle Technique Used to Measure Tube Placement:  Marking at nare/corner of mouth Cortrak Secured At:  63 cm  Cortrak Tube Team Note:  Consult received to place a Cortrak feeding tube.   X-ray is required, abdominal x-ray has been ordered by the Cortrak team. Please confirm tube placement before using the Cortrak tube.   If the tube becomes dislodged please keep the tube and contact the Cortrak team at [email or web address] ([email or web address]) for replacement.  If after hours and replacement cannot be delayed, place a NG tube and confirm placement with an abdominal x-ray.   Koleen Distance MS, RD, LDN Please refer to [facility] for RD and/or RD on-call/weekend/after hours pager

## 2021-10-21 NOTE — Progress Notes (Signed)
[facility] Progress Note Patient Name: Hiilei Gerst DOB: 06-08-1966 MRN: [id number]   Date of Service  10/21/2021  HPI/Events of Note  Patient with agitated delirium despite Precedex, Dilaudid and Klonopin.  eICU Interventions  Will add PRN low dose Haldol as a narcotic and Benzodiazepine sparing measure given that she's on tracheostomy collar at this point.        Kerry Kass Jahaira Earnhart 10/21/2021, 8:29 PM

## 2021-10-21 NOTE — Progress Notes (Signed)
Physical Therapy Treatment Patient Details Name: [name] MRN: [id number] DOB: [birth year] Today's Date: 10/21/2021   History of Present Illness Pt is a 56 y.o. female admitted 09/30/21 after becoming minimally responsive after doing cocaine with husband, required emergent intubation 12/23. ETT 12/23-12/31, reintubated 12/31-1/6, failed again; s/p trach 1/7. PMH includes asthma, DM2, HLD, PTSD, anxiety, chronic pancreatitis.    PT Comments    Pt much more alert and participatory than yesterday. Worked on sitting EOB and on beginning to stand. Continue to recommend inpatient rehab when pt medically improves.    Recommendations for follow up therapy are one component of a multi-disciplinary discharge planning process, led by the attending physician.  Recommendations may be updated based on patient status, additional functional criteria and insurance authorization.  Follow Up Recommendations  Acute inpatient rehab (3hours/day)     Assistance Recommended at Discharge Frequent or constant Supervision/Assistance  Patient can return home with the following Two people to help with walking and/or transfers;Help with stairs or ramp for entrance;Other (comment)   Equipment Recommendations  Wheelchair (measurements PT);Wheelchair cushion (measurements PT)    Recommendations for Other Services       Precautions / Restrictions Precautions Precautions: Fall;Other (comment) Precaution Comments: trach,  rectal tube, coretrak     Mobility  Bed Mobility Overal bed mobility: Needs Assistance Bed Mobility: Supine to Sit;Sit to Supine     Supine to sit: +2 for physical assistance;Mod assist Sit to supine: +2 for physical assistance;Max assist   General bed mobility comments: Assist to bring legs off of bed, elevate trunk into sitting, and bring hips to EOB. Assist to lower trunk and bring legs back up into bed.    Transfers Overall transfer level: Needs assistance Equipment used: 2 person  hand held assist Transfers: Sit to/from Stand Sit to Stand: +2 physical assistance;Max assist           General transfer comment: Assist to bring hips up. Pt with trunk flexed and posterior legs braced on bed with knees hyperextended    Ambulation/Gait                   Stairs             Wheelchair Mobility    Modified Rankin (Stroke Patients Only)       Balance Overall balance assessment: Needs assistance Sitting-balance support: Bilateral upper extremity supported;Single extremity supported;Feet supported Sitting balance-Leahy Scale: Poor Sitting balance - Comments: Sat EOB with min guard to min assist     Standing balance-Leahy Scale: Zero Standing balance comment: +2 max assist                            Cognition Arousal/Alertness: Awake/alert Behavior During Therapy: Flat affect Overall Cognitive Status: Difficult to assess                                 General Comments: Pt alert and following 1 step commands, mouthing words and attempting to write Functional Status Assessment: Patient has had a recent decline in their functional status and demonstrates the ability to make significant improvements in function in a reasonable and predictable amount of time.      Exercises      General Comments General comments (skin integrity, edema, etc.): VSS on trach collar      Pertinent Vitals/Pain Pain Assessment: No/denies pain Facial Expression: Relaxed, neutral Body Movements:  Absence of movements Muscle Tension: Relaxed Compliance with ventilator (intubated pts.): Tolerating ventilator or movement Vocalization (extubated pts.): N/A CPOT Total: 0    Home Living                          Prior Function            PT Goals (current goals can now be found in the care plan section) Acute Rehab PT Goals Patient Stated Goal: pt unable Progress towards PT goals: Progressing toward goals     Frequency    Min 3X/week      PT Plan Current plan remains appropriate    Co-evaluation              AM-PAC PT "6 Clicks" Mobility   Outcome Measure  Help needed turning from your back to your side while in a flat bed without using bedrails?: A Lot Help needed moving from lying on your back to sitting on the side of a flat bed without using bedrails?: Total Help needed moving to and from a bed to a chair (including a wheelchair)?: Total Help needed standing up from a chair using your arms (e.g., wheelchair or bedside chair)?: Total Help needed to walk in hospital room?: Total Help needed climbing 3-5 steps with a railing? : Total 6 Click Score: 7    End of Session Equipment Utilized During Treatment: Oxygen Activity Tolerance: Patient tolerated treatment well Patient left: in bed;with call bell/phone within reach;with bed alarm set Nurse Communication: Mobility status PT Visit Diagnosis: Other abnormalities of gait and mobility (R26.89);Muscle weakness (generalized) (M62.81)     Time: 8502-7741 PT Time Calculation (min) (ACUTE ONLY): 18 min  Charges:  $Therapeutic Activity: 8-22 mins                     [place] Pager [phone] Office [place] 10/21/2021, 3:44 PM

## 2021-10-21 NOTE — Progress Notes (Signed)
Nutrition Follow-up  DOCUMENTATION CODES:   Non-severe (moderate) malnutrition in context of acute illness/injury  INTERVENTION:   Continue TF via Cortrak tube: Vital AF 1.2 at 20 ml/h, recommend increase by 10 ml every 4 hours to goal rate of 70 ml/h (1680 ml per day)  Provides 2016 kcal, 126 gm protein, 1362 ml free water daily.  If patient demonstrates poor tolerance of tube feeding, consider adding a prokinetic such as Reglan.   NUTRITION DIAGNOSIS:   Moderate Malnutrition related to acute illness (VDRF) as evidenced by mild fat depletion, mild muscle depletion.  Ongoing   GOAL:   Patient will meet greater than or equal to 90% of their needs  Met with TF  MONITOR:   Vent status, Labs, TF tolerance, Skin  REASON FOR ASSESSMENT:   Consult Enteral/tube feeding initiation and management  ASSESSMENT:   Pt with PMH significant for drug abuse, asthma, type 2 DM, HLD, PTSD/anxiety, and chronic pancreatitis admitted with acute hypoxic respiratory failure and acute pulmonary edema requiring intubation.   Discussed patient in ICU rounds and with RN today. S/P tracheostomy 1/7. Currently on trach collar.  Cortrak placed this morning. Tip is at the pylorus. TF has been held d/t vomiting 1/12. Plans to resume trickle feedings today. Hopeful for better tolerance with tip at the pylorus.   Spoke with patient and her husband at bedside. Husband reports that her legs have always been very small. She is ready to go home. Asked for something to eat and drink. Explained why patient cannot have anything to eat or drink at this time. Discussed tube feeding and how it is providing all of her nutrition needs at this time.    Labs reviewed.  CBG: 130-108-110  Medications reviewed and include Novolog, Semglee, Protonix, Precedex.   Admission weight 76 kg Current weight 70.8 kg  I/O net +5.4 L since admission  Patient meets criteria for moderate malnutrition with mild depletion of  muscle and subcutaneous fat mass.  Nutrition Focused Physical Exam:  Flowsheet Row Most Recent Value  Orbital Region Mild depletion  Upper Arm Region Mild depletion  Thoracic and Lumbar Region No depletion  Buccal Region Mild depletion  Temple Region Mild depletion  Clavicle Bone Region No depletion  Clavicle and Acromion Bone Region No depletion  Scapular Bone Region No depletion  Dorsal Hand No depletion  Patellar Region Mild depletion  Anterior Thigh Region Mild depletion  Posterior Calf Region Severe depletion  Edema (RD Assessment) None  Hair Reviewed  Eyes Reviewed  Mouth Reviewed  Skin Reviewed  Nails Reviewed        Diet Order:   Diet Order             Diet NPO time specified  Diet effective midnight                   EDUCATION NEEDS:   Education needs have been addressed  Skin:  Skin Assessment: Reviewed RN Assessment (skin tear to face)  Last BM:  1/12 type 7 rectal tube  Height:   Ht Readings from Last 1 Encounters:  10/02/21 5' 6"  (1.676 m)    Weight:   Wt Readings from Last 1 Encounters:  10/21/21 70.8 kg    BMI:  Body mass index is 25.19 kg/m.  Estimated Nutritional Needs:   Kcal:  1900-2100  Protein:  110-130 gm  Fluid:  >/= 2 L    Lucas Mallow, RD, LDN, CNSC Please refer to Amion for contact information.

## 2021-10-22 DIAGNOSIS — J9601 Acute respiratory failure with hypoxia: Secondary | ICD-10-CM | POA: Diagnosis not present

## 2021-10-22 LAB — POCT I-STAT 7, (LYTES, BLD GAS, ICA,H+H)
Acid-Base Excess: 2 mmol/L (ref 0.0–2.0)
Bicarbonate: 25.5 mmol/L (ref 20.0–28.0)
Calcium, Ion: 1.31 mmol/L (ref 1.15–1.40)
HCT: 26 % — ABNORMAL LOW (ref 36.0–46.0)
Hemoglobin: 8.8 g/dL — ABNORMAL LOW (ref 12.0–15.0)
O2 Saturation: 91 %
Patient temperature: 98.6
Potassium: 3.6 mmol/L (ref 3.5–5.1)
Sodium: 141 mmol/L (ref 135–145)
TCO2: 27 mmol/L (ref 22–32)
pCO2 arterial: 36.2 mmHg (ref 32.0–48.0)
pH, Arterial: 7.455 — ABNORMAL HIGH (ref 7.350–7.450)
pO2, Arterial: 59 mmHg — ABNORMAL LOW (ref 83.0–108.0)

## 2021-10-22 LAB — BASIC METABOLIC PANEL
Anion gap: 8 (ref 5–15)
BUN: 19 mg/dL (ref 6–20)
CO2: 25 mmol/L (ref 22–32)
Calcium: 9 mg/dL (ref 8.9–10.3)
Chloride: 106 mmol/L (ref 98–111)
Creatinine, Ser: 0.66 mg/dL (ref 0.44–1.00)
GFR, Estimated: >60 mL/min (ref >60)
Glucose, Bld: 110 mg/dL — ABNORMAL HIGH (ref 70–99)
Potassium: 3.3 mmol/L — ABNORMAL LOW (ref 3.5–5.1)
Sodium: 139 mmol/L (ref 135–145)

## 2021-10-22 LAB — GLUCOSE, CAPILLARY
Glucose-Capillary: 101 mg/dL — ABNORMAL HIGH (ref 70–99)
Glucose-Capillary: 165 mg/dL — ABNORMAL HIGH (ref 70–99)
Glucose-Capillary: 166 mg/dL — ABNORMAL HIGH (ref 70–99)
Glucose-Capillary: 97 mg/dL (ref 70–99)
Glucose-Capillary: 123 mg/dL — ABNORMAL HIGH (ref 70–99)
Glucose-Capillary: 66 mg/dL — ABNORMAL LOW (ref 70–99)
Glucose-Capillary: 161 mg/dL — ABNORMAL HIGH (ref 70–99)
Glucose-Capillary: 198 mg/dL — ABNORMAL HIGH (ref 70–99)

## 2021-10-22 LAB — CBC
HCT: 27.9 % — ABNORMAL LOW (ref 36.0–46.0)
Hemoglobin: 8.8 g/dL — ABNORMAL LOW (ref 12.0–15.0)
MCH: 29.1 pg (ref 26.0–34.0)
MCHC: 31.5 g/dL (ref 30.0–36.0)
MCV: 92.4 fL (ref 80.0–100.0)
Platelets: 311 10*3/uL (ref 150–400)
RBC: 3.02 MIL/uL — ABNORMAL LOW (ref 3.87–5.11)
RDW: 12.6 % (ref 11.5–15.5)
WBC: 14.7 10*3/uL — ABNORMAL HIGH (ref 4.0–10.5)
nRBC: 0 % (ref 0.0–0.2)

## 2021-10-22 MED ORDER — CHOLESTYRAMINE LIGHT 4 G PO PACK
4.0000 g | PACK | Freq: Three times a day (TID) | ORAL | Status: DC
  Filled 2021-10-22: qty 1

## 2021-10-22 MED ORDER — OXYCODONE HCL 5 MG PO TABS
10.0000 mg | ORAL_TABLET | Freq: Every day | ORAL | Status: DC
  Administered 2021-10-22 – 2021-10-24 (×2): 10 mg
  Filled 2021-10-22 (×2): qty 2

## 2021-10-22 MED ORDER — POTASSIUM CHLORIDE 10 MEQ/50ML IV SOLN
10.0000 meq | INTRAVENOUS | Status: AC
  Administered 2021-10-22 (×4): 10 meq via INTRAVENOUS
  Filled 2021-10-22 (×4): qty 50

## 2021-10-22 MED ORDER — DEXTROSE 50 % IV SOLN
INTRAVENOUS | Status: AC
  Administered 2021-10-22: 25 mL via INTRAVENOUS
  Filled 2021-10-22: qty 50

## 2021-10-22 MED ORDER — OLANZAPINE 5 MG PO TABS
10.0000 mg | ORAL_TABLET | Freq: Every day | ORAL | Status: DC
  Administered 2021-10-22 – 2021-10-24 (×3): 10 mg
  Filled 2021-10-22: qty 2
  Filled 2021-10-22: qty 1
  Filled 2021-10-22: qty 2
  Filled 2021-10-22: qty 1

## 2021-10-22 MED ORDER — CHOLESTYRAMINE 4 G PO PACK
4.0000 g | PACK | Freq: Three times a day (TID) | ORAL | Status: DC
  Administered 2021-10-22: 4 g
  Filled 2021-10-22 (×2): qty 1

## 2021-10-22 MED ORDER — TRAZODONE HCL 50 MG PO TABS
50.0000 mg | ORAL_TABLET | Freq: Every day | ORAL | Status: DC
  Administered 2021-10-22 – 2021-10-24 (×3): 50 mg
  Filled 2021-10-22 (×3): qty 1

## 2021-10-22 MED ORDER — POTASSIUM CHLORIDE 20 MEQ PO PACK
20.0000 meq | PACK | ORAL | Status: AC
  Administered 2021-10-22 (×2): 20 meq
  Filled 2021-10-22 (×2): qty 1

## 2021-10-22 MED ORDER — DEXTROSE 50 % IV SOLN: 25.0000 mL | Freq: Once | INTRAVENOUS | Status: AC

## 2021-10-22 NOTE — Progress Notes (Signed)
NAME:  [name], MRN:  [id number], DOB:  Jan 23, 1966, LOS: 40 ADMISSION DATE:  09/30/2021, CONSULTATION DATE: 09/30/2021 REFERRING MD: ED provider, CHIEF COMPLAINT: Acute hypoxic respiratory failure  History of Present Illness:  57 yo black female with pmh asthma, dm2, hyperlipidemia, PTSD/anxiety and chronic pancreatitis presented via EMS on cpap machine with sats in the mid 60's. Pt is intubated and sedated so ROS and complete history are unobtainable at this time. All history is obtained from ED and chart review.    Per report pt was at home partaking in cocaine with her husband when she began having sudden and severe sob. She was altered, unable to hold herself up in bed, minimally responsive. She had minimal air movement while on cpap and decision was made to emergently intubate pt. CXR revealed pulmonary edema. BP was noted to be extremely elevated >381 systolic. She was started on nitro infusion.    CCM was consulted for admission 2/2 pt's intubated status.   Pertinent  Medical History  DM2 Asthma Hyperlipidemia PTSD, anxiety Chronic pancreatitis  Significant Hospital Events: Including procedures, antibiotic start and stop dates in addition to other pertinent events   12/23: intubated 12/24: febrile, abx started for CAP 12/25 struggled with sedation - failed ketamine, precedex, dilaudid. Febrile and cultured 12/27 on Dilaudid and Versed drip 12/29 failed weaning this morning with tachypnea 12/31 was extubated 1230, failed extubation within an hour with bronchospasms, stridorous breathing. 01/01 started on empirical antibiotics with vancomycin and cefepime for HCAP 01/02 MRSA screen negative, vancomycin discontinued 01/04 Hypotensive episode, started on Levophed 01/06 extubated to BiPAP, failed 01/07 tracheostomy 01/13 Placed on trach collar  Interim History / Subjective:  Agitated overnight requiring increased Precedex infusion Calm this morning with intermittent  agitation Husband at bedside  Objective   Blood pressure (!) 109/55, pulse (!) 54, temperature 98.8 F (37.1 C), resp. rate (!) 30, height 5\' 6"  (1.676 m), weight 73 kg, last menstrual period 03/07/2012, SpO2 94 %.    Vent Mode: PSV;CPAP FiO2 (%):  [30 %-35 %] 35 % Pressure Support:  [18 cmH20] 18 cmH20   Intake/Output Summary (Last 24 hours) at 10/22/2021 0736 Last data filed at 10/22/2021 0700 Gross per 24 hour  Intake 3390.04 ml  Output 1000 ml  Net 2390.04 ml   Filed Weights   10/19/21 0500 10/21/21 0500 10/22/21 0414  Weight: 72 kg 70.8 kg 73 kg    Examination: General: Acutely ill middle-aged woman.  NAD HENT: Trach collar in place Lungs: Some scattered rhonchi but no wheezing or rales. Cardiovascular: Bradycardic.  Regular rhythm. No m/r/g Abdomen: Soft.  NT/ND.  Normal BS. Extremities: Warm. Well perfused. Neuro: Awake and anxious.  Moves all extremities.  Labs Reviewed: CBGs 017,510,258 ABG: 7.4 5/36/59/25.5 WBC 14.7, Hgb 8.8, K+ 3.3  Resolved Hospital Problem list   Septic shock AKI Hypertensive crisis 2/2 cocaine use Hypotension Assessment & Plan:  Acute hypoxemic hypercarbic respiratory failure Patient tolerated trach collar overnight. --Continue trach collar --Out of bed today --VAP bundle  Community-acquired pneumonia Acute pulmonary edema Leukocytosis Status post 5-day course of azithromycin and Rocephin for CAP followed by 5-day course of cefepime.  White count trending down.  No fevers in the past 2 days. --Continue to monitor WBCs, fever curve --Continue DuoNebs  Diastolic heart failure Echo 12/24 showed EF 65 to 70%, mild LVH and no valvular abnormalities.  UOP adequate in the past 24 hours. --Close monitoring UOP --Strict I&O's --Keep K+ >4, Mg>2  ICU delirium History of polysubstance abuse  PTSD Agitated overnight requiring increased Precedex infusion and addition of as needed Haldol. Patient continues to have waxing and waning  consciousness. Plan to trial antipsychotic agents and closely monitor mental status. --Wean off Precedex today --Start Zyprexa 10 mg at bedtime --Start trazodone 50 mg at bedtime --Continue Klonopin and Zoloft --Continue Oxy 10 mg TID, add additional 10 mg at bedtime --Delirium precautions --Discontinue rectal tube  T2DM, uncontrolled (A1c 9.1) Core track placed yesterday.  Tube feeds resumed and CBG is currently at goal. --Continue Semglee 15 units daily --Continue Novolog to 8 units q4hr --CBG goal 140-180  HTN History of hypertension on Hyzaar 20-25 mg daily at home.  SBP in the 110s to 140s. --Continue lisinopril 20 mg daily --CTM  Best Practice (right click and "Reselect all SmartList Selections" daily)   Diet/type: tubefeeds DVT prophylaxis: LMWH GI prophylaxis: PPI Lines: N/A Foley:  N/A Code Status:  full code Last date of multidisciplinary goals of care discussion [1/14, husband updated at bedside]  Labs   CBC: Recent Labs  Lab 10/17/21 0328 10/18/21 0500 10/19/21 0430 10/21/21 0410 10/22/21 0330 10/22/21 0444  WBC 18.5* 19.5* 17.7* 15.6* 14.7*  --   HGB 9.7* 8.8* 8.5* 9.3* 8.8* 8.8*  HCT 30.6* 28.7* 28.1* 29.4* 27.9* 26.0*  MCV 95.3 95.3 96.6 93.6 92.4  --   PLT 408* 366 377 335 311  --     Basic Metabolic Panel: Recent Labs  Lab 10/16/21 0308 10/16/21 1253 10/17/21 0328 10/18/21 0500 10/19/21 0430 10/21/21 0410 10/22/21 0330 10/22/21 0444  NA 141   < > 143 145 147* 139 139 141  K 2.8*   < > 3.6 3.6 4.0 3.9 3.3* 3.6  CL 106   < > 108 110 112* 108 106  --   CO2 26   < > 27 28 26 26 25   --   GLUCOSE 211*   < > 258* 239* 227* 138* 110*  --   BUN 26*   < > 26* 23* 20 21* 19  --   CREATININE 1.02*   < > 0.93 0.92 0.75 0.88 0.66  --   CALCIUM 9.3   < > 9.1 9.2 9.3 9.0 9.0  --   MG 2.1  --  2.2 2.1 1.8  --   --   --   PHOS 4.3  --  3.2 2.4* 2.9  --   --   --    < > = values in this interval not displayed.   GFR: Estimated Creatinine Clearance:  79.4 mL/min (by C-G formula based on SCr of 0.66 mg/dL). Recent Labs  Lab 10/18/21 0500 10/19/21 0430 10/20/21 0544 10/21/21 0410 10/22/21 0330  PROCALCITON 0.16 <0.10 <0.10  --   --   WBC 19.5* 17.7*  --  15.6* 14.7*    Liver Function Tests: No results for input(s): AST, ALT, ALKPHOS, BILITOT, PROT, ALBUMIN in the last 168 hours. No results for input(s): LIPASE, AMYLASE in the last 168 hours. No results for input(s): AMMONIA in the last 168 hours.  ABG    Component Value Date/Time   PHART 7.455 (H) 10/22/2021 0444   PCO2ART 36.2 10/22/2021 0444   PO2ART 59 (L) 10/22/2021 0444   HCO3 25.5 10/22/2021 0444   TCO2 27 10/22/2021 0444   ACIDBASEDEF 1.0 10/02/2021 0625   O2SAT 91.0 10/22/2021 0444     Critical care time: 30

## 2021-10-22 NOTE — Progress Notes (Signed)
[facility] ADULT ICU REPLACEMENT PROTOCOL   The patient does apply for the [facility] Adult ICU Electrolyte Replacment Protocol based on the criteria listed below:   1.Exclusion criteria: TCTS patients, ECMO patients, and Dialysis patients 2. Is GFR >/= 30 ml/min? Yes.    Patient's GFR today is >60 3. Is SCr </= 2? Yes.   Patient's SCr is 0.66 mg/dL 4. Did SCr increase >/= 0.5 in 24 hours? No. 5.Pt's weight >40kg  Yes.   6. Abnormal electrolyte(s):  K 3.3  7. Electrolytes replaced per protocol 8.  Call MD STAT for K+ </= 2.5, Phos </= 1, or Mag </= 1 Physician:  Lowella Bandy R Khalea Ventura 10/22/2021 5:42 AM

## 2021-10-22 NOTE — Progress Notes (Signed)
[facility] Progress Note Patient Name: Marajade Lei DOB: [birth year] MRN: [id number]   Date of Service  10/22/2021  HPI/Events of Note  Patient c/o frequent loose stools. Patient has NGT. Patient requests Imodium.   eICU Interventions  Plan: Questran 4 gm per tube TID.     Intervention Category Major Interventions: Other:  Lysle Dingwall 10/22/2021, 9:34 PM

## 2021-10-23 DIAGNOSIS — J9601 Acute respiratory failure with hypoxia: Secondary | ICD-10-CM | POA: Diagnosis not present

## 2021-10-23 LAB — CBC
HCT: 28.6 % — ABNORMAL LOW (ref 36.0–46.0)
Hemoglobin: 9.4 g/dL — ABNORMAL LOW (ref 12.0–15.0)
MCH: 30.1 pg (ref 26.0–34.0)
MCHC: 32.9 g/dL (ref 30.0–36.0)
MCV: 91.7 fL (ref 80.0–100.0)
Platelets: 311 10*3/uL (ref 150–400)
RBC: 3.12 MIL/uL — ABNORMAL LOW (ref 3.87–5.11)
RDW: 12.8 % (ref 11.5–15.5)
WBC: 14.9 10*3/uL — ABNORMAL HIGH (ref 4.0–10.5)
nRBC: 0 % (ref 0.0–0.2)

## 2021-10-23 LAB — GLUCOSE, CAPILLARY
Glucose-Capillary: 131 mg/dL — ABNORMAL HIGH (ref 70–99)
Glucose-Capillary: 203 mg/dL — ABNORMAL HIGH (ref 70–99)
Glucose-Capillary: 185 mg/dL — ABNORMAL HIGH (ref 70–99)
Glucose-Capillary: 146 mg/dL — ABNORMAL HIGH (ref 70–99)
Glucose-Capillary: 159 mg/dL — ABNORMAL HIGH (ref 70–99)
Glucose-Capillary: 198 mg/dL — ABNORMAL HIGH (ref 70–99)

## 2021-10-23 LAB — CULTURE, BLOOD (ROUTINE X 2)
Culture: NO GROWTH
Culture: NO GROWTH
Special Requests: ADEQUATE
Special Requests: ADEQUATE

## 2021-10-23 LAB — BASIC METABOLIC PANEL
Anion gap: 10 (ref 5–15)
BUN: 17 mg/dL (ref 6–20)
CO2: 23 mmol/L (ref 22–32)
Calcium: 9.2 mg/dL (ref 8.9–10.3)
Chloride: 104 mmol/L (ref 98–111)
Creatinine, Ser: 0.83 mg/dL (ref 0.44–1.00)
GFR, Estimated: >60 mL/min (ref >60)
Glucose, Bld: 162 mg/dL — ABNORMAL HIGH (ref 70–99)
Potassium: 3.8 mmol/L (ref 3.5–5.1)
Sodium: 137 mmol/L (ref 135–145)

## 2021-10-23 LAB — MAGNESIUM: Magnesium: 1.9 mg/dL (ref 1.7–2.4)

## 2021-10-23 MED ORDER — MAGNESIUM SULFATE IN D5W 1-5 GM/100ML-% IV SOLN
1.0000 g | Freq: Once | INTRAVENOUS | Status: AC
  Administered 2021-10-23: 1 g via INTRAVENOUS
  Filled 2021-10-23: qty 100

## 2021-10-23 MED ORDER — NUTRISOURCE FIBER PO PACK
1.0000 | PACK | Freq: Two times a day (BID) | ORAL | Status: DC
  Administered 2021-10-23 – 2021-10-24 (×4): 1
  Filled 2021-10-23 (×6): qty 1

## 2021-10-23 MED ORDER — INSULIN ASPART 100 UNIT/ML IJ SOLN
4.0000 [IU] | INTRAMUSCULAR | Status: DC
  Administered 2021-10-23 – 2021-10-25 (×13): 4 [IU] via SUBCUTANEOUS

## 2021-10-23 MED ORDER — GUAIFENESIN-DM 100-10 MG/5ML PO SYRP
5.0000 mL | ORAL_SOLUTION | ORAL | Status: DC | PRN
  Administered 2021-10-23: 5 mL via ORAL
  Filled 2021-10-23 (×2): qty 5

## 2021-10-23 NOTE — Progress Notes (Signed)
NAME:  [name], MRN:  [id number], DOB:  1966-04-09, LOS: 21 ADMISSION DATE:  09/30/2021, CONSULTATION DATE: 09/30/2021 REFERRING MD: ED provider, CHIEF COMPLAINT: Acute hypoxic respiratory failure  History of Present Illness:  56 yo black female with pmh asthma, dm2, hyperlipidemia, PTSD/anxiety and chronic pancreatitis presented via EMS on cpap machine with sats in the mid 60's. Pt is intubated and sedated so ROS and complete history are unobtainable at this time. All history is obtained from ED and chart review.    Per report pt was at home partaking in cocaine with her husband when she began having sudden and severe sob. She was altered, unable to hold herself up in bed, minimally responsive. She had minimal air movement while on cpap and decision was made to emergently intubate pt. CXR revealed pulmonary edema. BP was noted to be extremely elevated >027 systolic. She was started on nitro infusion.    CCM was consulted for admission 2/2 pt's intubated status.   Pertinent  Medical History  DM2 Asthma Hyperlipidemia PTSD, anxiety Chronic pancreatitis  Significant Hospital Events: Including procedures, antibiotic start and stop dates in addition to other pertinent events   12/23: intubated 12/24: febrile, abx started for CAP 12/25 struggled with sedation - failed ketamine, precedex, dilaudid. Febrile and cultured 12/27 on Dilaudid and Versed drip 12/29 failed weaning this morning with tachypnea 12/31 was extubated 1230, failed extubation within an hour with bronchospasms, stridorous breathing. 01/01 started on empirical antibiotics with vancomycin and cefepime for HCAP 01/02 MRSA screen negative, vancomycin discontinued 01/04 Hypotensive episode, started on Levophed 01/06 extubated to BiPAP, failed 01/07 tracheostomy 01/13 Placed on trach collar  Interim History / Subjective:   Agitation improved overnight with zyprexa and trazodone. Remained on trach collar over past 48  hours  Objective   Blood pressure (!) 129/54, pulse 73, temperature 98.8 F (37.1 C), temperature source Oral, resp. rate (!) 25, height 5\' 6"  (1.676 m), weight 70.3 kg, last menstrual period 03/07/2012, SpO2 95 %.    FiO2 (%):  [35 %-40 %] 40 %   Intake/Output Summary (Last 24 hours) at 10/23/2021 0744 Last data filed at 10/22/2021 2246 Gross per 24 hour  Intake 1010.31 ml  Output 550 ml  Net 460.31 ml   Filed Weights   10/21/21 0500 10/22/21 0414 10/23/21 0510  Weight: 70.8 kg 73 kg 70.3 kg    Examination: General: Acutely ill middle-aged woman.  NAD HENT: Trach collar in place Lungs: course breath sounds. no wheezing or rales. Cardiovascular: rrr. No m/r/g Abdomen: Soft.  NT/ND.  Normal BS. Extremities: Warm. Well perfused. Neuro: Awake and anxious.  Moves all extremities.  Resolved Hospital Problem list   Septic shock AKI Hypertensive crisis 2/2 cocaine use Hypotension  Assessment & Plan:  Acute hypoxemic hypercarbic respiratory failure Patient tolerated trach collar overnight. --Continue trach collar --Out of bed to chair --VAP bundle  Community-acquired pneumonia Acute pulmonary edema Leukocytosis Status post 5-day course of azithromycin and Rocephin for CAP followed by 5-day course of cefepime.  White count trending down.  No fevers in the past 2 days. --Continue to monitor WBCs, fever curve --Continue DuoNebs  Diastolic heart failure Echo 12/24 showed EF 65 to 70%, mild LVH and no valvular abnormalities.  UOP adequate in the past 24 hours. --Close monitoring UOP --Strict I&O's --Keep K+ >4, Mg>2  ICU delirium History of polysubstance abuse  PTSD Agitated overnight requiring increased Precedex infusion and addition of as needed Haldol. Patient continues to have waxing and waning consciousness. Plan  to trial antipsychotic agents and closely monitor mental status. --Wean off Precedex today --Start Zyprexa 10 mg at bedtime --Start trazodone 50 mg at  bedtime --Continue Klonopin and Zoloft --Continue Oxy 10 mg TID, add additional 10 mg at bedtime --Delirium precautions --Discontinue rectal tube  T2DM, uncontrolled (A1c 9.1) Core track placed yesterday.  Tube feeds resumed and CBG is currently at goal. --Continue Semglee 15 units daily --Continue Novolog to 4 units q4hr for tube feed coverage --Continue SSI --CBG goal 140-180  HTN History of hypertension on Hyzaar 20-25 mg daily at home.  SBP in the 110s to 140s. --Continue lisinopril 20 mg daily --CTM  Best Practice (right click and "Reselect all SmartList Selections" daily)   Diet/type: tubefeeds DVT prophylaxis: LMWH GI prophylaxis: PPI Lines: N/A Foley:  N/A Code Status:  full code Last date of multidisciplinary goals of care discussion [1/14, husband updated at bedside]  Labs   CBC: Recent Labs  Lab 10/18/21 0500 10/19/21 0430 10/21/21 0410 10/22/21 0330 10/22/21 0444 10/23/21 0354  WBC 19.5* 17.7* 15.6* 14.7*  --  14.9*  HGB 8.8* 8.5* 9.3* 8.8* 8.8* 9.4*  HCT 28.7* 28.1* 29.4* 27.9* 26.0* 28.6*  MCV 95.3 96.6 93.6 92.4  --  91.7  PLT 366 377 335 311  --  149    Basic Metabolic Panel: Recent Labs  Lab 10/17/21 0328 10/18/21 0500 10/19/21 0430 10/21/21 0410 10/22/21 0330 10/22/21 0444 10/23/21 0354  NA 143 145 147* 139 139 141 137  K 3.6 3.6 4.0 3.9 3.3* 3.6 3.8  CL 108 110 112* 108 106  --  104  CO2 27 28 26 26 25   --  23  GLUCOSE 258* 239* 227* 138* 110*  --  162*  BUN 26* 23* 20 21* 19  --  17  CREATININE 0.93 0.92 0.75 0.88 0.66  --  0.83  CALCIUM 9.1 9.2 9.3 9.0 9.0  --  9.2  MG 2.2 2.1 1.8  --   --   --  1.9  PHOS 3.2 2.4* 2.9  --   --   --   --    GFR: Estimated Creatinine Clearance: 70 mL/min (by C-G formula based on SCr of 0.83 mg/dL). Recent Labs  Lab 10/18/21 0500 10/19/21 0430 10/20/21 0544 10/21/21 0410 10/22/21 0330 10/23/21 0354  PROCALCITON 0.16 <0.10 <0.10  --   --   --   WBC 19.5* 17.7*  --  15.6* 14.7* 14.9*     Liver Function Tests: No results for input(s): AST, ALT, ALKPHOS, BILITOT, PROT, ALBUMIN in the last 168 hours. No results for input(s): LIPASE, AMYLASE in the last 168 hours. No results for input(s): AMMONIA in the last 168 hours.  ABG    Component Value Date/Time   PHART 7.455 (H) 10/22/2021 0444   PCO2ART 36.2 10/22/2021 0444   PO2ART 59 (L) 10/22/2021 0444   HCO3 25.5 10/22/2021 0444   TCO2 27 10/22/2021 0444   ACIDBASEDEF 1.0 10/02/2021 0625   O2SAT 91.0 10/22/2021 0444     Critical care time: n/a    Freda Jackson, MD [facility] Office: [phone]   See Amion for personal pager PCCM on call pager [phone] until 7pm. Please call Elink 7p-7a. [phone]

## 2021-10-23 NOTE — Plan of Care (Signed)

## 2021-10-24 DIAGNOSIS — R410 Disorientation, unspecified: Secondary | ICD-10-CM

## 2021-10-24 LAB — GLUCOSE, CAPILLARY
Glucose-Capillary: 159 mg/dL — ABNORMAL HIGH (ref 70–99)
Glucose-Capillary: 178 mg/dL — ABNORMAL HIGH (ref 70–99)
Glucose-Capillary: 188 mg/dL — ABNORMAL HIGH (ref 70–99)
Glucose-Capillary: 202 mg/dL — ABNORMAL HIGH (ref 70–99)
Glucose-Capillary: 209 mg/dL — ABNORMAL HIGH (ref 70–99)
Glucose-Capillary: 217 mg/dL — ABNORMAL HIGH (ref 70–99)

## 2021-10-24 LAB — BASIC METABOLIC PANEL
Anion gap: 5 (ref 5–15)
BUN: 14 mg/dL (ref 6–20)
CO2: 26 mmol/L (ref 22–32)
Calcium: 9.1 mg/dL (ref 8.9–10.3)
Chloride: 105 mmol/L (ref 98–111)
Creatinine, Ser: 0.73 mg/dL (ref 0.44–1.00)
GFR, Estimated: >60 mL/min (ref >60)
Glucose, Bld: 212 mg/dL — ABNORMAL HIGH (ref 70–99)
Potassium: 4.5 mmol/L (ref 3.5–5.1)
Sodium: 136 mmol/L (ref 135–145)

## 2021-10-24 LAB — CBC
HCT: 29.5 % — ABNORMAL LOW (ref 36.0–46.0)
Hemoglobin: 9.6 g/dL — ABNORMAL LOW (ref 12.0–15.0)
MCH: 30.2 pg (ref 26.0–34.0)
MCHC: 32.5 g/dL (ref 30.0–36.0)
MCV: 92.8 fL (ref 80.0–100.0)
Platelets: 326 10*3/uL (ref 150–400)
RBC: 3.18 MIL/uL — ABNORMAL LOW (ref 3.87–5.11)
RDW: 12.9 % (ref 11.5–15.5)
WBC: 13.6 10*3/uL — ABNORMAL HIGH (ref 4.0–10.5)
nRBC: 0 % (ref 0.0–0.2)

## 2021-10-24 MED ORDER — INSULIN GLARGINE-YFGN 100 UNIT/ML ~~LOC~~ SOLN
20.0000 [IU] | Freq: Every day | SUBCUTANEOUS | Status: DC
  Administered 2021-10-25 – 2021-10-26 (×2): 20 [IU] via SUBCUTANEOUS
  Filled 2021-10-24 (×2): qty 0.2

## 2021-10-24 MED ORDER — FUROSEMIDE 10 MG/ML IJ SOLN
60.0000 mg | Freq: Once | INTRAMUSCULAR | Status: AC
  Administered 2021-10-24: 60 mg via INTRAVENOUS
  Filled 2021-10-24: qty 6

## 2021-10-24 MED ORDER — VITAL AF 1.2 CAL PO LIQD
1000.0000 mL | ORAL | Status: DC
  Filled 2021-10-24 (×2): qty 1000

## 2021-10-24 MED ORDER — ONDANSETRON HCL 4 MG/2ML IJ SOLN
4.0000 mg | Freq: Four times a day (QID) | INTRAMUSCULAR | Status: DC | PRN
  Administered 2021-10-24 – 2021-10-25 (×3): 4 mg via INTRAVENOUS
  Filled 2021-10-24 (×3): qty 2

## 2021-10-24 MED ORDER — VITAL AF 1.2 CAL PO LIQD: 1000.0000 mL | ORAL | Status: DC

## 2021-10-24 NOTE — Progress Notes (Signed)
° °  NAME:  [name], MRN:  [id number], DOB:  1965-12-02, LOS: 24 ADMISSION DATE:  09/30/2021, CONSULTATION DATE: 09/30/2021 REFERRING MD: ED provider, CHIEF COMPLAINT: Acute hypoxic respiratory failure  History of Present Illness:  56 yo black female with pmh asthma, dm2, hyperlipidemia, PTSD/anxiety and chronic pancreatitis presented via EMS on cpap machine with sats in the mid 60's. Pt is intubated and sedated so ROS and complete history are unobtainable at this time. All history is obtained from ED and chart review.    Per report pt was at home partaking in cocaine with her husband when she began having sudden and severe sob. She was altered, unable to hold herself up in bed, minimally responsive. She had minimal air movement while on cpap and decision was made to emergently intubate pt. CXR revealed pulmonary edema. BP was noted to be extremely elevated >409 systolic. She was started on nitro infusion.    CCM was consulted for admission 2/2 pt's intubated status.   Pertinent  Medical History  DM2 Asthma Hyperlipidemia PTSD, anxiety Chronic pancreatitis  Significant Hospital Events: Including procedures, antibiotic start and stop dates in addition to other pertinent events   12/23: intubated 12/24: febrile, abx started for CAP 12/25 struggled with sedation - failed ketamine, precedex, dilaudid. Febrile and cultured 12/27 on Dilaudid and Versed drip 12/29 failed weaning this morning with tachypnea 12/31 was extubated 1230, failed extubation within an hour with bronchospasms, stridorous breathing. 01/01 started on empirical antibiotics with vancomycin and cefepime for HCAP 01/02 MRSA screen negative, vancomycin discontinued 01/04 Hypotensive episode, started on Levophed 01/06 extubated to BiPAP, failed 01/07 tracheostomy 01/13 Placed on trach collar  Interim History / Subjective:  Doing great! Wants to go home. 6-0 cuffed trach in place.  Objective   Blood pressure (!)  141/62, pulse 74, temperature 98.6 F (37 C), temperature source Oral, resp. rate (!) 23, height 5\' 6"  (1.676 m), weight 71.9 kg, last menstrual period 03/07/2012, SpO2 98 %.    FiO2 (%):  [35 %-40 %] 35 %   Intake/Output Summary (Last 24 hours) at 10/24/2021 1053 Last data filed at 10/24/2021 0400 Gross per 24 hour  Intake 1641.11 ml  Output --  Net 1641.11 ml    Filed Weights   10/22/21 0414 10/23/21 0510 10/24/21 0416  Weight: 73 kg 70.3 kg 71.9 kg    Examination: No distress Small whitish secretions Following commands x 4 Ext without edema  Resolved Hospital Problem list   Septic shock AKI Hypertensive crisis 2/2 cocaine use Hypotension  Assessment & Plan:  Acute hypoxemic hypercarbic respiratory failure Community-acquired pneumonia Acute pulmonary edema Leukocytosis Diastolic heart failure ICU delirium History of polysubstance abuse  PTSD T2DM, uncontrolled (A1c 9.1) HTN  Lingering issue with vent dependence was mostly related to mental status which seems improved.  I think we can work toward decannulation  - Downsize to 4-0 cuffless - Will see tomorrow and hopefully cap  Remainder of care per primary  Erskine Emery MD PCCM

## 2021-10-24 NOTE — Progress Notes (Signed)
Patient vomit while pushing meds, husband said is been happening every time she take meds. MD notified. Will continue to monitor.

## 2021-10-24 NOTE — Progress Notes (Signed)
Inpatient Diabetes Program Recommendations  AACE/ADA: New Consensus Statement on Inpatient Glycemic Control (2015)  Target Ranges:  Prepandial:   less than 140 mg/dL      Peak postprandial:   less than 180 mg/dL (1-2 hours)      Critically ill patients:  140 - 180 mg/dL   Lab Results  Component Value Date   GLUCAP 209 (H) 10/24/2021   HGBA1C 9.1 (H) 10/01/2021    Review of Glycemic Control  Latest Reference Range & Units 10/23/21 11:18 10/23/21 15:20 10/23/21 17:20 10/23/21 20:01 10/24/21 00:07 10/24/21 04:15 10/24/21 07:22  Glucose-Capillary 70 - 99 mg/dL 185 (H) 146 (H) 159 (H) 198 (H) 178 (H) 202 (H) 209 (H)   Diabetes history: DM 2 Outpatient Diabetes medications:  Janumet 50-1000 mg bid Lantus 45 units q HS Current orders for Inpatient glycemic control:  Novolog resistant q 4 hours Vital 70 cc/hr Semglee 15 units daily Novolog 4 units q 4 hours  Inpatient Diabetes Program Recommendations:    Consider increasing Semglee to 20 units daily.   Thanks,  Adah Perl, RN, BC-ADM Inpatient Diabetes Coordinator Pager [phone]  (8a-5p)

## 2021-10-24 NOTE — Consult Note (Addendum)
[facility] Psychiatry Consult   Reason for Consult: Severe intermittent agitation, on multiple psych meds Referring Physician:  Dr Starla Link Patient Identification: [name] MRN:  [id number] Principal Diagnosis: Acute respiratory failure with hypoxia [facility]) Diagnosis:  Principal Problem:   Acute respiratory failure with hypoxia ([place]) Active Problems:   Malnutrition of moderate degree   Total Time spent with patient: 30 minutes  Subjective:   [name] is a 56 y.o. female patient with past psychiatric history of PTSD/anxiety and medical history of DM2, HLD, chronic pain, chronic pancreatitis was admitted medically on 09/30/2021 due to oxygen saturation in 60s.  Per chart review patient was at home partaking in cocaine with her husband when she began having sudden and severe SOB.  Patient was intubated initially now with trach collar.  Psych consult placed for severe intermittent agitation by Dr.Alekh. Patient is seen today in the presence of Dr. Dwyane Dee.  History is limited as patient was not able to speak due to her trach collar.  Patient talked briefly by writing on a piece of paper.  Patient wrote "just tired of feeling confined been here over 30 days and just want to be home, feels like jail although I have never been".  Patient wrote that she gets depressed sometimes but her husband is here for her support.  Per patient, she sometimes feels depressed and anxious as she has been here for about 30 days.  Patient denies suicidal ideation, homicidal ideation, auditory or visual hallucinations.  Patient refused to make any changes to her medications.  Encouraged to cooperate with staff.  Patient nodded head "yes".   Past Psychiatric History: PTSD/anxiety  Risk to Self: No Risk to Others: No Prior Inpatient Therapy:  Prior Outpatient Therapy:    Past Medical History:  Past Medical History:  Diagnosis Date   Asthma    Diabetes mellitus without complication (HCC)    GERD  (gastroesophageal reflux disease)    Heart murmur    from childhood rheumatic fever, per patient   Hyperlipidemia    Pneumonia 12/14   Wears partial dentures    top and bottom partials    Past Surgical History:  Procedure Laterality Date   ANTERIOR LAT LUMBAR FUSION Left 07/31/2013   Procedure: ANTERIOR LATERAL LUMBAR FUSION 1 LEVEL/Left sided lumbar 3-4 lateral interbody fusion with instrumentation and allograft.;  Surgeon: Sinclair Ship, MD;  Location: [place];  Service: Orthopedics;  Laterality: Left;  Left sided lumbar 3-4 lateral interbody fusion with instrumentation and allograft.   APPENDECTOMY  30- 40 years   carpal tunnel     right arm   CARPAL TUNNEL RELEASE Right 12/15/2013   Procedure: RIGHT CARPAL TUNNEL RELEASE ENDOSCOPIC;  Surgeon: Jolyn Nap, MD;  Location: [facility];  Service: Orthopedics;  Laterality: Right;   LATERAL EPICONDYLE RELEASE Right 12/15/2013   Procedure: RIGHT ULNAR NEUROPLASTY AT ELBOW ;  Surgeon: Jolyn Nap, MD;  Location: [place] [facility];  Service: Orthopedics;  Laterality: Right;   LUMBAR FUSION     Family History:  Family History  Problem Relation Age of Onset   Diabetes Mother    Heart disease Mother    Family Psychiatric  History:  Social History:  Social History   Substance and Sexual Activity  Alcohol Use No   Comment: beer everynow and then per patient     Social History   Substance and Sexual Activity  Drug Use No    Social History   Socioeconomic History  Marital status: Married    Spouse name: Not on file   Number of children: Not on file   Years of education: Not on file   Highest education level: Not on file  Occupational History   Not on file  Tobacco Use   Smoking status: Former    Types: Cigarettes    Quit date: 08/30/2016    Years since quitting: 5.1   Smokeless tobacco: Never  Substance and Sexual Activity   Alcohol use: No    Comment: beer everynow and then per patient    Drug use: No   Sexual activity: Not Currently    Comment: says she quit 12/14  Other Topics Concern   Not on file  Social History Narrative   ** Merged History Encounter **       Social Determinants of Health   Financial Resource Strain: Not on file  Food Insecurity: Not on file  Transportation Needs: Not on file  Physical Activity: Not on file  Stress: Not on file  Social Connections: Not on file   Additional Social History:    Allergies:  No Known Allergies  Labs:  Results for orders placed or performed during the hospital encounter of 09/30/21 (from the past 48 hour(s))  Glucose, capillary     Status: None   Collection Time: 10/22/21  3:12 PM  Result Value Ref Range   Glucose-Capillary 97 70 - 99 mg/dL    Comment: Glucose reference range applies only to samples taken after fasting for at least 8 hours.  Glucose, capillary     Status: Abnormal   Collection Time: 10/22/21  5:30 PM  Result Value Ref Range   Glucose-Capillary 66 (L) 70 - 99 mg/dL    Comment: Glucose reference range applies only to samples taken after fasting for at least 8 hours.  Glucose, capillary     Status: Abnormal   Collection Time: 10/22/21  5:58 PM  Result Value Ref Range   Glucose-Capillary 123 (H) 70 - 99 mg/dL    Comment: Glucose reference range applies only to samples taken after fasting for at least 8 hours.  Glucose, capillary     Status: Abnormal   Collection Time: 10/22/21  7:16 PM  Result Value Ref Range   Glucose-Capillary 161 (H) 70 - 99 mg/dL    Comment: Glucose reference range applies only to samples taken after fasting for at least 8 hours.  Glucose, capillary     Status: Abnormal   Collection Time: 10/22/21 11:18 PM  Result Value Ref Range   Glucose-Capillary 198 (H) 70 - 99 mg/dL    Comment: Glucose reference range applies only to samples taken after fasting for at least 8 hours.  Glucose, capillary     Status: Abnormal   Collection Time: 10/23/21  3:22 AM  Result Value  Ref Range   Glucose-Capillary 131 (H) 70 - 99 mg/dL    Comment: Glucose reference range applies only to samples taken after fasting for at least 8 hours.  Basic metabolic panel     Status: Abnormal   Collection Time: 10/23/21  3:54 AM  Result Value Ref Range   Sodium 137 135 - 145 mmol/L   Potassium 3.8 3.5 - 5.1 mmol/L   Chloride 104 98 - 111 mmol/L   CO2 23 22 - 32 mmol/L   Glucose, Bld 162 (H) 70 - 99 mg/dL    Comment: Glucose reference range applies only to samples taken after fasting for at least 8 hours.   BUN  17 6 - 20 mg/dL   Creatinine, Ser 0.83 0.44 - 1.00 mg/dL   Calcium 9.2 8.9 - 10.3 mg/dL   GFR, Estimated >60 >60 mL/min    Comment: (NOTE) Calculated using the CKD-EPI Creatinine Equation (2021)    Anion gap 10 5 - 15    Comment: Performed at [facility] [address], [place], [place] [address]  CBC     Status: Abnormal   Collection Time: 10/23/21  3:54 AM  Result Value Ref Range   WBC 14.9 (H) 4.0 - 10.5 K/uL   RBC 3.12 (L) 3.87 - 5.11 MIL/uL   Hemoglobin 9.4 (L) 12.0 - 15.0 g/dL   HCT 28.6 (L) 36.0 - 46.0 %   MCV 91.7 80.0 - 100.0 fL   MCH 30.1 26.0 - 34.0 pg   MCHC 32.9 30.0 - 36.0 g/dL   RDW 12.8 11.5 - 15.5 %   Platelets 311 150 - 400 K/uL   nRBC 0.0 0.0 - 0.2 %    Comment: Performed at [facility], [place] [address], [place],  [address]  Magnesium     Status: None   Collection Time: 10/23/21  3:54 AM  Result Value Ref Range   Magnesium 1.9 1.7 - 2.4 mg/dL    Comment: Performed at [facility] [address], [place], [place] [address]  Glucose, capillary     Status: Abnormal   Collection Time: 10/23/21  7:24 AM  Result Value Ref Range   Glucose-Capillary 203 (H) 70 - 99 mg/dL    Comment: Glucose reference range applies only to samples taken after fasting for at least 8 hours.  Glucose, capillary     Status: Abnormal   Collection Time: 10/23/21 11:18 AM  Result Value Ref Range   Glucose-Capillary 185 (H) 70 - 99 mg/dL     Comment: Glucose reference range applies only to samples taken after fasting for at least 8 hours.  Glucose, capillary     Status: Abnormal   Collection Time: 10/23/21  3:20 PM  Result Value Ref Range   Glucose-Capillary 146 (H) 70 - 99 mg/dL    Comment: Glucose reference range applies only to samples taken after fasting for at least 8 hours.  Glucose, capillary     Status: Abnormal   Collection Time: 10/23/21  5:20 PM  Result Value Ref Range   Glucose-Capillary 159 (H) 70 - 99 mg/dL    Comment: Glucose reference range applies only to samples taken after fasting for at least 8 hours.  Glucose, capillary     Status: Abnormal   Collection Time: 10/23/21  8:01 PM  Result Value Ref Range   Glucose-Capillary 198 (H) 70 - 99 mg/dL    Comment: Glucose reference range applies only to samples taken after fasting for at least 8 hours.   Comment 1 Notify RN    Comment 2 Document in Chart   Glucose, capillary     Status: Abnormal   Collection Time: 10/24/21 12:07 AM  Result Value Ref Range   Glucose-Capillary 178 (H) 70 - 99 mg/dL    Comment: Glucose reference range applies only to samples taken after fasting for at least 8 hours.   Comment 1 Notify RN    Comment 2 Document in Chart   Basic metabolic panel     Status: Abnormal   Collection Time: 10/24/21  4:13 AM  Result Value Ref Range   Sodium 136 135 - 145 mmol/L   Potassium 4.5 3.5 -  5.1 mmol/L   Chloride 105 98 - 111 mmol/L   CO2 26 22 - 32 mmol/L   Glucose, Bld 212 (H) 70 - 99 mg/dL    Comment: Glucose reference range applies only to samples taken after fasting for at least 8 hours.   BUN 14 6 - 20 mg/dL   Creatinine, Ser 0.73 0.44 - 1.00 mg/dL   Calcium 9.1 8.9 - 10.3 mg/dL   GFR, Estimated >60 >60 mL/min    Comment: (NOTE) Calculated using the CKD-EPI Creatinine Equation (2021)    Anion gap 5 5 - 15    Comment: Performed at [facility] [address], [place], [place] [address]  CBC     Status: Abnormal   Collection  Time: 10/24/21  4:13 AM  Result Value Ref Range   WBC 13.6 (H) 4.0 - 10.5 K/uL   RBC 3.18 (L) 3.87 - 5.11 MIL/uL   Hemoglobin 9.6 (L) 12.0 - 15.0 g/dL   HCT 29.5 (L) 36.0 - 46.0 %   MCV 92.8 80.0 - 100.0 fL   MCH 30.2 26.0 - 34.0 pg   MCHC 32.5 30.0 - 36.0 g/dL   RDW 12.9 11.5 - 15.5 %   Platelets 326 150 - 400 K/uL   nRBC 0.0 0.0 - 0.2 %    Comment: Performed at [facility], [place] [address], [place], [place] [address]  Glucose, capillary     Status: Abnormal   Collection Time: 10/24/21  4:15 AM  Result Value Ref Range   Glucose-Capillary 202 (H) 70 - 99 mg/dL    Comment: Glucose reference range applies only to samples taken after fasting for at least 8 hours.   Comment 1 Notify RN    Comment 2 Document in Chart   Glucose, capillary     Status: Abnormal   Collection Time: 10/24/21  7:22 AM  Result Value Ref Range   Glucose-Capillary 209 (H) 70 - 99 mg/dL    Comment: Glucose reference range applies only to samples taken after fasting for at least 8 hours.  Glucose, capillary     Status: Abnormal   Collection Time: 10/24/21 11:20 AM  Result Value Ref Range   Glucose-Capillary 159 (H) 70 - 99 mg/dL    Comment: Glucose reference range applies only to samples taken after fasting for at least 8 hours.    Current Facility-Administered Medications  Medication Dose Route Frequency Provider Last Rate Last Admin   0.9 %  sodium chloride infusion  250 mL Intravenous Continuous Noemi Chapel, MD 10 mL/hr at 10/09/21 2310 250 mL at 10/09/21 2310   0.9 %  sodium chloride infusion   Intra-arterial PRN Spero Geralds, MD       0.9 %  sodium chloride infusion   Intravenous PRN Spero Geralds, MD   Stopped at 10/23/21 1331   acetaminophen (TYLENOL) 160 MG/5ML solution 650 mg  650 mg Per Tube Q4H PRN Rigoberto Noel, MD   650 mg at 10/23/21 1549   albuterol (PROVENTIL) (2.5 MG/3ML) 0.083% nebulizer solution 2.5 mg  2.5 mg Nebulization Q2H PRN Olalere, Adewale A, MD   2.5 mg at 10/07/21 2334    chlorhexidine gluconate (MEDLINE KIT) (PERIDEX) 0.12 % solution 15 mL  15 mL Mouth Rinse BID Audria Nine, DO   15 mL at 10/23/21 2035   Chlorhexidine Gluconate Cloth 2 % PADS 6 each  6 each Topical Daily Audria Nine, DO   6 each at 10/24/21 1000   clonazePAM (  KLONOPIN) tablet 0.5 mg  0.5 mg Per Tube BID Lacinda Axon, MD   0.5 mg at 10/24/21 0836   docusate (COLACE) 50 MG/5ML liquid 100 mg  100 mg Per Tube Daily PRN Collene Gobble, MD       enoxaparin (LOVENOX) injection 40 mg  40 mg Subcutaneous Q24H Candee Furbish, MD   40 mg at 10/24/21 0835   feeding supplement (VITAL AF 1.2 CAL) liquid 1,000 mL  1,000 mL Per Tube Continuous Lacinda Axon, MD 70 mL/hr at 10/23/21 1152 Restarted at 10/23/21 1152   fiber (NUTRISOURCE FIBER) 1 packet  1 packet Per Tube BID Freddi Starr, MD   1 packet at 10/24/21 0837   guaiFENesin-dextromethorphan (ROBITUSSIN DM) 100-10 MG/5ML syrup 5 mL  5 mL Oral Q4H PRN Freda Jackson B, MD   5 mL at 10/23/21 1023   insulin aspart (novoLOG) injection 0-20 Units  0-20 Units Subcutaneous Q4H Spero Geralds, MD   4 Units at 10/24/21 1248   insulin aspart (novoLOG) injection 4 Units  4 Units Subcutaneous Q4H Freddi Starr, MD   4 Units at 10/24/21 1251   [START ON 10/25/2021] insulin glargine-yfgn (SEMGLEE) injection 20 Units  20 Units Subcutaneous Daily Alekh, Kshitiz, MD       lisinopril (ZESTRIL) tablet 20 mg  20 mg Per Tube Daily Lacinda Axon, MD   20 mg at 10/24/21 5621   MEDLINE mouth rinse  15 mL Mouth Rinse 10 times per day Audria Nine, DO   15 mL at 10/24/21 1253   OLANZapine (ZYPREXA) tablet 10 mg  10 mg Per Tube QHS Freddi Starr, MD   10 mg at 10/23/21 2033   ondansetron (ZOFRAN) injection 4 mg  4 mg Intravenous Q6H PRN Aline August, MD   4 mg at 10/24/21 1248   oxyCODONE (Oxy IR/ROXICODONE) immediate release tablet 10 mg  10 mg Per Tube TID Lacinda Axon, MD   10 mg at 10/24/21 3086   oxyCODONE (Oxy  IR/ROXICODONE) immediate release tablet 10 mg  10 mg Per Tube QHS Freda Jackson B, MD   10 mg at 10/22/21 2159   pantoprazole sodium (PROTONIX) 40 mg/20 mL oral suspension 40 mg  40 mg Per Tube QHS Spero Geralds, MD   40 mg at 10/23/21 2035   polyethylene glycol (MIRALAX / GLYCOLAX) packet 17 g  17 g Per Tube Daily PRN Priscella Mann, RPH       rosuvastatin (CRESTOR) tablet 40 mg  40 mg Per Tube Daily Spero Geralds, MD   40 mg at 10/24/21 0836   sertraline (ZOLOFT) tablet 50 mg  50 mg Per Tube Daily Spero Geralds, MD   50 mg at 10/24/21 5784   sodium chloride flush (NS) 0.9 % injection 10-40 mL  10-40 mL Intracatheter Q12H Olalere, Adewale A, MD   10 mL at 10/24/21 0839   sodium chloride flush (NS) 0.9 % injection 10-40 mL  10-40 mL Intracatheter PRN Olalere, Adewale A, MD       traZODone (DESYREL) tablet 50 mg  50 mg Per Tube QHS Freddi Starr, MD   50 mg at 10/23/21 2033   white petrolatum (VASELINE) gel   Topical PRN Laurin Coder, MD   Given at 10/08/21 1143    Musculoskeletal: Strength & Muscle Tone:  Not tested Gait & Station: unable to stand Patient leans: [place]  Exam:  Presentation  General Appearance: Appropriate for Environment Lurline Idol)  Eye Contact:Fair  Speech:-- (Can only write. Cannot speak because of trach)  Speech Volume:No data recorded Handedness:No data recorded  Mood and Affect  Mood:Dysphoric  Affect:Congruent; Constricted   Thought Process  Thought Processes:Coherent  Descriptions of Associations:Tangential  Orientation:No data recorded Thought Content:Logical  History of Schizophrenia/Schizoaffective disorder:No data recorded Duration of Psychotic Symptoms:No data recorded Hallucinations:Hallucinations: None  Ideas of Reference:None  Suicidal Thoughts:Suicidal Thoughts: No  Homicidal Thoughts:Homicidal Thoughts: No   Sensorium  Memory:-- (Not tested  as pt is non  verbal)  Judgment:-- (Not able to  test fully as pt is non verbal)  Insight:-- (Not able to  test fully as pt is non verbal)   Executive Functions  Concentration:Fair  Attention Span:Fair  Recall:-- (Not able to  test fully as pt is non verbal)  Fund of Knowledge:-- (Not able to  test fully as pt is non verbal)  Language:Poor   Psychomotor Activity  Psychomotor Activity:Psychomotor Activity: Normal   Assets  Assets:Desire for Improvement; Resilience; Social Support   Sleep  Sleep:No data recorded  Physical Exam: Physical Exam Vitals reviewed.  Constitutional:      General: She is not in acute distress.    Appearance: She is not toxic-appearing or diaphoretic.  Pulmonary:     Effort: Pulmonary effort is normal.  Neurological:     Mental Status: She is alert.   Review of Systems  Reason unable to perform ROS: Pt unable to speak due to trach.  Psychiatric/Behavioral:  Negative for hallucinations and suicidal ideas. The patient is nervous/anxious.   Blood pressure (!) 141/62, pulse 85, temperature 98.6 F (37 C), temperature source Oral, resp. rate (!) 21, height 5' 6"  (1.676 m), weight 71.9 kg, last menstrual period 03/07/2012, SpO2 99 %. Body mass index is 25.58 kg/m.  Treatment Plan Summary:Patient get irritable and agitated sometimes as patient has been in the hospital for many days.  She wants to go home soon.  Patient refused to make any changes to her medications.  Encouraged to cooperate with staff.  Patient husband visits her support her.   Plan -Patient does not meet criteria for psychiatric inpatient admission. -Patient needs somebody to help her with communication. Recommend encouraging her to write down her thoughts when she feels anxious, depressed or agitated.  -Recommend reorienting patient when gets agitated.  -Maintain regular sleep-wake cycle. -Can consider chaplain consult.  -Patient refused to make any changes to her medications. -Continue  same psych medications. -Psychiatry service will sign off. Disposition: Patient does not meet criteria for psychiatric inpatient admission.  Armando Reichert, MD PGY 2 10/24/2021 1:41 PM

## 2021-10-24 NOTE — Progress Notes (Signed)
Patient ID: [name], female   DOB: Sep 28, 1966, 56 y.o.   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  Patient was subsequently intubated.  Patient apparently was partaking in cocaine with her husband when she began having sudden and severe shortness of breath.  On presentation, she was minimally responsive with chest x-ray showing pulmonary edema and she was hypertensive requiring nitroglycerin infusion.  She has had a long hospitalization with hospital events as below.  She was transferred to hospitalist service from 10/24/2021 or March.  Significant Hospital events 12/23: intubated 12/24: febrile, abx started for CAP 12/25 struggled with sedation - failed ketamine, precedex, dilaudid. Febrile and cultured 12/27 on Dilaudid and Versed drip 12/29 failed weaning with tachypnea 12/31 was extubated , failed extubation within an hour with bronchospasms, stridorous breathing. 01/01 started on empirical antibiotics with vancomycin and cefepime for HCAP 01/02 MRSA screen negative, vancomycin discontinued 01/04 Hypotensive episode, started on Levophed 01/06 extubated to BiPAP, failed 01/07 tracheostomy 01/13 Placed on trach collar  Assessment & Plan:   Acute hypoxic and hypercapnic respiratory failure Community-acquired pneumonia -Patient has had a long hospitalization requiring intubation, extubation, reintubation, failed extubation with subsequent tracheostomy.  Placed on trach collar on 10/21/2021 by PCCM.  Transferred to [facility] service from 10/24/2021 onwards.  Trach care as per PCCM. -Status post 5-day course of Zithromax and Rocephin for CAP followed by 5-day course of cefepime. -Currently off antibiotics.   Currently afebrile for more than 2 days. -Still requiring 8 L oxygen via trach collar. -Continue nebs  Leukocytosis -Improving.  Monitor  Septic shock: Present on admission, resolved  Acute diastolic heart failure -Echo showed EF of 65 to 70% with indeterminate left ventricular diastolic parameters -Strict input and output.  Daily weights.  Fluid restriction.  Positive balance of 4971.7 cc since admission. -will give Lasix 60 mg IV x1 dose today.  ICU delirium History of PTSD History of polysubstance abuse -Patient has had issues of agitation in ICU requiring Precedex infusion along with as needed Haldol.  Precedex has been weaned off. -Currently on various medications including Zyprexa, trazodone, sertraline, Klonopin along with oxycodone.  Will consult psychiatry -Delirium precautions  Diabetes mellitus type 2 uncontrolled -Continue long-acting insulin along with CBGs with SSI  Hypertension Hypertensive emergency: Resolved -Blood pressure currently stable.  Continue lisinopril  Hyperlipidemia -Continue statin  Nutrition -Patient currently is getting feeding through Cortrak tube.  Dietitian following. -Patient is requesting to eat by mouth.  We will follow SLP recommendations.  Anemia of chronic disease -From chronic illnesses.  Monitor  Generalized deconditioning -PT recommends CIR placement.  CIR following.   DVT prophylaxis: Lovenox Code Status: Full Family Communication: Husband at bedside Disposition Plan: Status is: Inpatient  Remains inpatient appropriate because: Of severity of illness  Consultants: PCCM  Procedures: As above  Antimicrobials:  Anti-infectives (From admission, onward)    Start     Dose/Rate Route Frequency Ordered Stop   10/10/21 1000  vancomycin (VANCOREADY) IVPB 750 mg/150 mL  Status:  Discontinued        750 mg 150 mL/hr over 60 Minutes Intravenous Every 12 hours 10/09/21 2218 10/10/21 1517   10/09/21 2315  ceFEPIme (MAXIPIME) 2  g in sodium chloride 0.9 % 100 mL IVPB  2 g 200 mL/hr over 30 Minutes Intravenous Every 8 hours 10/09/21 2218 10/13/21 2246   10/09/21 2315  vancomycin (VANCOREADY) IVPB 1500 mg/300 mL        1,500 mg 150 mL/hr over 120 Minutes Intravenous  Once 10/09/21 2218 10/10/21 0224   10/01/21 1530  azithromycin (ZITHROMAX) 500 mg in sodium chloride 0.9 % 250 mL IVPB        500 mg 250 mL/hr over 60 Minutes Intravenous Every 24 hours 10/01/21 1415 10/05/21 1723   10/01/21 1515  cefTRIAXone (ROCEPHIN) 2 g in sodium chloride 0.9 % 100 mL IVPB  Status:  Discontinued        2 g 200 mL/hr over 30 Minutes Intravenous Every 24 hours 10/01/21 1415 10/08/21 0845        Subjective: Patient seen and examined at bedside.  Husband at bedside.  Patient is requesting to eat by mouth.  Nursing staff reports episode of vomiting this morning.  No overnight fever, chest pain, worsening shortness of breath reported.  Objective: Vitals:   10/24/21 0302 10/24/21 0416 10/24/21 0759 10/24/21 0801  BP:  (!) 132/59  (!) 141/62  Pulse: 87 75 76 74  Resp: (!) 30 20 (!) 22 (!) 23  Temp:  99.1 F (37.3 C)  98.6 F (37 C)  TempSrc:  Oral  Oral  SpO2: 92% 96% 98% 98%  Weight:  71.9 kg    Height:        Intake/Output Summary (Last 24 hours) at 10/24/2021 1037 Last data filed at 10/24/2021 0400 Gross per 24 hour  Intake 1641.11 ml  Output --  Net 1641.11 ml   Filed Weights   10/22/21 0414 10/23/21 0510 10/24/21 0416  Weight: 73 kg 70.3 kg 71.9 kg    Examination:  General exam: Appears chronically ill and deconditioned.  Not in acute distress currently.  Currently on 8 L oxygen via trach collar ENT: Trach collar present.  Cortrak present Respiratory system: Bilateral decreased breath sounds at bases with some scattered crackles Cardiovascular system: S1 & S2 heard, Rate controlled Gastrointestinal system: Abdomen is slightly distended; soft and nontender. Normal bowel sounds heard. Extremities: No  cyanosis, clubbing; trace lower extremity edema present Central nervous system: Awake, follows commands.  No focal neurological deficits. Moving extremities Skin: No obvious ecchymosis/other lesions Psychiatry: Affect is mostly flat.  Intermittently looks anxious.   Data Reviewed: I have personally reviewed following labs and imaging studies  CBC: Recent Labs  Lab 10/19/21 0430 10/21/21 0410 10/22/21 0330 10/22/21 0444 10/23/21 0354 10/24/21 0413  WBC 17.7* 15.6* 14.7*  --  14.9* 13.6*  HGB 8.5* 9.3* 8.8* 8.8* 9.4* 9.6*  HCT 28.1* 29.4* 27.9* 26.0* 28.6* 29.5*  MCV 96.6 93.6 92.4  --  91.7 92.8  PLT 377 335 311  --  311 035   Basic Metabolic Panel: Recent Labs  Lab 10/18/21 0500 10/19/21 0430 10/21/21 0410 10/22/21 0330 10/22/21 0444 10/23/21 0354 10/24/21 0413  NA 145 147* 139 139 141 137 136  K 3.6 4.0 3.9 3.3* 3.6 3.8 4.5  CL 110 112* 108 106  --  104 105  CO2 _0 --  23 26  GLUCOSE 239* 227* 138* 110*  --  162* 212*  BUN 23* 20 21* 19  --  17 14  CREATININE 0.92 0.75 0.88 0.66  --  0.83 0.73  CALCIUM 9.2 9.3 9.0 9.0  --  9.2 9.1  MG 2.1 1.8  --   --   --  1.9  --   PHOS 2.4* 2.9  --   --   --   --   --    GFR: Estimated Creatinine Clearance: 78.8 mL/min (by C-G formula based on SCr of 0.73 mg/dL). Liver Function Tests: No results for input(s): AST, ALT, ALKPHOS, BILITOT, PROT, ALBUMIN in the last 168 hours. No results for input(s): LIPASE, AMYLASE in the last 168 hours. No results for input(s): AMMONIA in the last 168 hours. Coagulation Profile: No results for input(s): INR, PROTIME in the last 168 hours. Cardiac Enzymes: No results for input(s): CKTOTAL, CKMB, CKMBINDEX, TROPONINI in the last 168 hours. BNP (last 3 results) No results for input(s): PROBNP in the last 8760 hours. HbA1C: No results for input(s): HGBA1C in the last 72 hours. CBG: Recent Labs  Lab 10/23/21 1720 10/23/21 2001 10/24/21 0007 10/24/21 0415 10/24/21 0722  GLUCAP  159* 198* 178* 202* 209*   Lipid Profile: No results for input(s): CHOL, HDL, LDLCALC, TRIG, CHOLHDL, LDLDIRECT in the last 72 hours. Thyroid Function Tests: No results for input(s): TSH, T4TOTAL, FREET4, T3FREE, THYROIDAB in the last 72 hours. Anemia Panel: No results for input(s): VITAMINB12, FOLATE, FERRITIN, TIBC, IRON, RETICCTPCT in the last 72 hours. Sepsis Labs: Recent Labs  Lab 10/18/21 0500 10/19/21 0430 10/20/21 0544  PROCALCITON 0.16 <0.10 <0.10    Recent Results (from the past 240 hour(s))  Culture, Respiratory w Gram Stain     Status: None   Collection Time: 10/15/21 11:43 AM   Specimen: Bronchoalveolar Lavage; Respiratory  Result Value Ref Range Status   Specimen Description BRONCHIAL ALVEOLAR LAVAGE  Final   Special Requests NONE  Final   Gram Stain   Final    RARE WBC PRESENT, PREDOMINANTLY MONONUCLEAR NO ORGANISMS SEEN    Culture   Final    RARE Normal respiratory flora-no Staph aureus or Pseudomonas seen Performed at [facility], [address], [place], [place] [address]    Report Status 10/17/2021 FINAL  Final  Culture, blood (routine x 2)     Status: None   Collection Time: 10/18/21 11:43 AM   Specimen: Left Antecubital; Blood  Result Value Ref Range Status   Specimen Description LEFT ANTECUBITAL  Final   Special Requests   Final    BOTTLES DRAWN AEROBIC AND ANAEROBIC Blood Culture adequate volume   Culture   Final    NO GROWTH 5 DAYS Performed at [facility], [place] [address] [place], [place] [address]    Report Status 10/23/2021 FINAL  Final  Culture, blood (routine x 2)     Status: None   Collection Time: 10/18/21 11:45 AM   Specimen: BLOOD LEFT HAND  Result Value Ref Range Status   Specimen Description BLOOD LEFT HAND  Final   Special Requests   Final    BOTTLES DRAWN AEROBIC AND ANAEROBIC Blood Culture adequate volume   Culture   Final    NO GROWTH 5 DAYS Performed at [facility], [place] [address], [place], [place]  [address]    Report Status 10/23/2021 FINAL  Final         Radiology Studies: No results found.      Scheduled Meds:  chlorhexidine gluconate (MEDLINE KIT)  15 mL Mouth Rinse BID   Chlorhexidine Gluconate Cloth  6 each Topical Daily   clonazePAM  0.5 mg Per Tube BID   enoxaparin (LOVENOX) injection  40 mg Subcutaneous Q24H   fiber  1 packet Per Tube BID  insulin aspart  0-20 Units Subcutaneous Q4H   insulin aspart  4 Units Subcutaneous Q4H   insulin glargine-yfgn  15 Units Subcutaneous Daily   lisinopril  20 mg Per Tube Daily   mouth rinse  15 mL Mouth Rinse 10 times per day   OLANZapine  10 mg Per Tube QHS   oxyCODONE  10 mg Per Tube TID   oxyCODONE  10 mg Per Tube QHS   pantoprazole sodium  40 mg Per Tube QHS   rosuvastatin  40 mg Per Tube Daily   sertraline  50 mg Per Tube Daily   sodium chloride flush  10-40 mL Intracatheter Q12H   traZODone  50 mg Per Tube QHS   Continuous Infusions:  sodium chloride 250 mL (10/09/21 2310)   sodium chloride     sodium chloride Stopped (10/23/21 1331)   feeding supplement (VITAL AF 1.2 CAL) 70 mL/hr at 10/23/21 1152          Antoinetta Berrones, MD [facility] 10/24/2021, 10:37 AM

## 2021-10-24 NOTE — Progress Notes (Signed)
Inpatient Rehabilitation Admissions Coordinator   I met at bedside with patient as well as walked with patient in hallway as she mobilized with therapy. I will return to speak to her and her spouse upon his return to room today to discuss her options for rehab venue.  Danne Baxter, RN, MSN Rehab Admissions Coordinator [phone] 10/24/2021 12:40 PM

## 2021-10-24 NOTE — Progress Notes (Signed)
Physical Therapy Treatment Patient Details Name: [name] MRN: [id number] DOB: 1966-03-15 Today's Date: 10/24/2021   History of Present Illness Pt is a 56 y.o. female admitted 09/30/21 after becoming minimally responsive after doing cocaine with husband, required emergent intubation 12/23. ETT 12/23-12/31, reintubated 12/31-1/6, failed again; s/p trach 1/7. PMH includes asthma, DM2, HLD, PTSD, anxiety, chronic pancreatitis.    PT Comments    Pt very eager and motivated to work with PT today, so much so she was very impulsive. Pt continues with decreased insight to deficits and safety awareness. Pt did begin gait training today for the first time during her stay. Pt required max tactile and verbal cues from posterior hips to maintain upright posture and 2nd person to manage lines and chair follow. Pt with impaired sequencing, spatial awareness, and processing. Pt also engaged in pushing self in recliner backwards using LEs only for strengthening purposes. Pt demonstrates excellent rehab potential and would continue to greatly benefit from AIR upon d/c. Acute PT to cont to follow.   Recommendations for follow up therapy are one component of a multi-disciplinary discharge planning process, led by the attending physician.  Recommendations may be updated based on patient status, additional functional criteria and insurance authorization.  Follow Up Recommendations  Acute inpatient rehab (3hours/day)     Assistance Recommended at Discharge Frequent or constant Supervision/Assistance  Patient can return home with the following Two people to help with walking and/or transfers;Help with stairs or ramp for entrance;Other (comment)   Equipment Recommendations  Wheelchair (measurements PT);Wheelchair cushion (measurements PT);Rolling walker (2 wheels)    Recommendations for Other Services Rehab consult     Precautions / Restrictions Precautions Precautions: Fall;Other (comment) Precaution  Comments: trach, coretrak Restrictions Weight Bearing Restrictions: No     Mobility  Bed Mobility               General bed mobility comments: pt up in chair upon PT arrival    Transfers Overall transfer level: Needs assistance Equipment used: Rolling walker (2 wheels) Transfers: Sit to/from Stand Sit to Stand: Mod assist;+2 safety/equipment           General transfer comment: max directional verbal cues, modA to power up and keep body weight forward due to posterior bias and hyperextension of LE, 2nd person for line management    Ambulation/Gait Ambulation/Gait assistance: Max assist;+2 safety/equipment Gait Distance (Feet): 50 Feet (x1, 60x1) Assistive device: Rolling walker (2 wheels) Gait Pattern/deviations: Step-through pattern;Decreased stride length;Narrow base of support Gait velocity: impulsively fast but not controlled     General Gait Details: pt with bilat LE in external rotation and narrow base of support near hitting opposite heal during step through, constant verbal cues to keep feet further apart however unable to maintain. Pt with noted R LE weaker than L, especially in hip requiring tactile cues from the posterior to prevent trendelenburg and R knee hyperextension, pt continues with posterior bias, tech to aide in walker management and line management, 3rd person to push chair, pt constantly vearing to the L   Stairs             Wheelchair Mobility    Modified Rankin (Stroke Patients Only)       Balance Overall balance assessment: Needs assistance         Standing balance support: During functional activity Standing balance-Leahy Scale: Poor Standing balance comment: R lateral lean and posterior bias, modA  Cognition Arousal/Alertness: Awake/alert Behavior During Therapy: Impulsive Overall Cognitive Status: Impaired/Different from baseline Area of Impairment:  Orientation;Attention;Memory;Following commands;Awareness;Problem solving                   Current Attention Level: Focused Memory: Decreased short-term memory Following Commands: Follows one step commands with increased time;Follows multi-step commands inconsistently   Awareness: Emergent Problem Solving: Requires verbal cues;Requires tactile cues General Comments: pt eager to move and walk but significantly impaired safetly awarenes and decreased insight to deficits Functional Status Assessment: Patient has had a recent decline in their functional status and demonstrates the ability to make significant improvements in function in a reasonable and predictable amount of time.      Exercises      General Comments General comments (skin integrity, edema, etc.): VSS on trach collar      Pertinent Vitals/Pain Pain Assessment: No/denies pain    Home Living                          Prior Function            PT Goals (current goals can now be found in the care plan section) Acute Rehab PT Goals PT Goal Formulation: Patient unable to participate in goal setting Time For Goal Achievement: 10/31/21 Potential to Achieve Goals: Good Progress towards PT goals: Progressing toward goals    Frequency    Min 4X/week      PT Plan Frequency needs to be updated    Co-evaluation              AM-PAC PT "6 Clicks" Mobility   Outcome Measure  Help needed turning from your back to your side while in a flat bed without using bedrails?: A Little Help needed moving from lying on your back to sitting on the side of a flat bed without using bedrails?: A Little Help needed moving to and from a bed to a chair (including a wheelchair)?: A Lot Help needed standing up from a chair using your arms (e.g., wheelchair or bedside chair)?: A Lot Help needed to walk in hospital room?: A Lot Help needed climbing 3-5 steps with a railing? : Total 6 Click Score: 13    End of  Session Equipment Utilized During Treatment: Oxygen Activity Tolerance: Patient tolerated treatment well Patient left: with call bell/phone within reach;in chair;with chair alarm set;with nursing/sitter in room Nurse Communication: Mobility status PT Visit Diagnosis: Other abnormalities of gait and mobility (R26.89);Muscle weakness (generalized) (M62.81)     Time: 8144-8185 PT Time Calculation (min) (ACUTE ONLY): 36 min  Charges:  $Gait Training: 23-37 mins                     Kittie Plater, PT, DPT Acute Rehabilitation Services Pager #: [phone] Office #: [phone]    Berline Lopes 10/24/2021, 2:35 PM

## 2021-10-24 NOTE — Procedures (Signed)
Tracheostomy Change Note  Patient Details:   Name: [name] DOB: 25-May-1966 MRN: [id number]    Airway Documentation:     Evaluation  O2 sats: stable throughout Complications: No apparent complications Patient did tolerate procedure well. Bilateral Breath Sounds: Diminished, Rhonchi   Pts trach downsized to a #4 shiley flex cuffless. No complications noted, positive color change noted on ETCO2. RT will continue to monitor.    Tobi Bastos 10/24/2021, 2:18 PM

## 2021-10-24 NOTE — Care Management Important Message (Signed)
Important Message  Patient Details  Name: [name] MRN: [id number] Date of Birth: [birth year]   Medicare Important Message Given:  Yes     Memory Argue 10/24/2021, 2:46 PM

## 2021-10-24 NOTE — Progress Notes (Addendum)
° °  Inpatient Rehabilitation Admissions Coordinator   I met at bedside with patient , her spouse and daughter from [place]. We dicussed goals and expectations of a possible CIR admit. All are in agreement. I have asked acute therapy for updated OT assessment Tuesday so that I can begin insurance Auth with [facility] for a possible Cir admit.  Danne Baxter, RN, MSN Rehab Admissions Coordinator [phone] 10/24/2021 4:00 PM

## 2021-10-24 NOTE — Progress Notes (Signed)
Patient and family refuse to continue tube feeding, patient/family disconnect the feeding from the patient. MD notified, Dr. Starla Link ,Kshitiz okay to disconnect tube feeding. Will continue to monitor.

## 2021-10-24 NOTE — TOC Progression Note (Addendum)
Transition of Care [facility]) - Progression Note    Patient Details  Name: [name] MRN: [id number] Date of Birth: December 21, 1965  [facility] of [facility]) CM/SW Contact  Zenon Mayo, RN Phone Number: 10/24/2021, 8:11 PM  Clinical Narrative:    Patient is from home with spouse, resp failure,she has new trach,which was downsized to 4 cuffless today, the cortrak was taken out today as well. Speech is following, she was not able to tolerate PMV.  PCCM will try to decanulate . CIR is following and will start auth for CIR on Tuesday.  TOC will continue to follow for dc needs.    Expected Discharge Plan: Long Term Acute Care (LTAC) Barriers to Discharge: Continued Medical Work up  Expected Discharge Plan and Services Expected Discharge Plan: Long Term Acute Care (LTAC) In-house Referral: Clinical Social Work Discharge Planning Services: CM Consult Post Acute Care Choice: NA Living arrangements for the past 2 months: Single Family Home                 DME Arranged: N/A DME Agency: NA       HH Arranged: NA           Social Determinants of Health (SDOH) Interventions    Readmission Risk Interventions Readmission Risk Prevention Plan 10/20/2021  Transportation Screening Complete  Medication Review [employer]) Referral to Pharmacy  PCP or Specialist appointment within 3-5 days of discharge (No Data)  [place] or [place] Complete  SW Recovery Care/Counseling Consult Complete  [place] Patient Refused  Some recent data might be hidden

## 2021-10-24 NOTE — Evaluation (Signed)
Clinical/Bedside Swallow Evaluation Patient Details  Name: [name] MRN: [id number] Date of Birth: 1966/08/12  Today's Date: 10/24/2021 Time: SLP Start Time (ACUTE ONLY): 65 SLP Stop Time (ACUTE ONLY): 1448 SLP Time Calculation (min) (ACUTE ONLY): 12 min  Past Medical History:  Past Medical History:  Diagnosis Date   Asthma    Diabetes mellitus without complication (HCC)    GERD (gastroesophageal reflux disease)    Heart murmur    from childhood rheumatic fever, per patient   Hyperlipidemia    Pneumonia 12/14   Wears partial dentures    top and bottom partials   Past Surgical History:  Past Surgical History:  Procedure Laterality Date   ANTERIOR LAT LUMBAR FUSION Left 07/31/2013   Procedure: ANTERIOR LATERAL LUMBAR FUSION 1 LEVEL/Left sided lumbar 3-4 lateral interbody fusion with instrumentation and allograft.;  Surgeon: Sinclair Ship, MD;  Location: [place];  Service: Orthopedics;  Laterality: Left;  Left sided lumbar 3-4 lateral interbody fusion with instrumentation and allograft.   APPENDECTOMY  30- 40 years   carpal tunnel     right arm   CARPAL TUNNEL RELEASE Right 12/15/2013   Procedure: RIGHT CARPAL TUNNEL RELEASE ENDOSCOPIC;  Surgeon: Jolyn Nap, MD;  Location: [facility];  Service: Orthopedics;  Laterality: Right;   LATERAL EPICONDYLE RELEASE Right 12/15/2013   Procedure: RIGHT ULNAR NEUROPLASTY AT ELBOW ;  Surgeon: Jolyn Nap, MD;  Location: [place];  Service: Orthopedics;  Laterality: Right;   LUMBAR FUSION     HPI:  Pt is a 56 y.o. female admitted 09/30/21 after becoming minimally responsive after doing cocaine with husband, required emergent intubation 12/23. ETT 12/23-12/31, reintubated 12/31-1/6, failed again; s/p trach 1/7. PMH includes asthma, DM2, HLD, PTSD, anxiety, chronic pancreatitis.    Assessment / Plan / Recommendation  Clinical Impression  Pt was seen for clinical swallow assessment.  PMV placed and  pt demonstrated poor toleration of valve with air trapping consistently observed upon its removal.  Pt reported "I can't breathe." She sat upright in chair with the anticipation that a change in position would help with access to upper airway, but unfortunately she still was unable to generate airflow and achieve voice.  Plan is for downsize to a #4 trach later today.  Hopefully this will help; however, she tolerated the valve very well on 1/13 under similar conditions.   Despite inability to use valve, we proceeded with clinical swallow eval. She demonstrated good oral manipulation, palpable swallow response, and delayed coughing after 5-6 ice chips.  She is ready for instrumental study. Recommend proceeding with FEES next date. Allow only occasional ice chips this evening (4-5/hour) - d/w pt, who agrees with plan.  Please do NOT place PMV given intolerance.   SLP Visit Diagnosis: Dysphagia, unspecified (R13.10)    Aspiration Risk     tba  Diet Recommendation    NPO except occasional ice chips      Other  Recommendations Oral Care Recommendations: Oral care QID    Recommendations for follow up therapy are one component of a multi-disciplinary discharge planning process, led by the attending physician.  Recommendations may be updated based on patient status, additional functional criteria and insurance authorization.  Follow up Recommendations Acute inpatient rehab (3hours/day)      Assistance Recommended at Discharge    Functional Status Assessment Patient has had a recent decline in their functional status and demonstrates the ability to make significant improvements in function in a reasonable and predictable  amount of time.  Frequency and Duration min 3x week  2 weeks       Prognosis        Swallow Study   General Date of Onset: 09/30/21 HPI: Pt is a 56 y.o. female admitted 09/30/21 after becoming minimally responsive after doing cocaine with husband, required emergent  intubation 12/23. ETT 12/23-12/31, reintubated 12/31-1/6, failed again; s/p trach 1/7. PMH includes asthma, DM2, HLD, PTSD, anxiety, chronic pancreatitis. Type of Study: Bedside Swallow Evaluation Previous Swallow Assessment: no Diet Prior to this Study: NPO Temperature Spikes Noted: No Respiratory Status: Trach;Trach Collar Trach Size and Type: #6;Deflated History of Recent Intubation: Yes Length of Intubations (days):  (14 days over two intubations) Behavior/Cognition: Alert;Confused Oral Cavity Assessment: Within Functional Limits Oral Care Completed by SLP: No Vision: Functional for self-feeding Self-Feeding Abilities: Able to feed self Patient Positioning: Upright in chair Baseline Vocal Quality: Hoarse;Low vocal intensity Volitional Cough: Strong Volitional Swallow: Able to elicit    Oral/Motor/Sensory Function Overall Oral Motor/Sensory Function: Within functional limits   Ice Chips Ice chips: Impaired Presentation: Spoon Pharyngeal Phase Impairments: Cough - Delayed   Thin Liquid Thin Liquid: Not tested    Nectar Thick Nectar Thick Liquid: Not tested   Honey Thick Honey Thick Liquid: Not tested   Puree Puree: Not tested   Solid     Solid: Not tested      [name] 10/24/2021,2:06 PM  Estill Bamberg L. Tivis Ringer, [place] Office number [phone] Pager [phone]

## 2021-10-25 ENCOUNTER — Inpatient Hospital Stay (HOSPITAL_COMMUNITY): Payer: Medicare HMO

## 2021-10-25 LAB — GLUCOSE, CAPILLARY
Glucose-Capillary: 142 mg/dL — ABNORMAL HIGH (ref 70–99)
Glucose-Capillary: 155 mg/dL — ABNORMAL HIGH (ref 70–99)
Glucose-Capillary: 166 mg/dL — ABNORMAL HIGH (ref 70–99)
Glucose-Capillary: 168 mg/dL — ABNORMAL HIGH (ref 70–99)
Glucose-Capillary: 182 mg/dL — ABNORMAL HIGH (ref 70–99)
Glucose-Capillary: 230 mg/dL — ABNORMAL HIGH (ref 70–99)

## 2021-10-25 LAB — BASIC METABOLIC PANEL
Anion gap: 10 (ref 5–15)
BUN: 14 mg/dL (ref 6–20)
CO2: 30 mmol/L (ref 22–32)
Calcium: 9.7 mg/dL (ref 8.9–10.3)
Chloride: 96 mmol/L — ABNORMAL LOW (ref 98–111)
Creatinine, Ser: 0.83 mg/dL (ref 0.44–1.00)
GFR, Estimated: >60 mL/min (ref >60)
Glucose, Bld: 230 mg/dL — ABNORMAL HIGH (ref 70–99)
Potassium: 3.7 mmol/L (ref 3.5–5.1)
Sodium: 136 mmol/L (ref 135–145)

## 2021-10-25 LAB — MAGNESIUM: Magnesium: 2 mg/dL (ref 1.7–2.4)

## 2021-10-25 MED ORDER — OXYCODONE HCL 5 MG PO TABS
10.0000 mg | ORAL_TABLET | Freq: Three times a day (TID) | ORAL | Status: DC
Start: 2021-10-25 — End: 2021-10-26
  Administered 2021-10-25 – 2021-10-26 (×2): 10 mg via ORAL
  Filled 2021-10-25 (×2): qty 2

## 2021-10-25 MED ORDER — INSULIN ASPART 100 UNIT/ML IJ SOLN: 0.0000 [IU] | Freq: Every day | INTRAMUSCULAR | Status: DC

## 2021-10-25 MED ORDER — PANTOPRAZOLE SODIUM 40 MG PO TBEC
40.0000 mg | DELAYED_RELEASE_TABLET | Freq: Every day | ORAL | Status: DC
  Administered 2021-10-25: 40 mg via ORAL
  Filled 2021-10-25: qty 1

## 2021-10-25 MED ORDER — TRAZODONE HCL 50 MG PO TABS
50.0000 mg | ORAL_TABLET | Freq: Every day | ORAL | Status: DC
  Administered 2021-10-25: 50 mg via ORAL
  Filled 2021-10-25 (×2): qty 1

## 2021-10-25 MED ORDER — NUTRISOURCE FIBER PO PACK
1.0000 | PACK | Freq: Two times a day (BID) | ORAL | Status: DC
  Administered 2021-10-26: 1 via ORAL
  Filled 2021-10-25 (×2): qty 1

## 2021-10-25 MED ORDER — POLYETHYLENE GLYCOL 3350 17 G PO PACK: 17.0000 g | PACK | Freq: Every day | ORAL | Status: DC | PRN

## 2021-10-25 MED ORDER — CLONAZEPAM 0.5 MG PO TABS
0.5000 mg | ORAL_TABLET | Freq: Two times a day (BID) | ORAL | Status: DC
  Administered 2021-10-25 – 2021-10-26 (×2): 0.5 mg via ORAL
  Filled 2021-10-25 (×2): qty 1

## 2021-10-25 MED ORDER — ENSURE ENLIVE PO LIQD
237.0000 mL | Freq: Two times a day (BID) | ORAL | Status: DC
  Administered 2021-10-26: 237 mL via ORAL

## 2021-10-25 MED ORDER — FUROSEMIDE 10 MG/ML IJ SOLN
60.0000 mg | Freq: Once | INTRAMUSCULAR | Status: AC
  Administered 2021-10-25: 60 mg via INTRAVENOUS
  Filled 2021-10-25: qty 6

## 2021-10-25 MED ORDER — ONDANSETRON HCL 4 MG/2ML IJ SOLN
4.0000 mg | Freq: Four times a day (QID) | INTRAMUSCULAR | Status: DC
  Administered 2021-10-25 – 2021-10-26 (×4): 4 mg via INTRAVENOUS
  Filled 2021-10-25 (×4): qty 2

## 2021-10-25 MED ORDER — LISINOPRIL 20 MG PO TABS
20.0000 mg | ORAL_TABLET | Freq: Every day | ORAL | Status: DC
  Administered 2021-10-26: 20 mg via ORAL
  Filled 2021-10-25: qty 1

## 2021-10-25 MED ORDER — ACETAMINOPHEN 325 MG PO TABS: 650.0000 mg | ORAL_TABLET | ORAL | Status: DC | PRN

## 2021-10-25 MED ORDER — OLANZAPINE 5 MG PO TABS
10.0000 mg | ORAL_TABLET | Freq: Every day | ORAL | Status: DC
  Administered 2021-10-25: 10 mg via ORAL
  Filled 2021-10-25: qty 2

## 2021-10-25 MED ORDER — INSULIN ASPART 100 UNIT/ML IJ SOLN
0.0000 [IU] | Freq: Three times a day (TID) | INTRAMUSCULAR | Status: DC
  Administered 2021-10-25 – 2021-10-26 (×2): 3 [IU] via SUBCUTANEOUS

## 2021-10-25 MED ORDER — DOCUSATE SODIUM 100 MG PO CAPS: 100.0000 mg | ORAL_CAPSULE | Freq: Every day | ORAL | Status: DC | PRN

## 2021-10-25 MED ORDER — ROSUVASTATIN CALCIUM 20 MG PO TABS
40.0000 mg | ORAL_TABLET | Freq: Every day | ORAL | Status: DC
  Administered 2021-10-26: 40 mg via ORAL
  Filled 2021-10-25: qty 2

## 2021-10-25 MED ORDER — SERTRALINE HCL 50 MG PO TABS
50.0000 mg | ORAL_TABLET | Freq: Every day | ORAL | Status: DC
  Administered 2021-10-26: 50 mg via ORAL
  Filled 2021-10-25: qty 1

## 2021-10-25 MED ORDER — OXYCODONE HCL 5 MG PO TABS
10.0000 mg | ORAL_TABLET | Freq: Every day | ORAL | Status: DC
  Administered 2021-10-25: 10 mg via ORAL
  Filled 2021-10-25: qty 2

## 2021-10-25 NOTE — Care Management Important Message (Signed)
Important Message  Patient Details  Name: [name] MRN: [id number] Date of Birth: 1966/09/12   Medicare Important Message Given:  Yes     Shelda Altes 10/25/2021, 8:01 AM

## 2021-10-25 NOTE — Progress Notes (Signed)
Pt with large amounts of emesis each time she is deep suctioned. Pt requires deep suctioning qshift and PRN.

## 2021-10-25 NOTE — Plan of Care (Signed)
  Problem: Clinical Measurements: Goal: Respiratory complications will improve Outcome: Progressing   Problem: Activity: Goal: Risk for activity intolerance will decrease Outcome: Progressing   Problem: Nutrition: Goal: Adequate nutrition will be maintained Outcome: Progressing   

## 2021-10-25 NOTE — Progress Notes (Addendum)
° °  NAME:  [name], MRN:  [id number], DOB:  1966/08/11, LOS: 74 ADMISSION DATE:  09/30/2021, CONSULTATION DATE: 09/30/2021 REFERRING MD: ED provider, CHIEF COMPLAINT: Acute hypoxic respiratory failure  History of Present Illness:  56 yo black female with pmh asthma, dm2, hyperlipidemia, PTSD/anxiety and chronic pancreatitis presented via EMS on cpap machine with sats in the mid 60's. Pt is intubated and sedated so ROS and complete history are unobtainable at this time. All history is obtained from ED and chart review.    Per report pt was at home partaking in cocaine with her husband when she began having sudden and severe sob. She was altered, unable to hold herself up in bed, minimally responsive. She had minimal air movement while on cpap and decision was made to emergently intubate pt. CXR revealed pulmonary edema. BP was noted to be extremely elevated >270 systolic. She was started on nitro infusion.    CCM was consulted for admission 2/2 pt's intubated status.   Pertinent  Medical History  DM2 Asthma Hyperlipidemia PTSD, anxiety Chronic pancreatitis  Significant Hospital Events: Including procedures, antibiotic start and stop dates in addition to other pertinent events   12/23: intubated 12/24: febrile, abx started for CAP 12/25 struggled with sedation - failed ketamine, precedex, dilaudid. Febrile and cultured 12/27 on Dilaudid and Versed drip 12/29 failed weaning this morning with tachypnea 12/31 was extubated 1230, failed extubation within an hour with bronchospasms, stridorous breathing. 01/01 started on empirical antibiotics with vancomycin and cefepime for HCAP 01/02 MRSA screen negative, vancomycin discontinued 01/04 Hypotensive episode, started on Levophed 01/06 extubated to BiPAP, failed 01/07 tracheostomy 01/13 Placed on trach collar  Interim History / Subjective:  Still wants to go home. Tolerating 4-0 cuffless shiley,  Objective   Blood pressure 110/72,  pulse 83, temperature 98.7 F (37.1 C), temperature source Oral, resp. rate 18, height 5\' 6"  (1.676 m), weight 72.4 kg, last menstrual period 03/07/2012, SpO2 94 %.    FiO2 (%):  [28 %-35 %] 28 %   Intake/Output Summary (Last 24 hours) at 10/25/2021 0827 Last data filed at 10/25/2021 0500 Gross per 24 hour  Intake 730 ml  Output 1400 ml  Net -670 ml    Filed Weights   10/23/21 0510 10/24/21 0416 10/25/21 0507  Weight: 70.3 kg 71.9 kg 72.4 kg    Examination: No distress Minimal secretions and strong cough Following commands x 4 Ext without edema Anxious  Resolved Hospital Problem list   Septic shock AKI Hypertensive crisis 2/2 cocaine use Hypotension  Assessment & Plan:  Acute hypoxemic hypercarbic respiratory failure Community-acquired pneumonia Acute pulmonary edema Leukocytosis Diastolic heart failure ICU delirium History of polysubstance abuse  PTSD T2DM, uncontrolled (A1c 9.1) HTN  Lingering issue with vent dependence was mostly related to mental status which seems improved.  I think we can work toward decannulation  - Cap trach today - If doing well tomorrow am will decannulate  Remainder of care per primary  Erskine Emery MD PCCM

## 2021-10-25 NOTE — Progress Notes (Signed)
Physical Therapy Treatment Patient Details Name: [name] MRN: [id number] DOB: [birth year] Today's Date: 10/25/2021   History of Present Illness Pt is a 56 y.o. female admitted 09/30/21 after becoming minimally responsive after doing cocaine with husband, required emergent intubation 12/23. ETT 12/23-12/31, reintubated 12/31-1/6, failed again; s/p trach 1/7. PMH includes asthma, DM2, HLD, PTSD, anxiety, chronic pancreatitis.    PT Comments    Pt continues to make steady progress towards all goals. Pt extremely motivated to return to PLOF and eager to get to AIR. Pt continues to demo impaired balance, increased falls risk, decreased insight to deficits and safety, R lateral lean and weakness. Pt with improved ambulation tolerance and gait pattern compared to previous session. Pt to continue to benefit from AIR upon d/c to address both cognitive and functional deficits. Acute PT to cont to follow.    Recommendations for follow up therapy are one component of a multi-disciplinary discharge planning process, led by the attending physician.  Recommendations may be updated based on patient status, additional functional criteria and insurance authorization.  Follow Up Recommendations  Acute inpatient rehab (3hours/day)     Assistance Recommended at Discharge Frequent or constant Supervision/Assistance  Patient can return home with the following Two people to help with walking and/or transfers;Help with stairs or ramp for entrance;Other (comment)   Equipment Recommendations  Rolling walker (2 wheels)    Recommendations for Other Services Rehab consult     Precautions / Restrictions Precautions Precautions: Fall;Other (comment) Precaution Comments: trach, coretrak Restrictions Weight Bearing Restrictions: No     Mobility  Bed Mobility               General bed mobility comments: pt sitting EOB upon PT arrival    Transfers Overall transfer level: Needs assistance Equipment  used: Rolling walker (2 wheels) Transfers: Sit to/from Stand Sit to Stand: Mod assist           General transfer comment: max diretional verbal cues for safe hand placement, pt with difficulty pushing up from bed, continuous reaching up to pull up on RW despite max verbal and tactile cues, modA due to posterior bias and leaning on bed with LEs    Ambulation/Gait Ambulation/Gait assistance: Mod assist, +2 safety/equipment Gait Distance (Feet): 100 Feet (x1, 60x1) Assistive device: Rolling walker (2 wheels), None Gait Pattern/deviations: Step-through pattern, Decreased stride length, Narrow base of support Gait velocity: more guarded and contolled today compared to yesterday Gait velocity interpretation: <1.31 ft/sec, indicative of household ambulator   General Gait Details: pt continues with narrow base of support and bilat LE externally rotated, pt with improved R LE strength, less knee buckling, noted with R knee buckling with onset of fatigue at about 74', pt continues to bump into things on the L requiring verbal cues to correct, pt with noted impaired spatial awareness. Trialed ambulation with L HHA x 60', pt with noted frequent R Knee buckling requiring modA to prevent fall, pt also with LOB with turning both to L and R with and without RW   Stairs             Wheelchair Mobility    Modified Rankin (Stroke Patients Only)       Balance Overall balance assessment: Needs assistance Sitting-balance support: Bilateral upper extremity supported, Single extremity supported, Feet supported Sitting balance-Leahy Scale: Fair     Standing balance support: During functional activity Standing balance-Leahy Scale: Poor Standing balance comment: dependent on external support  Cognition Arousal/Alertness: Awake/alert Behavior During Therapy: Impulsive Overall Cognitive Status: Impaired/Different from baseline Area of Impairment:  Safety/judgement, Problem solving, Awareness, Memory                   Current Attention Level: Sustained Memory: Decreased short-term memory Following Commands: Follows multi-step commands inconsistently, Follows one step commands with increased time Safety/Judgement: Decreased awareness of safety, Decreased awareness of deficits Awareness: Emergent Problem Solving: Requires verbal cues, Requires tactile cues General Comments: pt remains quick to move and with decreased insight to safety, pt remains motivated to improve, pt able to say todays date        Exercises      General Comments General comments (skin integrity, edema, etc.): SpO2 at 91-93% on 6LO2 via trach collar      Pertinent Vitals/Pain Pain Assessment Pain Assessment: Faces Faces Pain Scale: Hurts little more Pain Location: pt reports some back pain at sight of old surgery Pain Descriptors / Indicators: Discomfort Pain Intervention(s): Monitored during session    Home Living                          Prior Function            PT Goals (current goals can now be found in the care plan section) Progress towards PT goals: Progressing toward goals    Frequency    Min 4X/week      PT Plan Current plan remains appropriate    Co-evaluation              AM-PAC PT "6 Clicks" Mobility   Outcome Measure  Help needed turning from your back to your side while in a flat bed without using bedrails?: A Little Help needed moving from lying on your back to sitting on the side of a flat bed without using bedrails?: A Little Help needed moving to and from a bed to a chair (including a wheelchair)?: A Lot Help needed standing up from a chair using your arms (e.g., wheelchair or bedside chair)?: A Lot Help needed to walk in hospital room?: A Lot Help needed climbing 3-5 steps with a railing? : A Lot 6 Click Score: 14    End of Session Equipment Utilized During Treatment: Oxygen Activity  Tolerance: Patient tolerated treatment well Patient left: in chair;with chair alarm set;with call bell/phone within reach Nurse Communication: Mobility status PT Visit Diagnosis: Other abnormalities of gait and mobility (R26.89);Muscle weakness (generalized) (M62.81)     Time: 1610-9604 PT Time Calculation (min) (ACUTE ONLY): 25 min  Charges:  $Gait Training: 23-37 mins                     Kittie Plater, PT, DPT Acute Rehabilitation Services Pager #: [phone] Office #: [phone]    Berline Lopes 10/25/2021, 10:21 AM

## 2021-10-25 NOTE — Progress Notes (Signed)
Mobility Specialist Progress Note:   10/25/21 1100  Mobility  Activity Ambulated with assistance in room  Level of Assistance Minimal assist, patient does 75% or more  Assistive Device  (HHA)  Distance Ambulated (ft) 20 ft  Activity Response Tolerated well  $Mobility charge 1 Mobility   Pt received walking in room. Assisted back to bed and instructed to call when wanting to get up. Left in bed with call bell in reach and all needs met.   [facility] Public librarian Phone [phone] Secondary Phone [phone]

## 2021-10-25 NOTE — Progress Notes (Signed)
Patient ID: [name], female   DOB: 02-28-1966, 56 y.o.   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  Patient was subsequently intubated.  Patient apparently was partaking in cocaine with her husband when she began having sudden and severe shortness of breath.  On presentation, she was minimally responsive with chest x-ray showing pulmonary edema and she was hypertensive requiring nitroglycerin infusion.  She has had a long hospitalization with hospital events as below.  She was transferred to hospitalist service from 10/24/2021 onwards.  Significant Hospital events 12/23: intubated 12/24: febrile, abx started for CAP 12/25 struggled with sedation - failed ketamine, precedex, dilaudid. Febrile and cultured 12/27 on Dilaudid and Versed drip 12/29 failed weaning with tachypnea 12/31 was extubated , failed extubation within an hour with bronchospasms, stridorous breathing. 01/01 started on empirical antibiotics with vancomycin and cefepime for HCAP 01/02 MRSA screen negative, vancomycin discontinued 01/04 Hypotensive episode, started on Levophed 01/06 extubated to BiPAP, failed 01/07 tracheostomy 01/13 Placed on trach collar  Assessment & Plan:   Acute hypoxic and hypercapnic respiratory failure Community-acquired pneumonia -Patient has had a long hospitalization requiring intubation, extubation, reintubation, failed extubation with subsequent tracheostomy.  Placed on trach collar on 10/21/2021 by PCCM.  Transferred to [facility] service from 10/24/2021 onwards.  Trach care as per PCCM. -Status post 5-day course of Zithromax and Rocephin for CAP followed by 5-day course of cefepime. -Currently off antibiotics.   Currently afebrile for the last few days. -Still requiring 8 L oxygen via trach collar. -Continue nebs  Leukocytosis -Improving.  Labs pending for today.  Monitor  Septic shock: Present on admission, resolved  Acute diastolic heart failure -Echo showed EF of 65 to 70% with indeterminate left ventricular diastolic parameters -Strict input and output.  Daily weights.  Fluid restriction.  Positive balance of 3122.2 cc since admission. -Patient received 1 dose of IV Lasix 60 mg on 10/24/2021.  Will give 1 more dose of Lasix 60 mg IV today.  ICU delirium History of PTSD History of polysubstance abuse -Patient has had issues of agitation in ICU requiring Precedex infusion along with as needed Haldol.  Precedex has been weaned off. -Currently on various medications including Zyprexa, trazodone, sertraline, Klonopin along with oxycodone.  Psychiatry was consulted on 10/24/2021 and signed off on 10/24/2021. -Delirium precautions  Diabetes mellitus type 2 uncontrolled -Continue long-acting insulin along with CBGs with SSI  Hypertension Hypertensive emergency: Resolved -Blood pressure currently stable.  Continue lisinopril  Hyperlipidemia -Continue statin  Nutrition -SLP following.  Currently NPO. -Tube feeding discontinued on 10/24/2021 evening as patient refused to continue because of vomiting. -Might have to resume tube feeding at a lower rate if patient agrees  Vomiting -Questionable cause.  We will treat with round-the-clock Zofran for today.  Continue Protonix  Anemia of chronic disease -From chronic illnesses.  Monitor  Generalized deconditioning -PT recommends CIR placement.  CIR following.   DVT prophylaxis: Lovenox Code Status: Full Family Communication: Husband at bedside on 10/24/2021 Disposition Plan: Status is: Inpatient  Remains inpatient appropriate because: Of severity of illness  Consultants: PCCM  Procedures: As above  Antimicrobials:  Anti-infectives  (From admission, onward)    Start     Dose/Rate Route Frequency Ordered Stop   10/10/21 1000  vancomycin (VANCOREADY) IVPB 750 mg/150 mL  Status:  Discontinued        750 mg 150 mL/hr over 60 Minutes Intravenous Every 12 hours 10/09/21 2218 10/10/21 1517   10/09/21 2315  ceFEPIme (MAXIPIME) 2 g in sodium chloride 0.9 % 100 mL IVPB        2 g 200 mL/hr over 30 Minutes Intravenous Every 8 hours 10/09/21 2218 10/13/21 2246   10/09/21 2315  vancomycin (VANCOREADY) IVPB 1500 mg/300 mL        1,500 mg 150 mL/hr over 120 Minutes Intravenous  Once 10/09/21 2218 10/10/21 0224   10/01/21 1530  azithromycin (ZITHROMAX) 500 mg in sodium chloride 0.9 % 250 mL IVPB        500 mg 250 mL/hr over 60 Minutes Intravenous Every 24 hours 10/01/21 1415 10/05/21 1723   10/01/21 1515  cefTRIAXone (ROCEPHIN) 2 g in sodium chloride 0.9 % 100 mL IVPB  Status:  Discontinued        2 g 200 mL/hr over 30 Minutes Intravenous Every 24 hours 10/01/21 1415 10/08/21 0845        Subjective: Patient seen and examined at bedside. Poor historian.  Nods head to some questions.  Still requesting to eat by mouth.  As per nursing staff, patient refused tube feeding yesterday evening because of episodes of vomiting.  No overnight fever, chest pain, worsening shortness of breath reported Objective: Vitals:   10/25/21 0015 10/25/21 0300 10/25/21 0500 10/25/21 0507  BP: (!) 144/66   126/78  Pulse: 88   86  Resp: 18   18  Temp: 98.2 F (36.8 C)   97.8 F (36.6 C)  TempSrc:    Oral  SpO2: 96% 96% 94% 96%  Weight:    72.4 kg  Height:        Intake/Output Summary (Last 24 hours) at 10/25/2021 0714 Last data filed at 10/25/2021 0500 Gross per 24 hour  Intake 730 ml  Output 1400 ml  Net -670 ml    Filed Weights   10/23/21 0510 10/24/21 0416 10/25/21 0507  Weight: 70.3 kg 71.9 kg 72.4 kg    Examination:  General exam: Appears chronically ill and deconditioned.  Still on 8 L oxygen via trach collar.  No  distress. ENT: Still has trach collar.  Cortrak present; tube feeding currently on hold Respiratory system: Decreased breath sounds at bases bilaterally with some crackles  cardiovascular system: Rate controlled currently; S1-S2 heard Gastrointestinal system: Abdomen is distended slightly; soft and nontender.  Bowel sounds are heard.   Extremities: Mild lower extremity edema present; no clubbing  Central nervous system: Follows commands; awake.  Slow to respond.  No focal neurological deficits.  Moves extremities  skin: No obvious petechiae/rashes Psychiatry: Gets anxious intermittently otherwise has flat affect  Data Reviewed: I have personally reviewed following labs and imaging studies  CBC: Recent Labs  Lab 10/19/21 0430 10/21/21 0410 10/22/21 0330 10/22/21 0444 10/23/21 0354 10/24/21 0413  WBC 17.7* 15.6* 14.7*  --  14.9* 13.6*  HGB 8.5* 9.3* 8.8* 8.8* 9.4* 9.6*  HCT 28.1* 29.4* 27.9* 26.0* 28.6* 29.5*  MCV 96.6 93.6 92.4  --  91.7 92.8  PLT 377 335 311  --  311 967    Basic Metabolic Panel: Recent Labs  Lab 10/19/21 0430 10/21/21 0410 10/22/21 0330 10/22/21 0444 10/23/21 0354 10/24/21 0413  NA 147* 139 139 141 137 136  K 4.0 3.9 3.3* 3.6 3.8 4.5  CL 112* 108 106  --  104 105  CO2 _0 --  23 26  GLUCOSE 227* 138* 110*  --  162* 212*  BUN 20 21* 19  --  17 14  CREATININE 0.75 0.88 0.66  --  0.83 0.73  CALCIUM 9.3 9.0 9.0  --  9.2 9.1  MG 1.8  --   --   --  1.9  --   PHOS 2.9  --   --   --   --   --     GFR: Estimated Creatinine Clearance: 79 mL/min (by C-G formula based on SCr of 0.73 mg/dL). Liver Function Tests: No results for input(s): AST, ALT, ALKPHOS, BILITOT, PROT, ALBUMIN in the last 168 hours. No results for input(s): LIPASE, AMYLASE in the last 168 hours. No results for input(s): AMMONIA in the last 168 hours. Coagulation Profile: No results for input(s): INR, PROTIME in the last 168 hours. Cardiac Enzymes: No results for input(s):  CKTOTAL, CKMB, CKMBINDEX, TROPONINI in the last 168 hours. BNP (last 3 results) No results for input(s): PROBNP in the last 8760 hours. HbA1C: No results for input(s): HGBA1C in the last 72 hours. CBG: Recent Labs  Lab 10/24/21 1120 10/24/21 1602 10/24/21 1926 10/25/21 0002 10/25/21 0505  GLUCAP 159* 217* 188* 168* 230*    Lipid Profile: No results for input(s): CHOL, HDL, LDLCALC, TRIG, CHOLHDL, LDLDIRECT in the last 72 hours. Thyroid Function Tests: No results for input(s): TSH, T4TOTAL, FREET4, T3FREE, THYROIDAB in the last 72 hours. Anemia Panel: No results for input(s): VITAMINB12, FOLATE, FERRITIN, TIBC, IRON, RETICCTPCT in the last 72 hours. Sepsis Labs: Recent Labs  Lab 10/19/21 0430 10/20/21 0544  PROCALCITON <0.10 <0.10     Recent Results (from the past 240 hour(s))  Culture, Respiratory w Gram Stain     Status: None   Collection Time: 10/15/21 11:43 AM   Specimen: Bronchoalveolar Lavage; Respiratory  Result Value Ref Range Status   Specimen Description BRONCHIAL ALVEOLAR LAVAGE  Final   Special Requests NONE  Final   Gram Stain   Final    RARE WBC PRESENT, PREDOMINANTLY MONONUCLEAR NO ORGANISMS SEEN    Culture   Final    RARE Normal respiratory flora-no Staph aureus or Pseudomonas seen Performed at [facility], [address] [place], [place] [address]    Report Status 10/17/2021 FINAL  Final  Culture, blood (routine x 2)     Status: None   Collection Time: 10/18/21 11:43 AM   Specimen: Left Antecubital; Blood  Result Value Ref Range Status   Specimen Description LEFT ANTECUBITAL  Final   Special Requests   Final    BOTTLES DRAWN AEROBIC AND ANAEROBIC Blood Culture adequate volume   Culture   Final    NO GROWTH 5 DAYS Performed at [facility], [place] [address] [place] [address] [place], [place] [address]    Report Status 10/23/2021 FINAL  Final  Culture, blood (routine x 2)     Status: None   Collection Time: 10/18/21 11:45 AM   Specimen: BLOOD LEFT  HAND  Result Value Ref Range Status   Specimen Description BLOOD LEFT HAND  Final   Special Requests   Final    BOTTLES DRAWN AEROBIC AND ANAEROBIC Blood Culture adequate volume   Culture   Final    NO GROWTH 5 DAYS Performed at [facility], [place] [address], [place], [place] [address]    Report Status 10/23/2021 FINAL  Final          Radiology Studies:  No results found.      Scheduled Meds:  chlorhexidine gluconate (MEDLINE KIT)  15 mL Mouth Rinse BID   clonazePAM  0.5 mg Per Tube BID   enoxaparin (LOVENOX) injection  40 mg Subcutaneous Q24H   fiber  1 packet Per Tube BID   insulin aspart  0-20 Units Subcutaneous Q4H   insulin aspart  4 Units Subcutaneous Q4H   insulin glargine-yfgn  20 Units Subcutaneous Daily   lisinopril  20 mg Per Tube Daily   mouth rinse  15 mL Mouth Rinse 10 times per day   OLANZapine  10 mg Per Tube QHS   oxyCODONE  10 mg Per Tube TID   oxyCODONE  10 mg Per Tube QHS   pantoprazole sodium  40 mg Per Tube QHS   rosuvastatin  40 mg Per Tube Daily   sertraline  50 mg Per Tube Daily   sodium chloride flush  10-40 mL Intracatheter Q12H   traZODone  50 mg Per Tube QHS   Continuous Infusions:  sodium chloride 250 mL (10/09/21 2310)   sodium chloride Stopped (10/23/21 1331)   feeding supplement (VITAL AF 1.2 CAL)            Aline August, MD [facility] 10/25/2021, 7:14 AM

## 2021-10-25 NOTE — Progress Notes (Signed)
Nutrition Follow-up  DOCUMENTATION CODES:   Non-severe (moderate) malnutrition in context of acute illness/injury  INTERVENTION:   Ensure Enlive po BID, each supplement provides 350 kcal and 20 grams of protein  Multiple meds ordered per tube; encouraged RN to reach out to Pharmacy to have route changed from per tube to oral now that pt is without enteral access.   NUTRITION DIAGNOSIS:   Moderate Malnutrition related to acute illness (VDRF) as evidenced by mild fat depletion, mild muscle depletion.  Being addressed via supplements  GOAL:   Patient will meet greater than or equal to 90% of their needs   MONITOR:   Vent status, Labs, TF tolerance, Skin  REASON FOR ASSESSMENT:   Consult Enteral/tube feeding initiation and management  ASSESSMENT:   Pt with PMH significant for drug abuse, asthma, type 2 DM, HLD, PTSD/anxiety, and chronic pancreatitis admitted with acute hypoxic respiratory failure and acute pulmonary edema requiring intubation.  12/23 Admitted, Intubated 12/24 TF initiated 01/07 Trach placed 01/13 Cortrak placed 01/17 Diet advanced to Regular, Cortrak removed  Noted possible decannulation tomorrow  Dietitian consult received today to resume TF but at lower rate due to emesis. Prior to RD assessment, pt was had MBS and diet advanced to Regular with Thin liquids. Per SLP, pt is motivated to eat  and excited to have solid food.   Cortrak was removed today post MBS prior to RD assessment. TF order remains active-plan to d/c as no enteral access  Noted pt with acute malnutrition this admission  Abd xray with normal bowel gas pattern. +stool, rectal tube removed on 1/15  Insulin regimen changed to meal coverage  Pt with hx of chronic pancreatitis but does not appear to be on PERT as outpatient  Labs: reviewed Meds ss novolog with meals, semglee,    Diet Order:   Diet Order             Diet regular Room service appropriate? Yes with Assist; Fluid  consistency: Thin  Diet effective now                   EDUCATION NEEDS:   Education needs have been addressed  Skin:  Skin Assessment: Reviewed RN Assessment (skin tear to face)  Last BM:  1/16  Height:   Ht Readings from Last 1 Encounters:  10/02/21 5\' 6"  (1.676 m)    Weight:   Wt Readings from Last 1 Encounters:  10/25/21 72.4 kg    BMI:  Body mass index is 25.76 kg/m.  Estimated Nutritional Needs:   Kcal:  1900-2100  Protein:  110-130 gm  Fluid:  >/= 2 L    Kerman Passey MS, RDN, LDN, CNSC Registered Dietitian III Clinical Nutrition RD Pager and On-Call Pager Number Located in [place]

## 2021-10-25 NOTE — Progress Notes (Signed)
RT capped pt's trach and placed pt on 4L [place]. Pt is tolerating trach being capped well at this time. RT will monitor.

## 2021-10-25 NOTE — PMR Pre-admission (Shared)
PMR Admission Coordinator Pre-Admission Assessment  Patient: [name] is an 56 y.o., female MRN: [id number] DOB: March 30, 1966 Height: _0  (167.6 cm) Weight: 72.4 kg  Insurance Information HMO:     PPO:      PCP:      IPA:      80/20:      OTHER:  PRIMARY: [employer] Medicare      Policy#: [id number]      Subscriber: pt CM Name: ***      Phone#: [phone] ext *     Fax#: [phone] Pre-Cert#: [id number]      Employer:  Benefits:  Phone #: [phone]     Name: 1/17 Eff. Date: 10/09/2021     Deduct: none      Out of Pocket Max: $8300      Life Max: none CIR: $370 co pay per day days 1 until 6      SNF: no copay days 1 until 20; $196 co pay per day days 21 until 100 Outpatient: 80%     Co-Pay: 20% Home Health: 100%      Co-Pay: visits per medical neccesity DME: 80%     Co-Pay: 20% Providers: in network  SECONDARY: Medicaid [place] Access      Policy#: [id number] t 8/81 passport one source active 1/17 [facility]  Financial Counselor:       Phone#:   The Data Collection Information Summary for patients in Inpatient Rehabilitation Facilities with attached Privacy Act [place] Records was provided and verbally reviewed with: Patient and Family  Emergency Contact Information Contact Information     Name Relation Home Work [place] Spouse [phone]  [place] Daughter [place], [place] Granddaughter [phone]        Current Medical History  Patient Admitting Diagnosis: Respiratory Failure  History of Present Illness: 56 year old female with past medical history of asthma, HLD, PTSD/anxiety and chronic pancreatitis presented on 09/30/2022 on CPAP via EMS with sats in the mid 60s. Emergently intubated. Reportedly at home partaking in cocaine with spouse when she had sudden onset of sudden and severe SOB.   Difficult weaning form vent due to agitation and tachypnea. Began empiric antibiotics with Vancomycin and Cefepime for  HCAP. Failed extubation therefore trach place on 10/15/21. ON trach collar on 1/13 and trach downsized to #4 cuff less trach on 1/16 and 28 % Fio2. Treated for septic shock and AKI and Hypertensive crisis secondary for cocaine use. Completed 5 day course of azithromycin and Rocephin for cap followed by 5 day course of cefepime.   ICU delirium treated with Precedex until weaned to trach collar. Started Zyprexa, Trazodone, Klonopin and Zoloft with delirium precautions. Cortrak placed on 1/15 with MBS 1/17. After MBS, Regular foods with thin liquids recommended. Cortrak removed. History of HTN on Hyzaar at home. Continue Lisinopril daily. DVT prophylaxis with LMWH.   Patient's medical [facility] has been reviewed by the rehabilitation admission coordinator and physician.  Past Medical History  Past Medical History:  Diagnosis Date   Asthma    Diabetes mellitus without complication (HCC)    GERD (gastroesophageal reflux disease)    Heart murmur    from childhood rheumatic fever, per patient   Hyperlipidemia    Pneumonia 12/14   Wears partial dentures    top and bottom partials    Has the patient had major surgery during 100 days prior to admission? Yes  Family History   family  history includes Diabetes in her mother; Heart disease in her mother.  Current Medications  Current Facility-Administered Medications:    0.9 %  sodium chloride infusion, 250 mL, Intravenous, Continuous, Noemi Chapel, MD, Last Rate: 10 mL/hr at 10/09/21 2310, 250 mL at 10/09/21 2310   0.9 %  sodium chloride infusion, , Intravenous, PRN, Spero Geralds, MD, Stopped at 10/23/21 1331   acetaminophen (TYLENOL) 160 MG/5ML solution 650 mg, 650 mg, Per Tube, Q4H PRN, Rigoberto Noel, MD, 650 mg at 10/23/21 1549   albuterol (PROVENTIL) (2.5 MG/3ML) 0.083% nebulizer solution 2.5 mg, 2.5 mg, Nebulization, Q2H PRN, Olalere, Adewale A, MD, 2.5 mg at 10/07/21 2334   chlorhexidine gluconate (MEDLINE KIT)  (PERIDEX) 0.12 % solution 15 mL, 15 mL, Mouth Rinse, BID, Ruthann Cancer, Jessica, DO, 15 mL at 10/25/21 8413   clonazePAM (KLONOPIN) tablet 0.5 mg, 0.5 mg, Per Tube, BID, Lacinda Axon, MD, 0.5 mg at 10/25/21 2440   docusate (COLACE) 50 MG/5ML liquid 100 mg, 100 mg, Per Tube, Daily PRN, Collene Gobble, MD   enoxaparin (LOVENOX) injection 40 mg, 40 mg, Subcutaneous, Q24H, Candee Furbish, MD, 40 mg at 10/25/21 1027   feeding supplement (VITAL AF 1.2 CAL) liquid 1,000 mL, 1,000 mL, Per Tube, Continuous, Alekh, Kshitiz, MD   fiber (NUTRISOURCE FIBER) 1 packet, 1 packet, Per Tube, BID, Freddi Starr, MD, 1 packet at 10/24/21 2113   guaiFENesin-dextromethorphan (ROBITUSSIN DM) 100-10 MG/5ML syrup 5 mL, 5 mL, Oral, Q4H PRN, Freda Jackson B, MD, 5 mL at 10/23/21 1023   insulin aspart (novoLOG) injection 0-15 Units, 0-15 Units, Subcutaneous, TID WC, Alekh, Kshitiz, MD   insulin aspart (novoLOG) injection 0-5 Units, 0-5 Units, Subcutaneous, QHS, Alekh, Kshitiz, MD   insulin glargine-yfgn (SEMGLEE) injection 20 Units, 20 Units, Subcutaneous, Daily, Alekh, Kshitiz, MD, 20 Units at 10/25/21 0929   lisinopril (ZESTRIL) tablet 20 mg, 20 mg, Per Tube, Daily, Lacinda Axon, MD, 20 mg at 10/25/21 0927   OLANZapine (ZYPREXA) tablet 10 mg, 10 mg, Per Tube, QHS, Freda Jackson B, MD, 10 mg at 10/24/21 2111   ondansetron (ZOFRAN) injection 4 mg, 4 mg, Intravenous, Q6H, Alekh, Kshitiz, MD, 4 mg at 10/25/21 1329   oxyCODONE (Oxy IR/ROXICODONE) immediate release tablet 10 mg, 10 mg, Per Tube, TID, Lacinda Axon, MD, 10 mg at 10/25/21 1329   oxyCODONE (Oxy IR/ROXICODONE) immediate release tablet 10 mg, 10 mg, Per Tube, QHS, Freda Jackson B, MD, 10 mg at 10/24/21 2111   pantoprazole sodium (PROTONIX) 40 mg/20 mL oral suspension 40 mg, 40 mg, Per Tube, QHS, Spero Geralds, MD, 40 mg at 10/24/21 2112   polyethylene glycol (MIRALAX / GLYCOLAX) packet 17 g, 17 g, Per Tube, Daily PRN, Sloan Leiter B,  RPH   rosuvastatin (CRESTOR) tablet 40 mg, 40 mg, Per Tube, Daily, Spero Geralds, MD, 40 mg at 10/25/21 2536   sertraline (ZOLOFT) tablet 50 mg, 50 mg, Per Tube, Daily, Spero Geralds, MD, 50 mg at 10/25/21 0927   sodium chloride flush (NS) 0.9 % injection 10-40 mL, 10-40 mL, Intracatheter, Q12H, Olalere, Adewale A, MD, 10 mL at 10/24/21 0839   sodium chloride flush (NS) 0.9 % injection 10-40 mL, 10-40 mL, Intracatheter, PRN, Olalere, Adewale A, MD   traZODone (DESYREL) tablet 50 mg, 50 mg, Per Tube, QHS, Freddi Starr, MD, 50 mg at 10/24/21 2111   white petrolatum (VASELINE) gel, , Topical, PRN, Sherrilyn Rist A, MD, Given at 10/08/21 1143  Patients Current Diet:  Diet  Order             Diet regular Room service appropriate? Yes with Assist; Fluid consistency: Thin  Diet effective now                  Precautions / Restrictions Precautions Precautions: Fall, Other (comment) Precaution Comments: trach, coretrak Restrictions Weight Bearing Restrictions: No   Has the patient had 2 or more falls or a fall with injury in the past year? No  Prior Activity Level Community (5-7x/wk): independent  Prior Functional Level Self Care: Did the patient need help bathing, dressing, using the toilet or eating? Independent  Indoor Mobility: Did the patient need assistance with walking from room to room (with or without device)? Independent  Stairs: Did the patient need assistance with internal or external stairs (with or without device)? Independent  Functional Cognition: Did the patient need help planning regular tasks such as shopping or remembering to take medications? Independent  Patient Information Are you of Hispanic, Latino/a,or Spanish origin?: A. No, not of Hispanic, Latino/a, or Spanish origin What is your race?: B. Black or African American Do you need or want an interpreter to communicate with a doctor or health care staff?: 0. No  Patient's Response To:  Health  Literacy and Transportation Is the patient able to respond to health literacy and transportation needs?: Yes Health Literacy - How often do you need to have someone help you when you read instructions, pamphlets, or other written material from your doctor or pharmacy?: Never In the past 12 months, has lack of transportation kept you from medical appointments or from getting medications?: No In the past 12 months, has lack of transportation kept you from meetings, work, or from getting things needed for daily living?: No  Home Assistive Devices / Equipment Home Equipment: Conservation officer, nature (2 wheels), BSC/3in1, Tub bench  Prior Device Use: Indicate devices/aids used by the patient prior to current illness, exacerbation or injury? None of the above  Current Functional Level Cognition  Overall Cognitive Status: Impaired/Different from baseline Difficult to assess due to: Tracheostomy Current Attention Level: Sustained Orientation Level: Oriented to person, Oriented to place, Oriented to situation Following Commands: Follows multi-step commands inconsistently, Follows one step commands with increased time Safety/Judgement: Decreased awareness of safety, Decreased awareness of deficits General Comments: pt remains quick to move and with decreased insight to safety, pt remains motivated to improve, pt able to say todays date    Extremity Assessment (includes Sensation/Coordination)  Upper Extremity Assessment: Generalized weakness RUE Deficits / Details: Unable to maintain position of RUE in space against gravity. Able to make a loose fist. RUE Coordination: decreased gross motor, decreased fine motor LUE Deficits / Details: Unable to maintain position of RUE in space against gravity. Able to make a loose fist. LUE Coordination: decreased fine motor, decreased gross motor  Lower Extremity Assessment: Defer to PT evaluation RLE Deficits / Details: generalized weakness, slow to respond to command,  fine motor coordination impaired. RLE Coordination: decreased fine motor LLE Deficits / Details: generalized weakness, slow to respond to command, fine motor coordination impaired. LLE Coordination: decreased fine motor    ADLs  Overall ADL's : Needs assistance/impaired Eating/Feeding: NPO (NGT) Grooming: Wash/dry hands, Wash/dry face, Min guard Grooming Details (indicate cue type and reason): Total A to wash face seate EOB. Upper Body Bathing: Total assistance Lower Body Bathing: Moderate assistance, Sit to/from stand Lower Body Bathing Details (indicate cue type and reason): simulated Upper Body Dressing : Minimal assistance, Sitting  Lower Body Dressing: Min guard, Sitting/lateral leans Lower Body Dressing Details (indicate cue type and reason): donning socks seated EOB Toilet Transfer: Moderate assistance, Ambulation, Rolling walker (2 wheels), Cueing for safety Toileting- Clothing Manipulation and Hygiene: Minimal assistance, Sit to/from stand Functional mobility during ADLs: Moderate assistance, Rolling walker (2 wheels), Cueing for safety General ADL Comments: Grossly dependent    Mobility  Overal bed mobility: Needs Assistance Bed Mobility: Supine to Sit, Sit to Supine Supine to sit: Supervision Sit to supine: Supervision General bed mobility comments: pt sitting EOB upon PT arrival    Transfers  Overall transfer level: Needs assistance Equipment used: Rolling walker (2 wheels) Transfers: Sit to/from Stand Sit to Stand: Mod assist General transfer comment: mod verbal cues for safe hand placement, impulsive    Ambulation / Gait / Stairs / Wheelchair Mobility  Ambulation/Gait Ambulation/Gait assistance: Mod assist, +2 safety/equipment Gait Distance (Feet): 100 Feet (x1, 60x1) Assistive device: Rolling walker (2 wheels), None Gait Pattern/deviations: Step-through pattern, Decreased stride length, Narrow base of support General Gait Details: pt continues with narrow base  of support and bilat LE externally rotated, pt with improved R LE strength, less knee buckling, noted with R knee buckling with onset of fatigue at about 74', pt continues to bump into things on the L requiring verbal cues to correct, pt with noted impaired spatial awareness. Trialed ambulation with L HHA x 60', pt with noted frequent R Knee buckling requiring modA to prevent fall, pt also with LOB with turning both to L and R with and without RW Gait velocity: more guarded and contolled today compared to yesterday Gait velocity interpretation: <1.31 ft/sec, indicative of household ambulator    Posture / Balance Dynamic Sitting Balance Sitting balance - Comments: Sat EOB with min guard to min assist Balance Overall balance assessment: Needs assistance Sitting-balance support: Bilateral upper extremity supported, Single extremity supported, Feet supported Sitting balance-Leahy Scale: Fair Sitting balance - Comments: Sat EOB with min guard to min assist Postural control: Right lateral lean Standing balance support: During functional activity Standing balance-Leahy Scale: Poor Standing balance comment: dependent on external support    Special needs/care consideration 1/16 $ 4 uncuffed trach and capped on 1/17 Deliirium Precautions MBS 1/7 with cortrak removed and began regular diet with thin liquids   Previous Home Environment  Living Arrangements: Spouse/significant other  Lives With: Spouse Available Help at Discharge:  (daughter from [place] and spouse to [place]is[place]) Type of Home: House Home Layout: One level Home Access: Stairs to enter Entrance Stairs-Rails: Right Entrance Stairs-Number of Steps: 2 Bathroom Shower/Tub: Chiropodist: Standard Bathroom Accessibility: Yes How Accessible: Accessible via walker [place]: No  Discharge Living Setting Plans for Discharge Living Setting: Patient's home, Lives with (comment) (spouse) Type of Home at Discharge:  House Discharge Home Layout: One level Discharge Home Access: Stairs to enter Entrance Stairs-Rails: Right Entrance Stairs-Number of Steps: 2 Discharge Bathroom Shower/Tub: Tub/shower unit Discharge Bathroom Toilet: Standard Discharge Bathroom Accessibility: Yes How Accessible: Accessible via walker Does the patient have any problems obtaining your medications?: No  Social/Family/Support Systems Patient Roles: Spouse, Parent Contact Information: spouse, Kyung Rudd Anticipated Caregiver: spouse and daughter from [place]: see contacts Ability/Limitations of Caregiver: none Caregiver Availability: 24/7 Discharge Plan Discussed with Primary Caregiver: Yes Is Caregiver In Agreement with Plan?: Yes Does Caregiver/Family have Issues with Lodging/Transportation while Pt is in Rehab?: No  Goals Patient/Family Goal for Rehab: supervision to min assist with PT and OT, supervision with SLP  Expected length of stay: ELOS 10 to 14 days Additional Information: Spouse remains at bedside overnight Pt/Family Agrees to Admission and willing to participate: Yes Program Orientation Provided & Reviewed with Pt/Caregiver Including Roles  & Responsibilities: Yes  Decrease burden of Care through IP rehab admission: n/a  Possible need for SNF placement upon discharge: not anticipated  Patient Condition: I have reviewed medical records from [facility], spoken with CM, and patient, spouse, and daughter. I met with patient at the bedside for inpatient rehabilitation assessment.  Patient will benefit from ongoing PT, OT, and SLP, can actively participate in 3 hours of therapy a day 5 days of the week, and can make measurable gains during the admission.  Patient will also benefit from the coordinated team approach during an Inpatient Acute Rehabilitation admission.  The patient will receive intensive therapy as well as Rehabilitation physician, nursing, social worker,  and care management interventions.  Due to bladder management, bowel management, safety, skin/wound care, disease management, medication administration, pain management, and patient education the patient requires 24 hour a day rehabilitation nursing.  The patient is currently mod assist overall with mobility and basic ADLs.  Discharge setting and therapy post discharge at home with home health is anticipated.  Patient has agreed to participate in the Acute Inpatient Rehabilitation Program and will admit today.  Preadmission Screen Completed By:  Cleatrice Burke, 10/25/2021 3:51 PM ______________________________________________________________________   Discussed status with Dr. Marland Kitchen on *** at *** and received approval for admission today.  Admission Coordinator:  Cleatrice Burke, RN, time Marland KitchenSudie Grumbling ***   Assessment/Plan: Diagnosis: Does the need for close, 24 hr/day Medical supervision in concert with the patient's rehab needs make it unreasonable for this patient to be served in a less intensive setting? {yes_no_potentially:3041433} Co-Morbidities requiring supervision/potential complications: *** Due to {due ZR:9234144}, does the patient require 24 hr/day rehab nursing? {yes_no_potentially:3041433} Does the patient require coordinated care of a physician, rehab nurse, PT, OT, and SLP to address physical and functional deficits in the context of the above medical diagnosis(es)? {yes_no_potentially:3041433} Addressing deficits in the following areas: {deficits:3041436} Can the patient actively participate in an intensive therapy program of at least 3 hrs of therapy 5 days a week? {yes_no_potentially:3041433} The potential for patient to make measurable gains while on inpatient rehab is {potential:3041437} Anticipated functional outcomes upon discharge from inpatient rehab: {functional outcomes:304600100} PT, {functional outcomes:304600100} OT, {functional outcomes:304600100}  SLP Estimated rehab length of stay to reach the above functional goals is: *** Anticipated discharge destination: {anticipated dc setting:21604} 10. Overall Rehab/Functional Prognosis: {potential:3041437}   MD Signature: ***

## 2021-10-25 NOTE — Progress Notes (Signed)
Occupational Therapy Treatment Patient Details Name: [name] MRN: [id number] DOB: Dec 30, 1965 Today's Date: 10/25/2021   History of present illness Pt is a 56 y.o. female admitted 09/30/21 after becoming minimally responsive after doing cocaine with husband, required emergent intubation 12/23. ETT 12/23-12/31, reintubated 12/31-1/6, failed again; s/p trach 1/7. PMH includes asthma, DM2, HLD, PTSD, anxiety, chronic pancreatitis.   OT comments  Pt making good progress with functional goals and is eager to work with therapy stating "I want to go home and see my family". Pt continues to present with impaired ADLs and ADL mobility, strength, balance, decreased insight to deficits and safety. Pt to continue to benefit from d/c recommendation to AIR after acute care stay to address both cognitive and functional deficits. Pt's husband and daughter in to see pt at end of session   Recommendations for follow up therapy are one component of a multi-disciplinary discharge planning process, led by the attending physician.  Recommendations may be updated based on patient status, additional functional criteria and insurance authorization.    Follow Up Recommendations  Acute inpatient rehab (3hours/day)    Assistance Recommended at Discharge Frequent or constant Supervision/Assistance  Patient can return home with the following  A little help with bathing/dressing/bathroom;Direct supervision/assist for medications management;Two people to help with walking and/or transfers   Equipment Recommendations  Tub/shower seat;Other (comment) (RW, TBD at next venue of care)    Recommendations for Other Services      Precautions / Restrictions Precautions Precautions: Fall;Other (comment) Precaution Comments: trach, coretrak Restrictions Weight Bearing Restrictions: No       Mobility Bed Mobility Overal bed mobility: Needs Assistance Bed Mobility: Supine to Sit, Sit to Supine     Supine to sit:  Supervision Sit to supine: Supervision        Transfers Overall transfer level: Needs assistance Equipment used: Rolling walker (2 wheels) Transfers: Sit to/from Stand Sit to Stand: Mod assist           General transfer comment: mod verbal cues for safe hand placement, impulsive     Balance Overall balance assessment: Needs assistance Sitting-balance support: Bilateral upper extremity supported, Single extremity supported, Feet supported Sitting balance-Leahy Scale: Fair Sitting balance - Comments: Sat EOB with min guard to min assist Postural control: Right lateral lean Standing balance support: During functional activity Standing balance-Leahy Scale: Poor                             ADL either performed or assessed with clinical judgement   ADL Overall ADL's : Needs assistance/impaired     Grooming: Wash/dry hands;Wash/dry face;Min guard       Lower Body Bathing: Moderate assistance;Sit to/from stand Lower Body Bathing Details (indicate cue type and reason): simulated Upper Body Dressing : Minimal assistance;Sitting   Lower Body Dressing: Min guard;Sitting/lateral leans Lower Body Dressing Details (indicate cue type and reason): donning socks seated EOB Toilet Transfer: Moderate assistance;Ambulation;Rolling walker (2 wheels);Cueing for safety   Toileting- Clothing Manipulation and Hygiene: Minimal assistance;Sit to/from stand       Functional mobility during ADLs: Moderate assistance;Rolling walker (2 wheels);Cueing for safety      Extremity/Trunk Assessment Upper Extremity Assessment Upper Extremity Assessment: Generalized weakness   Lower Extremity Assessment Lower Extremity Assessment: Defer to PT evaluation   Cervical / Trunk Assessment Cervical / Trunk Assessment: Normal    Vision Baseline Vision/History: 1 Wears glasses Ability to See in Adequate Light: 0 Adequate Patient Visual Report:  No change from baseline     Perception      Praxis      Cognition Arousal/Alertness: Awake/alert Behavior During Therapy: Impulsive Overall Cognitive Status: Impaired/Different from baseline Area of Impairment: Safety/judgement, Problem solving, Awareness, Memory                     Memory: Decreased short-term memory Following Commands: Follows multi-step commands inconsistently, Follows one step commands with increased time Safety/Judgement: Decreased awareness of safety, Decreased awareness of deficits   Problem Solving: Requires verbal cues, Requires tactile cues          Exercises      Shoulder Instructions       General Comments SpO2 at 91-93% on 6LO2 via trach collar    Pertinent Vitals/ Pain       Pain Assessment Pain Assessment: Faces Pain Score: 0-No pain Faces Pain Scale: No hurt  Home Living                                          Prior Functioning/Environment              Frequency  Min 2X/week        Progress Toward Goals  OT Goals(current goals can now be found in the care plan section)  Progress towards OT goals: Progressing toward goals     Plan Discharge plan remains appropriate;Frequency remains appropriate    Co-evaluation                 AM-PAC OT "6 Clicks" Daily Activity     Outcome Measure   Help from another person eating meals?: A Little Help from another person taking care of personal grooming?: A Little Help from another person toileting, which includes using toliet, bedpan, or urinal?: A Lot Help from another person bathing (including washing, rinsing, drying)?: A Lot Help from another person to put on and taking off regular upper body clothing?: A Little Help from another person to put on and taking off regular lower body clothing?: A Lot 6 Click Score: 15    End of Session Equipment Utilized During Treatment: Gait belt;Rolling walker (2 wheels)  OT Visit Diagnosis: Unsteadiness on feet (R26.81);Muscle weakness  (generalized) (M62.81);Other symptoms and signs involving cognitive function   Activity Tolerance Patient tolerated treatment well   Patient Left in bed;with call bell/phone within reach;with bed alarm set;with family/visitor present   Nurse Communication          Time: 8280-0349 OT Time Calculation (min): 19 min  Charges: OT General Charges $OT Visit: 1 Visit OT Treatments $Self Care/Home Management : 8-22 mins    Britt Bottom 10/25/2021, 11:17 AM

## 2021-10-25 NOTE — Progress Notes (Signed)
Inpatient Rehabilitation Admissions Coordinator   I will begin Auth with [facility] Medicare for possible Cir admit.  Danne Baxter, RN, MSN Rehab Admissions Coordinator [phone] 10/25/2021 12:52 PM

## 2021-10-25 NOTE — Progress Notes (Signed)
Modified Barium Swallow Progress Note  Patient Details  Name: [name] MRN: [id number] Date of Birth: 10-28-1965  Today's Date: 10/25/2021  Modified Barium Swallow completed.  Full report located under Chart Review in the Imaging Section.  Brief recommendations include the following:  Clinical Impression  Pt presents with a very mild pharyngeal dysphagia marked by high/transient penetration of thin liquids (Penetration/Aspiration scale of 2 and 3) during the swallow.  There was no aspiration, despite large, successive boluses and mixed solid and liquid consistencies.  Oral phase was WNL. There was excellent pharyngeal transfer with no residue post swallow.  Pt's trach was capped during study; her voice was stronger and of clearer quality.  She was very excited about results and motivated to eat.  Recommend starting a regular diet with thin liquids. Meds whole in water.  Cortrak can be discontinued.   Swallow Evaluation Recommendations       SLP Diet Recommendations: Regular solids;Thin liquid   Liquid Administration via: Straw   Medication Administration: Whole meds with liquid   Supervision: Patient able to self feed                  Amanda Joynt L. Tivis Ringer, [place] Office number [phone] Pager [phone]   [name] 10/25/2021,1:38 PM

## 2021-10-26 ENCOUNTER — Encounter: Payer: Self-pay | Admitting: Internal Medicine

## 2021-10-26 ENCOUNTER — Inpatient Hospital Stay (HOSPITAL_COMMUNITY)
Admission: RE | Admit: 2021-10-26 | Payer: Medicare HMO | Source: Intra-hospital | Admitting: Physical Medicine & Rehabilitation

## 2021-10-26 LAB — CBC WITH DIFFERENTIAL/PLATELET
Abs Immature Granulocytes: 0.13 10*3/uL — ABNORMAL HIGH (ref 0.00–0.07)
Basophils Absolute: 0.1 10*3/uL (ref 0.0–0.1)
Basophils Relative: 1 %
Eosinophils Absolute: 0.4 10*3/uL (ref 0.0–0.5)
Eosinophils Relative: 3 %
HCT: 33.9 % — ABNORMAL LOW (ref 36.0–46.0)
Hemoglobin: 11 g/dL — ABNORMAL LOW (ref 12.0–15.0)
Immature Granulocytes: 1 %
Lymphocytes Relative: 38 %
Lymphs Abs: 6.3 10*3/uL — ABNORMAL HIGH (ref 0.7–4.0)
MCH: 30.1 pg (ref 26.0–34.0)
MCHC: 32.4 g/dL (ref 30.0–36.0)
MCV: 92.6 fL (ref 80.0–100.0)
Monocytes Absolute: 1.5 10*3/uL — ABNORMAL HIGH (ref 0.1–1.0)
Monocytes Relative: 9 %
Neutro Abs: 8.1 10*3/uL — ABNORMAL HIGH (ref 1.7–7.7)
Neutrophils Relative %: 48 %
Platelets: 369 10*3/uL (ref 150–400)
RBC: 3.66 MIL/uL — ABNORMAL LOW (ref 3.87–5.11)
RDW: 12.8 % (ref 11.5–15.5)
WBC: 16.5 10*3/uL — ABNORMAL HIGH (ref 4.0–10.5)
nRBC: 0 % (ref 0.0–0.2)

## 2021-10-26 LAB — COMPREHENSIVE METABOLIC PANEL
ALT: 54 U/L — ABNORMAL HIGH (ref 0–44)
AST: 37 U/L (ref 15–41)
Albumin: 2.8 g/dL — ABNORMAL LOW (ref 3.5–5.0)
Alkaline Phosphatase: 66 U/L (ref 38–126)
Anion gap: 9 (ref 5–15)
BUN: 15 mg/dL (ref 6–20)
CO2: 30 mmol/L (ref 22–32)
Calcium: 9.4 mg/dL (ref 8.9–10.3)
Chloride: 95 mmol/L — ABNORMAL LOW (ref 98–111)
Creatinine, Ser: 0.96 mg/dL (ref 0.44–1.00)
GFR, Estimated: >60 mL/min (ref >60)
Glucose, Bld: 168 mg/dL — ABNORMAL HIGH (ref 70–99)
Potassium: 3.5 mmol/L (ref 3.5–5.1)
Sodium: 134 mmol/L — ABNORMAL LOW (ref 135–145)
Total Bilirubin: 0.5 mg/dL (ref 0.3–1.2)
Total Protein: 7.4 g/dL (ref 6.5–8.1)

## 2021-10-26 LAB — MAGNESIUM: Magnesium: 2 mg/dL (ref 1.7–2.4)

## 2021-10-26 LAB — GLUCOSE, CAPILLARY: Glucose-Capillary: 195 mg/dL — ABNORMAL HIGH (ref 70–99)

## 2021-10-26 MED ORDER — METHOCARBAMOL 500 MG PO TABS: 500.0000 mg | ORAL_TABLET | Freq: Three times a day (TID) | ORAL | 3 refills | Status: DC | PRN

## 2021-10-26 MED ORDER — LANTUS SOLOSTAR 100 UNIT/ML ~~LOC~~ SOPN: 20.0000 [IU] | PEN_INJECTOR | Freq: Every day | SUBCUTANEOUS | Status: AC

## 2021-10-26 NOTE — H&P (Incomplete)
Physical Medicine and Rehabilitation Admission H&P    Chief Complaint  Patient presents with   Respiratory Distress  : HPI: [name] is a 56 year old right-handed female with history of asthma/quit smoking 5 years ago, type 2 diabetes mellitus, hyperlipidemia, PTSD/anxiety and chronic pancreatitis.  Per chart review patient lives with significant other.  1 level home 2 steps to entry.  Independent prior to admission.  She does not drive.  Presented 09/30/2021 with altered mental status after partaking with cocaine and severe shortness of breath.  She was emergently intubated for airway protection.  Noted blood pressure systolic greater than 267.  She was started on nitro infusion.  Admission chemistry sodium 133 glucose 256 creatinine 1.04, WBC 12,800, troponin 12, lactic acid 3.0, BNP 31, urine drug screen positive cocaine, hemoglobin A1c 9.1.  Hospital course with multiple attempts for extubation ultimately required tracheostomy 10/15/2021 per Dr. Ina Homes with critical care and placed on trach collar 10/21/2021.  She was initially maintained on empiric antibiotics for HCAP and antibiotic course has been completed.  Echocardiogram with ejection fraction 65 to 70% no wall motion abnormalities.  Lovenox for DVT prophylaxis.  Her diet has been advanced to a regular consistency and tube feeds discontinued 10/24/2021.  ICU delirium requiring Precedex currently maintained on Zyprexa with follow-up assistance per psychiatry services.  Therapy evaluations completed due to patient decreased functional mobility was admitted for a comprehensive rehab program.  Review of Systems  Constitutional:  Negative for fever.  HENT:  Negative for hearing loss.   Eyes:  Negative for blurred vision and double vision.  Respiratory:  Positive for shortness of breath. Negative for wheezing.   Cardiovascular:  Positive for leg swelling. Negative for chest pain and palpitations.  Gastrointestinal:  Positive for  constipation. Negative for heartburn, nausea and vomiting.       GERD  Genitourinary:  Negative for dysuria, flank pain and hematuria.  Musculoskeletal:  Positive for joint pain and myalgias.  Skin:  Negative for rash.  Neurological:  Positive for headaches.  Psychiatric/Behavioral:         PTSD/anxiety  All other systems reviewed and are negative. Past Medical History:  Diagnosis Date   Asthma    Diabetes mellitus without complication (HCC)    GERD (gastroesophageal reflux disease)    Heart murmur    from childhood rheumatic fever, per patient   Hyperlipidemia    Pneumonia 12/14   Wears partial dentures    top and bottom partials   Past Surgical History:  Procedure Laterality Date   ANTERIOR LAT LUMBAR FUSION Left 07/31/2013   Procedure: ANTERIOR LATERAL LUMBAR FUSION 1 LEVEL/Left sided lumbar 3-4 lateral interbody fusion with instrumentation and allograft.;  Surgeon: Sinclair Ship, MD;  Location: [facility];  Service: Orthopedics;  Laterality: Left;  Left sided lumbar 3-4 lateral interbody fusion with instrumentation and allograft.   APPENDECTOMY  30- 40 years   carpal tunnel     right arm   CARPAL TUNNEL RELEASE Right 12/15/2013   Procedure: RIGHT CARPAL TUNNEL RELEASE ENDOSCOPIC;  Surgeon: Jolyn Nap, MD;  Location: [facility];  Service: Orthopedics;  Laterality: Right;   LATERAL EPICONDYLE RELEASE Right 12/15/2013   Procedure: RIGHT ULNAR NEUROPLASTY AT ELBOW ;  Surgeon: Jolyn Nap, MD;  Location: [facility];  Service: Orthopedics;  Laterality: Right;   LUMBAR FUSION     Family History  Problem Relation Age of Onset   Diabetes Mother    Heart disease Mother  Social History:  reports that she quit smoking about 5 years ago. She has never used smokeless tobacco. She reports that she does not drink alcohol and does not use drugs. Allergies: No Known Allergies Medications Prior to Admission  Medication Sig Dispense Refill    albuterol (PROVENTIL HFA;VENTOLIN HFA) 108 (90 Base) MCG/ACT inhaler Inhale 1-2 puffs into the lungs every 6 (six) hours as needed for wheezing or shortness of breath. 1 Inhaler 0   Aspirin-Caffeine (BAYER BACK & BODY PO) Take 1 tablet by mouth at bedtime.     baclofen (LIORESAL) 10 MG tablet Take 10 mg by mouth 3 (three) times daily as needed for muscle spasms.     Cholecalciferol (VITAMIN D) 50 MCG (2000 UT) tablet Take 2,000 Units by mouth daily.     JANUMET 50-1000 MG tablet Take 1 tablet by mouth 2 (two) times daily.     LANTUS SOLOSTAR 100 UNIT/ML Solostar Pen Inject 40 Units into the skin at bedtime. (Patient taking differently: Inject 45 Units into the skin at bedtime.) 15 mL 4   lisinopril-hydrochlorothiazide (ZESTORETIC) 20-25 MG tablet Take 1 tablet by mouth daily.     methocarbamol (ROBAXIN) 500 MG tablet Take 1 tablet (500 mg total) by mouth 4 (four) times daily. 120 tablet 3   oxyCODONE-acetaminophen (PERCOCET) 10-325 MG tablet Take 1 tablet by mouth 5 (five) times daily.     pantoprazole (PROTONIX) 40 MG tablet Take 40 mg by mouth daily.     Phenyleph-Doxylamine-DM-APAP (NYQUIL SEVERE COLD/FLU PO) Take 1 Dose by mouth See admin instructions. 2-3 times per day     RELION PEN NEEDLE 31G/8MM 31G X 8 MM MISC Inject 1 each into the skin daily. use as directed 100 each 2   rosuvastatin (CRESTOR) 40 MG tablet Take 40 mg by mouth every evening.     sertraline (ZOLOFT) 50 MG tablet Take 50 mg by mouth daily.     ACCU-CHEK AVIVA PLUS test strip USE AS DIRECTED FOR 30 DAYS  6   lisinopril-hydrochlorothiazide (PRINZIDE,ZESTORETIC) 20-12.5 MG tablet Take 1 tablet by mouth daily. (Patient not taking: Reported on 09/30/2021) 90 tablet 1   montelukast (SINGULAIR) 10 MG tablet Take 1 tablet (10 mg total) by mouth every evening. (Patient not taking: Reported on 09/30/2021) 90 tablet 3   pantoprazole (PROTONIX) 20 MG tablet Take 1 tablet (20 mg total) by mouth daily. (Patient not taking: Reported on  09/30/2021) 30 tablet 1   rosuvastatin (CRESTOR) 10 MG tablet Take 1 tablet (10 mg total) by mouth daily. (Patient not taking: Reported on 09/30/2021) 90 tablet 3   sertraline (ZOLOFT) 25 MG tablet Take 1 tablet (25 mg total) by mouth daily. (Patient not taking: Reported on 09/30/2021) 90 tablet 1    Drug Regimen Review Drug regimen was reviewed and remains appropriate with no significant issues identified  Home: Home Living Family/patient expects to be discharged to:: Private residence Living Arrangements: Spouse/significant other Available Help at Discharge:  (daughter from [place] and spouse to [place]) Type of Home: House Home Access: Stairs to enter Technical brewer of Steps: 2 Entrance Stairs-Rails: Right Home Layout: One level Bathroom Shower/Tub: Chiropodist: Standard Bathroom Accessibility: Yes Home Equipment: Conservation officer, nature (2 wheels), BSC/3in1, Tub bench  Lives With: Spouse   Functional History: Prior Function Prior Level of Function : Independent/Modified Independent ADLs Comments: I with ADLs/IADLs without AD. Does not drive.  Functional Status:  Mobility: Bed Mobility Overal bed mobility: Needs Assistance Bed Mobility: Supine to Sit, Sit to Supine  Supine to sit: Supervision Sit to supine: Supervision General bed mobility comments: pt sitting EOB upon PT arrival Transfers Overall transfer level: Needs assistance Equipment used: Rolling walker (2 wheels) Transfers: Sit to/from Stand Sit to Stand: Mod assist General transfer comment: mod verbal cues for safe hand placement, impulsive Ambulation/Gait Ambulation/Gait assistance: Mod assist, +2 safety/equipment Gait Distance (Feet): 100 Feet (x1, 60x1) Assistive device: Rolling walker (2 wheels), None Gait Pattern/deviations: Step-through pattern, Decreased stride length, Narrow base of support General Gait Details: pt continues with narrow base of support and bilat LE externally rotated, pt  with improved R LE strength, less knee buckling, noted with R knee buckling with onset of fatigue at about 74', pt continues to bump into things on the L requiring verbal cues to correct, pt with noted impaired spatial awareness. Trialed ambulation with L HHA x 60', pt with noted frequent R Knee buckling requiring modA to prevent fall, pt also with LOB with turning both to L and R with and without RW Gait velocity: more guarded and contolled today compared to yesterday Gait velocity interpretation: <1.31 ft/sec, indicative of household ambulator    ADL: ADL Overall ADL's : Needs assistance/impaired Eating/Feeding: NPO (NGT) Grooming: Wash/dry hands, Wash/dry face, Min guard Grooming Details (indicate cue type and reason): Total A to wash face seate EOB. Upper Body Bathing: Total assistance Lower Body Bathing: Moderate assistance, Sit to/from stand Lower Body Bathing Details (indicate cue type and reason): simulated Upper Body Dressing : Minimal assistance, Sitting Lower Body Dressing: Min guard, Sitting/lateral leans Lower Body Dressing Details (indicate cue type and reason): donning socks seated EOB Toilet Transfer: Moderate assistance, Ambulation, Rolling walker (2 wheels), Cueing for safety Toileting- Clothing Manipulation and Hygiene: Minimal assistance, Sit to/from stand Functional mobility during ADLs: Moderate assistance, Rolling walker (2 wheels), Cueing for safety General ADL Comments: Grossly dependent  Cognition: Cognition Overall Cognitive Status: Impaired/Different from baseline Orientation Level: Oriented to person, Oriented to place, Oriented to time Cognition Arousal/Alertness: Awake/alert Behavior During Therapy: Impulsive Overall Cognitive Status: Impaired/Different from baseline Area of Impairment: Safety/judgement, Problem solving, Awareness, Memory Orientation Level: Disoriented to, Person, Place, Time, Situation Current Attention Level: Sustained Memory:  Decreased short-term memory Following Commands: Follows multi-step commands inconsistently, Follows one step commands with increased time Safety/Judgement: Decreased awareness of safety, Decreased awareness of deficits Awareness: Emergent Problem Solving: Requires verbal cues, Requires tactile cues General Comments: pt remains quick to move and with decreased insight to safety, pt remains motivated to improve, pt able to say todays date Difficult to assess due to: Tracheostomy  Physical Exam: Blood pressure (!) 99/58, pulse 90, temperature 98.9 F (37.2 C), temperature source Oral, resp. rate 18, height 5\' 6"  (1.676 m), weight 73.2 kg, last menstrual period 03/07/2012, SpO2 90 %. Physical Exam Neck:     Comments: #4  Cuffless Shiley trach in place Neurological:     Comments: Patient is a bit anxious.  Daughter and husband at bedside.  Makes eye contact with examiner.  Her trach is capped she can provide her name and age but does exhibit some decrease in awareness.    Results for orders placed or performed during the hospital encounter of 09/30/21 (from the past 48 hour(s))  Glucose, capillary     Status: Abnormal   Collection Time: 10/24/21  7:22 AM  Result Value Ref Range   Glucose-Capillary 209 (H) 70 - 99 mg/dL    Comment: Glucose reference range applies only to samples taken after fasting for at least 8 hours.  Glucose,  capillary     Status: Abnormal   Collection Time: 10/24/21 11:20 AM  Result Value Ref Range   Glucose-Capillary 159 (H) 70 - 99 mg/dL    Comment: Glucose reference range applies only to samples taken after fasting for at least 8 hours.  Glucose, capillary     Status: Abnormal   Collection Time: 10/24/21  4:02 PM  Result Value Ref Range   Glucose-Capillary 217 (H) 70 - 99 mg/dL    Comment: Glucose reference range applies only to samples taken after fasting for at least 8 hours.  Glucose, capillary     Status: Abnormal   Collection Time: 10/24/21  7:26 PM  Result  Value Ref Range   Glucose-Capillary 188 (H) 70 - 99 mg/dL    Comment: Glucose reference range applies only to samples taken after fasting for at least 8 hours.  Glucose, capillary     Status: Abnormal   Collection Time: 10/25/21 12:02 AM  Result Value Ref Range   Glucose-Capillary 168 (H) 70 - 99 mg/dL    Comment: Glucose reference range applies only to samples taken after fasting for at least 8 hours.  Glucose, capillary     Status: Abnormal   Collection Time: 10/25/21  5:05 AM  Result Value Ref Range   Glucose-Capillary 230 (H) 70 - 99 mg/dL    Comment: Glucose reference range applies only to samples taken after fasting for at least 8 hours.  Basic metabolic panel     Status: Abnormal   Collection Time: 10/25/21  6:20 AM  Result Value Ref Range   Sodium 136 135 - 145 mmol/L   Potassium 3.7 3.5 - 5.1 mmol/L   Chloride 96 (L) 98 - 111 mmol/L   CO2 30 22 - 32 mmol/L   Glucose, Bld 230 (H) 70 - 99 mg/dL    Comment: Glucose reference range applies only to samples taken after fasting for at least 8 hours.   BUN 14 6 - 20 mg/dL   Creatinine, Ser 0.83 0.44 - 1.00 mg/dL   Calcium 9.7 8.9 - 10.3 mg/dL   GFR, Estimated >60 >60 mL/min    Comment: (NOTE) Calculated using the CKD-EPI Creatinine Equation (2021)    Anion gap 10 5 - 15    Comment: Performed at [facility] [address] [place], [place] [address]  Magnesium     Status: None   Collection Time: 10/25/21  6:20 AM  Result Value Ref Range   Magnesium 2.0 1.7 - 2.4 mg/dL    Comment: Performed at [facility] [address], [place], [place] [address]  Glucose, capillary     Status: Abnormal   Collection Time: 10/25/21  7:38 AM  Result Value Ref Range   Glucose-Capillary 182 (H) 70 - 99 mg/dL    Comment: Glucose reference range applies only to samples taken after fasting for at least 8 hours.  Glucose, capillary     Status: Abnormal   Collection Time: 10/25/21 11:42 AM  Result Value Ref Range   Glucose-Capillary  142 (H) 70 - 99 mg/dL    Comment: Glucose reference range applies only to samples taken after fasting for at least 8 hours.  Glucose, capillary     Status: Abnormal   Collection Time: 10/25/21  4:00 PM  Result Value Ref Range   Glucose-Capillary 166 (H) 70 - 99 mg/dL    Comment: Glucose reference range applies only to samples taken after fasting for at least 8 hours.  Glucose,  capillary     Status: Abnormal   Collection Time: 10/25/21  8:27 PM  Result Value Ref Range   Glucose-Capillary 155 (H) 70 - 99 mg/dL    Comment: Glucose reference range applies only to samples taken after fasting for at least 8 hours.  Glucose, capillary     Status: Abnormal   Collection Time: 10/26/21  6:20 AM  Result Value Ref Range   Glucose-Capillary 195 (H) 70 - 99 mg/dL    Comment: Glucose reference range applies only to samples taken after fasting for at least 8 hours.   DG Abd 1 View  Result Date: 10/25/2021 CLINICAL DATA:  Nausea and vomiting EXAM: ABDOMEN - 1 VIEW COMPARISON:  10/21/2021 FINDINGS: Contrast in the stomach and jejunum from swallowing function study today. Normal bowel gas pattern. No bowel obstruction or bowel edema. Pedicle screw fusion on the left at L3-4. Left lateral plate and screw fusion L3-4. No acute skeletal abnormality. IMPRESSION: Contrast in the stomach and jejunum from swallowing function study. Normal bowel gas pattern. Electronically Signed   By: Franchot Gallo M.D.   On: 10/25/2021 13:42   DG Swallowing Func-Speech Pathology  Result Date: 10/25/2021 Table formatting from the original result was not included. Objective Swallowing Evaluation: Type of Study: MBS-Modified Barium Swallow Study  Patient Details Name: [name] MRN: [id number] Date of Birth: 04-Apr-1966 Today's Date: 10/25/2021 Time: SLP Start Time (ACUTE ONLY): 87 -SLP Stop Time (ACUTE ONLY): 1320 SLP Time Calculation (min) (ACUTE ONLY): 20 min Past Medical History: Past Medical History: Diagnosis Date  Asthma    Diabetes mellitus without complication (HCC)   GERD (gastroesophageal reflux disease)   Heart murmur   from childhood rheumatic fever, per patient  Hyperlipidemia   Pneumonia 12/14  Wears partial dentures   top and bottom partials Past Surgical History: Past Surgical History: Procedure Laterality Date  ANTERIOR LAT LUMBAR FUSION Left 07/31/2013  Procedure: ANTERIOR LATERAL LUMBAR FUSION 1 LEVEL/Left sided lumbar 3-4 lateral interbody fusion with instrumentation and allograft.;  Surgeon: Sinclair Ship, MD;  Location: [facility];  Service: Orthopedics;  Laterality: Left;  Left sided lumbar 3-4 lateral interbody fusion with instrumentation and allograft.  APPENDECTOMY  30- 40 years  carpal tunnel    right arm  CARPAL TUNNEL RELEASE Right 12/15/2013  Procedure: RIGHT CARPAL TUNNEL RELEASE ENDOSCOPIC;  Surgeon: Jolyn Nap, MD;  Location: [facility];  Service: Orthopedics;  Laterality: Right;  LATERAL EPICONDYLE RELEASE Right 12/15/2013  Procedure: RIGHT ULNAR NEUROPLASTY AT ELBOW ;  Surgeon: Jolyn Nap, MD;  Location: [facility];  Service: Orthopedics;  Laterality: Right;  LUMBAR FUSION   HPI: Pt is a 56 y.o. female admitted 09/30/21 after becoming minimally responsive after doing cocaine with husband, required emergent intubation 12/23. ETT 12/23-12/31, reintubated 12/31-1/6, failed again; s/p trach 1/7. PMH includes asthma, DM2, HLD, PTSD, anxiety, chronic pancreatitis.  Subjective: alert  Recommendations for follow up therapy are one component of a multi-disciplinary discharge planning process, led by the attending physician.  Recommendations may be updated based on patient status, additional functional criteria and insurance authorization. Assessment / Plan / Recommendation Clinical Impressions 10/25/2021 Clinical Impression Pt presents with a very mild pharyngeal dysphagia marked by high/transient penetration of thin liquids (Penetration/Aspiration scale of 2 and 3) during the  swallow.  There was no aspiration, despite large, successive boluses and mixed solid and liquid consistencies.  Oral phase was WNL. There was excellent pharyngeal transfer with no residue post swallow.  Pt's trach was  capped during study; her voice was stronger and of clearer quality.  She was very excited about results and motivated to eat.  Recommend starting a regular diet with thin liquids. Meds whole in water.  Cortrak can be discontinued. SLP Visit Diagnosis Dysphagia, pharyngeal phase (R13.13) Attention and concentration deficit following -- Frontal lobe and executive function deficit following -- Impact on safety and function Mild aspiration risk   Treatment Recommendations 10/25/2021 Treatment Recommendations Therapy as outlined in treatment plan below   No flowsheet data found. Diet Recommendations 10/25/2021 SLP Diet Recommendations Regular solids;Thin liquid Liquid Administration via Straw Medication Administration Whole meds with liquid Compensations -- Postural Changes --   Other Recommendations 10/25/2021 Recommended Consults -- Oral Care Recommendations -- Other Recommendations -- Follow Up Recommendations Acute inpatient rehab (3hours/day) Assistance recommended at discharge -- Functional Status Assessment Patient has had a recent decline in their functional status and demonstrates the ability to make significant improvements in function in a reasonable and predictable amount of time. Frequency and Duration  10/25/2021 Speech Therapy Frequency (ACUTE ONLY) min 1 x/week Treatment Duration 1 week   Oral Phase 10/25/2021 Oral Phase WFL Oral - Pudding Teaspoon -- Oral - Pudding Cup -- Oral - Honey Teaspoon -- Oral - Honey Cup -- Oral - Nectar Teaspoon -- Oral - Nectar Cup -- Oral - Nectar Straw -- Oral - Thin Teaspoon -- Oral - Thin Cup -- Oral - Thin Straw -- Oral - Puree -- Oral - Mech Soft -- Oral - Regular -- Oral - Multi-Consistency -- Oral - Pill -- Oral Phase - Comment --  Pharyngeal Phase 10/25/2021  Pharyngeal Phase Impaired Pharyngeal- Pudding Teaspoon -- Pharyngeal -- Pharyngeal- Pudding Cup -- Pharyngeal -- Pharyngeal- Honey Teaspoon -- Pharyngeal -- Pharyngeal- Honey Cup -- Pharyngeal -- Pharyngeal- Nectar Teaspoon -- Pharyngeal -- Pharyngeal- Nectar Cup -- Pharyngeal -- Pharyngeal- Nectar Straw -- Pharyngeal -- Pharyngeal- Thin Teaspoon -- Pharyngeal -- Pharyngeal- Thin Cup -- Pharyngeal -- Pharyngeal- Thin Straw Delayed swallow initiation-pyriform sinuses;Penetration/Aspiration before swallow Pharyngeal Material enters airway, remains ABOVE vocal cords then ejected out;Material enters airway, remains ABOVE vocal cords and not ejected out Pharyngeal- Puree -- Pharyngeal -- Pharyngeal- Mechanical Soft -- Pharyngeal -- Pharyngeal- Regular -- Pharyngeal -- Pharyngeal- Multi-consistency -- Pharyngeal -- Pharyngeal- Pill -- Pharyngeal -- Pharyngeal Comment --  Cervical Esophageal Phase  10/25/2021 Cervical Esophageal Phase WFL Pudding Teaspoon -- Pudding Cup -- Honey Teaspoon -- Honey Cup -- Nectar Teaspoon -- Nectar Cup -- Nectar Straw -- Thin Teaspoon -- Thin Cup -- Thin Straw -- Puree -- Mechanical Soft -- Regular -- Multi-consistency -- Pill -- Cervical Esophageal Comment -- [name] 10/25/2021, 1:38 PM                         Medical Problem List and Plan: 1. Functional deficits secondary to acute hypoxic and hypercapnic respiratory failure.  Status post tracheostomy 10/15/2021 per Dr. Ina Homes.  Currently with #4 cuffless Shiley.  Continue capping anticipating decannulation  -patient may *** shower  -ELOS/Goals: *** 2.  Antithrombotics: -DVT/anticoagulation:  Pharmaceutical: Lovenox  -antiplatelet therapy: N/A 3. Pain Management: Oxycodone as needed 4. Mood/ICU delirium: Trazodone 50 mg nightly, Zoloft 50 mg daily, Klonopin 0.5 mg twice daily.  Psychiatry follow-up as needed  -antipsychotic agents: Zyprexa 10 mg nightly 5. Neuropsych: This patient is capable of making  decisions on her own behalf. 6. Skin/Wound Care: Routine skin checks 7. Fluids/Electrolytes/Nutrition: Routine in and outs with follow-up chemistries 8.  Acute diastolic  congestive heart failure.  Ejection fraction 65 to 70%.  Monitor for any signs of fluid overload 9.  Diabetes mellitus.  Hemoglobin A1c 9.1.  Semglee 20 units daily.  Check blood sugars before meals and at bedtime.  Diabetic teaching 10.  Hypertension.  Lisinopril 20 mg daily. 11.  Hyperlipidemia.  Crestor 12.  Anemia of chronic disease.  Follow-up CBC 13.  Polysubstance use.  Urine drug screen positive cocaine.  Counseling Cathlyn Parsons, PA-C 10/26/2021

## 2021-10-26 NOTE — TOC Transition Note (Signed)
Transition of Care [facility]) - CM/SW Discharge Note   Patient Details  Name: [name] MRN: [id number] Date of Birth: April 08, 1966  Transition of Care [facility] CM/SW Contact:  Zenon Mayo, RN Phone Number: 10/26/2021, 11:31 AM   Clinical Narrative:    NCM spoke with patient at the bedside, she wants to go home, per physical therapist she can go home with HHPT.  NCM asked if could set her up with HHPT. She states no , she is going to [facility] tomorrow for her MD apt and she will get therapy there.  NCM informed her that do not think they do therapy there, she states yes they do and that's where she will get her therapy.  NCM asked if she has oxygen at home. She states yes she is on 2 liters.  NCM informed her she has been on 3 liters here and patient states well I will just put it up to 3 liters and her husband went out to the car to get her oxygen tank.  She states she does not need anything.       Final next level of care: Home/Self Care Barriers to Discharge: No Barriers Identified   Patient Goals and CMS Choice Patient states their goals for this hospitalization and ongoing recovery are:: return home with family CMS Medicare.gov Compare Post Acute Care list provided to::  (Doesnt want list right now prefers to wait til patient is doing better and able to wean off vent) Choice offered to / list presented to : NA  Discharge Placement                       Discharge Plan and Services In-house Referral: NA Discharge Planning Services: CM Consult Post Acute Care Choice: NA          DME Arranged: N/A DME Agency: NA       HH Arranged: Refused HH          Social Determinants of Health (SDOH) Interventions     Readmission Risk Interventions Readmission Risk Prevention Plan 10/26/2021 10/20/2021  Transportation Screening Complete Complete  Medication Review Press photographer) Complete Referral to Pharmacy  PCP or Specialist appointment within 3-5 days  of discharge Complete (No Data)  [place] or [place] Complete Complete  SW Recovery Care/Counseling Consult Complete Complete  Palliative Care Screening Not Applicable Not [place] Not Applicable Patient Refused  Some recent data might be hidden

## 2021-10-26 NOTE — Discharge Summary (Signed)
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 Pharyngeal- Nectar Straw -- Pharyngeal -- Pharyngeal- Thin Teaspoon -- Pharyngeal -- Pharyngeal- Thin Cup -- Pharyngeal -- Pharyngeal- Thin Straw Delayed swallow initiation-pyriform sinuses;Penetration/Aspiration before swallow Pharyngeal Material enters airway, remains ABOVE vocal cords then ejected out;Material enters airway, remains ABOVE vocal cords and not ejected out Pharyngeal- Puree -- Pharyngeal -- Pharyngeal- Mechanical Soft -- Pharyngeal -- Pharyngeal- Regular -- Pharyngeal -- Pharyngeal- Multi-consistency -- Pharyngeal -- Pharyngeal- Pill -- Pharyngeal -- Pharyngeal Comment --  Cervical Esophageal Phase  10/25/2021 Cervical Esophageal Phase WFL Pudding Teaspoon -- Pudding Cup -- Honey Teaspoon -- Honey Cup -- Nectar Teaspoon -- Nectar Cup -- Nectar Straw -- Thin Teaspoon -- Thin Cup -- Thin Straw -- Puree -- Mechanical Soft -- Regular -- Multi-consistency -- Pill -- Cervical Esophageal Comment -- [name] 10/25/2021, 1:38 PM                     ECHOCARDIOGRAM COMPLETE  Result Date: 10/01/2021     ECHOCARDIOGRAM REPORT   Patient Name:   [name] Date of Exam: 10/01/2021 Medical Rec #:  [id number]     Height:       66.0 in Accession #:    [id number]    Weight:       164.0 lb Date of Birth:  1966-02-18      BSA:          1.838 m Patient Age:    56 years      BP:           87/54 mmHg Patient Gender: F             HR:           76 bpm. Exam Location:  Inpatient Procedure: 2D Echo, Cardiac Doppler and Color Doppler Indications:    CHF  History:        Patient has no prior history of  Echocardiogram examinations.  Sonographer:    Glo Herring Referring Phys: [id number] Langford MARSHALL  Sonographer Comments: Echo performed with patient supine and on artificial respirator. Image acquisition challenging due to respiratory motion. IMPRESSIONS  1. Left ventricular ejection fraction, by estimation, is 65 to 70%. The left ventricle has normal function. The left ventricle has no regional wall motion abnormalities. There is mild left ventricular hypertrophy. Left ventricular diastolic parameters are indeterminate.  2. Right ventricular systolic function is normal. The right ventricular size is normal. Tricuspid regurgitation signal is inadequate for assessing PA pressure.  3. The mitral valve is normal in structure. No evidence of mitral valve regurgitation. No evidence of mitral stenosis.  4. The aortic valve was not well visualized. Aortic valve regurgitation is not visualized. Mild aortic valve stenosis. Vmax 2.2 m/s, MG 13mmHg, DI 0.56, AVA 1.8cm^2 FINDINGS  Left Ventricle: Left ventricular ejection fraction, by estimation, is 65 to 70%. The left ventricle has normal function. The left ventricle has no regional wall motion abnormalities. The left ventricular internal cavity size was normal in size. There is  mild left ventricular hypertrophy. Left ventricular diastolic parameters are indeterminate. Right Ventricle: The right ventricular size is normal. No increase in right ventricular wall thickness. Right ventricular  systolic function is normal. Tricuspid regurgitation signal is inadequate for assessing PA pressure. Left Atrium: Left atrial size was normal in size. Right Atrium: Right atrial size was normal in size. Pericardium: Trivial pericardial effusion is present. Mitral Valve: The mitral valve is normal in structure. No evidence of mitral valve regurgitation. No evidence of mitral valve stenosis. Tricuspid Valve: The tricuspid valve is normal in structure. Tricuspid valve regurgitation is trivial. Aortic Valve: The aortic valve was not well visualized. Aortic valve regurgitation is not visualized. Mild aortic stenosis is present. Aortic valve mean gradient measures 9.0 mmHg. Aortic valve peak gradient measures 19.0 mmHg. Aortic valve area, by VTI measures 1.76 cm. Pulmonic Valve: The pulmonic valve was not well visualized. Pulmonic valve regurgitation is not visualized. Aorta: The aortic root and ascending aorta are structurally normal, with no evidence of dilitation. IAS/Shunts: The interatrial septum was not well visualized.  LEFT VENTRICLE PLAX 2D LVIDd:         3.80 cm   Diastology LVIDs:         2.50 cm   LV e' medial:    5.22 cm/s LV PW:         1.00 cm   LV E/e' medial:  17.1 LV IVS:        1.00 cm   LV e' lateral:   8.27 cm/s LVOT diam:     2.00 cm   LV E/e' lateral: 10.8 LV SV:         72 LV SV Index:   39 LVOT Area:     3.14 cm  RIGHT VENTRICLE            IVC RV S prime:     9.68 cm/s  IVC diam: 1.90 cm LEFT ATRIUM             Index LA diam:        2.90 cm 1.58 cm/m LA Vol (A2C):   35.7 ml 19.42 ml/m LA Vol (A4C):   28.1 ml 15.29 ml/m LA Biplane Vol: 32.6 ml 17.74 ml/m  AORTIC VALVE                     PULMONIC VALVE AV Area (Vmax):    1.69 cm  PV Vmax:       0.82 m/s AV Area (Vmean):   1.93 cm      PV Peak grad:  2.7 mmHg AV Area (VTI):     1.76 cm AV Vmax:           218.00 cm/s AV Vmean:          142.000 cm/s AV VTI:            0.408 m AV Peak Grad:      19.0 mmHg AV Mean Grad:      9.0 mmHg LVOT  Vmax:         117.00 cm/s LVOT Vmean:        87.300 cm/s LVOT VTI:          0.229 m LVOT/AV VTI ratio: 0.56  AORTA Ao Root diam: 2.50 cm Ao Asc diam:  2.50 cm MITRAL VALVE MV Area (PHT): 2.93 cm    SHUNTS MV Decel Time: 259 msec    Systemic VTI:  0.23 m MV E velocity: 89.50 cm/s  Systemic Diam: 2.00 cm MV A velocity: 85.30 cm/s MV E/A ratio:  1.05 Oswaldo Milian MD Electronically signed by Oswaldo Milian MD Signature Date/Time: 10/01/2021/2:14:20 PM    Final    VAS [place] LOWER EXTREMITY VENOUS (DVT)  Result Date: 10/14/2021  Lower Venous DVT Study Patient Name:  [name]  Date of Exam:   10/14/2021 Medical Rec #: [id number]      Accession #:    [id number] Date of Birth: 13-Apr-1966       Patient Gender: F Patient Age:   63 years Exam Location:  [facility] Procedure:      VAS [place] LOWER EXTREMITY VENOUS (DVT) Referring Phys: Ina Homes --------------------------------------------------------------------------------  Indications: Edema.  Comparison Study: no prior Performing Technologist: Archie Patten RVS  Examination Guidelines: A complete evaluation includes B-mode imaging, spectral Doppler, color Doppler, and power Doppler as needed of all accessible portions of each vessel. Bilateral testing is considered an integral part of a complete examination. Limited examinations for reoccurring indications may be performed as noted. The reflux portion of the exam is performed with the patient in reverse Trendelenburg.  +---------+---------------+---------+-----------+----------+--------------+  RIGHT     Compressibility Phasicity Spontaneity Properties Thrombus Aging  +---------+---------------+---------+-----------+----------+--------------+  CFV       Full            Yes       Yes                                    +---------+---------------+---------+-----------+----------+--------------+  SFJ       Full                                                              +---------+---------------+---------+-----------+----------+--------------+  FV Prox   Full                                                             +---------+---------------+---------+-----------+----------+--------------+  FV Mid    Full                                                             +---------+---------------+---------+-----------+----------+--------------+  FV Distal Full                                                             +---------+---------------+---------+-----------+----------+--------------+  PFV       Full                                                             +---------+---------------+---------+-----------+----------+--------------+  POP       Full            Yes       Yes                                    +---------+---------------+---------+-----------+----------+--------------+  PTV       Full                                                             +---------+---------------+---------+-----------+----------+--------------+  PERO      Full                                                             +---------+---------------+---------+-----------+----------+--------------+   +---------+---------------+---------+-----------+----------+--------------+  LEFT      Compressibility Phasicity Spontaneity Properties Thrombus Aging  +---------+---------------+---------+-----------+----------+--------------+  CFV       Full            Yes       Yes                                    +---------+---------------+---------+-----------+----------+--------------+  SFJ       Full                                                             +---------+---------------+---------+-----------+----------+--------------+  FV Prox   Full                                                             +---------+---------------+---------+-----------+----------+--------------+  FV Mid    Full                                                              +---------+---------------+---------+-----------+----------+--------------+  FV Distal Full                                                             +---------+---------------+---------+-----------+----------+--------------+  PFV       Full                                                             +---------+---------------+---------+-----------+----------+--------------+  POP       Full            Yes       Yes                                    +---------+---------------+---------+-----------+----------+--------------+  PTV       Full                                                             +---------+---------------+---------+-----------+----------+--------------+  PERO      Full                                                             +---------+---------------+---------+-----------+----------+--------------+     Summary: BILATERAL: - No evidence of deep vein thrombosis seen in the lower extremities, bilaterally. -No evidence of popliteal cyst, bilaterally.   *See table(s) above for measurements and observations. Electronically signed by Orlie Pollen on 10/14/2021 at 6:57:06 PM.    Final    [place] EKG SITE RITE  Result Date: 10/04/2021 If Site Rite image not attached, placement could not be confirmed due to current cardiac rhythm.   Microbiology: Recent Results (from the past 240 hour(s))  Culture, blood (routine x 2)     Status: None   Collection Time: 10/18/21 11:43 AM   Specimen: Left Antecubital; Blood  Result Value Ref Range Status   Specimen Description LEFT ANTECUBITAL  Final   Special Requests   Final    BOTTLES DRAWN AEROBIC AND ANAEROBIC Blood Culture adequate volume   Culture   Final    NO GROWTH 5 DAYS Performed at [facility], [address], [place], [place] [address]    Report Status 10/23/2021 FINAL  Final  Culture, blood (routine x 2)     Status: None   Collection Time: 10/18/21 11:45 AM   Specimen: BLOOD LEFT HAND  Result Value Ref Range Status   Specimen  Description BLOOD LEFT HAND  Final   Special Requests   Final    BOTTLES DRAWN AEROBIC AND ANAEROBIC Blood Culture adequate volume   Culture   Final    NO GROWTH 5 DAYS Performed at [facility], [place] [address], [place], [place]  [address]    Report Status 10/23/2021 FINAL  Final     Labs: Basic Metabolic Panel: Recent Labs  Lab 10/22/21 0330 10/22/21 0444 10/23/21 0354 10/24/21 0413 10/25/21 0620 10/26/21 0707  NA 139 141 137 136 136 134*  K 3.3* 3.6 3.8 4.5 3.7 3.5  CL 106  --  104 105 96* 95*  CO2 25  --  23 26 30 30   GLUCOSE 110*  --  162* 212* 230* 168*  BUN 19  --  17 14 14 15   CREATININE 0.66  --  0.83 0.73 0.83 0.96  CALCIUM 9.0  --  9.2 9.1 9.7 9.4  MG  --   --  1.9  --  2.0 2.0   Liver Function Tests: Recent Labs  Lab 10/26/21 0707  AST 37  ALT 54*  ALKPHOS 66  BILITOT 0.5  PROT 7.4  ALBUMIN 2.8*   No results for input(s): LIPASE, AMYLASE in the last 168 hours. No results for input(s): AMMONIA in the last 168 hours. CBC: Recent Labs  Lab 10/21/21 0410 10/22/21 0330 10/22/21 0444 10/23/21 0354 10/24/21 0413 10/26/21 0707  WBC 15.6* 14.7*  --  14.9* 13.6* 16.5*  NEUTROABS  --   --   --   --   --  8.1*  HGB 9.3* 8.8* 8.8* 9.4* 9.6* 11.0*  HCT 29.4* 27.9* 26.0* 28.6* 29.5* 33.9*  MCV 93.6 92.4  --  91.7 92.8 92.6  PLT 335 311  --  311 326 369   Cardiac Enzymes: No results for input(s): CKTOTAL, CKMB, CKMBINDEX, TROPONINI in the last 168 hours. BNP: BNP (last 3 results) Recent Labs    09/30/21 2028  BNP 31.1    ProBNP (last 3 results) No results for input(s): PROBNP in the last 8760 hours.  CBG: Recent Labs  Lab 10/25/21 0738 10/25/21 1142 10/25/21 1600 10/25/21 2027 10/26/21 0620  GLUCAP 182* 142* 166* 155* 195*       Signed:  Domenic Polite MD.  [facility] 10/26/2021, 11:08 AM

## 2021-10-26 NOTE — TOC Initial Note (Signed)
Transition of Care [facility]) - Initial/Assessment Note    Patient Details  Name: [name] MRN: [id number] Date of Birth: 10-12-1965  Transition of [facility] CM/SW Contact:    Zenon Mayo, RN Phone Number: 10/26/2021, 11:28 AM  Clinical Narrative:                 NCM spoke with patient at the bedside, she wants to go home, per physical therapist she can go home with HHPT.  NCM asked if could set her up with HHPT. She states no , she is going to [facility] tomorrow for her MD apt and she will get therapy there.  NCM informed her that do not think they do therapy there, she states yes they do and that's where she will get her therapy.  NCM asked if she has oxygen at home. She states yes she is on 2 liters.  NCM informed her she has been on 3 liters here and patient states well I will just put it up to 3 liters and her husband went out to the car to get her oxygen tank.  She states she does not need anything.    Expected Discharge Plan: Home/Self Care Barriers to Discharge: No Barriers Identified   Patient Goals and CMS Choice Patient states their goals for this hospitalization and ongoing recovery are:: return home with family CMS Medicare.gov Compare Post Acute Care list provided to::  (Doesnt want list right now prefers to wait til patient is doing better and able to wean off vent) Choice offered to / list presented to : NA  Expected Discharge Plan and Services Expected Discharge Plan: Home/Self Care In-house Referral: NA Discharge Planning Services: CM Consult Post Acute Care Choice: NA Living arrangements for the past 2 months: Single Family Home Expected Discharge Date: 10/26/21               DME Arranged: N/A DME Agency: NA       HH Arranged: Refused HH          Prior Living Arrangements/Services Living arrangements for the past 2 months: Single Family Home Lives with:: Spouse Patient language and need for interpreter reviewed:: Yes Do you feel  safe going back to the place where you live?: Yes      Need for Family Participation in Patient Care: Yes (Comment) Care giver support system in place?: Yes (comment) Current home services:  (n/a) Criminal Activity/Legal Involvement Pertinent to Current Situation/Hospitalization: No - Comment as needed  Activities of Daily Living      Permission Sought/Granted   Permission granted to share information with : No              Emotional Assessment Appearance:: Appears stated age Attitude/Demeanor/Rapport: Engaged Affect (typically observed): Appropriate Orientation: : Oriented to Situation, Oriented to  Time, Oriented to Place, Oriented to Self Alcohol / Substance Use: Illicit Drugs Psych Involvement: No (comment)  Admission diagnosis:  Acute pulmonary edema ([place]) [J81.0] Cocaine abuse ([place]) [F14.10] Acute respiratory failure with hypoxia ([place]) [J96.01] Acute hypoxemic respiratory failure ([place]) [J96.01] Patient Active Problem List   Diagnosis Date Noted   Malnutrition of moderate degree 10/21/2021   Acute respiratory failure with hypoxia ([place]) 09/30/2021   Chronic pancreatitis, unspecified pancreatitis type ([place]) 04/20/2021   Chronic midline low back pain without sciatica 05/15/2018   Uncontrolled type 2 diabetes mellitus with hyperglycemia ([place]) 05/14/2018   Encounter for long-term (current) use of insulin ([place]) 05/14/2018   PTSD (post-traumatic stress disorder) 05/14/2018  Essential hypertension, benign 05/14/2018   Hyperlipidemia 05/14/2018   Mild intermittent asthma 05/14/2018   HCAP (healthcare-associated pneumonia) 10/05/2013   Flu-like symptoms 10/05/2013   Asthma exacerbation 10/05/2013   Diabetes mellitus ([place]) 10/05/2013   GERD (gastroesophageal reflux disease) 10/05/2013   Tobacco use 10/05/2013   PCP:  [facility]:   [facility] [place], [place] [address] [place] [address] Phone: [phone] Fax: [phone]     Social Determinants of Health (SDOH) Interventions    Readmission Risk Interventions Readmission Risk Prevention Plan 10/26/2021 10/20/2021  Transportation Screening Complete Complete  Medication Review (RN Care Manager) Complete Referral to Pharmacy  PCP or Specialist appointment within 3-5 days of discharge Complete (No Data)  [place] or [place] Complete Complete  SW Recovery Care/Counseling Consult Complete Complete  Palliative Care Screening Not Applicable Not [place] Not Applicable Patient Refused  Some recent data might be hidden

## 2021-10-26 NOTE — Progress Notes (Signed)
RT removed trach MD order. Pt currently on 4L. Vitals stable at this time. RT will continue to monitor.

## 2021-10-26 NOTE — Progress Notes (Signed)
Inpatient Rehabilitation Admissions Coordinator   Notified by staff and met with patient and spouse that she prefers to discharge home with [facility]. We will sign off .  Danne Baxter, RN, MSN Rehab Admissions Coordinator [phone] 10/26/2021 11:40 AM

## 2021-10-26 NOTE — Progress Notes (Incomplete)
° °  NAME:  [name], MRN:  [id number], DOB:  Nov 05, 1965, LOS: 86 ADMISSION DATE:  09/30/2021, CONSULTATION DATE: 09/30/2021 REFERRING MD: ED provider, CHIEF COMPLAINT: Acute hypoxic respiratory failure  History of Present Illness:  56 yo black female with pmh asthma, dm2, hyperlipidemia, PTSD/anxiety and chronic pancreatitis presented via EMS on cpap machine with sats in the mid 60's. Pt is intubated and sedated so ROS and complete history are unobtainable at this time. All history is obtained from ED and chart review.    Per report pt was at home partaking in cocaine with her husband when she began having sudden and severe sob. She was altered, unable to hold herself up in bed, minimally responsive. She had minimal air movement while on cpap and decision was made to emergently intubate pt. CXR revealed pulmonary edema. BP was noted to be extremely elevated >664 systolic. She was started on nitro infusion.    CCM was consulted for admission 2/2 pt's intubated status.   Pertinent  Medical History  DM2 Asthma Hyperlipidemia PTSD, anxiety Chronic pancreatitis  Significant Hospital Events: Including procedures, antibiotic start and stop dates in addition to other pertinent events   12/23: intubated 12/24: febrile, abx started for CAP 12/25 struggled with sedation - failed ketamine, precedex, dilaudid. Febrile and cultured 12/27 on Dilaudid and Versed drip 12/29 failed weaning this morning with tachypnea 12/31 was extubated 1230, failed extubation within an hour with bronchospasms, stridorous breathing. 01/01 started on empirical antibiotics with vancomycin and cefepime for HCAP 01/02 MRSA screen negative, vancomycin discontinued 01/04 Hypotensive episode, started on Levophed 01/06 extubated to BiPAP, failed 01/07 tracheostomy 01/13 Placed on trach collar  Interim History / Subjective:  Still wants to go home. Tolerating 4-0 cuffless shiley,  Objective   Blood pressure (Abnormal)  99/58, pulse 90, temperature 98.9 F (37.2 C), temperature source Oral, resp. rate 18, height 5\' 6"  (1.676 m), weight 73.2 kg, last menstrual period 03/07/2012, SpO2 90 %.    FiO2 (%):  [28 %] 28 %   Intake/Output Summary (Last 24 hours) at 10/26/2021 0726 Last data filed at 10/25/2021 2000 Gross per 24 hour  Intake 717 ml  Output no documentation  Net 717 ml   Filed Weights   10/24/21 0416 10/25/21 0507 10/26/21 0420  Weight: 71.9 kg 72.4 kg 73.2 kg    Examination: No distress Minimal secretions and strong cough Following commands x 4 Ext without edema Anxious  Resolved Hospital Problem list   Septic shock AKI Hypertensive crisis 2/2 cocaine use Hypotension  Assessment & Plan:  Acute hypoxemic hypercarbic respiratory failure Community-acquired pneumonia Acute pulmonary edema Leukocytosis Diastolic heart failure ICU delirium History of polysubstance abuse  PTSD T2DM, uncontrolled (A1c 9.1) HTN  Trach dependence   - Cap trach today - If doing well tomorrow am will decannulate  Remainder of care per primary  Erskine Emery MD PCCM

## 2021-10-26 NOTE — Progress Notes (Signed)
° °  NAME:  [name], MRN:  [id number], DOB:  13-Mar-1966, LOS: 83 ADMISSION DATE:  09/30/2021, CONSULTATION DATE: 09/30/2021 REFERRING MD: ED provider, CHIEF COMPLAINT: Acute hypoxic respiratory failure  History of Present Illness:  56 yo black female with pmh asthma, dm2, hyperlipidemia, PTSD/anxiety and chronic pancreatitis presented via EMS on cpap machine with sats in the mid 60's. Pt is intubated and sedated so ROS and complete history are unobtainable at this time. All history is obtained from ED and chart review.    Per report pt was at home partaking in cocaine with her husband when she began having sudden and severe sob. She was altered, unable to hold herself up in bed, minimally responsive. She had minimal air movement while on cpap and decision was made to emergently intubate pt. CXR revealed pulmonary edema. BP was noted to be extremely elevated >409 systolic. She was started on nitro infusion.    CCM was consulted for admission 2/2 pt's intubated status.   Pertinent  Medical History  DM2 Asthma Hyperlipidemia PTSD, anxiety Chronic pancreatitis  Significant Hospital Events: Including procedures, antibiotic start and stop dates in addition to other pertinent events   12/23: intubated 12/24: febrile, abx started for CAP 12/25 struggled with sedation - failed ketamine, precedex, dilaudid. Febrile and cultured 12/27 on Dilaudid and Versed drip 12/29 failed weaning this morning with tachypnea 12/31 was extubated 1230, failed extubation within an hour with bronchospasms, stridorous breathing. 01/01 started on empirical antibiotics with vancomycin and cefepime for HCAP 01/02 MRSA screen negative, vancomycin discontinued 01/04 Hypotensive episode, started on Levophed 01/06 extubated to BiPAP, failed 01/07 tracheostomy 01/13 Placed on trach collar  Interim History / Subjective:  Doing well with capped trach. Wants to go home.  Objective   Blood pressure 121/72, pulse 87,  temperature 98.5 F (36.9 C), temperature source Oral, resp. rate 17, height 5\' 6"  (1.676 m), weight 73.2 kg, last menstrual period 03/07/2012, SpO2 96 %.    FiO2 (%):  [28 %] 28 %   Intake/Output Summary (Last 24 hours) at 10/26/2021 0754 Last data filed at 10/25/2021 2000 Gross per 24 hour  Intake 717 ml  Output --  Net 717 ml    Filed Weights   10/24/21 0416 10/25/21 0507 10/26/21 0420  Weight: 71.9 kg 72.4 kg 73.2 kg    Examination: No distress Strong cough/voice Aox3 Eating breakfast  WBC up slightly  Resolved Hospital Problem list   Septic shock AKI Hypertensive crisis 2/2 cocaine use Hypotension  Assessment & Plan:  Acute hypoxemic hypercarbic respiratory failure Community-acquired pneumonia Acute pulmonary edema Leukocytosis Diastolic heart failure ICU delirium History of polysubstance abuse  PTSD T2DM, uncontrolled (A1c 9.1) HTN  - Decannulation, pressure to dressing during speech and cough - Change dressing daily - If does not close within 3 weeks, ENT referral - Dispo per primary, PCCM available PRN  Remainder of care per primary  Erskine Emery MD PCCM

## 2021-10-26 NOTE — Progress Notes (Signed)
SATURATION QUALIFICATIONS: (This note is used to comply with regulatory documentation for home oxygen)  Patient Saturations on Room Air at Rest = 89%  Patient Saturations on Room Air while Ambulating = N/A  Patient Saturations on 2 Liters of oxygen while Ambulating = 95%  Please briefly explain why patient needs home oxygen: Pt desats to 89% on RA at rest, requiring 2L of supplemental O2 to maintain sats >/= 95% with mobility.   Moishe Spice, PT, DPT Acute Rehabilitation Services  Pager: [phone] Office: [phone]

## 2021-10-26 NOTE — Progress Notes (Addendum)
Physical Therapy Treatment Patient Details Name: [name] MRN: [id number] DOB: 1966/10/09 Today's Date: 10/26/2021   History of Present Illness Pt is a 56 y.o. female admitted 09/30/21 after becoming minimally responsive after doing cocaine with husband, required emergent intubation 12/23. ETT 12/23-12/31, reintubated 12/31-1/6, failed again; s/p trach 1/7. PMH includes asthma, DM2, HLD, PTSD, anxiety, chronic pancreatitis.    PT Comments    Pt is A&Ox4 with improved cognitive status this date. She demonstrates improved memory and problem-solving capabilities, but does still answer some questions with questionable comprehension. Her balance and endurance are also much improved, as she was able to ambulate up to about 300 ft today, progressing from using a RW to no UE support. However, she is still at risk for falls, displaying LOB bouts when ambulating without UE support, needing up to minA to prevent a fall. Educated pt and family on PT recommendation to use a RW for improved stability and safety at d/c. Educated pt and family to guard her when she is upright and when on stairs and to not let her leave the home alone or drive yet, they verbalized understanding. Pt and her family requesting for her to d/c home today, RN and MD made aware. Updated d/c recs to HHPT due to her significant, quick progress and her 24/7 available support at home. Will continue to follow acutely. Of note, MMT scores of 4+ to 5 bil grossly symmetrical in upper and lower extremities.   Recommendations for follow up therapy are one component of a multi-disciplinary discharge planning process, led by the attending physician.  Recommendations may be updated based on patient status, additional functional criteria and insurance authorization.  Follow Up Recommendations  Home health PT     Assistance Recommended at Discharge Frequent or constant Supervision/Assistance  Patient can return home with the following Help with  stairs or ramp for entrance;A little help with walking and/or transfers;A little help with bathing/dressing/bathroom;Assistance with cooking/housework;Direct supervision/assist for financial management;Direct supervision/assist for medications management;Assist for transportation   Equipment Recommendations  Rolling walker (2 wheels)    Recommendations for Other Services       Precautions / Restrictions Precautions Precautions: Fall;Other (comment) Precaution Comments: trach (covered now on [place]) Restrictions Weight Bearing Restrictions: No     Mobility  Bed Mobility               General bed mobility comments: pt sitting EOB upon PT arrival    Transfers Overall transfer level: Needs assistance Equipment used: Rolling walker (2 wheels) Transfers: Sit to/from Stand Sit to Stand: Min guard           General transfer comment: Cues to push up from EOB, min guard assist for safety with transfer to stand from EOB to RW.    Ambulation/Gait Ambulation/Gait assistance: +2 safety/equipment, Min guard, Min assist Gait Distance (Feet): 300 Feet Assistive device: Rolling walker (2 wheels), None, 1 person hand held assist Gait Pattern/deviations: Step-through pattern, Decreased stride length, Narrow base of support, Staggering right Gait velocity: reduced Gait velocity interpretation: <1.8 ft/sec, indicate of risk for recurrent falls   General Gait Details: Pt wtill with slightly narrow stance and feet externally rotated (which they report is her normal). Pt able to ambulate with RW with min guard assist without LOB. When ambulating without UE support or with 1 HHA pt displays intermittent staggering, primarily when she accidently crosses one leg over the other or sways posteriorly standing on one foot, needing up to minA to prevent LOB. Educated pt  and family for her to start with using a RW and have close supervision whenever upright due to risk for falls. Slight R knee buckling  after initial 200 ft or so.   Stairs Stairs: Yes Stairs assistance: Min guard Stair Management: One rail Right, One rail Left, Step to pattern, Forwards Number of Stairs: 5 General stair comments: Ascends with L rail without LOB and descends with R rail with noted slight knee buckling descending, min guard for safety. Educated pt and family to assist and guard her on steps due to her knees giving.   Wheelchair Mobility    Modified Rankin (Stroke Patients Only)       Balance Overall balance assessment: Needs assistance Sitting-balance support: Feet supported, No upper extremity supported Sitting balance-Leahy Scale: Good     Standing balance support: During functional activity, No upper extremity supported, Single extremity supported, Bilateral upper extremity supported Standing balance-Leahy Scale: Fair Standing balance comment: Able to ambulate without UE support but requires up to minA due to LOB bouts, min guard using RW.                            Cognition Arousal/Alertness: Awake/alert Behavior During Therapy: WFL for tasks assessed/performed Overall Cognitive Status: Impaired/Different from baseline Area of Impairment: Awareness, Safety/judgement                         Safety/Judgement: Decreased awareness of safety, Decreased awareness of deficits Awareness: Emergent   General Comments: Pt A&Ox4, much improved cognitive status today. Pt intermittently repeating answers to questions or not fully comprehending questions asked with odd answers, but overall follows commands appropriately. Pt able to recall her room number and use signs to pathfind her way back to her room without assistance or cues. Some mild decreased awareness into her deficits and safety concerns as she tends to have an excuse or talk over PT, but husband appears to do the same. Pt denies cocaine use.        Exercises      General Comments General comments (skin integrity,  edema, etc.): SpO2 89% at rest on RA, >/= 95% on 2L with mobility      Pertinent Vitals/Pain Pain Assessment Pain Assessment: Faces Faces Pain Scale: Hurts a little bit Pain Location: generalized with mobility Pain Descriptors / Indicators: Discomfort Pain Intervention(s): Monitored during session, Limited activity within patient's tolerance    Home Living                          Prior Function            PT Goals (current goals can now be found in the care plan section) Acute Rehab PT Goals Patient Stated Goal: to go home today PT Goal Formulation: With patient/family Time For Goal Achievement: 10/31/21 Potential to Achieve Goals: Good Progress towards PT goals: Progressing toward goals    Frequency    Min 4X/week      PT Plan Discharge plan needs to be updated    Co-evaluation              AM-PAC PT "6 Clicks" Mobility   Outcome Measure  Help needed turning from your back to your side while in a flat bed without using bedrails?: A Little Help needed moving from lying on your back to sitting on the side of a flat bed without using bedrails?: A Little  Help needed moving to and from a bed to a chair (including a wheelchair)?: A Little Help needed standing up from a chair using your arms (e.g., wheelchair or bedside chair)?: A Little Help needed to walk in hospital room?: A Little Help needed climbing 3-5 steps with a railing? : A Little 6 Click Score: 18    End of Session Equipment Utilized During Treatment: Oxygen;Gait belt Activity Tolerance: Patient tolerated treatment well Patient left: in chair;with chair alarm set;with call bell/phone within reach;with family/visitor present Nurse Communication: Mobility status PT Visit Diagnosis: Other abnormalities of gait and mobility (R26.89);Muscle weakness (generalized) (M62.81)     Time: 1694-5038 PT Time Calculation (min) (ACUTE ONLY): 29 min  Charges:  $Gait Training: 8-22 mins $Therapeutic  Activity: 8-22 mins                     Moishe Spice, PT, DPT Acute Rehabilitation Services  Pager: [phone] Office: [place] 10/26/2021, 11:14 AM

## 2021-10-27 ENCOUNTER — Ambulatory Visit: Payer: Medicare HMO | Admitting: Hematology and Oncology

## 2021-10-27 NOTE — Progress Notes (Deleted)
[place] NOTE  Patient Care Team: [facility] [place] as PCP - General  CHIEF COMPLAINTS/PURPOSE OF CONSULTATION:  Leukocytosis surveillance.  ASSESSMENT & PLAN:  This is a very pleasant 56 year old female patient with past medical history significant for type 2 diabetes mellitus, asthma and dyslipidemia referred to hematology for evaluation of leukocytosis and lymphocytosis. During our last visit, I recommended that she consider MPN work up and flow cytometry given lymphocytosis and leukocytosis. Flow unremarkable MPN work up negative. We recommended surveillance, hence she is here for FU. She is doing well today, no complaints at all except for weight gain. She also had a left breast biopsy recently, we dont have the results, but patient mentioned that the result is benign. PE today no concerns Repeat labs today  Plan  RTC in 6 months.    HISTORY OF PRESENTING ILLNESS:   [name] 56 y.o. female is here because of persistent leukocytosis.  This is a very pleasant 56 year old female patient with past medical history significant for asthma, type 2 diabetes mellitus, dyslipidemia referred to hematology for evaluation of leukocytosis.    Interval History  Since last visit, patient has been doing really well.  No complaints today. She had a mammogram and left breast biopsy, results benign according to patient. No change in breathing, bowel or urinary habits No new neurological complaints. Rest of the pertinent 10 point ROS reviewed and negative  MEDICAL HISTORY:  Past Medical History:  Diagnosis Date   Asthma    Diabetes mellitus without complication (HCC)    GERD (gastroesophageal reflux disease)    Heart murmur    from childhood rheumatic fever, per patient   Hyperlipidemia    Pneumonia 12/14   Wears partial dentures    top and bottom partials    SURGICAL HISTORY: Past Surgical History:  Procedure Laterality Date   ANTERIOR LAT  LUMBAR FUSION Left 07/31/2013   Procedure: ANTERIOR LATERAL LUMBAR FUSION 1 LEVEL/Left sided lumbar 3-4 lateral interbody fusion with instrumentation and allograft.;  Surgeon: Sinclair Ship, MD;  Location: [place];  Service: Orthopedics;  Laterality: Left;  Left sided lumbar 3-4 lateral interbody fusion with instrumentation and allograft.   APPENDECTOMY  30- 40 years   carpal tunnel     right arm   CARPAL TUNNEL RELEASE Right 12/15/2013   Procedure: RIGHT CARPAL TUNNEL RELEASE ENDOSCOPIC;  Surgeon: Jolyn Nap, MD;  Location: [place];  Service: Orthopedics;  Laterality: Right;   LATERAL EPICONDYLE RELEASE Right 12/15/2013   Procedure: RIGHT ULNAR NEUROPLASTY AT ELBOW ;  Surgeon: Jolyn Nap, MD;  Location: [place];  Service: Orthopedics;  Laterality: Right;   LUMBAR FUSION      SOCIAL HISTORY: Social History   Socioeconomic History   Marital status: Married    Spouse name: Not on file   Number of children: Not on file   Years of education: Not on file   Highest education level: Not on file  Occupational History   Not on file  Tobacco Use   Smoking status: Former    Types: Cigarettes    Quit date: 08/30/2016    Years since quitting: 5.1   Smokeless tobacco: Never  Substance and Sexual Activity   Alcohol use: No    Comment: beer everynow and then per patient   Drug use: No   Sexual activity: Not Currently    Comment: says she quit 12/14  Other Topics Concern   Not on file  Social History Narrative   ** Merged History Encounter **       Social Determinants of Health   Financial Resource Strain: Not on file  Food Insecurity: Not on file  Transportation Needs: Not on file  Physical Activity: Not on file  Stress: Not on file  Social Connections: Not on file  Intimate Partner Violence: Not on file    FAMILY HISTORY: Family History  Problem Relation Age of Onset   Diabetes Mother    Heart disease Mother     ALLERGIES:   has No Known Allergies.  MEDICATIONS:  Current Outpatient Medications  Medication Sig Dispense Refill   ACCU-CHEK AVIVA PLUS test strip USE AS DIRECTED FOR 30 DAYS  6   albuterol (PROVENTIL HFA;VENTOLIN HFA) 108 (90 Base) MCG/ACT inhaler Inhale 1-2 puffs into the lungs every 6 (six) hours as needed for wheezing or shortness of breath. 1 Inhaler 0   Cholecalciferol (VITAMIN D) 50 MCG (2000 UT) tablet Take 2,000 Units by mouth daily.     JANUMET 50-1000 MG tablet Take 1 tablet by mouth 2 (two) times daily.     LANTUS SOLOSTAR 100 UNIT/ML Solostar Pen Inject 20 Units into the skin at bedtime.     lisinopril-hydrochlorothiazide (ZESTORETIC) 20-25 MG tablet Take 1 tablet by mouth daily.     methocarbamol (ROBAXIN) 500 MG tablet Take 1 tablet (500 mg total) by mouth every 8 (eight) hours as needed for muscle spasms.  3   montelukast (SINGULAIR) 10 MG tablet Take 1 tablet (10 mg total) by mouth every evening. (Patient not taking: Reported on 09/30/2021) 90 tablet 3   oxyCODONE-acetaminophen (PERCOCET) 10-325 MG tablet Take 1 tablet by mouth 5 (five) times daily.     pantoprazole (PROTONIX) 40 MG tablet Take 40 mg by mouth daily.     Phenyleph-Doxylamine-DM-APAP (NYQUIL SEVERE COLD/FLU PO) Take 1 Dose by mouth See admin instructions. 2-3 times per day     RELION PEN NEEDLE 31G/8MM 31G X 8 MM MISC Inject 1 each into the skin daily. use as directed 100 each 2   rosuvastatin (CRESTOR) 40 MG tablet Take 40 mg by mouth every evening.     sertraline (ZOLOFT) 50 MG tablet Take 50 mg by mouth daily.     No current facility-administered medications for this visit.     PHYSICAL EXAMINATION:  ECOG PERFORMANCE STATUS: 0 - Asymptomatic  There were no vitals filed for this visit.   There were no vitals filed for this visit.   GENERAL:alert, no distress and comfortable SKIN: skin color, texture, turgor are normal, no rashes or significant lesions EYES: normal, conjunctiva are pink and non-injected,  sclera clear OROPHARYNX:no exudate, no erythema and lips, buccal mucosa, and tongue normal  NECK: supple, thyroid normal size, non-tender, without nodularity LYMPH:  no palpable lymphadenopathy in the cervical, axillary or inguinal LUNGS: clear to auscultation and percussion with normal breathing effort HEART: regular rate & rhythm and no murmurs and no lower extremity edema ABDOMEN:abdomen soft, non-tender and normal bowel sounds Musculoskeletal:no cyanosis of digits and no clubbing  PSYCH: alert & oriented x 3 with fluent speech NEURO: no focal motor/sensory deficits  LABORATORY DATA:  I have reviewed the data as listed Lab Results  Component Value Date   WBC 16.5 (H) 10/26/2021   HGB 11.0 (L) 10/26/2021   HCT 33.9 (L) 10/26/2021   MCV 92.6 10/26/2021   PLT 369 10/26/2021     Chemistry      Component Value Date/Time   NA 134 (  L) 10/26/2021 0707   NA 139 09/11/2018 1557   K 3.5 10/26/2021 0707   CL 95 (L) 10/26/2021 0707   CO2 30 10/26/2021 0707   BUN 15 10/26/2021 0707   BUN 7 09/11/2018 1557   CREATININE 0.96 10/26/2021 0707   CREATININE 1.07 (H) 04/26/2021 0908      Component Value Date/Time   CALCIUM 9.4 10/26/2021 0707   ALKPHOS 66 10/26/2021 0707   AST 37 10/26/2021 0707   AST 20 04/26/2021 0908   ALT 54 (H) 10/26/2021 0707   ALT 20 04/26/2021 0908   BILITOT 0.5 10/26/2021 0707   BILITOT <0.2 (L) 04/26/2021 0908     RADIOGRAPHIC STUDIES: I have personally reviewed the radiological images as listed and agreed with the findings in the report. DG Abd 1 View  Result Date: 10/25/2021 CLINICAL DATA:  Nausea and vomiting EXAM: ABDOMEN - 1 VIEW COMPARISON:  10/21/2021 FINDINGS: Contrast in the stomach and jejunum from swallowing function study today. Normal bowel gas pattern. No bowel obstruction or bowel edema. Pedicle screw fusion on the left at L3-4. Left lateral plate and screw fusion L3-4. No acute skeletal abnormality. IMPRESSION: Contrast in the stomach and  jejunum from swallowing function study. Normal bowel gas pattern. Electronically Signed   By: Franchot Gallo M.D.   On: 10/25/2021 13:42   DG Abd 1 View  Result Date: 10/15/2021 CLINICAL DATA:  Enteric tube placement. EXAM: ABDOMEN - 1 VIEW COMPARISON:  Abdominal x-ray dated October 12, 2021. FINDINGS: Enteric tube looped in the stomach with the tip in the distal body. Normal bowel gas pattern. IMPRESSION: Enteric tube looped in the stomach with the tip in the distal body. Electronically Signed   By: Titus Dubin M.D.   On: 10/15/2021 14:10   DG Abd 1 View  Result Date: 10/12/2021 CLINICAL DATA:  Nausea, vomiting EXAM: ABDOMEN - 1 VIEW COMPARISON:  Portable exam 1308 hours compared to 08/17/2009 FINDINGS: Tip of nasogastric tube projects over stomach. Slight gaseous distention of stomach. Bowel gas pattern normal. No bowel dilatation or bowel wall thickening. Prior lumbar fusion L3-L4. No acute osseous findings or urinary tract calcification. IMPRESSION: Slight gaseous distention of stomach despite nasogastric tube. Otherwise negative exam. Electronically Signed   By: Lavonia Dana M.D.   On: 10/12/2021 13:35   DG Chest Port 1 View  Result Date: 10/18/2021 CLINICAL DATA:  Respiratory distress. Aspiration into airway. Sequela EXAM: PORTABLE CHEST 1 VIEW COMPARISON:  Chest x-ray 10/18/2021 FINDINGS: Tracheostomy with tip terminating approximately 4 cm above the carina. Enteric tube courses below the hemidiaphragm with tip and side port collimated off view. Right PICC with tip overlying the expected region of the superior cavoatrial junction. The heart and mediastinal contours are unchanged. Left base linear atelectasis. Slightly limited evaluation of the right lung due to overlying apparatus. No focal consolidation. No pulmonary edema. No pleural effusion. No pneumothorax. No acute osseous abnormality. IMPRESSION: 1. No active disease.Slightly limited evaluation of the right lung due to overlying apparatus.  2. Lines and tubes in good position. Electronically Signed   By: Iven Finn M.D.   On: 10/18/2021 23:23   DG CHEST PORT 1 VIEW  Result Date: 10/18/2021 CLINICAL DATA:  Hypoxia, acute respiratory failure EXAM: PORTABLE CHEST 1 VIEW COMPARISON:  10/15/2021 FINDINGS: Tracheostomy and right PICC line are stable position. NG tube has been inserted extending below the hemidiaphragms into the stomach with the tip not visualized. Improvement in the bibasilar aeration with some residual basilar atelectasis. No enlarging effusion  or pneumothorax. Stable heart size and vascularity. Artifact overlies the right apex. IMPRESSION: Support apparatus in good position. Improving bibasilar aeration. Electronically Signed   By: Jerilynn Mages.  Shick M.D.   On: 10/18/2021 08:53   DG Chest Port 1 View  Result Date: 10/15/2021 CLINICAL DATA:  Respiratory failure, tracheostomy EXAM: PORTABLE CHEST 1 VIEW COMPARISON:  10/14/2021, 7:16 p.m. FINDINGS: Interval placement of tracheostomy, appliance projecting in appropriate position. Probable small layering bilateral pleural effusions and associated atelectasis or consolidation. No new or focal airspace opacity. The heart and mediastinum are unremarkable. IMPRESSION: 1. Interval placement of tracheostomy, appliance projecting in appropriate position. 2. Probable small layering bilateral pleural effusions and associated atelectasis or consolidation. No new airspace opacity. Electronically Signed   By: Delanna Ahmadi M.D.   On: 10/15/2021 12:27   DG Chest Port 1 View  Result Date: 10/14/2021 CLINICAL DATA:  Encounter for intubation EXAM: PORTABLE CHEST 1 VIEW COMPARISON:  10/14/2021 FINDINGS: Endotracheal tube is at the level of the carina directed toward the right mainstem bronchus. This could be retracted approximately 2-3 cm for optimal positioning. Right PICC line tip at the cavoatrial junction, unchanged. Bilateral lower lobe airspace opacities have worsened since prior study. Heart is  normal size. No visible effusions or pneumothorax. IMPRESSION: Endotracheal tube at the level of the carina and directed toward the right mainstem bronchus. This could be retracted 2-3 cm for optimal positioning. Worsening bilateral lower lung opacities. Electronically Signed   By: Rolm Baptise M.D.   On: 10/14/2021 19:27   DG CHEST PORT 1 VIEW  Result Date: 10/14/2021 CLINICAL DATA:  Endotracheal tube. EXAM: PORTABLE CHEST 1 VIEW COMPARISON:  10/13/2021. FINDINGS: The heart size and mediastinal contours are within normal limits. The pulmonary vasculature is mildly distended. Increased patchy airspace disease is noted at the lung bases. No effusion or pneumothorax. No acute osseous abnormality. A right PICC line and enteric tube appear stable. The endotracheal tube terminates 3.6 cm above the carina. IMPRESSION: 1. Increased airspace opacities at the lung bases, possible atelectasis or infiltrate. 2. Mildly distended pulmonary vasculature. 3. Medical devices as described above. Electronically Signed   By: [name] M.D.   On: 10/14/2021 04:40   DG CHEST PORT 1 VIEW  Result Date: 10/13/2021 CLINICAL DATA:  Intubated EXAM: PORTABLE CHEST 1 VIEW COMPARISON:  Chest radiograph 1 day prior FINDINGS: Endotracheal tube tip is approximately 4 cm from the carina. The enteric catheter tip is off the field of view. The side hole is just below the level of the GE junction. The right upper extremity PICC is in stable position terminating in the lower SVC. The cardiomediastinal silhouette is stable. Patchy opacities in the right base are again seen, slightly improved in the interim. Aeration in the left base is also improved. The pleural effusions are decreased in size. There is no pneumothorax. The bones are stable. IMPRESSION: Decreased bilateral pleural effusions with improved aeration of the lung bases. Electronically Signed   By: Valetta Mole M.D.   On: 10/13/2021 08:05   DG CHEST PORT 1 VIEW  Result Date:  10/12/2021 CLINICAL DATA:  Nausea and vomiting, intubated EXAM: PORTABLE CHEST 1 VIEW COMPARISON:  Portable exam 1318 hours compared to 10/09/2021 FINDINGS: Tip of endotracheal tube projects 4.7 cm above carina. Nasogastric tube extends into stomach. RIGHT arm PICC line tip projects over SVC. Normal heart size and mediastinal contours. Bibasilar atelectasis versus consolidation. Upper lungs clear. No pneumothorax or acute osseous findings. IMPRESSION: Bibasilar atelectasis versus pneumonia. Electronically Signed  By: Lavonia Dana M.D.   On: 10/12/2021 13:32   DG Chest Port 1 View  Result Date: 10/09/2021 CLINICAL DATA:  Hypoxia.  Intubation. EXAM: PORTABLE CHEST 1 VIEW COMPARISON:  One view chest x-ray 10/09/2021 at 4:01 a.m. FINDINGS: Heart is enlarged. Endotracheal tube terminates 1.5 cm from the carina. Side port of the NG tube is just past the GE junction. Right PICC line is stable. Increasing bibasilar airspace opacities and effusions noted. IMPRESSION: 1. Endotracheal tube terminates 1.5 cm from the carina. 2. Increasing bibasilar airspace disease and effusions, concerning for infection. Electronically Signed   By: San Morelle M.D.   On: 10/09/2021 21:25   DG Chest Port 1 View  Result Date: 10/09/2021 CLINICAL DATA:  Encounter for respiratory failure. EXAM: PORTABLE CHEST 1 VIEW COMPARISON:  10/07/2021 FINDINGS: Endotracheal tube is just below the carina and extending into the right mainstem bronchus. Right arm PICC line terminates near the superior cavoatrial junction. Nasogastric tube terminates in the left upper abdomen probably in the stomach body region. Persistent densities at the medial right lung base that have minimally changed. Persistent densities at the left lung base which have slightly decreased. Upper lungs remain clear. Heart size is within normal limits and stable. Negative for pneumothorax. IMPRESSION: 1. Endotracheal tube is extending into the right mainstem bronchus. Recommend  pulling back 2-3 cm. This was called to the ICU and discussed with ICU nurse, [place] at 8:26 a.m. on 10/09/2021. 2. Persistent bibasilar lung densities. Slightly improved aeration at the left lung base. Electronically Signed   By: Markus Daft M.D.   On: 10/09/2021 08:28   Portable Chest x-ray  Result Date: 10/07/2021 CLINICAL DATA:  Tube placement EXAM: PORTABLE CHEST 1 VIEW COMPARISON:  Radiograph 10/06/2021 FINDINGS: Endotracheal tube tip overlies the distal trachea, 1.1 cm above the carina. Orogastric tube tip overlies the stomach, side port near the GE junction. Right upper extremity PICC tip overlies the superior cavoatrial junction. Unchanged cardiomediastinal silhouette. Diffuse interstitial opacities and bibasilar consolidations small bilateral pleural effusions. No visible pneumothorax. There is no acute osseous abnormality. IMPRESSION: Endotracheal tube tip overlies the distal trachea, 1.1 cm above the carina. Recommend retraction by 2.5 cm. Orogastric tube tip overlies the stomach, side port near the GE junction. Could advance by 3.0 cm. Persistent bibasilar airspace disease and small pleural effusions with likely background of pulmonary edema. Electronically Signed   By: Maurine Simmering M.D.   On: 10/07/2021 14:28   DG CHEST PORT 1 VIEW  Result Date: 10/06/2021 CLINICAL DATA:  Respiratory failure. EXAM: PORTABLE CHEST 1 VIEW COMPARISON:  Radiographs 10/04/2021, 10/01/2021 and 05/04/2009 FINDINGS: 0903 hours. Endotracheal and enteric tubes are unchanged in position. There is a new right arm PICC which projects to the level of the mid right atrium. There are lower lung volumes with slight worsening of bibasilar airspace opacities. There are probable small bilateral pleural effusions. No evidence of pneumothorax. The heart size and mediastinal contours are stable. IMPRESSION: 1. Right arm PICC projects to the level of the mid right atrium. Additional support system unchanged. 2. Slight worsening of  bibasilar airspace opacities with probable small bilateral pleural effusions. Electronically Signed   By: Richardean Sale M.D.   On: 10/06/2021 11:06   DG Chest Port 1 View  Result Date: 10/04/2021 CLINICAL DATA:  Check endotracheal tube placement EXAM: PORTABLE CHEST 1 VIEW COMPARISON:  10/01/2021 FINDINGS: Endotracheal tube and gastric catheter are noted in satisfactory position. Cardiac shadow is stable. Persistent but improving airspace opacity is noted  in the left base. Improving aeration in the right base is noted as well. IMPRESSION: Persistent but improving airspace disease in the bases bilaterally left slightly greater than right. Electronically Signed   By: Inez Catalina M.D.   On: 10/04/2021 02:33   DG Chest Port 1 View  Result Date: 10/01/2021 CLINICAL DATA:  Encounter for pulmonary edema EXAM: PORTABLE CHEST 1 VIEW COMPARISON:  09/30/2021 FINDINGS: Endotracheal tube and NG tube are unchanged. Heart is normal size. Bilateral airspace opacities, improved on the right since prior study. Mild perihilar opacity persists on the right. Increasing left lower lobe airspace opacity. No effusions or pneumothorax. No acute bony abnormality. IMPRESSION: Improving right lung airspace disease with mild residual perihilar opacity. Increasing left lower lobe airspace opacity. Electronically Signed   By: Rolm Baptise M.D.   On: 10/01/2021 05:36   DG Chest Portable 1 View  Addendum Date: 09/30/2021   ADDENDUM REPORT: 09/30/2021 20:35 ADDENDUM: These results were called by telephone at the time of interpretation on 09/30/2021 at 8:31 pm to provider [facility] , who verbally acknowledged these results. Electronically Signed   By: Iven Finn M.D.   On: 09/30/2021 20:35   Result Date: 09/30/2021 CLINICAL DATA:  ETT placement EXAM: PORTABLE CHEST 1 VIEW COMPARISON:  Chest x-ray 09/11/2018 FINDINGS: Endotracheal tube terminates 1 cm above the carina. Enteric tube courses below the hemidiaphragm with tip  and side port overlying the gastric lumen. Tube is noted to be coiled within the gastric lumen. The heart and mediastinal contours are unchanged. Question patchy interstitial and airspace opacities. No focal consolidation. No pulmonary edema. No pleural effusion. No pneumothorax. No acute osseous abnormality. IMPRESSION: 1. Endotracheal tube terminates 1 cm above the carina. Recommend retraction by 2 cm. 2. Enteric tube within the gastric lumen coiled. Consider retracting by 5 cm. 3. Question patchy interstitial and airspace opacities. Findings may represent infection/inflammation versus pulmonary edema. Electronically Signed: By: Iven Finn M.D. On: 09/30/2021 20:29   DG Abd Portable 1V  Result Date: 10/21/2021 CLINICAL DATA:  56 year old female with enteric feeding tube placement EXAM: PORTABLE ABDOMEN - 1 VIEW COMPARISON:  10/15/2021 FINDINGS: Interval removal of gastric tube. Interval placement of weighted tip enteric tube with the tip terminating at the pylorus. Gas in stomach small bowel and colon. Gas extends to the rectum with mild colonic distention. No evidence of air-fluid levels. Surgical changes of the lumbar spine. IMPRESSION: Interval placement of weighted tip enteric feeding tube which terminates at the pylorus. Interval removal of gastric tube Electronically Signed   By: Corrie Mckusick D.O.   On: 10/21/2021 10:17   DG Swallowing Func-Speech Pathology  Result Date: 10/25/2021 Table formatting from the original result was not included. Objective Swallowing Evaluation: Type of Study: MBS-Modified Barium Swallow Study  Patient Details Name: [name] MRN: [id number] Date of Birth: 26-Jul-1966 Today's Date: 10/25/2021 Time: SLP Start Time (ACUTE ONLY): 69 -SLP Stop Time (ACUTE ONLY): 1320 SLP Time Calculation (min) (ACUTE ONLY): 20 min Past Medical History: Past Medical History: Diagnosis Date  Asthma   Diabetes mellitus without complication (HCC)   GERD (gastroesophageal reflux disease)    Heart murmur   from childhood rheumatic fever, per patient  Hyperlipidemia   Pneumonia 12/14  Wears partial dentures   top and bottom partials Past Surgical History: Past Surgical History: Procedure Laterality Date  ANTERIOR LAT LUMBAR FUSION Left 07/31/2013  Procedure: ANTERIOR LATERAL LUMBAR FUSION 1 LEVEL/Left sided lumbar 3-4 lateral interbody fusion with instrumentation and allograft.;  Surgeon: Lawrence Santiago  Dumonski, MD;  Location: [place];  Service: Orthopedics;  Laterality: Left;  Left sided lumbar 3-4 lateral interbody fusion with instrumentation and allograft.  APPENDECTOMY  30- 40 years  carpal tunnel    right arm  CARPAL TUNNEL RELEASE Right 12/15/2013  Procedure: RIGHT CARPAL TUNNEL RELEASE ENDOSCOPIC;  Surgeon: Jolyn Nap, MD;  Location: [place];  Service: Orthopedics;  Laterality: Right;  LATERAL EPICONDYLE RELEASE Right 12/15/2013  Procedure: RIGHT ULNAR NEUROPLASTY AT ELBOW ;  Surgeon: Jolyn Nap, MD;  Location: [place];  Service: Orthopedics;  Laterality: Right;  LUMBAR FUSION   HPI: Pt is a 56 y.o. female admitted 09/30/21 after becoming minimally responsive after doing cocaine with husband, required emergent intubation 12/23. ETT 12/23-12/31, reintubated 12/31-1/6, failed again; s/p trach 1/7. PMH includes asthma, DM2, HLD, PTSD, anxiety, chronic pancreatitis.  Subjective: alert  Recommendations for follow up therapy are one component of a multi-disciplinary discharge planning process, led by the attending physician.  Recommendations may be updated based on patient status, additional functional criteria and insurance authorization. Assessment / Plan / Recommendation Clinical Impressions 10/25/2021 Clinical Impression Pt presents with a very mild pharyngeal dysphagia marked by high/transient penetration of thin liquids (Penetration/Aspiration scale of 2 and 3) during the swallow.  There was no aspiration, despite large, successive boluses and mixed solid  and liquid consistencies.  Oral phase was WNL. There was excellent pharyngeal transfer with no residue post swallow.  Pt's trach was capped during study; her voice was stronger and of clearer quality.  She was very excited about results and motivated to eat.  Recommend starting a regular diet with thin liquids. Meds whole in water.  Cortrak can be discontinued. SLP Visit Diagnosis Dysphagia, pharyngeal phase (R13.13) Attention and concentration deficit following -- Frontal lobe and executive function deficit following -- Impact on safety and function Mild aspiration risk   Treatment Recommendations 10/25/2021 Treatment Recommendations Therapy as outlined in treatment plan below   No flowsheet data found. Diet Recommendations 10/25/2021 SLP Diet Recommendations Regular solids;Thin liquid Liquid Administration via Straw Medication Administration Whole meds with liquid Compensations -- Postural Changes --   Other Recommendations 10/25/2021 Recommended Consults -- Oral Care Recommendations -- Other Recommendations -- Follow Up Recommendations Acute inpatient rehab (3hours/day) Assistance recommended at discharge -- Functional Status Assessment Patient has had a recent decline in their functional status and demonstrates the ability to make significant improvements in function in a reasonable and predictable amount of time. Frequency and Duration  10/25/2021 Speech Therapy Frequency (ACUTE ONLY) min 1 x/week Treatment Duration 1 week   Oral Phase 10/25/2021 Oral Phase WFL Oral - Pudding Teaspoon -- Oral - Pudding Cup -- Oral - Honey Teaspoon -- Oral - Honey Cup -- Oral - Nectar Teaspoon -- Oral - Nectar Cup -- Oral - Nectar Straw -- Oral - Thin Teaspoon -- Oral - Thin Cup -- Oral - Thin Straw -- Oral - Puree -- Oral - Mech Soft -- Oral - Regular -- Oral - Multi-Consistency -- Oral - Pill -- Oral Phase - Comment --  Pharyngeal Phase 10/25/2021 Pharyngeal Phase Impaired Pharyngeal- Pudding Teaspoon -- Pharyngeal -- Pharyngeal-  Pudding Cup -- Pharyngeal -- Pharyngeal- Honey Teaspoon -- Pharyngeal -- Pharyngeal- Honey Cup -- Pharyngeal -- Pharyngeal- Nectar Teaspoon -- Pharyngeal -- Pharyngeal- Nectar Cup -- Pharyngeal -- Pharyngeal- Nectar Straw -- Pharyngeal -- Pharyngeal- Thin Teaspoon -- Pharyngeal -- Pharyngeal- Thin Cup -- Pharyngeal -- Pharyngeal- Thin Straw Delayed swallow initiation-pyriform sinuses;Penetration/Aspiration before swallow Pharyngeal Material enters airway,  remains ABOVE vocal cords then ejected out;Material enters airway, remains ABOVE vocal cords and not ejected out Pharyngeal- Puree -- Pharyngeal -- Pharyngeal- Mechanical Soft -- Pharyngeal -- Pharyngeal- Regular -- Pharyngeal -- Pharyngeal- Multi-consistency -- Pharyngeal -- Pharyngeal- Pill -- Pharyngeal -- Pharyngeal Comment --  Cervical Esophageal Phase  10/25/2021 Cervical Esophageal Phase WFL Pudding Teaspoon -- Pudding Cup -- Honey Teaspoon -- Honey Cup -- Nectar Teaspoon -- Nectar Cup -- Nectar Straw -- Thin Teaspoon -- Thin Cup -- Thin Straw -- Puree -- Mechanical Soft -- Regular -- Multi-consistency -- Pill -- Cervical Esophageal Comment -- [name] 10/25/2021, 1:38 PM                     ECHOCARDIOGRAM COMPLETE  Result Date: 10/01/2021    ECHOCARDIOGRAM REPORT   Patient Name:   ANNISON BIRCHARD Date of Exam: 10/01/2021 Medical Rec #:  [id number]     Height:       66.0 in Accession #:    [id number]    Weight:       164.0 lb Date of Birth:  26-Sep-1966      BSA:          1.838 m Patient Age:    77 years      BP:           87/54 mmHg Patient Gender: F             HR:           76 bpm. Exam Location:  Inpatient Procedure: 2D Echo, Cardiac Doppler and Color Doppler Indications:    CHF  History:        Patient has no prior history of Echocardiogram examinations.  Sonographer:    Glo Herring Referring Phys: [id number] [place] MARSHALL  Sonographer Comments: Echo performed with patient supine and on artificial respirator. Image acquisition  challenging due to respiratory motion. IMPRESSIONS  1. Left ventricular ejection fraction, by estimation, is 65 to 70%. The left ventricle has normal function. The left ventricle has no regional wall motion abnormalities. There is mild left ventricular hypertrophy. Left ventricular diastolic parameters are indeterminate.  2. Right ventricular systolic function is normal. The right ventricular size is normal. Tricuspid regurgitation signal is inadequate for assessing PA pressure.  3. The mitral valve is normal in structure. No evidence of mitral valve regurgitation. No evidence of mitral stenosis.  4. The aortic valve was not well visualized. Aortic valve regurgitation is not visualized. Mild aortic valve stenosis. Vmax 2.2 m/s, MG 44mmHg, DI 0.56, AVA 1.8cm^2 FINDINGS  Left Ventricle: Left ventricular ejection fraction, by estimation, is 65 to 70%. The left ventricle has normal function. The left ventricle has no regional wall motion abnormalities. The left ventricular internal cavity size was normal in size. There is  mild left ventricular hypertrophy. Left ventricular diastolic parameters are indeterminate. Right Ventricle: The right ventricular size is normal. No increase in right ventricular wall thickness. Right ventricular systolic function is normal. Tricuspid regurgitation signal is inadequate for assessing PA pressure. Left Atrium: Left atrial size was normal in size. Right Atrium: Right atrial size was normal in size. Pericardium: Trivial pericardial effusion is present. Mitral Valve: The mitral valve is normal in structure. No evidence of mitral valve regurgitation. No evidence of mitral valve stenosis. Tricuspid Valve: The tricuspid valve is normal in structure. Tricuspid valve regurgitation is trivial. Aortic Valve: The aortic valve was not well visualized. Aortic valve regurgitation is not visualized. Mild aortic stenosis is present. Aortic  valve mean gradient measures 9.0 mmHg. Aortic valve peak  gradient measures 19.0 mmHg. Aortic valve area, by VTI measures 1.76 cm. Pulmonic Valve: The pulmonic valve was not well visualized. Pulmonic valve regurgitation is not visualized. Aorta: The aortic root and ascending aorta are structurally normal, with no evidence of dilitation. IAS/Shunts: The interatrial septum was not well visualized.  LEFT VENTRICLE PLAX 2D LVIDd:         3.80 cm   Diastology LVIDs:         2.50 cm   LV e' medial:    5.22 cm/s LV PW:         1.00 cm   LV E/e' medial:  17.1 LV IVS:        1.00 cm   LV e' lateral:   8.27 cm/s LVOT diam:     2.00 cm   LV E/e' lateral: 10.8 LV SV:         72 LV SV Index:   39 LVOT Area:     3.14 cm  RIGHT VENTRICLE            IVC RV S prime:     9.68 cm/s  IVC diam: 1.90 cm LEFT ATRIUM             Index LA diam:        2.90 cm 1.58 cm/m LA Vol (A2C):   35.7 ml 19.42 ml/m LA Vol (A4C):   28.1 ml 15.29 ml/m LA Biplane Vol: 32.6 ml 17.74 ml/m  AORTIC VALVE                     PULMONIC VALVE AV Area (Vmax):    1.69 cm      PV Vmax:       0.82 m/s AV Area (Vmean):   1.93 cm      PV Peak grad:  2.7 mmHg AV Area (VTI):     1.76 cm AV Vmax:           218.00 cm/s AV Vmean:          142.000 cm/s AV VTI:            0.408 m AV Peak Grad:      19.0 mmHg AV Mean Grad:      9.0 mmHg LVOT Vmax:         117.00 cm/s LVOT Vmean:        87.300 cm/s LVOT VTI:          0.229 m LVOT/AV VTI ratio: 0.56  AORTA Ao Root diam: 2.50 cm Ao Asc diam:  2.50 cm MITRAL VALVE MV Area (PHT): 2.93 cm    SHUNTS MV Decel Time: 259 msec    Systemic VTI:  0.23 m MV E velocity: 89.50 cm/s  Systemic Diam: 2.00 cm MV A velocity: 85.30 cm/s MV E/A ratio:  1.05 Oswaldo Milian MD Electronically signed by Oswaldo Milian MD Signature Date/Time: 10/01/2021/2:14:20 PM    Final    VAS [place] LOWER EXTREMITY VENOUS (DVT)  Result Date: 10/14/2021  Lower Venous DVT Study Patient Name:  [name]  Date of Exam:   10/14/2021 Medical Rec #: [id number]      Accession #:    [id number] Date of Birth:  [birth year]       Patient Gender: F Patient Age:   33 years Exam Location[facility]  [facility] Procedure:      VAS [place] LOWER EXTREMITY VENOUS (DVT) Referring Phys: Ina Homes --------------------------------------------------------------------------------  Indications: Edema.  Comparison Study: no prior Performing Technologist: Archie Patten RVS  Examination Guidelines: A complete evaluation includes B-mode imaging, spectral Doppler, color Doppler, and power Doppler as needed of all accessible portions of each vessel. Bilateral testing is considered an integral part of a complete examination. Limited examinations for reoccurring indications may be performed as noted. The reflux portion of the exam is performed with the patient in reverse Trendelenburg.  +---------+---------------+---------+-----------+----------+--------------+  RIGHT     Compressibility Phasicity Spontaneity Properties Thrombus Aging  +---------+---------------+---------+-----------+----------+--------------+  CFV       Full            Yes       Yes                                    +---------+---------------+---------+-----------+----------+--------------+  SFJ       Full                                                             +---------+---------------+---------+-----------+----------+--------------+  FV Prox   Full                                                             +---------+---------------+---------+-----------+----------+--------------+  FV Mid    Full                                                             +---------+---------------+---------+-----------+----------+--------------+  FV Distal Full                                                             +---------+---------------+---------+-----------+----------+--------------+  PFV       Full                                                             +---------+---------------+---------+-----------+----------+--------------+  POP       Full            Yes       Yes                                     +---------+---------------+---------+-----------+----------+--------------+  PTV       Full                                                             +---------+---------------+---------+-----------+----------+--------------+  PERO      Full                                                             +---------+---------------+---------+-----------+----------+--------------+   +---------+---------------+---------+-----------+----------+--------------+  LEFT      Compressibility Phasicity Spontaneity Properties Thrombus Aging  +---------+---------------+---------+-----------+----------+--------------+  CFV       Full            Yes       Yes                                    +---------+---------------+---------+-----------+----------+--------------+  SFJ       Full                                                             +---------+---------------+---------+-----------+----------+--------------+  FV Prox   Full                                                             +---------+---------------+---------+-----------+----------+--------------+  FV Mid    Full                                                             +---------+---------------+---------+-----------+----------+--------------+  FV Distal Full                                                             +---------+---------------+---------+-----------+----------+--------------+  PFV       Full                                                             +---------+---------------+---------+-----------+----------+--------------+  POP       Full            Yes       Yes                                    +---------+---------------+---------+-----------+----------+--------------+  PTV       Full                                                             +---------+---------------+---------+-----------+----------+--------------+  PERO      Full                                                              +---------+---------------+---------+-----------+----------+--------------+     Summary: BILATERAL: - No evidence of deep vein thrombosis seen in the lower extremities, bilaterally. -No evidence of popliteal cyst, bilaterally.   *See table(s) above for measurements and observations. Electronically signed by Orlie Pollen on 10/14/2021 at 6:57:06 PM.    Final    [place] EKG SITE RITE  Result Date: 10/04/2021 If Site Rite image not attached, placement could not be confirmed due to current cardiac rhythm.      All questions were answered. The patient knows to call the clinic with any problems, questions or concerns. I spent 20 minutes in the care of this patient including H and P, review of records, counseling and coordination of care.     Benay Pike, MD 10/27/2021 6:02 AM   This encounter was created in error - please disregard. This encounter was created in error - please disregard.

## 2021-11-07 DIAGNOSIS — I5032 Chronic diastolic (congestive) heart failure: Secondary | ICD-10-CM | POA: Insufficient documentation

## 2021-11-07 DIAGNOSIS — E119 Type 2 diabetes mellitus without complications: Secondary | ICD-10-CM | POA: Insufficient documentation

## 2021-12-16 ENCOUNTER — Encounter: Payer: Self-pay | Admitting: Emergency Medicine

## 2021-12-16 ENCOUNTER — Other Ambulatory Visit: Payer: Self-pay

## 2021-12-16 ENCOUNTER — Telehealth: Payer: Self-pay

## 2021-12-16 ENCOUNTER — Other Ambulatory Visit (HOSPITAL_COMMUNITY): Payer: Self-pay

## 2021-12-16 ENCOUNTER — Ambulatory Visit (INDEPENDENT_AMBULATORY_CARE_PROVIDER_SITE_OTHER): Payer: Medicare HMO | Admitting: Emergency Medicine

## 2021-12-16 DIAGNOSIS — J4489 Other specified chronic obstructive pulmonary disease: Secondary | ICD-10-CM

## 2021-12-16 DIAGNOSIS — J449 Chronic obstructive pulmonary disease, unspecified: Secondary | ICD-10-CM

## 2021-12-16 DIAGNOSIS — Z720 Tobacco use: Secondary | ICD-10-CM

## 2021-12-16 DIAGNOSIS — J9601 Acute respiratory failure with hypoxia: Secondary | ICD-10-CM

## 2021-12-16 MED ORDER — NICOTINE 7 MG/24HR TD PT24: 7.0000 mg | MEDICATED_PATCH | Freq: Every day | TRANSDERMAL | 0 refills | Status: DC

## 2021-12-16 MED ORDER — ALBUTEROL SULFATE HFA 108 (90 BASE) MCG/ACT IN AERS: 1.0000 | INHALATION_SPRAY | Freq: Four times a day (QID) | RESPIRATORY_TRACT | 0 refills | Status: DC | PRN

## 2021-12-16 MED ORDER — STIOLTO RESPIMAT 2.5-2.5 MCG/ACT IN AERS: 2.0000 | INHALATION_SPRAY | Freq: Every day | RESPIRATORY_TRACT | 0 refills | Status: DC

## 2021-12-16 NOTE — Addendum Note (Signed)
Addended by: Gavin Potters R on: 12/16/2021 12:23 PM ? ? Modules accepted: Orders ? ?

## 2021-12-16 NOTE — Assessment & Plan Note (Signed)
Severe exacerbation in the setting of pneumonia that required mechanical ventilation, ultimately tracheostomy.  Now decannulated.  She does have daily symptoms.  Need to perform pulmonary function testing to quantify her degree of obstruction.  I like to do a trial of Stiolto to see if she gets benefit.  Continue albuterol as needed.  Discussed smoking cessation and its importance with her today.  She is going to work on this, wants to try nicotine patches. ?

## 2021-12-16 NOTE — Patient Instructions (Signed)
Please work hard to decrease her cigarettes.  Ultimate goal will be to stop altogether. ?You can use nicotine patches to help with cigarette cravings.  We will send a prescription for this ?Try using Stiolto 2 puffs once daily.  Keep track of whether this helps your breathing.  If so we may decide to continue it going forward. ?Keep albuterol available to use 2 puffs when you needed for shortness of breath, chest tightness, wheezing.  We will send a refill for this today. ?We will perform pulmonary function testing in next office visit. ?We will repeat your walking oximetry testing at your next office visit to see if you still require home oxygen ?Follow with Dr. Lamonte Sakai next available with full pulmonary function testing on the same day.  ? ?

## 2021-12-16 NOTE — Telephone Encounter (Signed)
Notified pt of this, pt stated understanding. Nothing further needed at this time.  ?

## 2021-12-16 NOTE — Progress Notes (Signed)
? ?Subjective:  ? ? Patient ID: [name], female    DOB: 16-Mar-1966, 56 y.o.   MRN: [id number] ? ?HPI ?56 year old woman with a history of tobacco use (30 pack years, currently 0.5 pk/day), carries a diagnosis of asthma/COPD.  Past medical history also significant for diabetes, GERD, hyperlipidemia, leukocytosis of unclear etiology.  She is referred for evaluation of COPD/Asthma. She had a hospitalization thru 10/26/21 due to CAP + AE-COPD, chronic resp failure requiring trach. She was decannulation before d/c to home. Has been prescribed O2 at 2L/min.  ?She has albuterol available to use.  Was formally on Singulair, not currently.  She is on lisinopril/HCTZ. ?Today she reports that she is recovering. She does have exertional SOB, walking through large store. Has to pace herself. Occasional cough, some sputum every day. Yellow mucous.  ? ? ?Review of Systems ?As per HPI ? ?Past Medical History:  ?Diagnosis Date  ? Asthma   ? Diabetes mellitus without complication ([place])   ? GERD (gastroesophageal reflux disease)   ? Heart murmur   ? from childhood rheumatic fever, per patient  ? Hyperlipidemia   ? Pneumonia 12/14  ? Wears partial dentures   ? top and bottom partials  ?  ? ?Family History  ?Problem Relation Age of Onset  ? Diabetes Mother   ? Heart disease Mother   ?  ? ?Social History  ? ?Socioeconomic History  ? Marital status: Married  ?  Spouse name: Not on file  ? Number of children: Not on file  ? Years of education: Not on file  ? Highest education level: Not on file  ?Occupational History  ? Not on file  ?Tobacco Use  ? Smoking status: Every Day  ?  Types: Cigarettes  ?  Last attempt to quit: 08/30/2016  ?  Years since quitting: 5.2  ? Smokeless tobacco: Never  ? Tobacco comments:  ?  1/2 pack smoked daily [facility] 12/16/21  ?Substance and Sexual Activity  ? Alcohol use: No  ?  Comment: beer everynow and then per patient  ? Drug use: No  ? Sexual activity: Not Currently  ?  Comment: says she quit 12/14  ?Other  Topics Concern  ? Not on file  ?Social History Narrative  ? ** Merged History Encounter **  ?    ? ?Social Determinants of Health  ? ?Financial Resource Strain: Not on file  ?Food Insecurity: Not on file  ?Transportation Needs: Not on file  ?Physical Activity: Not on file  ?Stress: Not on file  ?Social Connections: Not on file  ?Intimate Partner Violence: Not on file  ?  ? ?No Known Allergies  ? ?Outpatient Medications Prior to Visit  ?Medication Sig Dispense Refill  ? ACCU-CHEK AVIVA PLUS test strip USE AS DIRECTED FOR 30 DAYS  6  ? albuterol (PROVENTIL HFA;VENTOLIN HFA) 108 (90 Base) MCG/ACT inhaler Inhale 1-2 puffs into the lungs every 6 (six) hours as needed for wheezing or shortness of breath. 1 Inhaler 0  ? Cholecalciferol (VITAMIN D) 50 MCG (2000 UT) tablet Take 2,000 Units by mouth daily.    ? JANUMET 50-1000 MG tablet Take 1 tablet by mouth 2 (two) times daily.    ? LANTUS SOLOSTAR 100 UNIT/ML Solostar Pen Inject 20 Units into the skin at bedtime.    ? lisinopril-hydrochlorothiazide (ZESTORETIC) 20-25 MG tablet Take 1 tablet by mouth daily.    ? methocarbamol (ROBAXIN) 500 MG tablet Take 1 tablet (500 mg total) by mouth every 8 (eight) hours  as needed for muscle spasms.  3  ? oxyCODONE-acetaminophen (PERCOCET) 10-325 MG tablet Take 1 tablet by mouth 5 (five) times daily.    ? pantoprazole (PROTONIX) 40 MG tablet Take 40 mg by mouth daily.    ? Phenyleph-Doxylamine-DM-APAP (NYQUIL SEVERE COLD/FLU PO) Take 1 Dose by mouth See admin instructions. 2-3 times per day    ? RELION PEN NEEDLE 31G/8MM 31G X 8 MM MISC Inject 1 each into the skin daily. use as directed 100 each 2  ? rosuvastatin (CRESTOR) 40 MG tablet Take 40 mg by mouth every evening.    ? sertraline (ZOLOFT) 50 MG tablet Take 50 mg by mouth daily.    ? montelukast (SINGULAIR) 10 MG tablet Take 1 tablet (10 mg total) by mouth every evening. (Patient not taking: Reported on 12/16/2021) 90 tablet 3  ? ?No facility-administered medications prior to visit.   ? ? ? ? ?   ?Objective:  ? Physical Exam ?Vitals:  ? 12/16/21 1126  ?BP: 136/84  ?Pulse: 84  ?Temp: 98 ?F (36.7 ?C)  ?TempSrc: Oral  ?SpO2: 95%  ?Weight: 168 lb 3.2 oz (76.3 kg)  ?Height: '5\' 3"'$  (1.6 m)  ? ?Gen: Pleasant, well-nourished, in no distress,  normal affect ? ?ENT: No lesions,  mouth clear,  oropharynx clear, no postnasal drip, strong voice ? ?Neck: No JVD, no stridor, stoma looks good, well-healed ? ?Lungs: No use of accessory muscles, distant, no crackles or wheezing on normal respiration, no wheeze on forced expiration ? ?Cardiovascular: RRR, heart sounds normal, no murmur or gallops, no peripheral edema ? ?Musculoskeletal: No deformities, no cyanosis or clubbing ? ?Neuro: alert, awake, non focal ? ?Skin: Warm, no lesions or rash ? ? ?   ?Assessment & Plan:  ?COPD with asthma ([place]) ?Severe exacerbation in the setting of pneumonia that required mechanical ventilation, ultimately tracheostomy.  Now decannulated.  She does have daily symptoms.  Need to perform pulmonary function testing to quantify her degree of obstruction.  I like to do a trial of Stiolto to see if she gets benefit.  Continue albuterol as needed.  Discussed smoking cessation and its importance with her today.  She is going to work on this, wants to try nicotine patches. ? ?Acute respiratory failure with hypoxia ([place]) ?Hypoxemia identified on follow-up visit after her hospitalization.  She is using 2 L/min with some exertion, not all.  I will repeat her walking oximetry next visit.  May be able to DC the oxygen if she has improved. ? ?Tobacco use ?Discussed cessation and its importance with her today.  She is interested in cutting down, ultimately stopping.  Wants to try using nicotine patches and we will send a prescription for this today. ? ?Baltazar Apo, MD, PhD ?12/16/2021, 12:18 PM ?[facility] ?[phone] or if no answer before 7:00PM call [phone] ?For any issues after 7:00PM please call eLink  [phone] ? ? ?

## 2021-12-16 NOTE — Assessment & Plan Note (Signed)
Discussed cessation and its importance with her today.  She is interested in cutting down, ultimately stopping.  Wants to try using nicotine patches and we will send a prescription for this today. ?

## 2021-12-16 NOTE — Assessment & Plan Note (Signed)
Hypoxemia identified on follow-up visit after her hospitalization.  She is using 2 L/min with some exertion, not all.  I will repeat her walking oximetry next visit.  May be able to DC the oxygen if she has improved. ?

## 2021-12-16 NOTE — Telephone Encounter (Signed)
No PA needed for Nicotine Patches ?

## 2022-01-18 ENCOUNTER — Other Ambulatory Visit: Payer: Self-pay | Admitting: Emergency Medicine

## 2022-02-07 ENCOUNTER — Ambulatory Visit (INDEPENDENT_AMBULATORY_CARE_PROVIDER_SITE_OTHER): Payer: Medicare HMO | Admitting: Emergency Medicine

## 2022-02-07 ENCOUNTER — Encounter: Payer: Self-pay | Admitting: Emergency Medicine

## 2022-02-07 DIAGNOSIS — J9601 Acute respiratory failure with hypoxia: Secondary | ICD-10-CM

## 2022-02-07 DIAGNOSIS — Z720 Tobacco use: Secondary | ICD-10-CM | POA: Diagnosis not present

## 2022-02-07 DIAGNOSIS — J449 Chronic obstructive pulmonary disease, unspecified: Secondary | ICD-10-CM | POA: Diagnosis not present

## 2022-02-07 LAB — PULMONARY FUNCTION TEST
DL/VA % pred: 82 %
DL/VA: 3.55 ml/min/mmHg/L
DLCO cor % pred: 60 %
DLCO cor: 12 ml/min/mmHg
DLCO unc % pred: 60 %
DLCO unc: 12 ml/min/mmHg
FEF 25-75 Post: 0.74 L/sec
FEF 25-75 Pre: 1.21 L/sec
FEF2575-%Change-Post: -38 %
FEF2575-%Pred-Post: 35 %
FEF2575-%Pred-Pre: 57 %
FEV1-%Change-Post: -13 %
FEV1-%Pred-Post: 55 %
FEV1-%Pred-Pre: 64 %
FEV1-Post: 1.16 L
FEV1-Pre: 1.34 L
FEV1FVC-%Change-Post: -7 %
FEV1FVC-%Pred-Pre: 98 %
FEV6-%Change-Post: -6 %
FEV6-%Pred-Post: 62 %
FEV6-%Pred-Pre: 67 %
FEV6-Post: 1.59 L
FEV6-Pre: 1.7 L
FEV6FVC-%Pred-Post: 103 %
FEV6FVC-%Pred-Pre: 103 %
FVC-%Change-Post: -6 %
FVC-%Pred-Post: 60 %
FVC-%Pred-Pre: 64 %
FVC-Post: 1.59 L
FVC-Pre: 1.7 L
Post FEV1/FVC ratio: 73 %
Post FEV6/FVC ratio: 100 %
Pre FEV1/FVC ratio: 79 %
Pre FEV6/FVC Ratio: 100 %
RV % pred: 74 %
RV: 1.4 L
TLC % pred: 68 %
TLC: 3.38 L

## 2022-02-07 MED ORDER — STIOLTO RESPIMAT 2.5-2.5 MCG/ACT IN AERS: 2.0000 | INHALATION_SPRAY | Freq: Every day | RESPIRATORY_TRACT | 5 refills | Status: DC

## 2022-02-07 NOTE — Patient Instructions (Signed)
Full PFT performed today. °

## 2022-02-07 NOTE — Assessment & Plan Note (Signed)
She still sees some exertional hypoxemia with heavier exertion.  We will keep the oxygen available to use at these times, 2 L/min. ?

## 2022-02-07 NOTE — Progress Notes (Signed)
Full PFT performed today. °

## 2022-02-07 NOTE — Patient Instructions (Signed)
We will plan to continue Stiolto 2 puffs once daily. ?Keep your albuterol available to use 2 puffs to be needed for shortness of breath, chest tightness, wheezing. ?Continue your oxygen at 2 L/min with heavy exertion.  Our goal is to keep your saturations > 90% ?Congratulations on decreasing your smoking.  Continue to work on decreasing.  Ultimate goal is to stop altogether. ?Follow with Dr Lamonte Sakai in 6 months or sooner if you have any problems ? ?

## 2022-02-07 NOTE — Assessment & Plan Note (Signed)
She has cut down to 6 cigarettes daily, congratulated her on this.  She is motivated to stop.  Did discuss strategies for cessation with her today. ?

## 2022-02-07 NOTE — Progress Notes (Signed)
? ?Subjective:  ? ? Patient ID: [name], female    DOB: 20-Dec-1965, 56 y.o.   MRN: [id number] ? ?HPI ?56 year old woman with a history of tobacco use (30 pack years, currently 0.5 pk/day), carries a diagnosis of asthma/COPD.  Past medical history also significant for diabetes, GERD, hyperlipidemia, leukocytosis of unclear etiology.  She is referred for evaluation of COPD/Asthma. She had a hospitalization thru 10/26/21 due to CAP + AE-COPD, chronic resp failure requiring trach. She was decannulation before d/c to home. Has been prescribed O2 at 2L/min.  ?She has albuterol available to use.  Was formally on Singulair, not currently.  She is on lisinopril/HCTZ. ?Today she reports that she is recovering. She does have exertional SOB, walking through large store. Has to pace herself. Occasional cough, some sputum every day. Yellow mucous.  ? ? ?ROV 02/07/22 --follow-up visit 56 year old woman, active smoker (30 pack years), COPD/asthma.  She was hospitalized January 2023 for CAP and obstructive lung disease flare that required tracheostomy, now decannulated and on oxygen 2 L/min.  At our initial visit I started her on Stiolto to see if she would get benefit.  We discussed smoking cessation.  She underwent pulmonary function testing today as below.  Today she reports that she felt that she benefited from the [facility], has helped her functional capacity. She is still smoking about 6 cig a day. Has some intermittent cough. She has not been using O2 every day, sometimes when she exerts. She has continued to sometimes seen some desats below 90% with heavier activity.  ? ?Pulmonary function testing performed today and reviewed by me show mixed obstruction and restriction.  FEV1 is 1.34 L (64% predicted).  No bronchodilator response.  Lung volumes confirm restriction.  Decreased diffusion capacity that corrects to the normal range when adjusted for alveolar volume. ? ? ?Review of Systems ?As per HPI ? ?Past Medical History:   ?Diagnosis Date  ? Asthma   ? Diabetes mellitus without complication ([place])   ? GERD (gastroesophageal reflux disease)   ? Heart murmur   ? from childhood rheumatic fever, per patient  ? Hyperlipidemia   ? Pneumonia 12/14  ? Wears partial dentures   ? top and bottom partials  ?  ? ?Family History  ?Problem Relation Age of Onset  ? Diabetes Mother   ? Heart disease Mother   ?  ? ?Social History  ? ?Socioeconomic History  ? Marital status: Married  ?  Spouse name: Not on file  ? Number of children: Not on file  ? Years of education: Not on file  ? Highest education level: Not on file  ?Occupational History  ? Not on file  ?Tobacco Use  ? Smoking status: Every Day  ?  Types: Cigarettes  ?  Last attempt to quit: 08/30/2016  ?  Years since quitting: 5.4  ? Smokeless tobacco: Never  ? Tobacco comments:  ?  5 -6 cigarettes  smoked daily [facility] 02/07/22  ?Substance and Sexual Activity  ? Alcohol use: No  ?  Comment: beer everynow and then per patient  ? Drug use: No  ? Sexual activity: Not Currently  ?  Comment: says she quit 12/14  ?Other Topics Concern  ? Not on file  ?Social History Narrative  ? ** Merged History Encounter **  ?    ? ?Social Determinants of Health  ? ?Financial Resource Strain: Not on file  ?Food Insecurity: Not on file  ?Transportation Needs: Not on file  ?Physical Activity: Not  on file  ?Stress: Not on file  ?Social Connections: Not on file  ?Intimate Partner Violence: Not on file  ?  ? ?No Known Allergies  ? ?Outpatient Medications Prior to Visit  ?Medication Sig Dispense Refill  ? ACCU-CHEK AVIVA PLUS test strip USE AS DIRECTED FOR 30 DAYS  6  ? albuterol (VENTOLIN HFA) 108 (90 Base) MCG/ACT inhaler INHALE 1 TO 2 PUFFS INTO THE LUNGS EVERY 6 HOURS AS NEEDED FOR WHEEZING OR SHORTNESS OF BREATH 6.7 g 5  ? Cholecalciferol (VITAMIN D) 50 MCG (2000 UT) tablet Take 2,000 Units by mouth daily.    ? JANUMET 50-1000 MG tablet Take 1 tablet by mouth 2 (two) times daily.    ? LANTUS SOLOSTAR 100 UNIT/ML Solostar Pen  Inject 20 Units into the skin at bedtime.    ? lisinopril-hydrochlorothiazide (ZESTORETIC) 20-25 MG tablet Take 1 tablet by mouth daily.    ? methocarbamol (ROBAXIN) 500 MG tablet Take 1 tablet (500 mg total) by mouth every 8 (eight) hours as needed for muscle spasms.  3  ? montelukast (SINGULAIR) 10 MG tablet Take 1 tablet (10 mg total) by mouth every evening. 90 tablet 3  ? nicotine (NICODERM CQ) 7 mg/24hr patch Place 1 patch (7 mg total) onto the skin daily. 28 patch 0  ? oxyCODONE-acetaminophen (PERCOCET) 10-325 MG tablet Take 1 tablet by mouth 5 (five) times daily.    ? pantoprazole (PROTONIX) 40 MG tablet Take 40 mg by mouth daily.    ? Phenyleph-Doxylamine-DM-APAP (NYQUIL SEVERE COLD/FLU PO) Take 1 Dose by mouth See admin instructions. 2-3 times per day    ? RELION PEN NEEDLE 31G/8MM 31G X 8 MM MISC Inject 1 each into the skin daily. use as directed 100 each 2  ? rosuvastatin (CRESTOR) 40 MG tablet Take 40 mg by mouth every evening.    ? sertraline (ZOLOFT) 50 MG tablet Take 50 mg by mouth daily.    ? Tiotropium Bromide-Olodaterol (STIOLTO RESPIMAT) 2.5-2.5 MCG/ACT AERS Inhale 2 puffs into the lungs daily. 4 g 0  ? ?No facility-administered medications prior to visit.  ? ? ? ? ?   ?Objective:  ? Physical Exam ?Vitals:  ? 02/07/22 1208  ?BP: 132/74  ?Pulse: 85  ?Temp: 97.8 ?F (36.6 ?C)  ?TempSrc: Oral  ?SpO2: 98%  ?Weight: 184 lb 12.8 oz (83.8 kg)  ?Height: '5\' 4"'$  (1.626 m)  ? ?Gen: Pleasant, well-nourished, in no distress,  normal affect ? ?ENT: No lesions,  mouth clear,  oropharynx clear, no postnasal drip, strong voice ? ?Neck: No JVD, no stridor, stoma looks good, well-healed ? ?Lungs: No use of accessory muscles, distant, no crackles or wheezing on normal respiration, no wheeze on forced expiration ? ?Cardiovascular: RRR, heart sounds normal, no murmur or gallops, no peripheral edema ? ?Musculoskeletal: No deformities, no cyanosis or clubbing ? ?Neuro: alert, awake, non focal ? ?Skin: Warm, no lesions or  rash ? ? ?   ?Assessment & Plan:  ?COPD with asthma ([place]) ?Overall doing well on Stiolto with improvement in dyspnea.  Still with some dry cough.  Discussed with her today we may need to come off the lisinopril at some point in the future.  Keep albuterol available as needed.  Reviewed PFT with her today ? ?Acute respiratory failure with hypoxia ([place]) ?She still sees some exertional hypoxemia with heavier exertion.  We will keep the oxygen available to use at these times, 2 L/min. ? ?Tobacco use ?She has cut down to 6 cigarettes daily, congratulated  her on this.  She is motivated to stop.  Did discuss strategies for cessation with her today. ? ?Baltazar Apo, MD, PhD ?02/07/2022, 12:33 PM ?[facility] ?[phone] or if no answer before 7:00PM call [phone] ?For any issues after 7:00PM please call eLink [phone] ? ? ?

## 2022-02-07 NOTE — Assessment & Plan Note (Signed)
Overall doing well on Stiolto with improvement in dyspnea.  Still with some dry cough.  Discussed with her today we may need to come off the lisinopril at some point in the future.  Keep albuterol available as needed.  Reviewed PFT with her today ?

## 2022-02-07 NOTE — Addendum Note (Signed)
Addended by: Valerie Salts on: 02/07/2022 12:34 PM ? ? Modules accepted: Orders ? ?

## 2022-07-30 ENCOUNTER — Encounter (HOSPITAL_COMMUNITY): Payer: Self-pay

## 2022-07-30 ENCOUNTER — Emergency Department (HOSPITAL_COMMUNITY): Payer: Medicare HMO

## 2022-07-30 ENCOUNTER — Observation Stay (HOSPITAL_COMMUNITY)
Admission: EM | Admit: 2022-07-30 | Discharge: 2022-07-31 | Disposition: A | Discharge status: Home / Self Care | Payer: Medicare HMO | Attending: Family Medicine | Admitting: Family Medicine

## 2022-07-30 DIAGNOSIS — Z1152 Encounter for screening for COVID-19: Secondary | ICD-10-CM | POA: Insufficient documentation

## 2022-07-30 DIAGNOSIS — M5459 Other low back pain: Secondary | ICD-10-CM | POA: Diagnosis not present

## 2022-07-30 DIAGNOSIS — G8929 Other chronic pain: Secondary | ICD-10-CM | POA: Diagnosis present

## 2022-07-30 DIAGNOSIS — Z794 Long term (current) use of insulin: Secondary | ICD-10-CM | POA: Diagnosis not present

## 2022-07-30 DIAGNOSIS — J45909 Unspecified asthma, uncomplicated: Secondary | ICD-10-CM | POA: Diagnosis not present

## 2022-07-30 DIAGNOSIS — J441 Chronic obstructive pulmonary disease with (acute) exacerbation: Principal | ICD-10-CM | POA: Diagnosis present

## 2022-07-30 DIAGNOSIS — Z6831 Body mass index (BMI) 31.0-31.9, adult: Secondary | ICD-10-CM | POA: Diagnosis not present

## 2022-07-30 DIAGNOSIS — F17210 Nicotine dependence, cigarettes, uncomplicated: Secondary | ICD-10-CM | POA: Insufficient documentation

## 2022-07-30 DIAGNOSIS — D72829 Elevated white blood cell count, unspecified: Secondary | ICD-10-CM | POA: Diagnosis not present

## 2022-07-30 DIAGNOSIS — Z720 Tobacco use: Secondary | ICD-10-CM | POA: Diagnosis present

## 2022-07-30 DIAGNOSIS — R509 Fever, unspecified: Secondary | ICD-10-CM | POA: Diagnosis not present

## 2022-07-30 DIAGNOSIS — I10 Essential (primary) hypertension: Secondary | ICD-10-CM | POA: Diagnosis present

## 2022-07-30 DIAGNOSIS — M545 Low back pain, unspecified: Secondary | ICD-10-CM | POA: Diagnosis present

## 2022-07-30 DIAGNOSIS — R059 Cough, unspecified: Secondary | ICD-10-CM | POA: Insufficient documentation

## 2022-07-30 DIAGNOSIS — R0902 Hypoxemia: Secondary | ICD-10-CM

## 2022-07-30 DIAGNOSIS — E1165 Type 2 diabetes mellitus with hyperglycemia: Secondary | ICD-10-CM | POA: Diagnosis present

## 2022-07-30 DIAGNOSIS — R0602 Shortness of breath: Secondary | ICD-10-CM | POA: Insufficient documentation

## 2022-07-30 DIAGNOSIS — E669 Obesity, unspecified: Secondary | ICD-10-CM | POA: Diagnosis not present

## 2022-07-30 DIAGNOSIS — J189 Pneumonia, unspecified organism: Secondary | ICD-10-CM

## 2022-07-30 DIAGNOSIS — Z7984 Long term (current) use of oral hypoglycemic drugs: Secondary | ICD-10-CM | POA: Insufficient documentation

## 2022-07-30 DIAGNOSIS — Z79899 Other long term (current) drug therapy: Secondary | ICD-10-CM | POA: Insufficient documentation

## 2022-07-30 DIAGNOSIS — K861 Other chronic pancreatitis: Secondary | ICD-10-CM | POA: Diagnosis present

## 2022-07-30 DIAGNOSIS — J9611 Chronic respiratory failure with hypoxia: Secondary | ICD-10-CM | POA: Diagnosis present

## 2022-07-30 HISTORY — DX: Chronic obstructive pulmonary disease, unspecified: J44.9

## 2022-07-30 LAB — CBC WITH DIFFERENTIAL/PLATELET
Abs Immature Granulocytes: 0.08 10*3/uL — ABNORMAL HIGH (ref 0.00–0.07)
Basophils Absolute: 0.1 10*3/uL (ref 0.0–0.1)
Basophils Relative: 0 %
Eosinophils Absolute: 0.1 10*3/uL (ref 0.0–0.5)
Eosinophils Relative: 0 %
HCT: 44.3 % (ref 36.0–46.0)
Hemoglobin: 13.2 g/dL (ref 12.0–15.0)
Immature Granulocytes: 1 %
Lymphocytes Relative: 17 %
Lymphs Abs: 2.7 10*3/uL (ref 0.7–4.0)
MCH: 27.2 pg (ref 26.0–34.0)
MCHC: 29.8 g/dL — ABNORMAL LOW (ref 30.0–36.0)
MCV: 91.3 fL (ref 80.0–100.0)
Monocytes Absolute: 0.6 10*3/uL (ref 0.1–1.0)
Monocytes Relative: 4 %
Neutro Abs: 11.9 10*3/uL — ABNORMAL HIGH (ref 1.7–7.7)
Neutrophils Relative %: 78 %
Platelets: 283 10*3/uL (ref 150–400)
RBC: 4.85 MIL/uL (ref 3.87–5.11)
RDW: 15 % (ref 11.5–15.5)
WBC: 15.4 10*3/uL — ABNORMAL HIGH (ref 4.0–10.5)
nRBC: 0 % (ref 0.0–0.2)

## 2022-07-30 LAB — BASIC METABOLIC PANEL
Anion gap: 8 (ref 5–15)
BUN: 11 mg/dL (ref 6–20)
CO2: 25 mmol/L (ref 22–32)
Calcium: 9.2 mg/dL (ref 8.9–10.3)
Chloride: 105 mmol/L (ref 98–111)
Creatinine, Ser: 1.15 mg/dL — ABNORMAL HIGH (ref 0.44–1.00)
GFR, Estimated: 55 mL/min — ABNORMAL LOW (ref >60)
Glucose, Bld: 164 mg/dL — ABNORMAL HIGH (ref 70–99)
Potassium: 4.4 mmol/L (ref 3.5–5.1)
Sodium: 138 mmol/L (ref 135–145)

## 2022-07-30 LAB — TROPONIN I (HIGH SENSITIVITY): Troponin I (High Sensitivity): 5 ng/L (ref <18)

## 2022-07-30 LAB — RESP PANEL BY RT-PCR (FLU A&B, COVID) ARPGX2
Influenza A by PCR: NEGATIVE
Influenza B by PCR: NEGATIVE
SARS Coronavirus 2 by RT PCR: NEGATIVE

## 2022-07-30 LAB — MAGNESIUM: Magnesium: 2.1 mg/dL (ref 1.7–2.4)

## 2022-07-30 LAB — LACTIC ACID, PLASMA
Lactic Acid, Venous: 2.4 mmol/L (ref 0.5–1.9)
Lactic Acid, Venous: 4.2 mmol/L (ref 0.5–1.9)

## 2022-07-30 LAB — BRAIN NATRIURETIC PEPTIDE: B Natriuretic Peptide: 19.6 pg/mL (ref 0.0–100.0)

## 2022-07-30 LAB — CBG MONITORING, ED: Glucose-Capillary: 284 mg/dL — ABNORMAL HIGH (ref 70–99)

## 2022-07-30 MED ORDER — INSULIN ASPART 100 UNIT/ML IJ SOLN
0.0000 [IU] | Freq: Three times a day (TID) | INTRAMUSCULAR | Status: DC
  Administered 2022-07-31: 5 [IU] via SUBCUTANEOUS
  Filled 2022-07-30: qty 0.15

## 2022-07-30 MED ORDER — OXYCODONE HCL 5 MG PO TABS
5.0000 mg | ORAL_TABLET | ORAL | Status: DC | PRN
  Administered 2022-07-31: 5 mg via ORAL
  Filled 2022-07-30: qty 1

## 2022-07-30 MED ORDER — LOSARTAN POTASSIUM 25 MG PO TABS
25.0000 mg | ORAL_TABLET | Freq: Every day | ORAL | Status: DC
  Administered 2022-07-31: 25 mg via ORAL
  Filled 2022-07-30: qty 1

## 2022-07-30 MED ORDER — METHYLPREDNISOLONE SODIUM SUCC 125 MG IJ SOLR
125.0000 mg | Freq: Once | INTRAMUSCULAR | Status: AC
  Administered 2022-07-30: 125 mg via INTRAVENOUS
  Filled 2022-07-30: qty 2

## 2022-07-30 MED ORDER — METHOCARBAMOL 500 MG PO TABS: 500.0000 mg | ORAL_TABLET | Freq: Three times a day (TID) | ORAL | Status: DC | PRN

## 2022-07-30 MED ORDER — OXYCODONE HCL 5 MG PO TABS
5.0000 mg | ORAL_TABLET | Freq: Once | ORAL | Status: AC
  Administered 2022-07-30: 5 mg via ORAL
  Filled 2022-07-30: qty 1

## 2022-07-30 MED ORDER — INSULIN ASPART 100 UNIT/ML IJ SOLN
0.0000 [IU] | Freq: Every day | INTRAMUSCULAR | Status: DC
  Administered 2022-07-30: 2 [IU] via SUBCUTANEOUS
  Filled 2022-07-30: qty 0.05

## 2022-07-30 MED ORDER — METHYLPREDNISOLONE SODIUM SUCC 40 MG IJ SOLR
40.0000 mg | Freq: Two times a day (BID) | INTRAMUSCULAR | Status: DC
  Administered 2022-07-31: 40 mg via INTRAVENOUS
  Filled 2022-07-30: qty 1

## 2022-07-30 MED ORDER — NICOTINE 7 MG/24HR TD PT24
7.0000 mg | MEDICATED_PATCH | Freq: Every day | TRANSDERMAL | Status: DC
  Administered 2022-07-30: 7 mg via TRANSDERMAL
  Filled 2022-07-30: qty 1

## 2022-07-30 MED ORDER — SODIUM CHLORIDE 0.9 % IV SOLN
500.0000 mg | Freq: Once | INTRAVENOUS | Status: AC
  Administered 2022-07-30: 500 mg via INTRAVENOUS
  Filled 2022-07-30: qty 5

## 2022-07-30 MED ORDER — LACTATED RINGERS IV BOLUS
250.0000 mL | Freq: Once | INTRAVENOUS | Status: AC
  Administered 2022-07-30: 250 mL via INTRAVENOUS

## 2022-07-30 MED ORDER — ONDANSETRON HCL 4 MG/2ML IJ SOLN: 4.0000 mg | Freq: Four times a day (QID) | INTRAMUSCULAR | Status: DC | PRN

## 2022-07-30 MED ORDER — SODIUM CHLORIDE 0.9 % IV SOLN: 500.0000 mg | INTRAVENOUS | Status: DC

## 2022-07-30 MED ORDER — ARFORMOTEROL TARTRATE 15 MCG/2ML IN NEBU
15.0000 ug | INHALATION_SOLUTION | Freq: Two times a day (BID) | RESPIRATORY_TRACT | Status: DC
  Administered 2022-07-30 – 2022-07-31 (×2): 15 ug via RESPIRATORY_TRACT
  Filled 2022-07-30 (×2): qty 2

## 2022-07-30 MED ORDER — ONDANSETRON HCL 4 MG PO TABS: 4.0000 mg | ORAL_TABLET | Freq: Four times a day (QID) | ORAL | Status: DC | PRN

## 2022-07-30 MED ORDER — PANTOPRAZOLE SODIUM 40 MG PO TBEC
40.0000 mg | DELAYED_RELEASE_TABLET | Freq: Every day | ORAL | Status: DC
  Administered 2022-07-30 – 2022-07-31 (×2): 40 mg via ORAL
  Filled 2022-07-30 (×2): qty 1

## 2022-07-30 MED ORDER — HYDROCHLOROTHIAZIDE 25 MG PO TABS
25.0000 mg | ORAL_TABLET | Freq: Every day | ORAL | Status: DC
  Administered 2022-07-31: 25 mg via ORAL
  Filled 2022-07-30: qty 1

## 2022-07-30 MED ORDER — OXYCODONE-ACETAMINOPHEN 10-325 MG PO TABS: 1.0000 | ORAL_TABLET | ORAL | Status: DC | PRN

## 2022-07-30 MED ORDER — ROSUVASTATIN CALCIUM 20 MG PO TABS
40.0000 mg | ORAL_TABLET | Freq: Every evening | ORAL | Status: DC
  Administered 2022-07-30: 40 mg via ORAL
  Filled 2022-07-30: qty 2

## 2022-07-30 MED ORDER — IPRATROPIUM-ALBUTEROL 0.5-2.5 (3) MG/3ML IN SOLN
3.0000 mL | Freq: Four times a day (QID) | RESPIRATORY_TRACT | Status: DC
  Administered 2022-07-30 – 2022-07-31 (×3): 3 mL via RESPIRATORY_TRACT
  Filled 2022-07-30 (×3): qty 3

## 2022-07-30 MED ORDER — SODIUM CHLORIDE 0.9 % IV SOLN
1.0000 g | Freq: Once | INTRAVENOUS | Status: AC
  Administered 2022-07-30: 1 g via INTRAVENOUS
  Filled 2022-07-30: qty 10

## 2022-07-30 MED ORDER — IPRATROPIUM-ALBUTEROL 0.5-2.5 (3) MG/3ML IN SOLN
3.0000 mL | Freq: Once | RESPIRATORY_TRACT | Status: AC
  Administered 2022-07-30: 3 mL via RESPIRATORY_TRACT
  Filled 2022-07-30: qty 3

## 2022-07-30 MED ORDER — SODIUM CHLORIDE 0.9 % IV SOLN
2.0000 g | Freq: Every day | INTRAVENOUS | Status: DC
  Administered 2022-07-31: 2 g via INTRAVENOUS
  Filled 2022-07-30: qty 20

## 2022-07-30 MED ORDER — OXYCODONE-ACETAMINOPHEN 5-325 MG PO TABS
1.0000 | ORAL_TABLET | ORAL | Status: DC | PRN
  Administered 2022-07-31 (×2): 1 via ORAL
  Filled 2022-07-30 (×2): qty 1

## 2022-07-30 MED ORDER — UMECLIDINIUM BROMIDE 62.5 MCG/ACT IN AEPB
1.0000 | INHALATION_SPRAY | Freq: Every day | RESPIRATORY_TRACT | Status: DC
  Administered 2022-07-31: 1 via RESPIRATORY_TRACT
  Filled 2022-07-30: qty 7

## 2022-07-30 MED ORDER — MONTELUKAST SODIUM 10 MG PO TABS
10.0000 mg | ORAL_TABLET | Freq: Every evening | ORAL | Status: DC
  Administered 2022-07-30: 10 mg via ORAL
  Filled 2022-07-30: qty 1

## 2022-07-30 MED ORDER — SODIUM CHLORIDE 0.9 % IV SOLN: 2.0000 g | INTRAVENOUS | Status: DC

## 2022-07-30 MED ORDER — OXYCODONE-ACETAMINOPHEN 5-325 MG PO TABS
1.0000 | ORAL_TABLET | Freq: Once | ORAL | Status: AC
  Administered 2022-07-30: 1 via ORAL
  Filled 2022-07-30: qty 1

## 2022-07-30 MED ORDER — SERTRALINE HCL 50 MG PO TABS
50.0000 mg | ORAL_TABLET | Freq: Every day | ORAL | Status: DC
  Administered 2022-07-30 – 2022-07-31 (×2): 50 mg via ORAL
  Filled 2022-07-30 (×2): qty 1

## 2022-07-30 MED ORDER — ALBUTEROL SULFATE (2.5 MG/3ML) 0.083% IN NEBU: 2.5000 mg | INHALATION_SOLUTION | RESPIRATORY_TRACT | Status: DC | PRN

## 2022-07-30 MED ORDER — PREDNISONE 20 MG PO TABS: 40.0000 mg | ORAL_TABLET | Freq: Every day | ORAL | Status: DC

## 2022-07-30 MED ORDER — INSULIN GLARGINE-YFGN 100 UNIT/ML ~~LOC~~ SOLN
20.0000 [IU] | Freq: Every day | SUBCUTANEOUS | Status: DC
  Administered 2022-07-30: 20 [IU] via SUBCUTANEOUS
  Filled 2022-07-30 (×2): qty 0.2

## 2022-07-30 MED ORDER — ENOXAPARIN SODIUM 40 MG/0.4ML IJ SOSY
40.0000 mg | PREFILLED_SYRINGE | INTRAMUSCULAR | Status: DC
  Administered 2022-07-30: 40 mg via SUBCUTANEOUS
  Filled 2022-07-30: qty 0.4

## 2022-07-30 NOTE — Assessment & Plan Note (Signed)
-   Continue home oxycodone-acetaminophen 10 five times daily

## 2022-07-30 NOTE — Assessment & Plan Note (Signed)
Smoking cessation recommended 

## 2022-07-30 NOTE — H&P (Signed)
History and Physical    Patient: [name] HXT:[id number] DOB: January 13, 1966 DOA: 07/30/2022 DOS: the patient was seen and examined on 07/30/2022 PCP: [facility] [employer]  Patient coming from: Home  Chief Complaint:  Chief Complaint  Patient presents with   Shortness of Breath       HPI:  [name] is a 56 y.o. F with asthma/COPD overlap, chronic respiratory failure on O2 at night and prn, obesity, DM, chronic pancreatitis and chronic leukocytosis who presented with few weeks cough, then progressed to sweats, subjective fever, and shortness of breath.  In the ER, SpO2 85% on room air, CXR with pneumonia like infiltrates, COVID-.         Review of Systems  Constitutional:  Positive for chills, diaphoresis and fever.  Respiratory:  Positive for cough, sputum production, shortness of breath and wheezing.   All other systems reviewed and are negative.    Past Medical History:  Diagnosis Date   Asthma    COPD (chronic obstructive pulmonary disease) ([place])    Diabetes mellitus without complication (HCC)    GERD (gastroesophageal reflux disease)    Heart murmur    from childhood rheumatic fever, per patient   Hyperlipidemia    Pneumonia 09/2013   Wears partial dentures    top and bottom partials   Past Surgical History:  Procedure Laterality Date   ANTERIOR LAT LUMBAR FUSION Left 07/31/2013   Procedure: ANTERIOR LATERAL LUMBAR FUSION 1 LEVEL/Left sided lumbar 3-4 lateral interbody fusion with instrumentation and allograft.;  Surgeon: Sinclair Ship, MD;  Location: [facility];  Service: Orthopedics;  Laterality: Left;  Left sided lumbar 3-4 lateral interbody fusion with instrumentation and allograft.   APPENDECTOMY  30- 40 years   carpal tunnel     right arm   CARPAL TUNNEL RELEASE Right 12/15/2013   Procedure: RIGHT CARPAL TUNNEL RELEASE ENDOSCOPIC;  Surgeon: Jolyn Nap, MD;  Location: [facility];  Service: Orthopedics;  Laterality: Right;    LATERAL EPICONDYLE RELEASE Right 12/15/2013   Procedure: RIGHT ULNAR NEUROPLASTY AT ELBOW ;  Surgeon: Jolyn Nap, MD;  Location: [facility];  Service: Orthopedics;  Laterality: Right;   LUMBAR FUSION     Social History:  reports that she has been smoking cigarettes. She has never used smokeless tobacco. She reports that she does not drink alcohol and does not use drugs.  No Known Allergies  Family History  Problem Relation Age of Onset   Diabetes Mother    Heart disease Mother     Prior to Admission medications   Medication Sig Start Date End Date Taking? Authorizing Provider  ACCU-CHEK AVIVA PLUS test strip USE AS DIRECTED FOR 30 DAYS 04/07/18   [provider]  albuterol (VENTOLIN HFA) 108 (90 Base) MCG/ACT inhaler INHALE 1 TO 2 PUFFS INTO THE LUNGS EVERY 6 HOURS AS NEEDED FOR WHEEZING OR SHORTNESS OF BREATH 01/20/22   Collene Gobble, MD  Cholecalciferol (VITAMIN D) 50 MCG (2000 UT) tablet Take 2,000 Units by mouth daily.    [provider]  JANUMET 50-1000 MG tablet Take 1 tablet by mouth 2 (two) times daily. 10/03/20   [provider]  LANTUS SOLOSTAR 100 UNIT/ML Solostar Pen Inject 20 Units into the skin at bedtime. 10/26/21   Domenic Polite, MD  lisinopril-hydrochlorothiazide (ZESTORETIC) 20-25 MG tablet Take 1 tablet by mouth daily.    [provider]  methocarbamol (ROBAXIN) 500 MG tablet Take 1 tablet (500 mg total) by mouth every  8 (eight) hours as needed for muscle spasms. 10/26/21   Domenic Polite, MD  montelukast (SINGULAIR) 10 MG tablet Take 1 tablet (10 mg total) by mouth every evening. 01/02/19   Jacelyn Pi, Lilia Argue, MD  nicotine (NICODERM CQ) 7 mg/24hr patch Place 1 patch (7 mg total) onto the skin daily. 12/16/21   Collene Gobble, MD  oxyCODONE-acetaminophen (PERCOCET) 10-325 MG tablet Take 1 tablet by mouth 5 (five) times daily. 10/28/20   [provider]  pantoprazole (PROTONIX) 40 MG tablet Take 40 mg by  mouth daily.    [provider]  Phenyleph-Doxylamine-DM-APAP (NYQUIL SEVERE COLD/FLU PO) Take 1 Dose by mouth See admin instructions. 2-3 times per day    [provider]  RELION PEN NEEDLE 31G/8MM 31G X 8 MM MISC Inject 1 each into the skin daily. use as directed 10/01/18   Jacelyn Pi, Lilia Argue, MD  rosuvastatin (CRESTOR) 40 MG tablet Take 40 mg by mouth every evening. 09/28/20   [provider]  sertraline (ZOLOFT) 50 MG tablet Take 50 mg by mouth daily.    [provider]  Tiotropium Bromide-Olodaterol (STIOLTO RESPIMAT) 2.5-2.5 MCG/ACT AERS Inhale 2 puffs into the lungs daily. 02/07/22   Collene Gobble, MD    Physical Exam: Vitals:   07/30/22 1005 07/30/22 1612  BP: (!) 102/90 (!) 113/57  Pulse: 81 85  Resp: 14 18  Temp: 98.1 F (36.7 C) 98.5 F (36.9 C)  TempSrc: Oral Oral  SpO2: 92% 100%   Patient is a adult female, lying in bed, nasal cannula in place, appears weak and tired Sclera and conjunctive are normal, oropharynx moist, no oral lesions, hearing normal Trachea midline, no neck swelling RRR, no murmurs, no peripheral edema, no JVD Respiratory rate normal, wheezes bilaterally, no rales appreciated Abdomen soft without tenderness palpation or guarding, no ascites or distention Attention normal, affect appropriate, judgment insight appear normal, face symmetric, speech fluent, moves upper extremities with normal strength and coordination    Data Reviewed: Chest x-ray personally reviewed, shows bilateral interstitial opacities ECG, personally reviewed, shows normal sinus rhythm, no ST changes Basic metabolic panel unremarkable, creatinine 1.1 from baseline 0.9, glucose 164 Troponin normal Magnesium normal COVID and flu negative Lactate 2.4, due to respiratory disease Brain natruretic peptide normal CBC shows chronic leukocytosis, normal hemoglobin and platelets      Assessment and Plan: * COPD exacerbation (HCC) Sepsis ruled  out.  Likely lactic acid 2.4 is from COPD/asthma flare.  No orthopnea or swelling to suggest CHF - Start steroids - Start bronchodilators, scheduled and PRN - Continue antibiotics    Atypical pneumonia - Continue Rocephin and azithromycin    Chronic leukocytosis Has seen Dr. Chryl Heck.  Work up to date has not revealed etiology.  Obesity (BMI 30-39.9) BMI 31  Chronic respiratory failure with hypoxia (HCC) Uses O2 PRN with exertion since her severe respiratory failure (requiring tracheostomy) last year.  Chronic pancreatitis, unspecified pancreatitis type ([place]) Asymptomatic  Chronic midline low back pain without sciatica - Continue home oxycodone-acetaminophen 10 five times daily  Essential hypertension, benign BP normal - Continue HCTZ - Stop lisinopril - Start losartan  Uncontrolled type 2 diabetes mellitus with hyperglycemia (HCC) - Check A1c - Continue glargine - Hold sitagliptin-metformin - SS corrections   Tobacco use Smoking cessation recommended         Advance Care Planning: Full code  Consults: None needed  Family Communication: Husband and daughter at the bedside  Severity of Illness: The appropriate patient status  for this patient is INPATIENT. Inpatient status is judged to be reasonable and necessary in order to provide the required intensity of service to ensure the patient's safety. The patient's presenting symptoms, physical exam findings, and initial radiographic and laboratory data in the context of their chronic comorbidities is felt to place them at high risk for further clinical deterioration. Furthermore, it is not anticipated that the patient will be medically stable for discharge from the hospital within 2 midnights of admission.   * I certify that at the point of admission it is my clinical judgment that the patient will require inpatient hospital care spanning beyond 2 midnights from the point of admission due to high intensity of  service, high risk for further deterioration and high frequency of surveillance required.*  Author: Edwin Dada, MD 07/30/2022 6:57 PM  For on call review [email or web address].

## 2022-07-30 NOTE — Assessment & Plan Note (Signed)
BP normal - Continue HCTZ - Stop lisinopril - Start losartan

## 2022-07-30 NOTE — ED Provider Notes (Signed)
[name] DEPT Provider Note   CSN: [id number] Arrival date & time: 07/30/22  9833     History  Chief Complaint  Patient presents with   Shortness of Breath    [name] is a 56 y.o. female.  HPI     56 year old female with a history of COPD with asthma with exertional hypoxemia with heavier exertion and prn 2L/min O2, diabetes, hyperlipidemia history of ICU admission and intubation December to January with hypoxia secondary to pneumonia, underwent tracheostomy, history of polysubstance abuse who presents with shortness of breath.   Took out the trach before returning home, healed well, was ok, then maybe 2 weeks ago started coughing, coughing, thought it was from cold air/airconditioning/eating ice, started coughing again, went to see PCP , was told she had pneumonia and given 7 days of amoxicillin (not sure if augmentin or not)-finished it on the 10/16, on 10/17 saw pain dr. (Dr. Posey Pronto is PCP at [facility])   Coughing, worse at night when turning on heat, thinks because of dry air Shortness of breath when being active, doing laundry, which is not too different than usual, when over-doing it feels some dyspnea but doesn't feel different from baseline, and feeling it when going to bed, now thinks it is the heat/dry air, then puts on oxygen and feels better.  At home still using O2 just at night.  Is still smoking cigarettes, sees pulmonologist 10/24 2 days had fever at night when first given the abx, no longer having fever (was subjective) Chest pain with coughing, having trouble raising sputum, when able to raise it it is green/white No leg pain or swelling No nausea, vomiting Urinary frequency chronic   Past Medical History:  Diagnosis Date   Asthma    Diabetes mellitus without complication (HCC)    GERD (gastroesophageal reflux disease)    Heart murmur    from childhood rheumatic fever, per patient   Hyperlipidemia    Pneumonia 12/14    Wears partial dentures    top and bottom partials    Home Medications Prior to Admission medications   Medication Sig Start Date End Date Taking? Authorizing Provider  ACCU-CHEK AVIVA PLUS test strip USE AS DIRECTED FOR 30 DAYS 04/07/18   [provider]  albuterol (VENTOLIN HFA) 108 (90 Base) MCG/ACT inhaler INHALE 1 TO 2 PUFFS INTO THE LUNGS EVERY 6 HOURS AS NEEDED FOR WHEEZING OR SHORTNESS OF BREATH 01/20/22   Collene Gobble, MD  Cholecalciferol (VITAMIN D) 50 MCG (2000 UT) tablet Take 2,000 Units by mouth daily.    [provider]  JANUMET 50-1000 MG tablet Take 1 tablet by mouth 2 (two) times daily. 10/03/20   [provider]  LANTUS SOLOSTAR 100 UNIT/ML Solostar Pen Inject 20 Units into the skin at bedtime. 10/26/21   Domenic Polite, MD  lisinopril-hydrochlorothiazide (ZESTORETIC) 20-25 MG tablet Take 1 tablet by mouth daily.    [provider]  methocarbamol (ROBAXIN) 500 MG tablet Take 1 tablet (500 mg total) by mouth every 8 (eight) hours as needed for muscle spasms. 10/26/21   Domenic Polite, MD  montelukast (SINGULAIR) 10 MG tablet Take 1 tablet (10 mg total) by mouth every evening. 01/02/19   Jacelyn Pi, Lilia Argue, MD  nicotine (NICODERM CQ) 7 mg/24hr patch Place 1 patch (7 mg total) onto the skin daily. 12/16/21   Collene Gobble, MD  oxyCODONE-acetaminophen (PERCOCET) 10-325 MG tablet Take 1 tablet by mouth 5 (five) times daily. 10/28/20   [provider]  pantoprazole (PROTONIX) 40 MG tablet Take 40 mg by mouth daily.    [provider]  Phenyleph-Doxylamine-DM-APAP (NYQUIL SEVERE COLD/FLU PO) Take 1 Dose by mouth See admin instructions. 2-3 times per day    [provider]  RELION PEN NEEDLE 31G/8MM 31G X 8 MM MISC Inject 1 each into the skin daily. use as directed 10/01/18   Jacelyn Pi, Lilia Argue, MD  rosuvastatin (CRESTOR) 40 MG tablet Take 40 mg by mouth every evening. 09/28/20   [provider]  sertraline  (ZOLOFT) 50 MG tablet Take 50 mg by mouth daily.    [provider]  Tiotropium Bromide-Olodaterol (STIOLTO RESPIMAT) 2.5-2.5 MCG/ACT AERS Inhale 2 puffs into the lungs daily. 02/07/22   Collene Gobble, MD      Allergies    Patient has no known allergies.    Review of Systems   Review of Systems  Physical Exam Updated Vital Signs BP (!) 113/57 (BP Location: Right Arm)   Pulse 85   Temp 98.5 F (36.9 C) (Oral)   Resp 18   LMP 03/07/2012   SpO2 100%  Physical Exam Vitals and nursing note reviewed.  Constitutional:      General: She is not in acute distress.    Appearance: She is well-developed. She is not diaphoretic.  HENT:     Head: Normocephalic and atraumatic.  Eyes:     Conjunctiva/sclera: Conjunctivae normal.  Cardiovascular:     Rate and Rhythm: Normal rate and regular rhythm.     Heart sounds: Normal heart sounds. No murmur heard.    No friction rub. No gallop.  Pulmonary:     Effort: Pulmonary effort is normal. No respiratory distress.     Breath sounds: Wheezing and rhonchi present. No rales.  Abdominal:     General: There is no distension.     Palpations: Abdomen is soft.     Tenderness: There is no abdominal tenderness. There is no guarding.  Musculoskeletal:        General: No tenderness.     Cervical back: Normal range of motion.  Skin:    General: Skin is warm and dry.     Findings: No erythema or rash.  Neurological:     Mental Status: She is alert and oriented to person, place, and time.     ED Results / Procedures / Treatments   Labs (all labs ordered are listed, but only abnormal results are displayed) Labs Reviewed  CBC WITH DIFFERENTIAL/PLATELET - Abnormal; Notable for the following components:      Result Value   WBC 15.4 (*)    MCHC 29.8 (*)    Neutro Abs 11.9 (*)    Abs Immature Granulocytes 0.08 (*)    All other components within normal limits  BASIC METABOLIC PANEL - Abnormal; Notable for the following components:    Glucose, Bld 164 (*)    Creatinine, Ser 1.15 (*)    GFR, Estimated 55 (*)    All other components within normal limits  LACTIC ACID, PLASMA - Abnormal; Notable for the following components:   Lactic Acid, Venous 2.4 (*)    All other components within normal limits  RESP PANEL BY RT-PCR (FLU A&B, COVID) [id number]  CULTURE, BLOOD (ROUTINE X 2)  CULTURE, BLOOD (ROUTINE X 2)  RESPIRATORY PANEL BY PCR  MAGNESIUM  BRAIN NATRIURETIC PEPTIDE  LACTIC ACID, PLASMA  TROPONIN I (HIGH SENSITIVITY)    EKG EKG Interpretation  Date/Time:  Sunday July 30 2022  10:49:14 EDT Ventricular Rate:  82 PR Interval:  159 QRS Duration: 89 QT Interval:  403 QTC Calculation: 471 R Axis:   55 Text Interpretation: Sinus rhythm Since prior ECG< rate has decreased Confirmed by Gareth Morgan [phone]) on 07/30/2022 3:53:11 PM  Radiology DG Chest 2 View  Result Date: 07/30/2022 CLINICAL DATA:  Shortness of breath.  Cough. EXAM: CHEST - 2 VIEW COMPARISON:  10/18/21 CXR FINDINGS: No pleural effusion. No pneumothorax. Bibasilar atelectasis. Mildly prominent interstitial opacities, particularly along the bilateral hila. Possible more focal airspace opacity in the right lower lung. normal cardiac and mediastinal contours. No displaced rib fracture.Visualized upper abdomen is unremarkable. Vertebral body heights are maintained. IMPRESSION: Mildly prominent interstitial opacities with a more focal opacity at the right lower lung. Findings could be seen in the setting of infection. Electronically Signed   By: Marin Roberts M.D.   On: 07/30/2022 11:33    Procedures Procedures    Medications Ordered in ED Medications  azithromycin (ZITHROMAX) 500 mg in sodium chloride 0.9 % 250 mL IVPB (500 mg Intravenous New Bag/Given 07/30/22 1817)  oxyCODONE-acetaminophen (PERCOCET/ROXICET) 5-325 MG per tablet 1 tablet (has no administration in time range)  oxyCODONE (Oxy IR/ROXICODONE) immediate release tablet 5 mg (has no  administration in time range)  ipratropium-albuterol (DUONEB) 0.5-2.5 (3) MG/3ML nebulizer solution 3 mL (3 mLs Nebulization Given 07/30/22 1056)  cefTRIAXone (ROCEPHIN) 1 g in sodium chloride 0.9 % 100 mL IVPB (0 g Intravenous Stopped 07/30/22 1818)  methylPREDNISolone sodium succinate (SOLU-MEDROL) 125 mg/2 mL injection 125 mg (125 mg Intravenous Given 07/30/22 1740)  ipratropium-albuterol (DUONEB) 0.5-2.5 (3) MG/3ML nebulizer solution 3 mL (3 mLs Nebulization Given 07/30/22 1740)    ED Course/ Medical Decision Making/ A&P   56 year old female with a history of COPD with asthma with exertional hypoxemia with heavier exertion and prn 2L/min O2, diabetes, hyperlipidemia history of ICU admission and intubation December to January with hypoxia secondary to pneumonia, underwent tracheostomy, history of polysubstance abuse who presents with cough and dyspnea.  Differential diagnosis for dyspnea/cough includes ACS, PE, COPD exacerbation, CHF exacerbation, anemia, pneumonia, viral etiology such as COVID 19 infection, metabolic abnormality.    X-ray completed and personally evaluated interpreted by me and shows interstitial opacities with more focal opacity in the right lower lung which may be seen with infection.  COVID and influenza testing is negative.  Labs completed and personally eval and interpreted by me show a leukocytosis of 15,000, similar to prior values, creatinine mildly increased from prior.  Troponin within normal limits and have low suspicion for ACS. Doubt PE in setting of clinical history more consistent with infection.  She does have some wheezing on exam consistent with COPD, was given Solu-Medrol and DuoNebs.  She typically uses oxygen as needed with exertion and/or at night, and does not have known hypoxia at rest.  While sitting and resting in the emergency department today, her oxygen saturations are down to 84 to 85% on room air, and improved to 90% on 2 L.  Given new/worsening  hypoxia in the setting of pneumonia that has also failed outpatient treatment with antibiotics, will admit for continued care of community-acquired pneumonia as well as COPD exacerbation.           Final Clinical Impression(s) / ED Diagnoses Final diagnoses:  Pneumonia of right lower lobe due to infectious organism  Hypoxia  COPD exacerbation ([place])    Rx / DC Orders ED Discharge Orders     None  Gareth Morgan, MD 07/30/22 [phone]

## 2022-07-30 NOTE — ED Provider Triage Note (Signed)
Emergency Medicine Provider Triage Evaluation Note  [name] , a 56 y.o. female , hx of asthma was evaluated in triage.  Pt complains of reports shortness of breath, chest discomfort, feeling warm, and having chills for the last couple weeks. Went to PCP and was prescribed amoxicillinx7 days, didn't have any relief with this. SOB has been getting worse. Reports productive and dry cough at times. Feels very weak and fatigued. Wears 3L O2 PRN. Chest pain is worse when taking a deep breath and coughing. Has been using albuterol 2-3x/day since being ill.    Review of Systems  Positive: Shortness of breath, chest discomfort Negative: Nausea, vomiting, abdominal pain  Physical Exam  BP (!) 102/90 (BP Location: Left Arm)   Pulse 81   Temp 98.1 F (36.7 C) (Oral)   Resp 14   LMP 03/07/2012   SpO2 92%  Gen:   Awake, no distress   Resp:  +3L O2, rhonchi in bilateral upper lobes MSK:   Moves extremities without difficulty, ttp of chest wall  Medical Decision Making  Medically screening exam initiated at 10:30 AM.  Appropriate orders placed.  [name] was informed that the remainder of the evaluation will be completed by another provider, this initial triage assessment does not replace that evaluation, and the importance of remaining in the ED until their evaluation is complete.   Osvaldo Shipper, [place] 07/30/22 1035

## 2022-07-30 NOTE — ED Notes (Addendum)
Pt O2 sats were noted to be 84% on room air. O2 sats on oxygen were noted to fluctuate between 88%-93% on 3 LPM

## 2022-07-30 NOTE — Assessment & Plan Note (Signed)
BMI 31 °

## 2022-07-30 NOTE — ED Triage Notes (Signed)
Pt presents with c/o shortness of breath that started today. Pt reports that she has had a cough for a couple of weeks, went to her PCP and was given antibiotics. Pt reports that last night she began to have cold chills and sweats. Per EMS, rhonchi heard in all fields. Fire department gave a '5mg'$  nebulizer of albuterol on scene. Pt has a 20 g IV in her right hand started by EMS and given 125 mg of solumedrol en route.

## 2022-07-30 NOTE — Assessment & Plan Note (Addendum)
Sepsis ruled out.  Likely lactic acid 2.4 is from COPD/asthma flare.  No orthopnea or swelling to suggest CHF - Start steroids - Start bronchodilators, scheduled and PRN - Continue antibiotics

## 2022-07-30 NOTE — Assessment & Plan Note (Signed)
Asymptomatic. 

## 2022-07-30 NOTE — Assessment & Plan Note (Signed)
Has seen Dr. Chryl Heck.  Work up to date has not revealed etiology.

## 2022-07-30 NOTE — Hospital Course (Signed)
[name] is a 56 y.o. F with asthma/COPD overlap, chronic respiratory failure on O2 at night and prn, obesity, DM, chronic pancreatitis and chronic leukocytosis who presented with few weeks cough, then progressed to sweats, subjective fever, and shortness of breath.  In the ER, SpO2 85% on room air, CXR with pneumonia like infiltrates, COVID-.

## 2022-07-30 NOTE — Assessment & Plan Note (Signed)
Uses O2 PRN with exertion since her severe respiratory failure (requiring tracheostomy) last year.

## 2022-07-30 NOTE — Assessment & Plan Note (Signed)
-   Check A1c - Continue glargine - Hold sitagliptin-metformin - SS corrections

## 2022-07-30 NOTE — Assessment & Plan Note (Signed)
-   Continue Rocephin and azithromycin

## 2022-07-31 ENCOUNTER — Other Ambulatory Visit (HOSPITAL_COMMUNITY): Payer: Self-pay

## 2022-07-31 DIAGNOSIS — J441 Chronic obstructive pulmonary disease with (acute) exacerbation: Secondary | ICD-10-CM | POA: Diagnosis not present

## 2022-07-31 LAB — RESPIRATORY PANEL BY PCR

## 2022-07-31 LAB — CBG MONITORING, ED: Glucose-Capillary: 237 mg/dL — ABNORMAL HIGH (ref 70–99)

## 2022-07-31 LAB — HEMOGLOBIN A1C
Hgb A1c MFr Bld: 9.3 % — ABNORMAL HIGH (ref 4.8–5.6)
Mean Plasma Glucose: 220.21 mg/dL

## 2022-07-31 MED ORDER — AZITHROMYCIN 250 MG PO TABS
ORAL_TABLET | ORAL | 0 refills | Status: AC
  Filled 2022-07-31: qty 6, 5d supply, fill #0

## 2022-07-31 MED ORDER — ALBUTEROL SULFATE (2.5 MG/3ML) 0.083% IN NEBU
2.5000 mg | INHALATION_SOLUTION | Freq: Four times a day (QID) | RESPIRATORY_TRACT | 12 refills | Status: AC | PRN
  Filled 2022-07-31: qty 90, 8d supply, fill #0

## 2022-07-31 MED ORDER — CEFDINIR 300 MG PO CAPS
300.0000 mg | ORAL_CAPSULE | Freq: Two times a day (BID) | ORAL | 0 refills | Status: DC
  Filled 2022-07-31: qty 8, 4d supply, fill #0

## 2022-07-31 MED ORDER — PREDNISONE 20 MG PO TABS
40.0000 mg | ORAL_TABLET | Freq: Every day | ORAL | 0 refills | Status: AC
  Filled 2022-07-31: qty 8, 4d supply, fill #0

## 2022-07-31 NOTE — Care Management CC44 (Signed)
Condition Code 44 Documentation Completed  Patient Details  Name: Suda Forbess MRN: [id number] Date of Birth: 1966/04/18   Condition Code 44 given:  Yes Patient signature on Condition Code 44 notice:  Yes Documentation of 2 MD's agreement:  Yes Code 44 added to claim:  Yes    Kimber Relic, LCSW 07/31/2022, 11:03 AM

## 2022-07-31 NOTE — Care Management Obs Status (Signed)
[facility] NOTIFICATION   Patient Details  Name: [name] MRN: [id number] Date of Birth: [birth year]   Medicare Observation Status Notification Given:  Yes    Kimber Relic, LCSW 07/31/2022, 11:03 AM

## 2022-07-31 NOTE — Discharge Summary (Signed)
Physician Discharge Summary   Patient: Natalyn Blandino MRN: [id number] DOB: 06/18/1966  Admit date:     07/30/2022  Discharge date: 07/31/22  Discharge Physician:  P    PCP: [facility]     Recommendations at discharge:  Follow-up with PCP [facility] in 1 week [facility]: Please reconcile patient's olmesartan and lisinopril --> prefer ARB in this patient  With follow-up with pulmonology, Dr. Byrum's office in 2 to 3 weeks for COPD flare     Discharge Diagnoses: Principal Problem:   COPD exacerbation (HCC) Active Problems:   Atypical pneumonia   Tobacco use   Uncontrolled type 2 diabetes mellitus with hyperglycemia (HCC)   Essential hypertension, benign   Chronic midline low back pain without sciatica   Chronic pancreatitis, unspecified pancreatitis type (HCC)   Chronic respiratory failure with hypoxia (HCC)   Obesity (BMI 30-39.9)   Chronic leukocytosis      Hospital Course: Mrs. Kalmar is a 56 y.o. F with asthma/COPD overlap, chronic respiratory failure on O2 at night and prn, obesity, DM, chronic pancreatitis and chronic leukocytosis who presented with few weeks cough, then progressed to sweats, subjective fever, and shortness of breath.  In the ER, SpO2 85% on room air, CXR with pneumonia like infiltrates, COVID-.      * COPD exacerbation  Pneumonia Patient had elevated lactic acid which was much more likely from COPD and albuterol use, sepsis was considered unlikely.  She was treated with steroids, antibiotics, and felt improved and was eager for discharge.    Patient now mentating at baseline, taking orals.  Temp < 100 F, heart rate < 100bpm, RR < 24, SpO2 close to baseline.   Stable for discharge.                 The [place] Controlled Substances Registry was reviewed for this patient prior to discharge.   Consultants: None  Procedures performed: None  Disposition: Home   DISCHARGE  MEDICATION: Allergies as of 07/31/2022   No Known Allergies      Medication List     STOP taking these medications    Jardiance 25 MG Tabs tablet Generic drug: empagliflozin   lisinopril-hydrochlorothiazide 20-25 MG tablet Commonly known as: ZESTORETIC   NYQUIL SEVERE COLD/FLU PO       TAKE these medications    Accu-Chek Aviva Plus test strip Generic drug: glucose blood USE AS DIRECTED FOR 30 DAYS   albuterol 108 (90 Base) MCG/ACT inhaler Commonly known as: VENTOLIN HFA INHALE 1 TO 2 PUFFS INTO THE LUNGS EVERY 6 HOURS AS NEEDED FOR WHEEZING OR SHORTNESS OF BREATH What changed: See the new instructions.   albuterol (2.5 MG/3ML) 0.083% nebulizer solution Commonly known as: PROVENTIL Take 3 mLs (2.5 mg total) by nebulization every 6 (six) hours as needed for shortness of breath. What changed: You were already taking a medication with the same name, and this prescription was added. Make sure you understand how and when to take each.   ALPRAZolam 0.5 MG tablet Commonly known as: XANAX Take 0.5 mg by mouth at bedtime.   azithromycin 250 MG tablet Commonly known as: Zithromax Z-Pak Take 2 tablets (500 mg) on  Day 1,  followed by 1 tablet (250 mg) once daily on Days 2 through 4.   baclofen 10 MG tablet Commonly known as: LIORESAL Take 10 mg by mouth 3 (three) times daily as needed for muscle spasms.   cefdinir 300 MG capsule Commonly known as: OMNICEF Take   1 capsule (300 mg total) by mouth 2 (two) times daily.   Janumet 50-1000 MG tablet Generic drug: sitaGLIPtin-metformin Take 1 tablet by mouth 2 (two) times daily.   Lantus SoloStar 100 UNIT/ML Solostar Pen Generic drug: insulin glargine Inject 20 Units into the skin at bedtime. What changed: how much to take   naloxone 4 MG/0.1ML Liqd nasal spray kit Commonly known as: NARCAN Place 0.4 mg into the nose as needed (as directed for opioid emergency).   NICODERM CQ TD Place 1 patch onto the skin daily as  needed (for smoking cessation while hospitalized).   olmesartan 20 MG tablet Commonly known as: BENICAR Take 20 mg by mouth daily.   oxyCODONE-acetaminophen 10-325 MG tablet Commonly known as: PERCOCET Take 1 tablet by mouth 5 (five) times daily.   pantoprazole 40 MG tablet Commonly known as: PROTONIX Take 40 mg by mouth daily before breakfast.   predniSONE 20 MG tablet Commonly known as: DELTASONE Take 2 tablets (40 mg total) by mouth daily with breakfast for 4 days. Start taking on: August 01, 2022   RELION PEN NEEDLE 31G/8MM 31G X 8 MM Misc Generic drug: Insulin Pen Needle Inject 1 each into the skin daily. use as directed   rosuvastatin 40 MG tablet Commonly known as: CRESTOR Take 40 mg by mouth every evening.   sertraline 100 MG tablet Commonly known as: ZOLOFT Take 100 mg by mouth in the morning.   Stiolto Respimat 2.5-2.5 MCG/ACT Aers Generic drug: Tiotropium Bromide-Olodaterol Inhale 2 puffs into the lungs daily. What changed:  when to take this reasons to take this   traZODone 50 MG tablet Commonly known as: DESYREL Take 50-100 mg by mouth every evening.   Vitamin D 50 MCG (2000 UT) tablet Take 2,000 Units by mouth daily.        Follow-up Information     [facility] Follow up.   Contact information: [address] [place] [address] [phone]         Byrum, Robert S, MD. Schedule an appointment as soon as possible for a visit in 2 week(s).   Specialty: Pulmonary Disease Contact information: [address] [place] [address] [phone]                 Discharge Instructions     Discharge instructions   Complete by: As directed    **IMPORTANT DISCHARGE INSTRUCTIONS**   From Dr. : You were admitted for trouble breathing Here, we found that this was from an exacerbation of your asthma and COPD as well as from a pneumonia.  The COPD flare is treated with steroids and  albuterol  The pneumonia is treated with Antibiotics.  For the COPD flare: Take prednisone 40 mg (two tabs) once daily for the next 5 days (including the dose you got in the ER today) For the next week, use the albuterol liquid in the nebulizer (or your home albuterol pump) three times daily  You can use it in between if you really need, but if you are using more than every 2 hours, you should come back to the ER Lastly and most importantly, stop smoking    For the pneumonia: Take the antibiotics cefdinir and azithromycin for 4 more days Take cefdinir/Omnicef 300 mg twice daily starting tonight Take azithromycin once daily as directed on the package for 4 more days starting tonight   For the next few days, use your home insulin and Janumet, but do not take your   Jardiance.  YOu can restart it Weds or Thurs  Lastly, for your blood pressure, do not take both olmesartan and lisinopril/HCTZ Take one or the other and ask your primary care doctor about it Go see your primary care in 1 week and Dr. Agustina Caroli office in 2 weeks   Increase activity slowly   Complete by: As directed        Discharge Exam: There were no vitals filed for this visit.  General: Pt is alert, awake, not in acute distress Cardiovascular: RRR, nl S1-S2, no murmurs appreciated.   No LE edema.   Respiratory: Normal respiratory rate and rhythm.  Tight and with very minimal wheeze, just after nebulized bronchodilator treatment. Abdominal: Abdomen soft and non-tender.  No distension or HSM.   Neuro/Psych: Strength symmetric in upper and lower extremities.  Judgment and insight appear normal .   Condition at discharge: good  The results of significant diagnostics from this hospitalization (including imaging, microbiology, ancillary and laboratory) are listed below for reference.   Imaging Studies: DG Chest 2 View  Result Date: 07/30/2022 CLINICAL DATA:  Shortness of breath.  Cough. EXAM: CHEST - 2 VIEW COMPARISON:   10/18/21 CXR FINDINGS: No pleural effusion. No pneumothorax. Bibasilar atelectasis. Mildly prominent interstitial opacities, particularly along the bilateral hila. Possible more focal airspace opacity in the right lower lung. normal cardiac and mediastinal contours. No displaced rib fracture.Visualized upper abdomen is unremarkable. Vertebral body heights are maintained. IMPRESSION: Mildly prominent interstitial opacities with a more focal opacity at the right lower lung. Findings could be seen in the setting of infection. Electronically Signed   By: Marin Roberts M.D.   On: 07/30/2022 11:33    Microbiology: Results for orders placed or performed during the hospital encounter of 07/30/22  Resp Panel by RT-PCR (Flu A&B, Covid) Anterior Nasal Swab     Status: None   Collection Time: 07/30/22 11:31 AM   Specimen: Anterior Nasal Swab  Result Value Ref Range Status   SARS Coronavirus 2 by RT PCR NEGATIVE NEGATIVE Final    Comment: (NOTE) SARS-CoV-2 target nucleic acids are NOT DETECTED.  The SARS-CoV-2 RNA is generally detectable in upper respiratory specimens during the acute phase of infection. The lowest concentration of SARS-CoV-2 viral copies this assay can detect is 138 copies/mL. A negative result does not preclude SARS-Cov-2 infection and should not be used as the sole basis for treatment or other patient management decisions. A negative result may occur with  improper specimen collection/handling, submission of specimen other than nasopharyngeal swab, presence of viral mutation(s) within the areas targeted by this assay, and inadequate number of viral copies(<138 copies/mL). A negative result must be combined with clinical observations, patient history, and epidemiological information. The expected result is Negative.  Fact Sheet for Patients:  EntrepreneurPulse.com.au  Fact Sheet for Healthcare Providers:  IncredibleEmployment.be  This test is no  t yet approved or cleared by the [place] FDA and  has been authorized for detection and/or diagnosis of SARS-CoV-2 by FDA under an Emergency Use Authorization (EUA). This EUA will remain  in effect (meaning this test can be used) for the duration of the COVID-19 declaration under Section 564(b)(1) of the Act, 21 U.S.C.section 360bbb-3(b)(1), unless the authorization is terminated  or revoked sooner.       Influenza A by PCR NEGATIVE NEGATIVE Final   Influenza B by PCR NEGATIVE NEGATIVE Final    Comment: (NOTE) The Xpert Xpress SARS-CoV-2/FLU/RSV plus assay is intended as an aid in the diagnosis of  influenza from Nasopharyngeal swab specimens and should not be used as a sole basis for treatment. Nasal washings and aspirates are unacceptable for Xpert Xpress SARS-CoV-2/FLU/RSV testing.  Fact Sheet for Patients: https://www.fda.gov/media/152166/download  Fact Sheet for Healthcare Providers: https://www.fda.gov/media/152162/download  This test is not yet approved or cleared by the [place] FDA and has been authorized for detection and/or diagnosis of SARS-CoV-2 by FDA under an Emergency Use Authorization (EUA). This EUA will remain in effect (meaning this test can be used) for the duration of the COVID-19 declaration under Section 564(b)(1) of the Act, 21 U.S.C. section 360bbb-3(b)(1), unless the authorization is terminated or revoked.  Performed at [facility], [address] [place], [place] [address]   Blood culture (routine x 2)     Status: None (Preliminary result)   Collection Time: 07/30/22  4:35 PM   Specimen: BLOOD  Result Value Ref Range Status   Specimen Description   Final    BLOOD BLOOD LEFT FOREARM Performed at [facility], [address] [place], [place] [address]    Special Requests   Final    BOTTLES DRAWN AEROBIC AND ANAEROBIC Blood Culture results may not be optimal due to an excessive volume of blood  received in culture bottles Performed at [facility], [address] [place], [place] [address]    Culture   Final    NO GROWTH < 24 HOURS Performed at [facility], [address] [place], [place] [address]    Report Status PENDING  Incomplete  Blood culture (routine x 2)     Status: None (Preliminary result)   Collection Time: 07/30/22  4:35 PM   Specimen: BLOOD  Result Value Ref Range Status   Specimen Description   Final    BLOOD BLOOD LEFT FOREARM Performed at [facility], [address] [place], [place] [address]    Special Requests   Final    BOTTLES DRAWN AEROBIC AND ANAEROBIC Blood Culture adequate volume Performed at [facility], [address] [place], [place] [address]    Culture   Final    NO GROWTH < 24 HOURS Performed at [facility], [address] [place],  [address]    Report Status PENDING  Incomplete  Respiratory (~20 pathogens) panel by PCR     Status: None   Collection Time: 07/30/22  8:52 PM   Specimen: Nasopharyngeal Swab; Respiratory  Result Value Ref Range Status   Adenovirus NOT DETECTED NOT DETECTED Final   Coronavirus 229E NOT DETECTED NOT DETECTED Final    Comment: (NOTE) The Coronavirus on the Respiratory Panel, DOES NOT test for the novel  Coronavirus (2019 nCoV)    Coronavirus HKU1 NOT DETECTED NOT DETECTED Final   Coronavirus NL63 NOT DETECTED NOT DETECTED Final   Coronavirus OC43 NOT DETECTED NOT DETECTED Final   Metapneumovirus NOT DETECTED NOT DETECTED Final   Rhinovirus / Enterovirus NOT DETECTED NOT DETECTED Final   Influenza A NOT DETECTED NOT DETECTED Final   Influenza B NOT DETECTED NOT DETECTED Final   Parainfluenza Virus 1 NOT DETECTED NOT DETECTED Final   Parainfluenza Virus 2 NOT DETECTED NOT DETECTED Final   Parainfluenza Virus 3 NOT DETECTED NOT DETECTED Final   Parainfluenza Virus 4 NOT DETECTED NOT DETECTED Final   Respiratory Syncytial  Virus NOT DETECTED NOT DETECTED Final   Bordetella pertussis NOT DETECTED NOT DETECTED Final   Bordetella Parapertussis NOT DETECTED NOT DETECTED Final     Chlamydophila pneumoniae NOT DETECTED NOT DETECTED Final   Mycoplasma pneumoniae NOT DETECTED NOT DETECTED Final    Comment: Performed at [facility], [place] [address] [place], [place] [address]    Labs: CBC: Recent Labs  Lab 07/30/22 1115  WBC 15.4*  NEUTROABS 11.9*  HGB 13.2  HCT 44.3  MCV 91.3  PLT 803   Basic Metabolic Panel: Recent Labs  Lab 07/30/22 1115  NA 138  K 4.4  CL 105  CO2 25  GLUCOSE 164*  BUN 11  CREATININE 1.15*  CALCIUM 9.2  MG 2.1   Liver Function Tests: No results for input(s): "AST", "ALT", "ALKPHOS", "BILITOT", "PROT", "ALBUMIN" in the last 168 hours. CBG: Recent Labs  Lab 07/30/22 2312 07/31/22 0834  GLUCAP 284* 237*    Discharge time spent: approximately 25 minutes spent on discharge counseling, evaluation of patient on day of discharge, and coordination of discharge planning with nursing, social work, pharmacy and case management  Signed: Edwin Dada, MD [facility] 07/31/2022

## 2022-08-01 ENCOUNTER — Encounter: Payer: Self-pay | Admitting: Emergency Medicine

## 2022-08-01 ENCOUNTER — Ambulatory Visit: Payer: Medicare HMO | Admitting: Emergency Medicine

## 2022-08-01 ENCOUNTER — Ambulatory Visit (INDEPENDENT_AMBULATORY_CARE_PROVIDER_SITE_OTHER): Payer: Medicare HMO | Admitting: Emergency Medicine

## 2022-08-01 VITALS — BP 136/60 | HR 80 | Temp 98.3°F | Ht 63.0 in | Wt 184.0 lb

## 2022-08-01 DIAGNOSIS — J9611 Chronic respiratory failure with hypoxia: Secondary | ICD-10-CM | POA: Diagnosis not present

## 2022-08-01 DIAGNOSIS — J441 Chronic obstructive pulmonary disease with (acute) exacerbation: Secondary | ICD-10-CM | POA: Diagnosis not present

## 2022-08-01 DIAGNOSIS — J4489 Other specified chronic obstructive pulmonary disease: Secondary | ICD-10-CM | POA: Diagnosis not present

## 2022-08-01 MED ORDER — OLMESARTAN MEDOXOMIL-HCTZ 40-25 MG PO TABS: 1.0000 | ORAL_TABLET | Freq: Every day | ORAL | 3 refills | Status: DC

## 2022-08-01 NOTE — Progress Notes (Signed)
Lpha

## 2022-08-01 NOTE — Patient Instructions (Signed)
We will change her lisinopril/HCTZ to olmesartan/HCTZ.  A new prescription has been sent to your pharmacy for the new blood pressure medication.  Please discuss this change with your primary providers at  [facility]. Congratulations on stopping smoking.  Work hard to avoid restarting Continue Stiolto 2 puffs once daily. Keep your albuterol available to use 2 puffs when needed for shortness of breath, chest tightness, wheezing. You can use albuterol nebulizer, 1 up to every 4 hours if needed for shortness of breath, chest tightness, wheezing.  We will give you a prescription for a nebulizer machine. Finish your current prednisone prescription until completely gone Finish your antibiotic prescriptions until completely gone Flu shot is up-to-date Consider getting the COVID-19 shot this fall Follow with APP in 2 months to assess your status Follow Dr. Lamonte Sakai in 6 months or sooner if you have any problems.

## 2022-08-01 NOTE — Assessment & Plan Note (Signed)
We will change her lisinopril/HCTZ to olmesartan/HCTZ.  A new prescription has been sent to your pharmacy for the new blood pressure medication.  Please discuss this change with your primary providers at [facility]. Congratulations on stopping smoking.  Work hard to avoid restarting Continue Stiolto 2 puffs once daily. Keep your albuterol available to use 2 puffs when needed for shortness of breath, chest tightness, wheezing. You can use albuterol nebulizer, 1 up to every 4 hours if needed for shortness of breath, chest tightness, wheezing.  We will give you a prescription for a nebulizer machine. Flu shot is up-to-date Consider getting the COVID-19 shot this fall Follow with APP in 2 months to assess your status Follow Dr. Lamonte Sakai in 6 months or sooner if you have any problems.

## 2022-08-01 NOTE — Assessment & Plan Note (Signed)
Improved.  Finishing prednisone taper and cefdinir/azithromycin.  No wheezing on exam today.

## 2022-08-01 NOTE — Addendum Note (Signed)
Addended by: Loma Sousa on: 08/01/2022 10:28 AM   Modules accepted: Orders

## 2022-08-01 NOTE — Progress Notes (Signed)
Subjective:    Patient ID: [name], female    DOB: 10/23/1965, 56 y.o.   MRN: [id number]  HPI  ROV 02/07/22 --follow-up visit 56 year old woman, active smoker (30 pack years), COPD/asthma.  She was hospitalized January 2023 for CAP and obstructive lung disease flare that required tracheostomy, now decannulated and on oxygen 2 L/min.  At our initial visit I started her on Stiolto to see if she would get benefit.  We discussed smoking cessation.  She underwent pulmonary function testing today as below.  Today she reports that she felt that she benefited from the [facility], has helped her functional capacity. She is still smoking about 6 cig a day. Has some intermittent cough. She has not been using O2 every day, sometimes when she exerts. She has continued to sometimes seen some desats below 90% with heavier activity.   Pulmonary function testing performed today and reviewed by me show mixed obstruction and restriction.  FEV1 is 1.34 L (64% predicted).  No bronchodilator response.  Lung volumes confirm restriction.  Decreased diffusion capacity that corrects to the normal range when adjusted for alveolar volume.  ROV 08/01/22 --follow-up visit 56 year old woman with a history of active tobacco use, COPD/asthma.  She has had multiple hospitalizations for exacerbations/pneumonias with a severe exacerbation 10/2021 requiring tracheostomy (now decannulated).  She has oxygen 2 L/min with heavy exertion.  She was just briefly hospitalized with an acute exacerbation of her obstructive lung disease.  Her COVID-19 was negative.  She was treated with steroids, antibiotics.  Of note there was consideration of changing her lisinopril/HCTZ to olmesartan - not sure that she has a script for this yet.    Review of Systems As per HPI  Past Medical History:  Diagnosis Date   Asthma    COPD (chronic obstructive pulmonary disease) ([place])    Diabetes mellitus without complication (HCC)    GERD (gastroesophageal  reflux disease)    Heart murmur    from childhood rheumatic fever, per patient   Hyperlipidemia    Pneumonia 09/2013   Wears partial dentures    top and bottom partials     Family History  Problem Relation Age of Onset   Diabetes Mother    Heart disease Mother      Social History   Socioeconomic History   Marital status: Married    Spouse name: Not on file   Number of children: Not on file   Years of education: Not on file   Highest education level: Not on file  Occupational History   Not on file  Tobacco Use   Smoking status: Every Day    Types: Cigarettes    Last attempt to quit: 08/30/2016    Years since quitting: 5.9   Smokeless tobacco: Never   Tobacco comments:    5 -6 cigarettes  smoked daily [facility]J 02/07/22  Substance and Sexual Activity   Alcohol use: No    Comment: beer everynow and then per patient   Drug use: No   Sexual activity: Not Currently    Comment: says she quit 12/14  Other Topics Concern   Not on file  Social History Narrative   ** Merged History Encounter **       Social Determinants of Health   Financial Resource Strain: Not on file  Food Insecurity: Not on file  Transportation Needs: Not on file  Physical Activity: Not on file  Stress: Not on file  Social Connections: Not on file  Intimate Partner Violence: Not  on file     No Known Allergies   Outpatient Medications Prior to Visit  Medication Sig Dispense Refill   ACCU-CHEK AVIVA PLUS test strip USE AS DIRECTED FOR 30 DAYS  6   albuterol (PROVENTIL) (2.5 MG/3ML) 0.083% nebulizer solution Inhale 1 vial via nebulizer every 6 (six) hours as needed for shortness of breath. 90 mL 12   albuterol (VENTOLIN HFA) 108 (90 Base) MCG/ACT inhaler INHALE 1 TO 2 PUFFS INTO THE LUNGS EVERY 6 HOURS AS NEEDED FOR WHEEZING OR SHORTNESS OF BREATH (Patient taking differently: Inhale 1-2 puffs into the lungs every 6 (six) hours as needed for wheezing or shortness of breath.) 6.7 g 5   ALPRAZolam (XANAX)  0.5 MG tablet Take 0.5 mg by mouth at bedtime.     azithromycin (ZITHROMAX Z-PAK) 250 MG tablet Take 2 tablets (500 mg) on  Day 1,  followed by 1 tablet (250 mg) once daily on Days 2 through 5 6 each 0   baclofen (LIORESAL) 10 MG tablet Take 10 mg by mouth 3 (three) times daily as needed for muscle spasms.     cefdinir (OMNICEF) 300 MG capsule Take 1 capsule (300 mg total) by mouth 2 (two) times daily. 8 capsule 0   Cholecalciferol (VITAMIN D) 50 MCG (2000 UT) tablet Take 2,000 Units by mouth daily.     JANUMET 50-1000 MG tablet Take 1 tablet by mouth 2 (two) times daily.     LANTUS SOLOSTAR 100 UNIT/ML Solostar Pen Inject 20 Units into the skin at bedtime. (Patient taking differently: Inject 65 Units into the skin at bedtime.)     naloxone (NARCAN) nasal spray 4 mg/0.1 mL Place 0.4 mg into the nose as needed (as directed for opioid emergency).     Nicotine (NICODERM CQ TD) Place 1 patch onto the skin daily as needed (for smoking cessation while hospitalized).     oxyCODONE-acetaminophen (PERCOCET) 10-325 MG tablet Take 1 tablet by mouth 5 (five) times daily.     pantoprazole (PROTONIX) 40 MG tablet Take 40 mg by mouth daily before breakfast.     predniSONE (DELTASONE) 20 MG tablet Take 2 tablets (40 mg total) by mouth daily with breakfast for 4 days. 8 tablet 0   RELION PEN NEEDLE 31G/8MM 31G X 8 MM MISC Inject 1 each into the skin daily. use as directed 100 each 2   rosuvastatin (CRESTOR) 40 MG tablet Take 40 mg by mouth every evening.     sertraline (ZOLOFT) 100 MG tablet Take 100 mg by mouth in the morning.     Tiotropium Bromide-Olodaterol (STIOLTO RESPIMAT) 2.5-2.5 MCG/ACT AERS Inhale 2 puffs into the lungs daily. (Patient taking differently: Inhale 2 puffs into the lungs daily as needed (for flares).) 4 g 5   traZODone (DESYREL) 50 MG tablet Take 50-100 mg by mouth every evening.     olmesartan (BENICAR) 20 MG tablet Take 20 mg by mouth daily.     No facility-administered medications prior  to visit.         Objective:   Physical Exam Vitals:   08/01/22 0848  BP: 136/60  Pulse: 80  Temp: 98.3 F (36.8 C)  TempSrc: Oral  SpO2: 91%  Weight: 184 lb (83.5 kg)  Height: '5\' 3"'$  (1.6 m)   Gen: Pleasant, well-nourished, in no distress,  normal affect  ENT: No lesions,  mouth clear,  oropharynx clear, no postnasal drip, strong voice  Neck: No JVD, no stridor, stoma looks good, well-healed  Lungs: No use of  accessory muscles, distant, no crackles or wheezing on normal respiration, no wheeze on forced expiration  Cardiovascular: RRR, heart sounds normal, no murmur or gallops, no peripheral edema  Musculoskeletal: No deformities, no cyanosis or clubbing  Neuro: alert, awake, non focal  Skin: Warm, no lesions or rash      Assessment & Plan:  COPD exacerbation ([place]) Improved.  Finishing prednisone taper and cefdinir/azithromycin.  No wheezing on exam today.  COPD with asthma ([place]) We will change her lisinopril/HCTZ to olmesartan/HCTZ.  A new prescription has been sent to your pharmacy for the new blood pressure medication.  Please discuss this change with your primary providers at [facility]. Congratulations on stopping smoking.  Work hard to avoid restarting Continue Stiolto 2 puffs once daily. Keep your albuterol available to use 2 puffs when needed for shortness of breath, chest tightness, wheezing. You can use albuterol nebulizer, 1 up to every 4 hours if needed for shortness of breath, chest tightness, wheezing.  We will give you a prescription for a nebulizer machine. Flu shot is up-to-date Consider getting the COVID-19 shot this fall Follow with APP in 2 months to assess your status Follow Dr. Lamonte Sakai in 6 months or sooner if you have any problems.  Baltazar Apo, MD, PhD 08/01/2022, 9:13 AM [facility] [phone] or if no answer before 7:00PM call [phone] For any issues after 7:00PM please call eLink [phone]

## 2022-08-04 LAB — CULTURE, BLOOD (ROUTINE X 2)
Culture: NO GROWTH
Culture: NO GROWTH
Special Requests: ADEQUATE

## 2022-08-14 ENCOUNTER — Telehealth: Payer: Self-pay | Admitting: *Deleted

## 2022-08-14 NOTE — Telephone Encounter (Signed)
Patient has questions about [employer] and [employer]. States [employer] will not cover portable oxygen tank. Please advise.   Call patient at [phone] or spouse [phone]

## 2022-08-16 ENCOUNTER — Other Ambulatory Visit: Payer: Self-pay | Admitting: Emergency Medicine

## 2022-08-30 ENCOUNTER — Ambulatory Visit: Payer: Medicare HMO | Admitting: Emergency Medicine

## 2022-09-25 ENCOUNTER — Ambulatory Visit: Payer: Medicare HMO | Admitting: Primary Care

## 2022-10-11 ENCOUNTER — Ambulatory Visit: Payer: Medicare HMO | Admitting: Primary Care

## 2022-12-06 ENCOUNTER — Telehealth: Payer: Self-pay | Admitting: Emergency Medicine

## 2022-12-06 NOTE — Telephone Encounter (Signed)
Mardene Celeste called back. She stated that the patient has [employer] and will need to translation her oxygen services to [employer]. Therefore the patient will need to have a new order sent to them. I advised her that I would place the order and have RB to sign.   Called patient to give her a heads up and to confirm her oxygen usage but she did not answer. Left message for patient to call back.

## 2022-12-06 NOTE — Telephone Encounter (Signed)
Called Patricia at [employer]. She did not have any knowledge of this patient but will take a look at her chart. She will give me a call back. Will keep this encounter open for follow up.

## 2022-12-06 NOTE — Telephone Encounter (Signed)
[employer] [facility] calling to get new order for O2 concentrator sent to them please talk to patricia when you call back

## 2022-12-10 NOTE — Telephone Encounter (Signed)
Could we try reaching back out to this pt.

## 2023-01-19 ENCOUNTER — Encounter: Payer: Self-pay | Admitting: Emergency Medicine

## 2023-01-19 ENCOUNTER — Ambulatory Visit (INDEPENDENT_AMBULATORY_CARE_PROVIDER_SITE_OTHER): Payer: Medicare HMO | Admitting: Emergency Medicine

## 2023-01-19 VITALS — BP 124/76 | HR 78 | Temp 97.8°F | Ht 64.0 in | Wt 177.6 lb

## 2023-01-19 DIAGNOSIS — Z720 Tobacco use: Secondary | ICD-10-CM | POA: Diagnosis not present

## 2023-01-19 DIAGNOSIS — K219 Gastro-esophageal reflux disease without esophagitis: Secondary | ICD-10-CM

## 2023-01-19 DIAGNOSIS — J441 Chronic obstructive pulmonary disease with (acute) exacerbation: Secondary | ICD-10-CM | POA: Diagnosis not present

## 2023-01-19 DIAGNOSIS — J309 Allergic rhinitis, unspecified: Secondary | ICD-10-CM | POA: Insufficient documentation

## 2023-01-19 DIAGNOSIS — J301 Allergic rhinitis due to pollen: Secondary | ICD-10-CM | POA: Diagnosis not present

## 2023-01-19 MED ORDER — PREDNISONE 10 MG PO TABS: ORAL_TABLET | ORAL | 0 refills | Status: DC

## 2023-01-19 MED ORDER — DOXYCYCLINE HYCLATE 100 MG PO TABS: 100.0000 mg | ORAL_TABLET | Freq: Two times a day (BID) | ORAL | 0 refills | Status: DC

## 2023-01-19 NOTE — Progress Notes (Signed)
Subjective:    Patient ID: [name], female    DOB: Dec 05, 1965, 57 y.o.   MRN: [id number]  HPI  ROV 08/01/22 --follow-up visit 57 year old woman with a history of active tobacco use, COPD/asthma.  She has had multiple hospitalizations for exacerbations/pneumonias with a severe exacerbation 10/2021 requiring tracheostomy (now decannulated).  She has oxygen 2 L/min with heavy exertion.  She was just briefly hospitalized with an acute exacerbation of her obstructive lung disease.  Her COVID-19 was negative.  She was treated with steroids, antibiotics.  Of note there was consideration of changing her lisinopril/HCTZ to olmesartan - not sure that she has a script for this yet.   ROV 01/19/2023 --[name] is a 57 with a history of smoking, COPD/asthma with mixed disease on pulmonary function testing.  She has frequent flares and has had multiple hospitalizations for exacerbations and pneumonia including a severe exacerbation 10/2021 that required hospitalization, tracheostomy (now decannulated).  She uses oxygen 2 L/min but only with heavy exertion. She has been having more mucous and more coughing for over a week. No fevers, sick contacts. Productive cough yellow/green. She has been using albuterol more frequently - almost every day. Some increased SOB as well.  She reports that she has not been smoking since October 2023!   Review of Systems As per HPI      Objective:   Physical Exam Vitals:   01/19/23 0956  BP: 124/76  Pulse: 78  Temp: 97.8 F (36.6 C)  TempSrc: Oral  SpO2: 94%  Weight: 177 lb 9.6 oz (80.6 kg)  Height: 5\' 4"  (1.626 m)   Gen: Pleasant, well-nourished, in no distress,  normal affect  ENT: No lesions,  mouth clear,  oropharynx clear, no postnasal drip, strong voice  Neck: No JVD, no stridor, stoma looks good, well-healed  Lungs: No use of accessory muscles, distant, coarse midlung expiratory wheezing  Cardiovascular: RRR, heart sounds normal, no murmur or gallops,  no peripheral edema  Musculoskeletal: No deformities, no cyanosis or clubbing  Neuro: alert, awake, non focal  Skin: Warm, no lesions or rash      Assessment & Plan:  COPD exacerbation (HCC) COPD with an acute flare that sounds like it was precipitated by allergic rhinitis and the season.  No other viral symptoms or viral prodrome, sick contacts.  Will treat her with a course of doxycycline, prednisone.  Continue her Stiolto but if she shows evidence that she is flaring frequently, more than 2 times annually, then we need to add an ICS.  Please take doxycycline 100 g twice a day until completely gone Please take prednisone as directed until completely gone We will continue Stiolto 2 puffs once daily. Keep albuterol available to use 2 puffs up to every 4 hours if needed for shortness of breath, chest tightness, wheezing.  Keep your flu shot, COVID-vaccine up-to-date next fall Follow with APP in 6 months Follow Dr. Delton Coombes in 12 months or sooner if you have any problems.  GERD (gastroesophageal reflux disease) Continue maintenance therapy  Tobacco use She stopped in October 2023 after an exacerbation and hospitalization.  Congratulated her on this and supported her cessation.  Would be reasonable to discuss lung cancer screening program with her next visit.  Allergic rhinitis She has not been on therapy but it does seem that rhinitis is the precipitant of this exacerbation.  Will try starting loratadine.  She wants to try to avoid nasal steroid if possible.  Levy Pupa, MD, PhD 01/19/2023, 10:14 AM [facility] [phone] or if no answer before 7:00PM call [phone] For any issues after 7:00PM please call eLink [phone]

## 2023-01-19 NOTE — Assessment & Plan Note (Signed)
Continue maintenance therapy

## 2023-01-19 NOTE — Patient Instructions (Signed)
Please take doxycycline 100 g twice a day until completely gone Please take prednisone as directed until completely gone We will continue Stiolto 2 puffs once daily. Keep albuterol available to use 2 puffs up to every 4 hours if needed for shortness of breath, chest tightness, wheezing.  Try starting loratadine 10 mg (generic Claritin) once daily.  Take this in the spring and fall months.  You could also consider taking it all year to help with allergies and congestion Congratulations on stopping smoking!! Keep your flu shot, COVID-vaccine up-to-date next fall Follow with APP in 6 months Follow Dr. Delton Coombes in 12 months or sooner if you have any problems.

## 2023-01-19 NOTE — Assessment & Plan Note (Signed)
She has not been on therapy but it does seem that rhinitis is the precipitant of this exacerbation.  Will try starting loratadine.  She wants to try to avoid nasal steroid if possible.

## 2023-01-19 NOTE — Addendum Note (Signed)
Addended by: Dorisann Frames R on: 01/19/2023 10:16 AM   Modules accepted: Orders

## 2023-01-19 NOTE — Assessment & Plan Note (Signed)
COPD with an acute flare that sounds like it was precipitated by allergic rhinitis and the season.  No other viral symptoms or viral prodrome, sick contacts.  Will treat her with a course of doxycycline, prednisone.  Continue her Stiolto but if she shows evidence that she is flaring frequently, more than 2 times annually, then we need to add an ICS.  Please take doxycycline 100 g twice a day until completely gone Please take prednisone as directed until completely gone We will continue Stiolto 2 puffs once daily. Keep albuterol available to use 2 puffs up to every 4 hours if needed for shortness of breath, chest tightness, wheezing.  Keep your flu shot, COVID-vaccine up-to-date next fall Follow with APP in 6 months Follow Dr. Delton Coombes in 12 months or sooner if you have any problems.

## 2023-01-19 NOTE — Assessment & Plan Note (Addendum)
She stopped in October 2023 after an exacerbation and hospitalization.  Congratulated her on this and supported her cessation.  Would be reasonable to discuss lung cancer screening program with her next visit.

## 2023-02-07 ENCOUNTER — Other Ambulatory Visit: Payer: Self-pay | Admitting: Emergency Medicine

## 2023-04-03 ENCOUNTER — Encounter (INDEPENDENT_AMBULATORY_CARE_PROVIDER_SITE_OTHER): Payer: Medicare HMO | Admitting: Ophthalmology

## 2023-04-03 DIAGNOSIS — H35033 Hypertensive retinopathy, bilateral: Secondary | ICD-10-CM | POA: Diagnosis not present

## 2023-04-03 DIAGNOSIS — E113393 Type 2 diabetes mellitus with moderate nonproliferative diabetic retinopathy without macular edema, bilateral: Secondary | ICD-10-CM

## 2023-04-03 DIAGNOSIS — I10 Essential (primary) hypertension: Secondary | ICD-10-CM

## 2023-04-03 DIAGNOSIS — H43813 Vitreous degeneration, bilateral: Secondary | ICD-10-CM | POA: Diagnosis not present

## 2023-04-03 DIAGNOSIS — Z7984 Long term (current) use of oral hypoglycemic drugs: Secondary | ICD-10-CM

## 2023-04-03 DIAGNOSIS — Z794 Long term (current) use of insulin: Secondary | ICD-10-CM

## 2023-05-18 ENCOUNTER — Encounter (HOSPITAL_COMMUNITY): Payer: Self-pay | Admitting: Emergency Medicine

## 2023-05-18 ENCOUNTER — Inpatient Hospital Stay (HOSPITAL_COMMUNITY)
Admission: EM | Admit: 2023-05-18 | Discharge: 2023-05-22 | DRG: 418 | Disposition: A | Discharge status: Home / Self Care | Payer: Medicare HMO | Attending: Internal Medicine | Admitting: Internal Medicine

## 2023-05-18 ENCOUNTER — Emergency Department (HOSPITAL_COMMUNITY): Payer: Medicare HMO

## 2023-05-18 ENCOUNTER — Other Ambulatory Visit: Payer: Self-pay

## 2023-05-18 DIAGNOSIS — K8070 Calculus of gallbladder and bile duct without cholecystitis without obstruction: Principal | ICD-10-CM | POA: Diagnosis present

## 2023-05-18 DIAGNOSIS — Z79899 Other long term (current) drug therapy: Secondary | ICD-10-CM

## 2023-05-18 DIAGNOSIS — Z833 Family history of diabetes mellitus: Secondary | ICD-10-CM

## 2023-05-18 DIAGNOSIS — Z7985 Long-term (current) use of injectable non-insulin antidiabetic drugs: Secondary | ICD-10-CM

## 2023-05-18 DIAGNOSIS — Z8249 Family history of ischemic heart disease and other diseases of the circulatory system: Secondary | ICD-10-CM

## 2023-05-18 DIAGNOSIS — R1011 Right upper quadrant pain: Secondary | ICD-10-CM | POA: Diagnosis not present

## 2023-05-18 DIAGNOSIS — Z6830 Body mass index (BMI) 30.0-30.9, adult: Secondary | ICD-10-CM

## 2023-05-18 DIAGNOSIS — K5900 Constipation, unspecified: Secondary | ICD-10-CM | POA: Diagnosis present

## 2023-05-18 DIAGNOSIS — E119 Type 2 diabetes mellitus without complications: Secondary | ICD-10-CM | POA: Diagnosis present

## 2023-05-18 DIAGNOSIS — K861 Other chronic pancreatitis: Secondary | ICD-10-CM | POA: Diagnosis present

## 2023-05-18 DIAGNOSIS — Z1152 Encounter for screening for COVID-19: Secondary | ICD-10-CM

## 2023-05-18 DIAGNOSIS — E860 Dehydration: Secondary | ICD-10-CM | POA: Diagnosis present

## 2023-05-18 DIAGNOSIS — I10 Essential (primary) hypertension: Secondary | ICD-10-CM | POA: Diagnosis present

## 2023-05-18 DIAGNOSIS — J9611 Chronic respiratory failure with hypoxia: Secondary | ICD-10-CM | POA: Diagnosis present

## 2023-05-18 DIAGNOSIS — Z794 Long term (current) use of insulin: Secondary | ICD-10-CM

## 2023-05-18 DIAGNOSIS — Z87891 Personal history of nicotine dependence: Secondary | ICD-10-CM

## 2023-05-18 DIAGNOSIS — J4489 Other specified chronic obstructive pulmonary disease: Secondary | ICD-10-CM | POA: Diagnosis present

## 2023-05-18 DIAGNOSIS — Z7984 Long term (current) use of oral hypoglycemic drugs: Secondary | ICD-10-CM

## 2023-05-18 DIAGNOSIS — K8020 Calculus of gallbladder without cholecystitis without obstruction: Secondary | ICD-10-CM | POA: Diagnosis present

## 2023-05-18 DIAGNOSIS — F4310 Post-traumatic stress disorder, unspecified: Secondary | ICD-10-CM | POA: Diagnosis present

## 2023-05-18 DIAGNOSIS — T502X5A Adverse effect of carbonic-anhydrase inhibitors, benzothiadiazides and other diuretics, initial encounter: Secondary | ICD-10-CM | POA: Diagnosis present

## 2023-05-18 DIAGNOSIS — E871 Hypo-osmolality and hyponatremia: Secondary | ICD-10-CM | POA: Diagnosis present

## 2023-05-18 DIAGNOSIS — E785 Hyperlipidemia, unspecified: Secondary | ICD-10-CM | POA: Diagnosis present

## 2023-05-18 DIAGNOSIS — D72829 Elevated white blood cell count, unspecified: Secondary | ICD-10-CM | POA: Diagnosis present

## 2023-05-18 DIAGNOSIS — K219 Gastro-esophageal reflux disease without esophagitis: Secondary | ICD-10-CM | POA: Diagnosis present

## 2023-05-18 DIAGNOSIS — E876 Hypokalemia: Secondary | ICD-10-CM | POA: Diagnosis present

## 2023-05-18 DIAGNOSIS — N179 Acute kidney failure, unspecified: Secondary | ICD-10-CM | POA: Diagnosis present

## 2023-05-18 DIAGNOSIS — E861 Hypovolemia: Secondary | ICD-10-CM | POA: Diagnosis present

## 2023-05-18 DIAGNOSIS — Z981 Arthrodesis status: Secondary | ICD-10-CM

## 2023-05-18 HISTORY — DX: Acute pancreatitis without necrosis or infection, unspecified: K85.90

## 2023-05-18 LAB — CBC
HCT: 42 % (ref 36.0–46.0)
Hemoglobin: 13.7 g/dL (ref 12.0–15.0)
MCH: 29.6 pg (ref 26.0–34.0)
MCHC: 32.6 g/dL (ref 30.0–36.0)
MCV: 90.7 fL (ref 80.0–100.0)
Platelets: 300 10*3/uL (ref 150–400)
RBC: 4.63 MIL/uL (ref 3.87–5.11)
RDW: 12.9 % (ref 11.5–15.5)
WBC: 15 10*3/uL — ABNORMAL HIGH (ref 4.0–10.5)
nRBC: 0 % (ref 0.0–0.2)

## 2023-05-18 LAB — COMPREHENSIVE METABOLIC PANEL
ALT: 30 U/L (ref 0–44)
AST: 26 U/L (ref 15–41)
Albumin: 4.1 g/dL (ref 3.5–5.0)
Alkaline Phosphatase: 69 U/L (ref 38–126)
Anion gap: 13 (ref 5–15)
BUN: 35 mg/dL — ABNORMAL HIGH (ref 6–20)
CO2: 22 mmol/L (ref 22–32)
Calcium: 9.3 mg/dL (ref 8.9–10.3)
Chloride: 87 mmol/L — ABNORMAL LOW (ref 98–111)
Creatinine, Ser: 3.59 mg/dL — ABNORMAL HIGH (ref 0.44–1.00)
GFR, Estimated: 14 mL/min — ABNORMAL LOW (ref >60)
Glucose, Bld: 118 mg/dL — ABNORMAL HIGH (ref 70–99)
Potassium: 3.3 mmol/L — ABNORMAL LOW (ref 3.5–5.1)
Sodium: 122 mmol/L — ABNORMAL LOW (ref 135–145)
Total Bilirubin: 0.8 mg/dL (ref 0.3–1.2)
Total Protein: 8.1 g/dL (ref 6.5–8.1)

## 2023-05-18 LAB — URINALYSIS, ROUTINE W REFLEX MICROSCOPIC
Bilirubin Urine: NEGATIVE
Glucose, UA: >=500 mg/dL — AB
Ketones, ur: NEGATIVE mg/dL
Leukocytes,Ua: NEGATIVE
Nitrite: NEGATIVE
Protein, ur: 100 mg/dL — AB
Specific Gravity, Urine: 1.007 (ref 1.005–1.030)
pH: 5 (ref 5.0–8.0)

## 2023-05-18 LAB — LIPASE, BLOOD: Lipase: 91 U/L — ABNORMAL HIGH (ref 11–51)

## 2023-05-18 LAB — CBG MONITORING, ED: Glucose-Capillary: 107 mg/dL — ABNORMAL HIGH (ref 70–99)

## 2023-05-18 MED ORDER — CIPROFLOXACIN IN D5W 400 MG/200ML IV SOLN: 400.0000 mg | Freq: Once | INTRAVENOUS | Status: DC

## 2023-05-18 MED ORDER — FENTANYL CITRATE PF 50 MCG/ML IJ SOSY: 50.0000 ug | PREFILLED_SYRINGE | Freq: Once | INTRAMUSCULAR | Status: DC

## 2023-05-18 MED ORDER — ONDANSETRON HCL 4 MG/2ML IJ SOLN: 4.0000 mg | Freq: Once | INTRAMUSCULAR | Status: DC

## 2023-05-18 MED ORDER — FENTANYL CITRATE PF 50 MCG/ML IJ SOSY
50.0000 ug | PREFILLED_SYRINGE | Freq: Once | INTRAMUSCULAR | Status: AC
  Administered 2023-05-19: 50 ug via INTRAVENOUS
  Filled 2023-05-18: qty 1

## 2023-05-18 MED ORDER — ONDANSETRON 4 MG PO TBDP
4.0000 mg | ORAL_TABLET | Freq: Once | ORAL | Status: AC
  Administered 2023-05-18: 4 mg via ORAL
  Filled 2023-05-18: qty 1

## 2023-05-18 MED ORDER — METRONIDAZOLE 500 MG/100ML IV SOLN: 500.0000 mg | Freq: Once | INTRAVENOUS | Status: DC

## 2023-05-18 MED ORDER — SODIUM CHLORIDE 0.9 % IV BOLUS
1000.0000 mL | Freq: Once | INTRAVENOUS | Status: AC
  Administered 2023-05-19: 1000 mL via INTRAVENOUS

## 2023-05-18 NOTE — ED Notes (Addendum)
Pt notified staff of wanting to go home. Pt consulted and risks discussed.   Encouraged to stay longer and speak with providers regarding lab work. New armband placed on her arm.

## 2023-05-18 NOTE — ED Triage Notes (Signed)
Patient presents due to upper abdominal pain she has had for 2 weeks she believes could be related to pancreatitis. She also reports being constipated for 1 week. Nausea began today before she arrived to the ED.    EMS vitals:  114/56 BP 70 HR 18 RR 93 % SPO2 on room air    (O2 as needed at home) 98.4 Temp 187 CBG

## 2023-05-18 NOTE — ED Notes (Signed)
IV attempted x2 without success.

## 2023-05-18 NOTE — ED Provider Notes (Signed)
[facility] AT [facility] Provider Note   CSN: [id number] Arrival date & time: 05/18/23  1634     History {Add pertinent medical, surgical, social history, OB history to HPI:1} No chief complaint on file.   [name] is a 57 y.o. female, history of COPD, who presents to the ED secondary to right upper quadrant abdominal pain, it has been going on for the last 2 weeks, that comes and goes.  She states that now it is severe, and she came to the ER because she cannot tolerate anymore.  She states she has associated nausea, and denies any fevers or chills.  Notes that she has not been able to eat or drink secondary to the pain.  History of appendectomy, no history of C-sections.  Home Medications Prior to Admission medications   Medication Sig Start Date End Date Taking? Authorizing Provider  albuterol (PROVENTIL) (2.5 MG/3ML) 0.083% nebulizer solution Inhale 1 vial via nebulizer every 6 (six) hours as needed for shortness of breath. 07/31/22  Yes Danford, Earl Lites, MD  albuterol (VENTOLIN HFA) 108 (90 Base) MCG/ACT inhaler INHALE 1 TO 2 PUFFS INTO THE LUNGS EVERY 6 HOURS AS NEEDED FOR WHEEZING OR SHORTNESS OF BREATH Patient taking differently: Inhale 1-2 puffs into the lungs every 6 (six) hours as needed for wheezing or shortness of breath. 01/20/22  Yes Leslye Peer, MD  ALPRAZolam Prudy Feeler) 0.5 MG tablet Take 0.5 mg by mouth at bedtime. 07/04/22  Yes [provider]  baclofen (LIORESAL) 10 MG tablet Take 10 mg by mouth 3 (three) times daily as needed for muscle spasms.   Yes [provider]  JARDIANCE 25 MG TABS tablet Take 25 mg by mouth daily. 04/10/23  Yes [provider]  LANTUS SOLOSTAR 100 UNIT/ML Solostar Pen Inject 20 Units into the skin at bedtime. Patient taking differently: Inject 65 Units into the skin at bedtime. 10/26/21  Yes Zannie Cove, MD  MOUNJARO 2.5 MG/0.5ML Pen Inject 2.5 mg into the skin once a week.  tuesday 04/23/23  Yes [provider]  naloxone (NARCAN) nasal spray 4 mg/0.1 mL Place 0.4 mg into the nose as needed (as directed for opioid emergency). 02/23/22  Yes [provider]  olmesartan-hydrochlorothiazide (BENICAR HCT) 40-25 MG tablet Take 1 tablet by mouth daily. 08/01/22  Yes Leslye Peer, MD  oxyCODONE-acetaminophen (PERCOCET) 10-325 MG tablet Take 1 tablet by mouth 5 (five) times daily. 10/28/20  Yes [provider]  pantoprazole (PROTONIX) 40 MG tablet Take 40 mg by mouth daily before breakfast.   Yes [provider]  rosuvastatin (CRESTOR) 40 MG tablet Take 40 mg by mouth every evening. 09/28/20  Yes [provider]  sertraline (ZOLOFT) 100 MG tablet Take 100 mg by mouth in the morning.   Yes [provider]  STIOLTO RESPIMAT 2.5-2.5 MCG/ACT AERS INHALE 2 PUFFS INTO THE LUNGS DAILY 02/08/23  Yes Byrum, Les Pou, MD  ACCU-CHEK AVIVA PLUS test strip USE AS DIRECTED FOR 30 DAYS 04/07/18   [provider]  JANUMET 50-1000 MG tablet Take 1 tablet by mouth 2 (two) times daily. Patient not taking: Reported on 05/18/2023 10/03/20   [provider]  lisinopril-hydrochlorothiazide (ZESTORETIC) 20-25 MG tablet Take 1 tablet by mouth daily. Patient not taking: Reported on 05/18/2023 12/29/22   [provider]  Nicotine (NICODERM CQ TD) Place 1 patch onto the skin daily as needed (for smoking cessation while hospitalized). Patient not taking: Reported on 01/19/2023    [provider]  RELION PEN NEEDLE 31G/8MM 31G X 8 MM MISC Inject 1 each into the skin daily. use as directed 10/01/18   Lezlie Lye, Meda Coffee, MD      Allergies    Patient has no known allergies.    Review of Systems   Review of Systems  Gastrointestinal:  Positive for abdominal pain and nausea. Negative for vomiting.    Physical Exam Updated Vital Signs BP (!) 103/54 (BP Location: Left Arm)   Pulse 71   Temp 98.3 F (36.8 C) (Oral)    Resp 18   LMP 03/07/2012   SpO2 92%  Physical Exam Vitals and nursing note reviewed.  Constitutional:      General: She is not in acute distress.    Appearance: She is well-developed.  HENT:     Head: Normocephalic and atraumatic.     Mouth/Throat:     Mouth: Mucous membranes are dry.  Eyes:     Conjunctiva/sclera: Conjunctivae normal.  Cardiovascular:     Rate and Rhythm: Normal rate and regular rhythm.     Heart sounds: No murmur heard. Pulmonary:     Effort: Pulmonary effort is normal. No respiratory distress.     Breath sounds: Normal breath sounds.  Abdominal:     Palpations: Abdomen is soft.     Tenderness: There is abdominal tenderness in the right upper quadrant. There is guarding. Positive signs include Murphy's sign.  Musculoskeletal:        General: No swelling.     Cervical back: Neck supple.  Skin:    General: Skin is warm and dry.     Capillary Refill: Capillary refill takes less than 2 seconds.  Neurological:     Mental Status: She is alert.  Psychiatric:        Mood and Affect: Mood normal.     ED Results / Procedures / Treatments   Labs (all labs ordered are listed, but only abnormal results are displayed) Labs Reviewed  LIPASE, BLOOD - Abnormal; Notable for the following components:      Result Value   Lipase 91 (*)    All other components within normal limits  COMPREHENSIVE METABOLIC PANEL - Abnormal; Notable for the following components:   Sodium 122 (*)    Potassium 3.3 (*)    Chloride 87 (*)    Glucose, Bld 118 (*)    BUN 35 (*)    Creatinine, Ser 3.59 (*)    GFR, Estimated 14 (*)    All other components within normal limits  CBC - Abnormal; Notable for the following components:   WBC 15.0 (*)    All other components within normal limits  URINALYSIS, ROUTINE W REFLEX MICROSCOPIC - Abnormal; Notable for the following components:   APPearance HAZY (*)    Glucose, UA >=500 (*)    Hgb urine dipstick MODERATE (*)    Protein, ur 100 (*)     Bacteria, UA RARE (*)    All other components within normal limits  CBG MONITORING, ED - Abnormal; Notable for the following components:   Glucose-Capillary 107 (*)    All other components within normal limits    EKG EKG Interpretation Date/Time:  Friday May 18 2023 17:23:04 EDT Ventricular Rate:  75 PR Interval:  166 QRS Duration:  106 QT Interval:  505 QTC Calculation: 565 R Axis:   24  Text Interpretation: Sinus rhythm Low voltage, precordial leads Prolonged QT interval Confirmed by Vivi Barrack [phone]) on 05/18/2023 10:50:36 PM  Radiology US Abdomen Limited RUQ (LIVER/GB)  Result Date: 05/18/2023 CLINICAL DATA:  Cholelithiasis EXAM: ULTRASOUND ABDOMEN LIMITED RIGHT UPPER QUADRANT COMPARISON:  10/29/2020 FINDINGS: Gallbladder: Gallbladder is well distended with evidence of cholelithiasis. No wall thickening or pericholecystic fluid is noted. Negative sonographic Murphy's sign is elicited. Common bile duct: Diameter: 3.1 mm. Liver: Generally increased in echogenicity consistent with fatty infiltration. Portal vein is patent on color Doppler imaging with normal direction of blood flow towards the liver. Other: None. IMPRESSION: Cholelithiasis without complicating factors. Electronically Signed   By: Alcide Clever M.D.   On: 05/18/2023 23:08    Procedures Procedures  {Document cardiac monitor, telemetry assessment procedure when appropriate:1}  Medications Ordered in ED Medications  sodium chloride 0.9 % bolus 1,000 mL (has no administration in time range)  ondansetron (ZOFRAN-ODT) disintegrating tablet 4 mg (has no administration in time range)  fentaNYL (SUBLIMAZE) injection 50 mcg (has no administration in time range)    ED Course/ Medical Decision Making/ A&P   {   Click here for ABCD2, HEART and other calculatorsREFRESH Note before signing :1}                              Medical Decision Making Patient is a 57 year old female, here for right upper quadrant pain, it has  been going on for the last 2 weeks, that comes and goes, worse with food.  Has stopped eating because she is afraid of the pain.  We will obtain labs, right upper quadrant ultrasound for further evaluation.  Amount and/or Complexity of Data Reviewed Labs: ordered.    Details: No transaminitis, leukocytosis of 15,000, sodium of 122, lipase mildly elevated at 91. Radiology: ordered.    Details: Right upper quadrant ultrasound shows cholelithiasis Discussion of management or test interpretation with external provider(s): Given patient's history of right upper quadrant pain, that is worse with food, and does have tenderness to palpation on exam, and lab findings of cholelithiasis, without any findings of cholecystitis, with AKI, she will require admission, for likely removal of her gallbladder, and rehydration.  She has a marked AKI, with creatinine typically 1, and now it is 3.59.  Will require admission, for symptomatic cholelithiasis, resulting in AKI, and hyponatremia.  Surprisingly well-appearing.  Just dry mucosal membranes.  Risk Prescription drug management. Decision regarding hospitalization.    Final Clinical Impression(s) / ED Diagnoses Final diagnoses:  Symptomatic cholelithiasis  AKI (acute kidney injury) (HCC)  Hyponatremia    Rx / DC Orders ED Discharge Orders     None

## 2023-05-18 NOTE — ED Notes (Signed)
Pt given labeled specimen cup for U/A collection per MD order. [employer]

## 2023-05-19 ENCOUNTER — Encounter (HOSPITAL_COMMUNITY): Payer: Self-pay | Admitting: Internal Medicine

## 2023-05-19 DIAGNOSIS — N179 Acute kidney failure, unspecified: Secondary | ICD-10-CM | POA: Diagnosis present

## 2023-05-19 DIAGNOSIS — K8020 Calculus of gallbladder without cholecystitis without obstruction: Secondary | ICD-10-CM | POA: Diagnosis present

## 2023-05-19 DIAGNOSIS — E119 Type 2 diabetes mellitus without complications: Secondary | ICD-10-CM | POA: Diagnosis present

## 2023-05-19 DIAGNOSIS — Z8249 Family history of ischemic heart disease and other diseases of the circulatory system: Secondary | ICD-10-CM | POA: Diagnosis not present

## 2023-05-19 DIAGNOSIS — Z7984 Long term (current) use of oral hypoglycemic drugs: Secondary | ICD-10-CM | POA: Diagnosis not present

## 2023-05-19 DIAGNOSIS — Z833 Family history of diabetes mellitus: Secondary | ICD-10-CM | POA: Diagnosis not present

## 2023-05-19 DIAGNOSIS — J4489 Other specified chronic obstructive pulmonary disease: Secondary | ICD-10-CM | POA: Diagnosis present

## 2023-05-19 DIAGNOSIS — Z794 Long term (current) use of insulin: Secondary | ICD-10-CM | POA: Diagnosis not present

## 2023-05-19 DIAGNOSIS — I10 Essential (primary) hypertension: Secondary | ICD-10-CM | POA: Diagnosis present

## 2023-05-19 DIAGNOSIS — K8070 Calculus of gallbladder and bile duct without cholecystitis without obstruction: Secondary | ICD-10-CM | POA: Diagnosis present

## 2023-05-19 DIAGNOSIS — E871 Hypo-osmolality and hyponatremia: Secondary | ICD-10-CM | POA: Diagnosis present

## 2023-05-19 DIAGNOSIS — K219 Gastro-esophageal reflux disease without esophagitis: Secondary | ICD-10-CM | POA: Diagnosis present

## 2023-05-19 DIAGNOSIS — R1011 Right upper quadrant pain: Secondary | ICD-10-CM | POA: Diagnosis present

## 2023-05-19 DIAGNOSIS — T502X5A Adverse effect of carbonic-anhydrase inhibitors, benzothiadiazides and other diuretics, initial encounter: Secondary | ICD-10-CM | POA: Diagnosis present

## 2023-05-19 DIAGNOSIS — J9611 Chronic respiratory failure with hypoxia: Secondary | ICD-10-CM | POA: Diagnosis present

## 2023-05-19 DIAGNOSIS — F4310 Post-traumatic stress disorder, unspecified: Secondary | ICD-10-CM | POA: Diagnosis present

## 2023-05-19 DIAGNOSIS — Z87891 Personal history of nicotine dependence: Secondary | ICD-10-CM | POA: Diagnosis not present

## 2023-05-19 DIAGNOSIS — Z7985 Long-term (current) use of injectable non-insulin antidiabetic drugs: Secondary | ICD-10-CM | POA: Diagnosis not present

## 2023-05-19 DIAGNOSIS — Z1152 Encounter for screening for COVID-19: Secondary | ICD-10-CM | POA: Diagnosis not present

## 2023-05-19 DIAGNOSIS — E785 Hyperlipidemia, unspecified: Secondary | ICD-10-CM | POA: Diagnosis present

## 2023-05-19 DIAGNOSIS — I5032 Chronic diastolic (congestive) heart failure: Secondary | ICD-10-CM | POA: Diagnosis not present

## 2023-05-19 DIAGNOSIS — Z981 Arthrodesis status: Secondary | ICD-10-CM | POA: Diagnosis not present

## 2023-05-19 DIAGNOSIS — E860 Dehydration: Secondary | ICD-10-CM | POA: Diagnosis present

## 2023-05-19 DIAGNOSIS — K861 Other chronic pancreatitis: Secondary | ICD-10-CM | POA: Diagnosis present

## 2023-05-19 DIAGNOSIS — E876 Hypokalemia: Secondary | ICD-10-CM | POA: Diagnosis present

## 2023-05-19 DIAGNOSIS — I110 Hypertensive heart disease with heart failure: Secondary | ICD-10-CM | POA: Diagnosis not present

## 2023-05-19 DIAGNOSIS — E861 Hypovolemia: Secondary | ICD-10-CM | POA: Diagnosis present

## 2023-05-19 LAB — GLUCOSE, CAPILLARY
Glucose-Capillary: 212 mg/dL — ABNORMAL HIGH (ref 70–99)
Glucose-Capillary: 95 mg/dL (ref 70–99)

## 2023-05-19 LAB — COMPREHENSIVE METABOLIC PANEL
ALT: 25 U/L (ref 0–44)
AST: 23 U/L (ref 15–41)
Albumin: 3.6 g/dL (ref 3.5–5.0)
Alkaline Phosphatase: 62 U/L (ref 38–126)
Anion gap: 10 (ref 5–15)
BUN: 34 mg/dL — ABNORMAL HIGH (ref 6–20)
CO2: 21 mmol/L — ABNORMAL LOW (ref 22–32)
Calcium: 8.9 mg/dL (ref 8.9–10.3)
Chloride: 99 mmol/L (ref 98–111)
Creatinine, Ser: 2.96 mg/dL — ABNORMAL HIGH (ref 0.44–1.00)
GFR, Estimated: 18 mL/min — ABNORMAL LOW (ref >60)
Glucose, Bld: 100 mg/dL — ABNORMAL HIGH (ref 70–99)
Potassium: 3 mmol/L — ABNORMAL LOW (ref 3.5–5.1)
Sodium: 130 mmol/L — ABNORMAL LOW (ref 135–145)
Total Bilirubin: 0.5 mg/dL (ref 0.3–1.2)
Total Protein: 7.1 g/dL (ref 6.5–8.1)

## 2023-05-19 LAB — CBC
HCT: 41.5 % (ref 36.0–46.0)
Hemoglobin: 13.4 g/dL (ref 12.0–15.0)
MCH: 29.8 pg (ref 26.0–34.0)
MCHC: 32.3 g/dL (ref 30.0–36.0)
MCV: 92.2 fL (ref 80.0–100.0)
Platelets: 259 10*3/uL (ref 150–400)
RBC: 4.5 MIL/uL (ref 3.87–5.11)
RDW: 12.7 % (ref 11.5–15.5)
WBC: 11.9 10*3/uL — ABNORMAL HIGH (ref 4.0–10.5)
nRBC: 0 % (ref 0.0–0.2)

## 2023-05-19 LAB — HIV ANTIBODY (ROUTINE TESTING W REFLEX): HIV Screen 4th Generation wRfx: NONREACTIVE

## 2023-05-19 LAB — CBG MONITORING, ED: Glucose-Capillary: 78 mg/dL (ref 70–99)

## 2023-05-19 LAB — PHOSPHORUS: Phosphorus: 4.5 mg/dL (ref 2.5–4.6)

## 2023-05-19 LAB — MAGNESIUM: Magnesium: 2.3 mg/dL (ref 1.7–2.4)

## 2023-05-19 MED ORDER — SERTRALINE HCL 100 MG PO TABS
100.0000 mg | ORAL_TABLET | Freq: Every day | ORAL | Status: DC
  Administered 2023-05-19 – 2023-05-21 (×3): 100 mg via ORAL
  Filled 2023-05-19: qty 2
  Filled 2023-05-19 (×2): qty 1

## 2023-05-19 MED ORDER — PANTOPRAZOLE SODIUM 40 MG IV SOLR
40.0000 mg | INTRAVENOUS | Status: DC
  Administered 2023-05-19 – 2023-05-21 (×3): 40 mg via INTRAVENOUS
  Filled 2023-05-19 (×3): qty 10

## 2023-05-19 MED ORDER — ALBUTEROL SULFATE (2.5 MG/3ML) 0.083% IN NEBU: 2.5000 mg | INHALATION_SOLUTION | Freq: Four times a day (QID) | RESPIRATORY_TRACT | Status: DC | PRN

## 2023-05-19 MED ORDER — INSULIN ASPART 100 UNIT/ML IJ SOLN
0.0000 [IU] | Freq: Three times a day (TID) | INTRAMUSCULAR | Status: DC
  Administered 2023-05-19: 3 [IU] via SUBCUTANEOUS
  Administered 2023-05-20 (×2): 2 [IU] via SUBCUTANEOUS
  Filled 2023-05-19: qty 1
  Filled 2023-05-19: qty 0.09

## 2023-05-19 MED ORDER — POTASSIUM CHLORIDE CRYS ER 20 MEQ PO TBCR
40.0000 meq | EXTENDED_RELEASE_TABLET | Freq: Once | ORAL | Status: AC
  Administered 2023-05-19: 40 meq via ORAL
  Filled 2023-05-19: qty 2

## 2023-05-19 MED ORDER — ONDANSETRON HCL 4 MG/2ML IJ SOLN
4.0000 mg | Freq: Four times a day (QID) | INTRAMUSCULAR | Status: DC | PRN
  Administered 2023-05-20: 4 mg via INTRAVENOUS
  Filled 2023-05-19: qty 2

## 2023-05-19 MED ORDER — SODIUM CHLORIDE 0.9 % IV BOLUS
1000.0000 mL | Freq: Once | INTRAVENOUS | Status: AC
  Administered 2023-05-19: 1000 mL via INTRAVENOUS

## 2023-05-19 MED ORDER — SODIUM CHLORIDE 0.9 % IV SOLN: INTRAVENOUS | Status: DC

## 2023-05-19 MED ORDER — UMECLIDINIUM BROMIDE 62.5 MCG/ACT IN AEPB
1.0000 | INHALATION_SPRAY | Freq: Every day | RESPIRATORY_TRACT | Status: DC
  Administered 2023-05-20 – 2023-05-21 (×2): 1 via RESPIRATORY_TRACT
  Filled 2023-05-19 (×2): qty 7

## 2023-05-19 MED ORDER — MUSCLE RUB 10-15 % EX CREA
TOPICAL_CREAM | CUTANEOUS | Status: DC | PRN
  Filled 2023-05-19: qty 85

## 2023-05-19 MED ORDER — FENTANYL CITRATE PF 50 MCG/ML IJ SOSY
50.0000 ug | PREFILLED_SYRINGE | Freq: Once | INTRAMUSCULAR | Status: AC
  Administered 2023-05-19: 50 ug via INTRAVENOUS
  Filled 2023-05-19: qty 1

## 2023-05-19 MED ORDER — STERILE WATER FOR INJECTION IJ SOLN
INTRAMUSCULAR | Status: AC
  Filled 2023-05-19: qty 10

## 2023-05-19 MED ORDER — ARFORMOTEROL TARTRATE 15 MCG/2ML IN NEBU
15.0000 ug | INHALATION_SOLUTION | Freq: Two times a day (BID) | RESPIRATORY_TRACT | Status: DC
  Administered 2023-05-19 – 2023-05-21 (×4): 15 ug via RESPIRATORY_TRACT
  Filled 2023-05-19 (×4): qty 2

## 2023-05-19 MED ORDER — PNEUMOCOCCAL 20-VAL CONJ VACC 0.5 ML IM SUSY
0.5000 mL | PREFILLED_SYRINGE | INTRAMUSCULAR | Status: DC
  Filled 2023-05-19: qty 0.5

## 2023-05-19 MED ORDER — FENTANYL CITRATE PF 50 MCG/ML IJ SOSY
50.0000 ug | PREFILLED_SYRINGE | INTRAMUSCULAR | Status: DC | PRN
  Administered 2023-05-19 – 2023-05-21 (×10): 50 ug via INTRAVENOUS
  Filled 2023-05-19 (×12): qty 1

## 2023-05-19 MED ORDER — HEPARIN SODIUM (PORCINE) 5000 UNIT/ML IJ SOLN
5000.0000 [IU] | Freq: Three times a day (TID) | INTRAMUSCULAR | Status: DC
  Administered 2023-05-19 – 2023-05-20 (×5): 5000 [IU] via SUBCUTANEOUS
  Filled 2023-05-19 (×5): qty 1

## 2023-05-19 NOTE — ED Notes (Signed)
ED TO INPATIENT HANDOFF REPORT  ED Nurse Name and Phone #: Suzanna Obey [phone]  S Name/Age/Gender [name] 57 y.o. female Room/Bed: WA19/WA19  Code Status   Code Status: Full Code  Home/SNF/Other Home Patient oriented to: self, place, time, and situation Is this baseline? Yes   Triage Complete: Triage complete  Chief Complaint AKI (acute kidney injury) (HCC) [N17.9]  Triage Note Patient presents due to upper abdominal pain she has had for 2 weeks she believes could be related to pancreatitis. She also reports being constipated for 1 week. Nausea began today before she arrived to the ED.    EMS vitals:  114/56 BP 70 HR 18 RR 93 % SPO2 on room air    (O2 as needed at home) 98.4 Temp 187 CBG   Allergies No Known Allergies  Level of Care/Admitting Diagnosis ED Disposition     ED Disposition  Admit   Condition  --   Comment  Hospital Area: [facility] [[id number]]  Level of Care: Telemetry [5]  Admit to tele based on following criteria: Other see comments  Comments: Hypokalemia  May admit patient to [facility] or [facility] if equivalent level of care is available:: No  Covid Evaluation: Asymptomatic - no recent exposure (last 10 days) testing not required  Diagnosis: AKI (acute kidney injury) [facility]) [[id number]]  Admitting Physician: Bobette Mo [[id number]]  Attending Physician: Bobette Mo [[id number]]  Certification:: I certify this patient will need inpatient services for at least 2 midnights  Estimated Length of Stay: 2          B Medical/Surgery History Past Medical History:  Diagnosis Date   Asthma    COPD (chronic obstructive pulmonary disease) (HCC)    Diabetes mellitus without complication (HCC)    Essential hypertension, benign 05/14/2018   GERD (gastroesophageal reflux disease)    Heart murmur    from childhood rheumatic fever, per patient   Hyperlipidemia    Pancreatitis    Pneumonia 09/2013   PTSD  (post-traumatic stress disorder) 05/14/2018   Wears partial dentures    top and bottom partials   Past Surgical History:  Procedure Laterality Date   ANTERIOR LAT LUMBAR FUSION Left 07/31/2013   Procedure: ANTERIOR LATERAL LUMBAR FUSION 1 LEVEL/Left sided lumbar 3-4 lateral interbody fusion with instrumentation and allograft.;  Surgeon: Emilee Hero, MD;  Location: [facility] OR;  Service: Orthopedics;  Laterality: Left;  Left sided lumbar 3-4 lateral interbody fusion with instrumentation and allograft.   APPENDECTOMY  30- 40 years   carpal tunnel     right arm   CARPAL TUNNEL RELEASE Right 12/15/2013   Procedure: RIGHT CARPAL TUNNEL RELEASE ENDOSCOPIC;  Surgeon: Jodi Marble, MD;  Location: [facility];  Service: Orthopedics;  Laterality: Right;   LATERAL EPICONDYLE RELEASE Right 12/15/2013   Procedure: RIGHT ULNAR NEUROPLASTY AT ELBOW ;  Surgeon: Jodi Marble, MD;  Location: [facility];  Service: Orthopedics;  Laterality: Right;   LUMBAR FUSION       A IV Location/Drains/Wounds Patient Lines/Drains/Airways Status     Active Line/Drains/Airways     Name Placement date Placement time Site Days   Peripheral IV 05/19/23 20 G 1" Anterior;Left;Proximal Forearm 05/19/23  0005  Forearm  less than 1   Wound / Incision (Open or Dehisced) 10/15/21 Skin tear Face Left pink 10/15/21  2032  Face  581            Intake/Output Last 24 hours  Intake/Output Summary (Last 24 hours) at 05/19/2023 1441 Last data filed at 05/19/2023 1206 Gross per 24 hour  Intake 2998 ml  Output --  Net 2998 ml    Labs/Imaging Results for orders placed or performed during the hospital encounter of 05/18/23 (from the past 48 hour(s))  Lipase, blood     Status: Abnormal   Collection Time: 05/18/23  5:59 PM  Result Value Ref Range   Lipase 91 (H) 11 - 51 U/L    Comment: Performed at [facility], [address], [place], [place] [address]  Comprehensive  metabolic panel     Status: Abnormal   Collection Time: 05/18/23  5:59 PM  Result Value Ref Range   Sodium 122 (L) 135 - 145 mmol/L   Potassium 3.3 (L) 3.5 - 5.1 mmol/L   Chloride 87 (L) 98 - 111 mmol/L   CO2 22 22 - 32 mmol/L   Glucose, Bld 118 (H) 70 - 99 mg/dL    Comment: Glucose reference range applies only to samples taken after fasting for at least 8 hours.   BUN 35 (H) 6 - 20 mg/dL   Creatinine, Ser 7.82 (H) 0.44 - 1.00 mg/dL   Calcium 9.3 8.9 - 95.6 mg/dL   Total Protein 8.1 6.5 - 8.1 g/dL   Albumin 4.1 3.5 - 5.0 g/dL   AST 26 15 - 41 U/L   ALT 30 0 - 44 U/L   Alkaline Phosphatase 69 38 - 126 U/L   Total Bilirubin 0.8 0.3 - 1.2 mg/dL   GFR, Estimated 14 (L) >60 mL/min    Comment: (NOTE) Calculated using the CKD-EPI Creatinine Equation (2021)    Anion gap 13 5 - 15    Comment: Performed at [facility], [address], [place], [place] [address]  CBC     Status: Abnormal   Collection Time: 05/18/23  5:59 PM  Result Value Ref Range   WBC 15.0 (H) 4.0 - 10.5 K/uL   RBC 4.63 3.87 - 5.11 MIL/uL   Hemoglobin 13.7 12.0 - 15.0 g/dL   HCT 65.7 84.6 - 96.2 %   MCV 90.7 80.0 - 100.0 fL   MCH 29.6 26.0 - 34.0 pg   MCHC 32.6 30.0 - 36.0 g/dL   RDW 95.2 84.1 - 32.4 %   Platelets 300 150 - 400 K/uL   nRBC 0.0 0.0 - 0.2 %    Comment: Performed at [facility], [address], [place], [place] [address]  CBG monitoring, ED     Status: Abnormal   Collection Time: 05/18/23  6:48 PM  Result Value Ref Range   Glucose-Capillary 107 (H) 70 - 99 mg/dL    Comment: Glucose reference range applies only to samples taken after fasting for at least 8 hours.  Urinalysis, Routine w reflex microscopic -Urine, Clean Catch     Status: Abnormal   Collection Time: 05/18/23  9:37 PM  Result Value Ref Range   Color, Urine YELLOW YELLOW   APPearance HAZY (A) CLEAR   Specific Gravity, Urine 1.007 1.005 - 1.030   pH 5.0 5.0 - 8.0   Glucose, UA >=500 (A) NEGATIVE  mg/dL   Hgb urine dipstick MODERATE (A) NEGATIVE   Bilirubin Urine NEGATIVE NEGATIVE   Ketones, ur NEGATIVE NEGATIVE mg/dL   Protein, ur 725 (A) NEGATIVE mg/dL   Nitrite NEGATIVE NEGATIVE   Leukocytes,Ua NEGATIVE NEGATIVE   RBC / HPF 0-5 0 - 5 RBC/hpf   WBC, UA 0-5 0 -  5 WBC/hpf   Bacteria, UA RARE (A) NONE SEEN   Squamous Epithelial / HPF 0-5 0 - 5 /HPF   Mucus PRESENT    Hyaline Casts, UA PRESENT     Comment: Performed at [facility], [address], [place], [place] [address]  CBC     Status: Abnormal   Collection Time: 05/19/23  8:20 AM  Result Value Ref Range   WBC 11.9 (H) 4.0 - 10.5 K/uL   RBC 4.50 3.87 - 5.11 MIL/uL   Hemoglobin 13.4 12.0 - 15.0 g/dL   HCT 60.4 54.0 - 98.1 %   MCV 92.2 80.0 - 100.0 fL   MCH 29.8 26.0 - 34.0 pg   MCHC 32.3 30.0 - 36.0 g/dL   RDW 19.1 47.8 - 29.5 %   Platelets 259 150 - 400 K/uL   nRBC 0.0 0.0 - 0.2 %    Comment: Performed at [facility], [address] [place], [place] [address]  Comprehensive metabolic panel     Status: Abnormal   Collection Time: 05/19/23  8:20 AM  Result Value Ref Range   Sodium 130 (L) 135 - 145 mmol/L    Comment: DELTA CHECK NOTED   Potassium 3.0 (L) 3.5 - 5.1 mmol/L   Chloride 99 98 - 111 mmol/L   CO2 21 (L) 22 - 32 mmol/L   Glucose, Bld 100 (H) 70 - 99 mg/dL    Comment: Glucose reference range applies only to samples taken after fasting for at least 8 hours.   BUN 34 (H) 6 - 20 mg/dL   Creatinine, Ser 8.65 (H) 0.44 - 1.00 mg/dL   Calcium 8.9 8.9 - 78.4 mg/dL   Total Protein 7.1 6.5 - 8.1 g/dL   Albumin 3.6 3.5 - 5.0 g/dL   AST 23 15 - 41 U/L   ALT 25 0 - 44 U/L   Alkaline Phosphatase 62 38 - 126 U/L   Total Bilirubin 0.5 0.3 - 1.2 mg/dL   GFR, Estimated 18 (L) >60 mL/min    Comment: (NOTE) Calculated using the CKD-EPI Creatinine Equation (2021)    Anion gap 10 5 - 15    Comment: Performed at [facility], [address] [place], [place]  [address]  Magnesium     Status: None   Collection Time: 05/19/23  8:20 AM  Result Value Ref Range   Magnesium 2.3 1.7 - 2.4 mg/dL    Comment: Performed at [facility], [address], [place], [place] [address]  Phosphorus     Status: None   Collection Time: 05/19/23  8:20 AM  Result Value Ref Range   Phosphorus 4.5 2.5 - 4.6 mg/dL    Comment: Performed at [facility], [address], [place], [place] [address]  CBG monitoring, ED     Status: None   Collection Time: 05/19/23 11:56 AM  Result Value Ref Range   Glucose-Capillary 78 70 - 99 mg/dL    Comment: Glucose reference range applies only to samples taken after fasting for at least 8 hours.   US Abdomen Limited RUQ (LIVER/GB)  Result Date: 05/18/2023 CLINICAL DATA:  Cholelithiasis EXAM: ULTRASOUND ABDOMEN LIMITED RIGHT UPPER QUADRANT COMPARISON:  10/29/2020 FINDINGS: Gallbladder: Gallbladder is well distended with evidence of cholelithiasis. No wall thickening or pericholecystic fluid is noted. Negative sonographic Murphy's sign is elicited. Common bile duct: Diameter: 3.1 mm. Liver: Generally increased in echogenicity consistent with fatty infiltration. Portal vein is patent on color Doppler  imaging with normal direction of blood flow towards the liver. Other: None. IMPRESSION: Cholelithiasis without complicating factors. Electronically Signed   By: Alcide Clever M.D.   On: 05/18/2023 23:08    Pending Labs Unresulted Labs (From admission, onward)     Start     Ordered   05/20/23 0500  CBC  Tomorrow morning,   R        05/19/23 0856   05/20/23 0500  Comprehensive metabolic panel  Tomorrow morning,   R        05/19/23 0856   05/19/23 0902  Hemoglobin A1c  Add-on,   AD       Comments: To assess prior glycemic control    05/19/23 0901   05/19/23 0813  HIV Antibody (routine testing w rflx)  (HIV Antibody (Routine testing w reflex) panel)  Once,   R        05/19/23 0856             Vitals/Pain Today's Vitals   05/19/23 1128 05/19/23 1130 05/19/23 1308 05/19/23 1319  BP: (!) 119/59 (!) 111/51 109/61   Pulse: 70 68    Resp: 18 18    Temp:    98.2 F (36.8 C)  TempSrc:      SpO2: 95% 95%    PainSc:        Isolation Precautions No active isolations  Medications Medications  0.9 %  sodium chloride infusion (0 mLs Intravenous Stopped 05/19/23 1206)  heparin injection 5,000 Units (5,000 Units Subcutaneous Given 05/19/23 0930)  insulin aspart (novoLOG) injection 0-9 Units (0 Units Subcutaneous Not Given 05/19/23 1209)  pantoprazole (PROTONIX) injection 40 mg (40 mg Intravenous Given 05/19/23 1006)  ondansetron (ZOFRAN) injection 4 mg (has no administration in time range)  fentaNYL (SUBLIMAZE) injection 50 mcg (50 mcg Intravenous Given 05/19/23 1005)  sterile water (preservative free) injection (  Not Given 05/19/23 1140)  sertraline (ZOLOFT) tablet 100 mg (100 mg Oral Given 05/19/23 1140)  albuterol (PROVENTIL) (2.5 MG/3ML) 0.083% nebulizer solution 2.5 mg (has no administration in time range)  arformoterol (BROVANA) nebulizer solution 15 mcg (15 mcg Nebulization Not Given 05/19/23 1440)    And  umeclidinium bromide (INCRUSE ELLIPTA) 62.5 MCG/ACT 1 puff (has no administration in time range)  sodium chloride 0.9 % bolus 1,000 mL (0 mLs Intravenous Stopped 05/19/23 0528)  ondansetron (ZOFRAN-ODT) disintegrating tablet 4 mg (4 mg Oral Given 05/18/23 2352)  fentaNYL (SUBLIMAZE) injection 50 mcg (50 mcg Intravenous Given 05/19/23 0007)  fentaNYL (SUBLIMAZE) injection 50 mcg (50 mcg Intravenous Given 05/19/23 0250)  sodium chloride 0.9 % bolus 1,000 mL (0 mLs Intravenous Stopped 05/19/23 1144)  potassium chloride SA (KLOR-CON M) CR tablet 40 mEq (40 mEq Oral Given 05/19/23 0930)  sodium chloride 0.9 % bolus 1,000 mL (1,000 mLs Intravenous New Bag/Given 05/19/23 1307)  potassium chloride SA (KLOR-CON M) CR tablet 40 mEq (40 mEq Oral Given 05/19/23 1308)    Mobility walks      Focused Assessments N/A   R Recommendations: See Admitting Provider Note  Report given to:   Additional Notes: N/A

## 2023-05-19 NOTE — Consult Note (Signed)
[name] 31-Jan-1966  [id number].    Requesting MD: Sharilyn Sites, PA-C Chief Complaint/Reason for Consult: gallstones, abdominal pain  HPI:  [name] is a 57 yo female who presented to the ED with abdominal pain. She has been having severe abdominal pain in the RUQ that started about 3 weeks ago. It gets worse with eating and has progressed. She says she has not been eating or drinking for about 2 days now because the pain has gotten so severe. She had some nausea and vomiting yesterday. Labs in the ED showed a significant AKI (Cr 3.59 from baseline of 1.1) and hyponatremia (Na 122). WBC was 15, and lipase was mildly elevated at 91. LFTs were normal. A RUQ US showed cholelithiasis without acute cholecystitis. Patient has been admitted to the hospitalist service and general surgery was consulted.  The patient has previously had an appendectomy. She does not take any blood thinners.  ROS: Review of Systems  Constitutional:  Negative for chills and fever.  Respiratory:  Negative for shortness of breath.   Cardiovascular:  Negative for chest pain.  Gastrointestinal:  Positive for abdominal pain, constipation, nausea and vomiting.    Family History  Problem Relation Age of Onset   Diabetes Mother    Heart disease Mother     Past Medical History:  Diagnosis Date   Asthma    COPD (chronic obstructive pulmonary disease) (HCC)    Diabetes mellitus without complication (HCC)    GERD (gastroesophageal reflux disease)    Heart murmur    from childhood rheumatic fever, per patient   Hyperlipidemia    Pancreatitis    Pneumonia 09/2013   Wears partial dentures    top and bottom partials    Past Surgical History:  Procedure Laterality Date   ANTERIOR LAT LUMBAR FUSION Left 07/31/2013   Procedure: ANTERIOR LATERAL LUMBAR FUSION 1 LEVEL/Left sided lumbar 3-4 lateral interbody fusion with instrumentation and allograft.;  Surgeon: Emilee Hero, MD;  Location: [facility];  Service:  Orthopedics;  Laterality: Left;  Left sided lumbar 3-4 lateral interbody fusion with instrumentation and allograft.   APPENDECTOMY  30- 40 years   carpal tunnel     right arm   CARPAL TUNNEL RELEASE Right 12/15/2013   Procedure: RIGHT CARPAL TUNNEL RELEASE ENDOSCOPIC;  Surgeon: Jodi Marble, MD;  Location: [facility];  Service: Orthopedics;  Laterality: Right;   LATERAL EPICONDYLE RELEASE Right 12/15/2013   Procedure: RIGHT ULNAR NEUROPLASTY AT ELBOW ;  Surgeon: Jodi Marble, MD;  Location: [facility];  Service: Orthopedics;  Laterality: Right;   LUMBAR FUSION      Social History:  reports that she quit smoking about 6 years ago. Her smoking use included cigarettes. She has never used smokeless tobacco. She reports that she does not drink alcohol and does not use drugs.  Allergies: No Known Allergies  (Not in a hospital admission)    Physical Exam: Blood pressure 133/74, pulse 80, temperature 98.6 F (37 C), temperature source Oral, resp. rate (!) 21, last menstrual period 03/07/2012, SpO2 95%. General: resting comfortably, appears stated age, no apparent distress Neurological: alert and oriented, no focal deficits HEENT: normocephalic, atraumatic CV: regular rate and rhythm Respiratory: nonlabored respirations on nasal cannula Abdomen: soft, nondistended, nontender to palpation, no RUQ tenderness to palpation. Extremities: warm and well-perfused, no deformities, moving all extremities spontaneously Psychiatric: normal mood and affect Skin: warm and dry    Assessment/Plan 57 yo female presenting with RUQ pain, nausea,  dehydration and AKI. Her imaging shows cholelithiasis but no signs of acute cholecystitis. Her postprandial RUQ pain is consistent with symptomatic cholelithiasis. Abdomen is nontender at the time of my exam. She is being admitted for hydration and electrolyte correction. Will tentatively plan for laparoscopic cholecystectomy once her  acute kidney injury and metabolic derangements have been corrected. I briefly reviewed gallstone pathophysiology and the planned surgery with the patient and her husband. All questions were answered. Surgery will follow.   [name], MD [facility] General, Hepatobiliary and Pancreatic Surgery 05/19/23 7:44 AM

## 2023-05-19 NOTE — H&P (Signed)
History and Physical    Patient: [name] OAC:[id number] DOB: 1966/05/28 DOA: 05/18/2023 DOS: the patient was seen and examined on 05/19/2023 PCP: [facility]  Patient coming from: Home  Chief Complaint: No chief complaint on file.  HPI: Porsche Santell is a 57 y.o. female with medical history significant of asthma, COPD, chronic respiratory failure with hypoxia, history of tobacco use type 2 diabetes, GERD, history of rheumatic fever, hyperlipidemia, pancreatitis, history pneumonia, hypertension, chronic lower back pain, PTSD who presents to the emergency department with progressively worse RUQ pain for the past 3 weeks associated with nausea, significantly decreased oral intake, but no emesis.  She was constipated for few days.  No diarrhea,  melena or hematochezia.  No flank pain, dysuria, frequency or hematuria.  She denied fever, chills, rhinorrhea, sore throat or hemoptysis.  No chest pain, palpitations, diaphoresis, PND, orthopnea or pitting edema of the lower extremities.  No polyuria, polydipsia, polyphagia or blurred vision.   Lab work: Her urine analysis was hazy with glucose greater than 500 and protein of 100 mg/dL.  There was moderate hemoglobin and rare bacteria microscopic examination.  CBC showed a white count of 15.0, hemoglobin 13.7 g/dL and platelets 010.  Lipase 91 units/L.  CMP with a sodium 122, potassium 3.3, chloride 87 and CO2 22 mmol/L.  Glucose 118, BUN 35 and creatinine 3.59 mg/dL.  Calcium and LFTs were normal.  Imaging: RUQ ultrasound showed cholelithiasis without complicating factors.     ED course: Initial vital signs were temperature 98.4 F, pulse 72, respiration 18, BP 109/61 mmHg and O2 sat 95% on room air.  The patient received ondansetron 4 mg ODT x 1, NS 1000 mm bolus and fentanyl 50 mcg IVP x 2.  Review of Systems: As mentioned in the history of present illness. All other systems reviewed and are negative. Past Medical History:  Diagnosis Date    Asthma    COPD (chronic obstructive pulmonary disease) (HCC)    Diabetes mellitus without complication (HCC)    GERD (gastroesophageal reflux disease)    Heart murmur    from childhood rheumatic fever, per patient   Hyperlipidemia    Pancreatitis    Pneumonia 09/2013   Wears partial dentures    top and bottom partials   Past Surgical History:  Procedure Laterality Date   ANTERIOR LAT LUMBAR FUSION Left 07/31/2013   Procedure: ANTERIOR LATERAL LUMBAR FUSION 1 LEVEL/Left sided lumbar 3-4 lateral interbody fusion with instrumentation and allograft.;  Surgeon: Emilee Hero, MD;  Location: [facility];  Service: Orthopedics;  Laterality: Left;  Left sided lumbar 3-4 lateral interbody fusion with instrumentation and allograft.   APPENDECTOMY  30- 40 years   carpal tunnel     right arm   CARPAL TUNNEL RELEASE Right 12/15/2013   Procedure: RIGHT CARPAL TUNNEL RELEASE ENDOSCOPIC;  Surgeon: Jodi Marble, MD;  Location: [facility];  Service: Orthopedics;  Laterality: Right;   LATERAL EPICONDYLE RELEASE Right 12/15/2013   Procedure: RIGHT ULNAR NEUROPLASTY AT ELBOW ;  Surgeon: Jodi Marble, MD;  Location: [facility];  Service: Orthopedics;  Laterality: Right;   LUMBAR FUSION     Social History:  reports that she quit smoking about 6 years ago. Her smoking use included cigarettes. She has never used smokeless tobacco. She reports that she does not drink alcohol and does not use drugs.  No Known Allergies  Family History  Problem Relation Age of Onset   Diabetes Mother  Heart disease Mother     Prior to Admission medications   Medication Sig Start Date End Date Taking? Authorizing Provider  albuterol (PROVENTIL) (2.5 MG/3ML) 0.083% nebulizer solution Inhale 1 vial via nebulizer every 6 (six) hours as needed for shortness of breath. 07/31/22  Yes Danford, Earl Lites, MD  albuterol (VENTOLIN HFA) 108 (90 Base) MCG/ACT inhaler INHALE 1 TO 2 PUFFS INTO  THE LUNGS EVERY 6 HOURS AS NEEDED FOR WHEEZING OR SHORTNESS OF BREATH Patient taking differently: Inhale 1-2 puffs into the lungs every 6 (six) hours as needed for wheezing or shortness of breath. 01/20/22  Yes Leslye Peer, MD  ALPRAZolam Prudy Feeler) 0.5 MG tablet Take 0.5 mg by mouth at bedtime. 07/04/22  Yes [provider]  baclofen (LIORESAL) 10 MG tablet Take 10 mg by mouth 3 (three) times daily as needed for muscle spasms.   Yes [provider]  JARDIANCE 25 MG TABS tablet Take 25 mg by mouth daily. 04/10/23  Yes [provider]  LANTUS SOLOSTAR 100 UNIT/ML Solostar Pen Inject 20 Units into the skin at bedtime. Patient taking differently: Inject 65 Units into the skin at bedtime. 10/26/21  Yes Zannie Cove, MD  MOUNJARO 2.5 MG/0.5ML Pen Inject 2.5 mg into the skin once a week. tuesday 04/23/23  Yes [provider]  naloxone (NARCAN) nasal spray 4 mg/0.1 mL Place 0.4 mg into the nose as needed (as directed for opioid emergency). 02/23/22  Yes [provider]  olmesartan-hydrochlorothiazide (BENICAR HCT) 40-25 MG tablet Take 1 tablet by mouth daily. 08/01/22  Yes Leslye Peer, MD  oxyCODONE-acetaminophen (PERCOCET) 10-325 MG tablet Take 1 tablet by mouth 5 (five) times daily. 10/28/20  Yes [provider]  pantoprazole (PROTONIX) 40 MG tablet Take 40 mg by mouth daily before breakfast.   Yes [provider]  rosuvastatin (CRESTOR) 40 MG tablet Take 40 mg by mouth every evening. 09/28/20  Yes [provider]  sertraline (ZOLOFT) 100 MG tablet Take 100 mg by mouth in the morning.   Yes [provider]  STIOLTO RESPIMAT 2.5-2.5 MCG/ACT AERS INHALE 2 PUFFS INTO THE LUNGS DAILY 02/08/23  Yes Byrum, Les Pou, MD  ACCU-CHEK AVIVA PLUS test strip USE AS DIRECTED FOR 30 DAYS 04/07/18   [provider]  JANUMET 50-1000 MG tablet Take 1 tablet by mouth 2 (two) times daily. Patient not taking: Reported on 05/18/2023 10/03/20    [provider]  lisinopril-hydrochlorothiazide (ZESTORETIC) 20-25 MG tablet Take 1 tablet by mouth daily. Patient not taking: Reported on 05/18/2023 12/29/22   [provider]  Nicotine (NICODERM CQ TD) Place 1 patch onto the skin daily as needed (for smoking cessation while hospitalized). Patient not taking: Reported on 01/19/2023    [provider]  RELION PEN NEEDLE 31G/8MM 31G X 8 MM MISC Inject 1 each into the skin daily. use as directed 10/01/18   Lezlie Lye, Meda Coffee, MD    Physical Exam: Vitals:   05/19/23 0600 05/19/23 0615 05/19/23 0630 05/19/23 0715  BP: 130/63 (!) 143/68 139/67 133/74  Pulse: 70 68 71 80  Resp: 15 18 18  (!) 21  Temp:      TempSrc:      SpO2: 93% 91% 91% 95%   Physical Exam Vitals and nursing note reviewed.  Constitutional:      General: She is awake. She is not in acute distress.    Appearance: Normal appearance. She is obese.  HENT:     Head: Normocephalic.  Nose: No rhinorrhea.     Mouth/Throat:     Mouth: Mucous membranes are dry.  Eyes:     General: No scleral icterus.    Pupils: Pupils are equal, round, and reactive to light.  Neck:     Vascular: No JVD.  Cardiovascular:     Rate and Rhythm: Normal rate and regular rhythm.     Heart sounds: S1 normal and S2 normal.  Pulmonary:     Effort: Pulmonary effort is normal.     Breath sounds: Normal breath sounds. No wheezing, rhonchi or rales.  Abdominal:     General: Bowel sounds are normal. There is no distension.     Palpations: Abdomen is soft.     Tenderness: There is abdominal tenderness in the right upper quadrant. There is right CVA tenderness. There is no left CVA tenderness, guarding or rebound.  Musculoskeletal:     Cervical back: Neck supple.     Right lower leg: No edema.     Left lower leg: No edema.  Skin:    General: Skin is warm and dry.  Neurological:     General: No focal deficit present.     Mental Status: She is alert and oriented to  person, place, and time.  Psychiatric:        Mood and Affect: Mood normal.        Behavior: Behavior normal. Behavior is cooperative.     Data Reviewed:  Results are pending, will review when available.  09/11/2021 transthoracic echocardiogram IMPRESSIONS:   1. Left ventricular ejection fraction, by estimation, is 65 to 70%. The  left ventricle has normal function. The left ventricle has no regional  wall motion abnormalities. There is mild left ventricular hypertrophy.  Left ventricular diastolic parameters  are indeterminate.   2. Right ventricular systolic function is normal. The right ventricular  size is normal. Tricuspid regurgitation signal is inadequate for assessing  PA pressure.   3. The mitral valve is normal in structure. No evidence of mitral valve  regurgitation. No evidence of mitral stenosis.   4. The aortic valve was not well visualized. Aortic valve regurgitation  is not visualized. Mild aortic valve stenosis. Vmax 2.2 m/s, MG , DI  0.56, AVA 1.8cm^2    Assessment and Plan: Principal Problem:   AKI (acute kidney injury) (HCC) IP/telemetry. Continue IV fluids. Hold ARB/ACE. Hold diuretic. Avoid hypotension. Avoid nephrotoxins. Monitor intake and output. Monitor renal function electrolytes.  Active Problems:   Cholelithiasis Superimposed on:   Chronic pancreatitis, unspecified pancreatitis type (HCC) Lipase mildly elevated, but no obstructive pattern seen. Clear liquid diet for now analgesics and antiemetics as needed. General surgery consult appreciated. -Will optimize medically before any procedures.    Hyponatremia   Hypokalemia Secondary to HCTZ use/recent poor oral intake. Replenishing both now. Follow-up electrolytes closely.    GERD (gastroesophageal reflux disease) On IV pantoprazole every 24 hours.    Essential hypertension, benign BP has been soft. Hold antihypertensives.    Hyperlipidemia Currently not on  therapy. Follow-up with primary care provider.    Chronic leukocytosis Monitor WBC.    Advance Care Planning:   Code Status: Full Code   Consults: CCS [name], MD)  Family Communication: Her significant other was at bedside.  Severity of Illness: The appropriate patient status for this patient is INPATIENT. Inpatient status is judged to be reasonable and necessary in order to provide the required intensity of service to ensure the patient's safety. The patient's presenting symptoms, physical exam  findings, and initial radiographic and laboratory data in the context of their chronic comorbidities is felt to place them at high risk for further clinical deterioration. Furthermore, it is not anticipated that the patient will be medically stable for discharge from the hospital within 2 midnights of admission.   * I certify that at the point of admission it is my clinical judgment that the patient will require inpatient hospital care spanning beyond 2 midnights from the point of admission due to high intensity of service, high risk for further deterioration and high frequency of surveillance required.*  Author: Bobette Mo, MD 05/19/2023 8:11 AM  For on call review [email or web address].   This document was prepared using Dragon voice recognition software and may contain some unintended transcription errors.

## 2023-05-19 NOTE — Plan of Care (Signed)
New admission

## 2023-05-19 NOTE — H&P (View-Only) (Signed)
[name] 31-Jan-1966  [id number].    Requesting MD: Sharilyn Sites, PA-C Chief Complaint/Reason for Consult: gallstones, abdominal pain  HPI:  [name] is a 57 yo female who presented to the ED with abdominal pain. She has been having severe abdominal pain in the RUQ that started about 3 weeks ago. It gets worse with eating and has progressed. She says she has not been eating or drinking for about 2 days now because the pain has gotten so severe. She had some nausea and vomiting yesterday. Labs in the ED showed a significant AKI (Cr 3.59 from baseline of 1.1) and hyponatremia (Na 122). WBC was 15, and lipase was mildly elevated at 91. LFTs were normal. A RUQ US showed cholelithiasis without acute cholecystitis. Patient has been admitted to the hospitalist service and general surgery was consulted.  The patient has previously had an appendectomy. She does not take any blood thinners.  ROS: Review of Systems  Constitutional:  Negative for chills and fever.  Respiratory:  Negative for shortness of breath.   Cardiovascular:  Negative for chest pain.  Gastrointestinal:  Positive for abdominal pain, constipation, nausea and vomiting.    Family History  Problem Relation Age of Onset   Diabetes Mother    Heart disease Mother     Past Medical History:  Diagnosis Date   Asthma    COPD (chronic obstructive pulmonary disease) (HCC)    Diabetes mellitus without complication (HCC)    GERD (gastroesophageal reflux disease)    Heart murmur    from childhood rheumatic fever, per patient   Hyperlipidemia    Pancreatitis    Pneumonia 09/2013   Wears partial dentures    top and bottom partials    Past Surgical History:  Procedure Laterality Date   ANTERIOR LAT LUMBAR FUSION Left 07/31/2013   Procedure: ANTERIOR LATERAL LUMBAR FUSION 1 LEVEL/Left sided lumbar 3-4 lateral interbody fusion with instrumentation and allograft.;  Surgeon: Emilee Hero, MD;  Location: [facility];  Service:  Orthopedics;  Laterality: Left;  Left sided lumbar 3-4 lateral interbody fusion with instrumentation and allograft.   APPENDECTOMY  30- 40 years   carpal tunnel     right arm   CARPAL TUNNEL RELEASE Right 12/15/2013   Procedure: RIGHT CARPAL TUNNEL RELEASE ENDOSCOPIC;  Surgeon: Jodi Marble, MD;  Location: [facility];  Service: Orthopedics;  Laterality: Right;   LATERAL EPICONDYLE RELEASE Right 12/15/2013   Procedure: RIGHT ULNAR NEUROPLASTY AT ELBOW ;  Surgeon: Jodi Marble, MD;  Location: [facility];  Service: Orthopedics;  Laterality: Right;   LUMBAR FUSION      Social History:  reports that she quit smoking about 6 years ago. Her smoking use included cigarettes. She has never used smokeless tobacco. She reports that she does not drink alcohol and does not use drugs.  Allergies: No Known Allergies  (Not in a hospital admission)    Physical Exam: Blood pressure 133/74, pulse 80, temperature 98.6 F (37 C), temperature source Oral, resp. rate (!) 21, last menstrual period 03/07/2012, SpO2 95%. General: resting comfortably, appears stated age, no apparent distress Neurological: alert and oriented, no focal deficits HEENT: normocephalic, atraumatic CV: regular rate and rhythm Respiratory: nonlabored respirations on nasal cannula Abdomen: soft, nondistended, nontender to palpation, no RUQ tenderness to palpation. Extremities: warm and well-perfused, no deformities, moving all extremities spontaneously Psychiatric: normal mood and affect Skin: warm and dry    Assessment/Plan 57 yo female presenting with RUQ pain, nausea,  dehydration and AKI. Her imaging shows cholelithiasis but no signs of acute cholecystitis. Her postprandial RUQ pain is consistent with symptomatic cholelithiasis. Abdomen is nontender at the time of my exam. She is being admitted for hydration and electrolyte correction. Will tentatively plan for laparoscopic cholecystectomy once her  acute kidney injury and metabolic derangements have been corrected. I briefly reviewed gallstone pathophysiology and the planned surgery with the patient and her husband. All questions were answered. Surgery will follow.   [name], MD [facility] General, Hepatobiliary and Pancreatic Surgery 05/19/23 7:44 AM

## 2023-05-19 NOTE — ED Provider Notes (Signed)
  Assumed care at shift change.  Awaiting hospitalist call back for admission.  Patient has significant AKI with SrCr of 3.59, baseline is around 1, likely from poor oral intake.  [place] with gallstones but not findings of acute cholecystitis that would require surgical intervention at this time.  LFT's, alk phos, and bili all WNL.  Has received IVF.  2:05 AM Delay in admission.  Spoke with flow manager-- reports no consult present, however clearly present in chart placed at 2311 and appears it was called at 2319.  I have placed additional page for hospitalist, awaiting callback.  Spoke with Dr. Cyndia Bent-- will admit for ongoing care.  Secure chat message to general surgery team for AM consultation.   Garlon Hatchet, PA-C 05/19/23 Philis Fendt, MD 05/19/23 [phone]

## 2023-05-19 NOTE — ED Notes (Signed)
Equipment in room is giving low blood pressure readings around 90/60, however when checked with different equipment pt's pressure within norm Pt also has good strong radial pulse

## 2023-05-20 DIAGNOSIS — K8020 Calculus of gallbladder without cholecystitis without obstruction: Secondary | ICD-10-CM | POA: Diagnosis not present

## 2023-05-20 DIAGNOSIS — N179 Acute kidney failure, unspecified: Secondary | ICD-10-CM

## 2023-05-20 DIAGNOSIS — E871 Hypo-osmolality and hyponatremia: Secondary | ICD-10-CM

## 2023-05-20 LAB — COMPREHENSIVE METABOLIC PANEL WITH GFR
ALT: 21 U/L (ref 0–44)
AST: 34 U/L (ref 15–41)
Albumin: 3.3 g/dL — ABNORMAL LOW (ref 3.5–5.0)
Alkaline Phosphatase: 49 U/L (ref 38–126)
Anion gap: 9 (ref 5–15)
BUN: 23 mg/dL — ABNORMAL HIGH (ref 6–20)
CO2: 18 mmol/L — ABNORMAL LOW (ref 22–32)
Calcium: 9 mg/dL (ref 8.9–10.3)
Chloride: 112 mmol/L — ABNORMAL HIGH (ref 98–111)
Creatinine, Ser: 2.11 mg/dL — ABNORMAL HIGH (ref 0.44–1.00)
GFR, Estimated: 26 mL/min — ABNORMAL LOW (ref >60)
Glucose, Bld: 114 mg/dL — ABNORMAL HIGH (ref 70–99)
Potassium: 4.6 mmol/L (ref 3.5–5.1)
Sodium: 139 mmol/L (ref 135–145)
Total Bilirubin: 1 mg/dL (ref 0.3–1.2)
Total Protein: 6.4 g/dL — ABNORMAL LOW (ref 6.5–8.1)

## 2023-05-20 LAB — CBC
HCT: 37 % (ref 36.0–46.0)
Hemoglobin: 11.6 g/dL — ABNORMAL LOW (ref 12.0–15.0)
MCH: 30.1 pg (ref 26.0–34.0)
MCHC: 31.4 g/dL (ref 30.0–36.0)
MCV: 96.1 fL (ref 80.0–100.0)
Platelets: 253 10*3/uL (ref 150–400)
RBC: 3.85 MIL/uL — ABNORMAL LOW (ref 3.87–5.11)
RDW: 13.2 % (ref 11.5–15.5)
WBC: 9.1 10*3/uL (ref 4.0–10.5)
nRBC: 0 % (ref 0.0–0.2)

## 2023-05-20 LAB — GLUCOSE, CAPILLARY
Glucose-Capillary: 119 mg/dL — ABNORMAL HIGH (ref 70–99)
Glucose-Capillary: 157 mg/dL — ABNORMAL HIGH (ref 70–99)
Glucose-Capillary: 156 mg/dL — ABNORMAL HIGH (ref 70–99)
Glucose-Capillary: 161 mg/dL — ABNORMAL HIGH (ref 70–99)

## 2023-05-20 LAB — SURGICAL PCR SCREEN
MRSA, PCR: NEGATIVE
Staphylococcus aureus: NEGATIVE

## 2023-05-20 MED ORDER — CHLORHEXIDINE GLUCONATE CLOTH 2 % EX PADS
6.0000 | MEDICATED_PAD | Freq: Once | CUTANEOUS | Status: AC
  Administered 2023-05-20: 6 via TOPICAL

## 2023-05-20 MED ORDER — CHLORHEXIDINE GLUCONATE CLOTH 2 % EX PADS
6.0000 | MEDICATED_PAD | Freq: Once | CUTANEOUS | Status: AC
  Administered 2023-05-21: 6 via TOPICAL

## 2023-05-20 MED ORDER — ACETAMINOPHEN 500 MG PO TABS
1000.0000 mg | ORAL_TABLET | ORAL | Status: AC
  Administered 2023-05-21: 1000 mg via ORAL
  Filled 2023-05-20: qty 2

## 2023-05-20 MED ORDER — SODIUM CHLORIDE 0.9 % IV SOLN
2.0000 g | INTRAVENOUS | Status: AC
  Administered 2023-05-21: 2 g via INTRAVENOUS
  Filled 2023-05-20: qty 20

## 2023-05-20 MED ORDER — ENSURE PRE-SURGERY PO LIQD
296.0000 mL | Freq: Once | ORAL | Status: AC
  Administered 2023-05-21: 296 mL via ORAL
  Filled 2023-05-20: qty 296

## 2023-05-20 MED ORDER — GABAPENTIN 300 MG PO CAPS
300.0000 mg | ORAL_CAPSULE | ORAL | Status: AC
  Administered 2023-05-21: 300 mg via ORAL
  Filled 2023-05-20: qty 1

## 2023-05-20 NOTE — Progress Notes (Signed)
Subjective: Overall feeling better, still having intermittent RUQ pain. Tolerating clear liquids and feels hungry. Afebrile. Creatinine improving.  ROS: See above, otherwise other systems negative  Objective: Vital signs in last 24 hours: Temp:  [98.2 F (36.8 C)-99.6 F (37.6 C)] 98.9 F (37.2 C) (08/11 0548) Pulse Rate:  [65-79] 73 (08/11 0548) Resp:  [17-20] 18 (08/11 0548) BP: (87-136)/(44-124) 95/48 (08/11 0548) SpO2:  [88 %-100 %] 95 % (08/11 0808) Weight:  [81.8 kg] 81.8 kg (08/10 1733) Last BM Date : 05/20/23  Intake/Output from previous day: 08/10 0701 - 08/11 0700 In: 2476.8 [P.O.:360; I.V.:1117.8; IV Piggyback:999] Out: -  Intake/Output this shift: No intake/output data recorded.  PE: General: resting comfortably, NAD Neuro: alert and oriented, no focal deficits Resp: normal work of breathing on room air Abdomen: soft, nondistended, nontender to palpation. Extremities: warm and well-perfused   Lab Results:  Recent Labs    05/19/23 0820 05/20/23 0458  WBC 11.9* 9.1  HGB 13.4 11.6*  HCT 41.5 37.0  PLT 259 253   BMET Recent Labs    05/19/23 0820 05/20/23 0458  NA 130* 139  K 3.0* 4.6  CL 99 112*  CO2 21* 18*  GLUCOSE 100* 114*  BUN 34* 23*  CREATININE 2.96* 2.11*  CALCIUM 8.9 9.0   PT/INR No results for input(s): "LABPROT", "INR" in the last 72 hours. CMP     Component Value Date/Time   NA 139 05/20/2023 0458   NA 139 09/11/2018 1557   K 4.6 05/20/2023 0458   CL 112 (H) 05/20/2023 0458   CO2 18 (L) 05/20/2023 0458   GLUCOSE 114 (H) 05/20/2023 0458   BUN 23 (H) 05/20/2023 0458   BUN 7 09/11/2018 1557   CREATININE 2.11 (H) 05/20/2023 0458   CREATININE 1.07 (H) 04/26/2021 0908   CALCIUM 9.0 05/20/2023 0458   PROT 6.4 (L) 05/20/2023 0458   PROT 7.6 09/11/2018 1557   ALBUMIN 3.3 (L) 05/20/2023 0458   ALBUMIN 4.4 09/11/2018 1557   AST 34 05/20/2023 0458   AST 20 04/26/2021 0908   ALT 21 05/20/2023 0458   ALT 20 04/26/2021  0908   ALKPHOS 49 05/20/2023 0458   BILITOT 1.0 05/20/2023 0458   BILITOT <0.2 (L) 04/26/2021 0908   GFRNONAA 26 (L) 05/20/2023 0458   GFRNONAA >60 04/26/2021 0908   GFRAA 78 09/11/2018 1557   Lipase     Component Value Date/Time   LIPASE 91 (H) 05/18/2023 1759       Studies/Results: US Abdomen Limited RUQ (LIVER/GB)  Result Date: 05/18/2023 CLINICAL DATA:  Cholelithiasis EXAM: ULTRASOUND ABDOMEN LIMITED RIGHT UPPER QUADRANT COMPARISON:  10/29/2020 FINDINGS: Gallbladder: Gallbladder is well distended with evidence of cholelithiasis. No wall thickening or pericholecystic fluid is noted. Negative sonographic Murphy's sign is elicited. Common bile duct: Diameter: 3.1 mm. Liver: Generally increased in echogenicity consistent with fatty infiltration. Portal vein is patent on color Doppler imaging with normal direction of blood flow towards the liver. Other: None. IMPRESSION: Cholelithiasis without complicating factors. Electronically Signed   By: Alcide Clever M.D.   On: 05/18/2023 23:08        Assessment/Plan 57 yo female admitted with severe epigastric/RUQ abdominal pain and cholelithiasis, with vomiting and AKI secondary to dehydration. - AKI and hyponatremia improving with IV fluid hydration - Advance to low fat diet - Symptoms are consistent with symptomatic cholelithiasis. Tentatively plan for laparoscopic cholecystectomy tomorrow now that electrolytes are improving. Please keep NPO after midnight tonight.  - No  indication for antibiotics   LOS: 1 day    [name], MD [facility] General, Hepatobiliary and Pancreatic Surgery 05/20/23 9:28 AM

## 2023-05-20 NOTE — Progress Notes (Signed)
[facility] PROGRESS NOTE    Progress Note  [name]  WUJ:[id number] DOB: Feb 24, 1966 DOA: 05/18/2023 PCP: [facility]     Brief Narrative:   Daquana Hammell is an 57 y.o. female past medical history of asthma, chronic respiratory failure with hypoxia, tobacco abuse, diabetes mellitus type 2 PTSD comes into the emergency room for progressive right upper quadrant pain for the past 3 weeks with nausea, in the ED had a white count of 15 hemoglobin of 13 lipase of 91 sodium 122 chloride 87, potassium 3.3 right upper quadrant ultrasound showed choledocholithiasis without acute cholecystitis   Assessment/Plan:   AKI (acute kidney injury) (HCC) In the setting of ARB and diuretic use. Likely prerenal azotemia, with a baseline creatinine of less than 1 on admission 3.5. Was started on IV fluids creatinine this morning is 2.1. Continue IV fluids recheck basic metabolic panel in the morning.  Chronic pancreatitis/choledocholithiasis: Started on clear liquid diet antiemetics and narcotics for analgesics.  Physical exam was unremarkable General surgery was consulted, who plans tentatively lap chole once metabolic derangements have been corrected.  Hypovolemic hyponatremia/hypokalemia: Potassium repleted now improved. Started on IV fluids his hyponatremia is resolved.  GERD (gastroesophageal reflux disease) Continue PPI.  Essential hypertension: Blood pressure is soft agree with holding lisinopril and ACE inhibitor. Continue IV fluids.  Hyperlipidemia: Follow-up with PCP.  DVT prophylaxis: lovenxo Family Communication:HUsband Status is: Inpatient Remains inpatient appropriate because: Acute kidney injury    Code Status:     Code Status Orders  (From admission, onward)           Start     Ordered   05/19/23 0835  Full code  Continuous       Question:  By:  Answer:  Consent: discussion documented in EHR   05/19/23 0856           Code Status History      Date Active Date Inactive Code Status Order ID Comments User Context   07/30/2022 1949 07/31/2022 1645 Full Code [id number]  Alberteen Sam, MD ED   09/30/2021 2134 10/26/2021 1648 Full Code [id number]  Briant Sites, DO ED   10/05/2013 2010 10/07/2013 1628 Full Code [id number]  Eddie North, MD Inpatient   07/31/2013 1959 08/03/2013 1614 Full Code [id number]  [name], PA-C Inpatient         IV Access:   Peripheral IV   Procedures and diagnostic studies:   US Abdomen Limited RUQ (LIVER/GB)  Result Date: 05/18/2023 CLINICAL DATA:  Cholelithiasis EXAM: ULTRASOUND ABDOMEN LIMITED RIGHT UPPER QUADRANT COMPARISON:  10/29/2020 FINDINGS: Gallbladder: Gallbladder is well distended with evidence of cholelithiasis. No wall thickening or pericholecystic fluid is noted. Negative sonographic Murphy's sign is elicited. Common bile duct: Diameter: 3.1 mm. Liver: Generally increased in echogenicity consistent with fatty infiltration. Portal vein is patent on color Doppler imaging with normal direction of blood flow towards the liver. Other: None. IMPRESSION: Cholelithiasis without complicating factors. Electronically Signed   By: Alcide Clever M.D.   On: 05/18/2023 23:08     Medical Consultants:   None.   Subjective:    [name] no complaints  Objective:    Vitals:   05/19/23 2040 05/19/23 2340 05/20/23 0548 05/20/23 0808  BP: (!) 100/58 (!) 106/58 (!) 95/48   Pulse: 73 72 73   Resp:  17 18   Temp:  99.6 F (37.6 C) 98.9 F (37.2 C)   TempSrc:  Oral Oral   SpO2: 93% 92% 91% 95%  Weight:  Height:       SpO2: 95 % O2 Flow Rate (L/min): 2 L/min   Intake/Output Summary (Last 24 hours) at 05/20/2023 4098 Last data filed at 05/19/2023 1900 Gross per 24 hour  Intake 2476.78 ml  Output --  Net 2476.78 ml   Filed Weights   05/19/23 1733  Weight: 81.8 kg    Exam: General exam: In no acute distress. Respiratory system: Good air movement and clear  to auscultation. Cardiovascular system: S1 & S2 heard, RRR. No JVD. Gastrointestinal system: Abdomen is nondistended, soft and nontender.  Extremities: No pedal edema. Skin: No rashes, lesions or ulcers Psychiatry: Judgement and insight appear normal. Mood & affect appropriate.    Data Reviewed:    Labs: Basic Metabolic Panel: Recent Labs  Lab 05/18/23 1759 05/19/23 0820 05/20/23 0458  NA 122* 130* 139  K 3.3* 3.0* 4.6  CL 87* 99 112*  CO2 22 21* 18*  GLUCOSE 118* 100* 114*  BUN 35* 34* 23*  CREATININE 3.59* 2.96* 2.11*  CALCIUM 9.3 8.9 9.0  MG  --  2.3  --   PHOS  --  4.5  --    GFR Estimated Creatinine Clearance: 29.7 mL/min (A) (by C-G formula based on SCr of 2.11 mg/dL (H)). Liver Function Tests: Recent Labs  Lab 05/18/23 1759 05/19/23 0820 05/20/23 0458  AST 26 23 34  ALT 30 25 21   ALKPHOS 69 62 49  BILITOT 0.8 0.5 1.0  PROT 8.1 7.1 6.4*  ALBUMIN 4.1 3.6 3.3*   Recent Labs  Lab 05/18/23 1759  LIPASE 91*   No results for input(s): "AMMONIA" in the last 168 hours. Coagulation profile No results for input(s): "INR", "PROTIME" in the last 168 hours. COVID-19 Labs  No results for input(s): "DDIMER", "FERRITIN", "LDH", "CRP" in the last 72 hours.  Lab Results  Component Value Date   SARSCOV2NAA NEGATIVE 07/30/2022   SARSCOV2NAA NEGATIVE 09/30/2021    CBC: Recent Labs  Lab 05/18/23 1759 05/19/23 0820 05/20/23 0458  WBC 15.0* 11.9* 9.1  HGB 13.7 13.4 11.6*  HCT 42.0 41.5 37.0  MCV 90.7 92.2 96.1  PLT 300 259 253   Cardiac Enzymes: No results for input(s): "CKTOTAL", "CKMB", "CKMBINDEX", "TROPONINI" in the last 168 hours. BNP (last 3 results) No results for input(s): "PROBNP" in the last 8760 hours. CBG: Recent Labs  Lab 05/18/23 1848 05/19/23 1156 05/19/23 1702 05/19/23 2114 05/20/23 0730  GLUCAP 107* 78 212* 95 119*   D-Dimer: No results for input(s): "DDIMER" in the last 72 hours. Hgb A1c: No results for input(s): "HGBA1C" in  the last 72 hours. Lipid Profile: No results for input(s): "CHOL", "HDL", "LDLCALC", "TRIG", "CHOLHDL", "LDLDIRECT" in the last 72 hours. Thyroid function studies: No results for input(s): "TSH", "T4TOTAL", "T3FREE", "THYROIDAB" in the last 72 hours.  Invalid input(s): "FREET3" Anemia work up: No results for input(s): "VITAMINB12", "FOLATE", "FERRITIN", "TIBC", "IRON", "RETICCTPCT" in the last 72 hours. Sepsis Labs: Recent Labs  Lab 05/18/23 1759 05/19/23 0820 05/20/23 0458  WBC 15.0* 11.9* 9.1   Microbiology No results found for this or any previous visit (from the past 240 hour(s)).   Medications:    arformoterol  15 mcg Nebulization BID   And   umeclidinium bromide  1 puff Inhalation Daily   heparin  5,000 Units Subcutaneous Q8H   insulin aspart  0-9 Units Subcutaneous TID WC   pantoprazole (PROTONIX) IV  40 mg Intravenous Q24H   pneumococcal 20-valent conjugate vaccine  0.5 mL Intramuscular Tomorrow-1000  sertraline  100 mg Oral Daily   Continuous Infusions:  sodium chloride 125 mL/hr at 05/19/23 2350      LOS: 1 day   Marinda Elk  [facility]  05/20/2023, 8:22 AM

## 2023-05-20 NOTE — TOC Initial Note (Signed)
Transition of Care [facility]) - Initial/Assessment Note    Patient Details  Name: [name] MRN: [id number] Date of Birth: 1966-04-11  [facility] of [facility]) CM/SW Contact:    Adrian Prows, RN Phone Number: 05/20/2023, 11:09 AM  Clinical Narrative:                 TOC for d/c planning; spoke w/ pt and husband/POC Perry Mount [phone]) in room; pt says she is from home and plans to return at d/c; she denies SDOH risks; pt says she has home oxygen w/ [employer], and she has a full travel tank; pt says she has dentures (upper/lower) and glasses; pt says she has transportation; she verified insurance and PCP; TOC will follow.  Expected Discharge Plan: Home/Self Care Barriers to Discharge: Continued Medical Work up   Patient Goals and CMS Choice Patient states their goals for this hospitalization and ongoing recovery are:: home          Expected Discharge Plan and Services   Discharge Planning Services: CM Consult Post Acute Care Choice: Resumption of Svcs/PTA Provider Living arrangements for the past 2 months: Single Family Home                                      Prior Living Arrangements/Services Living arrangements for the past 2 months: Single Family Home Lives with:: Spouse Patient language and need for interpreter reviewed:: Yes Do you feel safe going back to the place where you live?: Yes      Need for Family Participation in Patient Care: Yes (Comment) Care giver support system in place?: Yes (comment) Current home services: DME (home oxygen w/ [employer]; walker, BSC, shower chair) Criminal Activity/Legal Involvement Pertinent to Current Situation/Hospitalization: No - Comment as needed  Activities of Daily Living Home Assistive Devices/Equipment: None ADL Screening (condition at time of admission) Patient's cognitive ability adequate to safely complete daily activities?: Yes Is the patient deaf or have difficulty hearing?: No Does the  patient have difficulty seeing, even when wearing glasses/contacts?: No Does the patient have difficulty concentrating, remembering, or making decisions?: No Patient able to express need for assistance with ADLs?: No Does the patient have difficulty dressing or bathing?: No Independently performs ADLs?: Yes (appropriate for developmental age) Does the patient have difficulty walking or climbing stairs?: No Weakness of Legs: None Weakness of Arms/Hands: None  Permission Sought/Granted Permission sought to share information with : Case Manager Permission granted to share information with : Yes, Verbal Permission Granted  Share Information with NAME: Case Manager     Permission granted to share info w Relationship: Perry Mount (spouse) [phone]     Emotional Assessment Appearance:: Appears stated age Attitude/Demeanor/Rapport: Gracious Affect (typically observed): Accepting Orientation: : Oriented to Self, Oriented to Place, Oriented to  Time, Oriented to Situation Alcohol / Substance Use: Not Applicable Psych Involvement: No (comment)  Admission diagnosis:  Hyponatremia [E87.1] Symptomatic cholelithiasis [K80.20] AKI (acute kidney injury) (HCC) [N17.9] Patient Active Problem List   Diagnosis Date Noted   AKI (acute kidney injury) (HCC) 05/19/2023   Hyponatremia 05/19/2023   Hypokalemia 05/19/2023   Cholelithiasis 05/19/2023   Allergic rhinitis 01/19/2023   COPD exacerbation (HCC) 07/30/2022   Obesity (BMI 30-39.9) 07/30/2022   Chronic leukocytosis 07/30/2022   Chronic diastolic heart failure (HCC) 11/07/2021   Type 2 diabetes mellitus (HCC) 11/07/2021   Chronic respiratory failure with hypoxia (HCC) 09/30/2021  Chronic pancreatitis, unspecified pancreatitis type (HCC) 04/20/2021   Chronic midline low back pain without sciatica 05/15/2018   Uncontrolled type 2 diabetes mellitus with hyperglycemia (HCC) 05/14/2018   Encounter for long-term (current) use of insulin (HCC)  05/14/2018   PTSD (post-traumatic stress disorder) 05/14/2018   Essential hypertension, benign 05/14/2018   Hyperlipidemia 05/14/2018   COPD with asthma 05/14/2018   Atypical pneumonia 10/05/2013   GERD (gastroesophageal reflux disease) 10/05/2013   Tobacco use 10/05/2013   PCP:  [facility]:   [id number] [place], [place] - [address] AT [facility] & [address] [place] [address] Phone: [phone] Fax: [phone]  [place] [facility] [address] [address] Phone: [phone] Fax: [phone]     Social Determinants of Health (SDOH) Social History: SDOH Screenings   Food Insecurity: No Food Insecurity (05/20/2023)  Housing: Patient Declined (05/20/2023)  Transportation Needs: No Transportation Needs (05/20/2023)  Utilities: Not At Risk (05/20/2023)  Depression (PHQ2-9): Low Risk  (01/02/2019)  Tobacco Use: Medium Risk (05/18/2023)   SDOH Interventions: Food Insecurity Interventions: Intervention Not Indicated, Inpatient TOC Housing Interventions: Intervention Not Indicated, Inpatient TOC Transportation Interventions: Intervention Not Indicated, Inpatient TOC Utilities Interventions: Intervention Not Indicated, Inpatient TOC   Readmission Risk Interventions    05/20/2023   11:06 AM 10/26/2021   11:22 AM 10/20/2021    3:18 PM  Readmission Risk Prevention Plan  Transportation Screening Complete Complete Complete  PCP or Specialist Appt within 5-7 Days Complete    Home Care Screening Complete    Medication Review (RN CM) Complete    Medication Review (RN Care Manager)  Complete Referral to Pharmacy  PCP or Specialist appointment within 3-5 days of discharge  Complete --  HRI or Home Care Consult  Complete Complete  SW Recovery Care/Counseling Consult  Complete Complete  Palliative Care Screening  Not Applicable Not Applicable  Skilled Nursing  Facility  Not Applicable Patient Refused

## 2023-05-21 ENCOUNTER — Inpatient Hospital Stay (HOSPITAL_COMMUNITY): Payer: Medicare HMO

## 2023-05-21 ENCOUNTER — Other Ambulatory Visit: Payer: Self-pay

## 2023-05-21 ENCOUNTER — Inpatient Hospital Stay (HOSPITAL_COMMUNITY): Payer: Medicare HMO | Admitting: Anesthesiology

## 2023-05-21 ENCOUNTER — Encounter (HOSPITAL_COMMUNITY): Payer: Self-pay | Admitting: *Deleted

## 2023-05-21 ENCOUNTER — Encounter (HOSPITAL_COMMUNITY)
Admission: EM | Disposition: A | Discharge status: Home / Self Care | Payer: Self-pay | Source: Home / Self Care | Attending: Internal Medicine

## 2023-05-21 DIAGNOSIS — N179 Acute kidney failure, unspecified: Secondary | ICD-10-CM | POA: Diagnosis not present

## 2023-05-21 DIAGNOSIS — I110 Hypertensive heart disease with heart failure: Secondary | ICD-10-CM

## 2023-05-21 DIAGNOSIS — E871 Hypo-osmolality and hyponatremia: Secondary | ICD-10-CM | POA: Diagnosis not present

## 2023-05-21 DIAGNOSIS — K8020 Calculus of gallbladder without cholecystitis without obstruction: Secondary | ICD-10-CM

## 2023-05-21 DIAGNOSIS — Z87891 Personal history of nicotine dependence: Secondary | ICD-10-CM

## 2023-05-21 DIAGNOSIS — I5032 Chronic diastolic (congestive) heart failure: Secondary | ICD-10-CM

## 2023-05-21 HISTORY — PX: CHOLECYSTECTOMY: SHX55

## 2023-05-21 LAB — BASIC METABOLIC PANEL
Anion gap: 9 (ref 5–15)
BUN: 11 mg/dL (ref 6–20)
CO2: 21 mmol/L — ABNORMAL LOW (ref 22–32)
Calcium: 9.4 mg/dL (ref 8.9–10.3)
Chloride: 111 mmol/L (ref 98–111)
Creatinine, Ser: 1.68 mg/dL — ABNORMAL HIGH (ref 0.44–1.00)
GFR, Estimated: 35 mL/min — ABNORMAL LOW (ref >60)
Glucose, Bld: 120 mg/dL — ABNORMAL HIGH (ref 70–99)
Potassium: 3.5 mmol/L (ref 3.5–5.1)
Sodium: 141 mmol/L (ref 135–145)

## 2023-05-21 LAB — CBC
HCT: 38.4 % (ref 36.0–46.0)
Hemoglobin: 12.1 g/dL (ref 12.0–15.0)
MCH: 30 pg (ref 26.0–34.0)
MCHC: 31.5 g/dL (ref 30.0–36.0)
MCV: 95.3 fL (ref 80.0–100.0)
Platelets: 194 10*3/uL (ref 150–400)
RBC: 4.03 MIL/uL (ref 3.87–5.11)
RDW: 13.1 % (ref 11.5–15.5)
WBC: 10.5 10*3/uL (ref 4.0–10.5)
nRBC: 0 % (ref 0.0–0.2)

## 2023-05-21 LAB — GLUCOSE, CAPILLARY
Glucose-Capillary: 143 mg/dL — ABNORMAL HIGH (ref 70–99)
Glucose-Capillary: 141 mg/dL — ABNORMAL HIGH (ref 70–99)
Glucose-Capillary: 148 mg/dL — ABNORMAL HIGH (ref 70–99)
Glucose-Capillary: 152 mg/dL — ABNORMAL HIGH (ref 70–99)
Glucose-Capillary: 302 mg/dL — ABNORMAL HIGH (ref 70–99)

## 2023-05-21 LAB — HEMOGLOBIN A1C
Hgb A1c MFr Bld: 10 % — ABNORMAL HIGH (ref 4.8–5.6)
Mean Plasma Glucose: 240 mg/dL

## 2023-05-21 SURGERY — LAPAROSCOPIC CHOLECYSTECTOMY
Anesthesia: General

## 2023-05-21 MED ORDER — HYDROCHLOROTHIAZIDE 25 MG PO TABS
25.0000 mg | ORAL_TABLET | Freq: Every day | ORAL | Status: DC
  Administered 2023-05-22: 25 mg via ORAL
  Filled 2023-05-21: qty 1

## 2023-05-21 MED ORDER — DEXAMETHASONE SODIUM PHOSPHATE 10 MG/ML IJ SOLN
INTRAMUSCULAR | Status: AC
  Filled 2023-05-21: qty 1

## 2023-05-21 MED ORDER — PROPOFOL 10 MG/ML IV BOLUS
INTRAVENOUS | Status: DC | PRN
Start: 2023-05-21 — End: 2023-05-21
  Administered 2023-05-21: 150 mg via INTRAVENOUS

## 2023-05-21 MED ORDER — FENTANYL CITRATE (PF) 100 MCG/2ML IJ SOLN
INTRAMUSCULAR | Status: DC | PRN
  Administered 2023-05-21 (×2): 50 ug via INTRAVENOUS

## 2023-05-21 MED ORDER — OXYCODONE HCL 5 MG/5ML PO SOLN: 5.0000 mg | Freq: Once | ORAL | Status: AC | PRN

## 2023-05-21 MED ORDER — CHLORHEXIDINE GLUCONATE 0.12 % MT SOLN
15.0000 mL | Freq: Once | OROMUCOSAL | Status: AC
  Administered 2023-05-21: 15 mL via OROMUCOSAL

## 2023-05-21 MED ORDER — ACETAMINOPHEN 325 MG PO TABS: 325.0000 mg | ORAL_TABLET | ORAL | Status: DC | PRN

## 2023-05-21 MED ORDER — BUPIVACAINE HCL (PF) 0.5 % IJ SOLN
INTRAMUSCULAR | Status: DC | PRN
  Administered 2023-05-21: 30 mL

## 2023-05-21 MED ORDER — OLMESARTAN MEDOXOMIL-HCTZ 40-25 MG PO TABS: 1.0000 | ORAL_TABLET | Freq: Every day | ORAL | Status: DC

## 2023-05-21 MED ORDER — ROCURONIUM BROMIDE 10 MG/ML (PF) SYRINGE
PREFILLED_SYRINGE | INTRAVENOUS | Status: AC
  Filled 2023-05-21: qty 10

## 2023-05-21 MED ORDER — DEXMEDETOMIDINE HCL IN NACL 80 MCG/20ML IV SOLN
INTRAVENOUS | Status: AC
  Filled 2023-05-21: qty 20

## 2023-05-21 MED ORDER — ACETAMINOPHEN 160 MG/5ML PO SOLN: 325.0000 mg | ORAL | Status: DC | PRN

## 2023-05-21 MED ORDER — SUGAMMADEX SODIUM 200 MG/2ML IV SOLN
INTRAVENOUS | Status: DC | PRN
  Administered 2023-05-21: 200 mg via INTRAVENOUS

## 2023-05-21 MED ORDER — ROCURONIUM BROMIDE 100 MG/10ML IV SOLN
INTRAVENOUS | Status: DC | PRN
  Administered 2023-05-21: 25 mg via INTRAVENOUS

## 2023-05-21 MED ORDER — ONDANSETRON 4 MG PO TBDP: 4.0000 mg | ORAL_TABLET | Freq: Four times a day (QID) | ORAL | Status: DC | PRN

## 2023-05-21 MED ORDER — SODIUM CHLORIDE 0.45 % IV SOLN: INTRAVENOUS | Status: DC

## 2023-05-21 MED ORDER — ALBUTEROL SULFATE (2.5 MG/3ML) 0.083% IN NEBU: 2.5000 mg | INHALATION_SOLUTION | Freq: Four times a day (QID) | RESPIRATORY_TRACT | Status: DC | PRN

## 2023-05-21 MED ORDER — IRBESARTAN 300 MG PO TABS
300.0000 mg | ORAL_TABLET | Freq: Every day | ORAL | Status: DC
  Administered 2023-05-22: 300 mg via ORAL
  Filled 2023-05-21: qty 1

## 2023-05-21 MED ORDER — PANTOPRAZOLE SODIUM 40 MG PO TBEC
40.0000 mg | DELAYED_RELEASE_TABLET | Freq: Every day | ORAL | Status: DC
  Administered 2023-05-22: 40 mg via ORAL
  Filled 2023-05-21: qty 1

## 2023-05-21 MED ORDER — FENTANYL CITRATE (PF) 100 MCG/2ML IJ SOLN
INTRAMUSCULAR | Status: AC
  Filled 2023-05-21: qty 2

## 2023-05-21 MED ORDER — LACTATED RINGERS IR SOLN
Status: DC | PRN
  Administered 2023-05-21: 1000 mL

## 2023-05-21 MED ORDER — SODIUM CHLORIDE (PF) 0.9 % IJ SOLN
INTRAMUSCULAR | Status: DC | PRN
  Administered 2023-05-21: 10 mL

## 2023-05-21 MED ORDER — ALPRAZOLAM 0.5 MG PO TABS
0.5000 mg | ORAL_TABLET | Freq: Every day | ORAL | Status: DC
  Administered 2023-05-21: 0.5 mg via ORAL
  Filled 2023-05-21: qty 1

## 2023-05-21 MED ORDER — ALBUTEROL SULFATE HFA 108 (90 BASE) MCG/ACT IN AERS: 1.0000 | INHALATION_SPRAY | Freq: Four times a day (QID) | RESPIRATORY_TRACT | Status: DC | PRN

## 2023-05-21 MED ORDER — INSULIN ASPART 100 UNIT/ML IJ SOLN
0.0000 [IU] | INTRAMUSCULAR | Status: DC | PRN
  Administered 2023-05-21: 2 [IU] via SUBCUTANEOUS

## 2023-05-21 MED ORDER — HYDROMORPHONE HCL 1 MG/ML IJ SOLN
1.0000 mg | INTRAMUSCULAR | Status: DC | PRN
  Administered 2023-05-21: 1 mg via INTRAVENOUS
  Filled 2023-05-21: qty 1

## 2023-05-21 MED ORDER — PROPOFOL 10 MG/ML IV BOLUS
INTRAVENOUS | Status: AC
  Filled 2023-05-21: qty 20

## 2023-05-21 MED ORDER — ONDANSETRON HCL 4 MG/2ML IJ SOLN: 4.0000 mg | Freq: Four times a day (QID) | INTRAMUSCULAR | Status: DC | PRN

## 2023-05-21 MED ORDER — OXYCODONE HCL 5 MG PO TABS
ORAL_TABLET | ORAL | Status: AC
  Administered 2023-05-21: 5 mg via ORAL
  Filled 2023-05-21: qty 1

## 2023-05-21 MED ORDER — TRAMADOL HCL 50 MG PO TABS: 50.0000 mg | ORAL_TABLET | Freq: Four times a day (QID) | ORAL | Status: DC | PRN

## 2023-05-21 MED ORDER — SUCCINYLCHOLINE CHLORIDE 200 MG/10ML IV SOSY
PREFILLED_SYRINGE | INTRAVENOUS | Status: AC
  Filled 2023-05-21: qty 30

## 2023-05-21 MED ORDER — 0.9 % SODIUM CHLORIDE (POUR BTL) OPTIME
TOPICAL | Status: DC | PRN
Start: 2023-05-21 — End: 2023-05-21
  Administered 2023-05-21: 1000 mL

## 2023-05-21 MED ORDER — MEPERIDINE HCL 50 MG/ML IJ SOLN: 6.2500 mg | INTRAMUSCULAR | Status: DC | PRN

## 2023-05-21 MED ORDER — MORPHINE SULFATE (PF) 4 MG/ML IV SOLN: 4.0000 mg | INTRAVENOUS | Status: DC | PRN

## 2023-05-21 MED ORDER — ONDANSETRON HCL 4 MG/2ML IJ SOLN
INTRAMUSCULAR | Status: DC | PRN
  Administered 2023-05-21: 4 mg via INTRAVENOUS

## 2023-05-21 MED ORDER — FENTANYL CITRATE PF 50 MCG/ML IJ SOSY
50.0000 ug | PREFILLED_SYRINGE | INTRAMUSCULAR | Status: AC | PRN
  Administered 2023-05-21: 50 ug via INTRAVENOUS

## 2023-05-21 MED ORDER — EPHEDRINE SULFATE (PRESSORS) 50 MG/ML IJ SOLN
INTRAMUSCULAR | Status: DC | PRN
  Administered 2023-05-21 (×2): 5 mg via INTRAVENOUS

## 2023-05-21 MED ORDER — OXYCODONE HCL 5 MG PO TABS: 5.0000 mg | ORAL_TABLET | Freq: Once | ORAL | Status: AC | PRN

## 2023-05-21 MED ORDER — ONDANSETRON HCL 4 MG/2ML IJ SOLN
INTRAMUSCULAR | Status: AC
  Filled 2023-05-21: qty 2

## 2023-05-21 MED ORDER — ONDANSETRON HCL 4 MG/2ML IJ SOLN: 4.0000 mg | Freq: Once | INTRAMUSCULAR | Status: DC | PRN

## 2023-05-21 MED ORDER — DEXAMETHASONE SODIUM PHOSPHATE 10 MG/ML IJ SOLN
INTRAMUSCULAR | Status: DC | PRN
  Administered 2023-05-21: 5 mg via INTRAVENOUS

## 2023-05-21 MED ORDER — FENTANYL CITRATE PF 50 MCG/ML IJ SOSY: 25.0000 ug | PREFILLED_SYRINGE | INTRAMUSCULAR | Status: DC | PRN

## 2023-05-21 MED ORDER — OXYCODONE HCL 5 MG PO TABS
5.0000 mg | ORAL_TABLET | ORAL | Status: DC | PRN
  Administered 2023-05-22 (×2): 5 mg via ORAL
  Filled 2023-05-21 (×2): qty 1

## 2023-05-21 MED ORDER — ACETAMINOPHEN 650 MG RE SUPP: 650.0000 mg | Freq: Four times a day (QID) | RECTAL | Status: DC | PRN

## 2023-05-21 MED ORDER — ACETAMINOPHEN 325 MG PO TABS: 650.0000 mg | ORAL_TABLET | Freq: Four times a day (QID) | ORAL | Status: DC | PRN

## 2023-05-21 MED ORDER — BUPIVACAINE HCL (PF) 0.5 % IJ SOLN
INTRAMUSCULAR | Status: AC
  Filled 2023-05-21: qty 30

## 2023-05-21 MED ORDER — LACTATED RINGERS IV SOLN: INTRAVENOUS | Status: DC

## 2023-05-21 MED ORDER — LIDOCAINE HCL (CARDIAC) PF 100 MG/5ML IV SOSY
PREFILLED_SYRINGE | INTRAVENOUS | Status: DC | PRN
  Administered 2023-05-21: 100 mg via INTRAVENOUS

## 2023-05-21 MED ORDER — EPHEDRINE 5 MG/ML INJ
INTRAVENOUS | Status: AC
  Filled 2023-05-21: qty 5

## 2023-05-21 SURGICAL SUPPLY — 41 items
ADH SKN CLS APL DERMABOND .7 (GAUZE/BANDAGES/DRESSINGS)
APL PRP STRL LF DISP 70% ISPRP (MISCELLANEOUS) ×3
APPLIER CLIP ROT 10 11.4 M/L (STAPLE) ×2
APR CLP MED LRG 11.4X10 (STAPLE) ×2
BAG COUNTER SPONGE SURGICOUNT (BAG) IMPLANT
BAG SPNG CNTER NS LX DISP (BAG)
CABLE HIGH FREQUENCY MONO STRZ (ELECTRODE) ×2 IMPLANT
CHLORAPREP W/TINT 26 (MISCELLANEOUS) ×3 IMPLANT
CLIP APPLIE ROT 10 11.4 M/L (STAPLE) ×2 IMPLANT
COVER MAYO STAND STRL (DRAPES) IMPLANT
COVER MAYO STAND XLG (MISCELLANEOUS) ×1 IMPLANT
COVER SURGICAL LIGHT HANDLE (MISCELLANEOUS) ×2 IMPLANT
DERMABOND ADVANCED .7 DNX12 (GAUZE/BANDAGES/DRESSINGS) IMPLANT
DRAPE C-ARM 42X120 X-RAY (DRAPES) ×1 IMPLANT
ELECT REM PT RETURN 15FT ADLT (MISCELLANEOUS) ×2 IMPLANT
GAUZE SPONGE 2X2 8PLY STRL LF (GAUZE/BANDAGES/DRESSINGS) ×1 IMPLANT
GLOVE SURG ORTHO 8.0 STRL STRW (GLOVE) ×2 IMPLANT
GLOVE SURG SYN 7.5 E (GLOVE) ×2 IMPLANT
GLOVE SURG SYN 7.5 PF PI (GLOVE) ×2 IMPLANT
GOWN STRL REUS W/ TWL XL LVL3 (GOWN DISPOSABLE) ×3 IMPLANT
GOWN STRL REUS W/TWL XL LVL3 (GOWN DISPOSABLE) ×3
IRRIG SUCT STRYKERFLOW 2 WTIP (MISCELLANEOUS) ×2
IRRIGATION SUCT STRKRFLW 2 WTP (MISCELLANEOUS) ×2 IMPLANT
KIT BASIN OR (CUSTOM PROCEDURE TRAY) ×2 IMPLANT
KIT TURNOVER KIT A (KITS) IMPLANT
PAD POSITIONING PINK XL (MISCELLANEOUS) IMPLANT
SCISSORS LAP 5X35 DISP (ENDOMECHANICALS) ×2 IMPLANT
SET CHOLANGIOGRAPH MIX (MISCELLANEOUS) ×2 IMPLANT
SET TUBE SMOKE EVAC HIGH FLOW (TUBING) ×2 IMPLANT
SLEEVE Z-THREAD 5X100MM (TROCAR) ×2 IMPLANT
SPIKE FLUID TRANSFER (MISCELLANEOUS) ×2 IMPLANT
SUT MNCRL AB 4-0 PS2 18 (SUTURE) ×1 IMPLANT
SUT VIC AB 4-0 PS2 27 (SUTURE) ×1 IMPLANT
SYS BAG RETRIEVAL 10MM (BASKET) ×2
SYSTEM BAG RETRIEVAL 10MM (BASKET) ×2 IMPLANT
TOWEL OR 17X26 10 PK STRL BLUE (TOWEL DISPOSABLE) ×2 IMPLANT
TOWEL OR NON WOVEN STRL DISP B (DISPOSABLE) ×1 IMPLANT
TRAY LAPAROSCOPIC (CUSTOM PROCEDURE TRAY) ×2 IMPLANT
TROCAR 11X100 Z THREAD (TROCAR) ×2 IMPLANT
TROCAR BALLN 12MMX100 BLUNT (TROCAR) ×2 IMPLANT
TROCAR Z-THREAD OPTICAL 5X100M (TROCAR) ×2 IMPLANT

## 2023-05-21 NOTE — Transfer of Care (Signed)
Immediate Anesthesia Transfer of Care Note  Patient: [name]  Procedure(s) Performed: LAPAROSCOPIC CHOLECYSTECTOMY WITH IOC  Patient Location: PACU  Anesthesia Type:General  Level of Consciousness: awake, alert , and oriented  Airway & Oxygen Therapy: Patient Spontanous Breathing and Patient connected to face mask oxygen  Post-op Assessment: Report given to RN and Post -op Vital signs reviewed and stable  Post vital signs: Reviewed  Last Vitals:  Vitals Value Taken Time  BP 121/78 05/21/23 1445  Temp    Pulse 82 05/21/23 1447  Resp 30 05/21/23 1447  SpO2 76 % 05/21/23 1447  Vitals shown include unfiled device data.  Last Pain:  Vitals:   05/21/23 1148  TempSrc: Oral  PainSc:       Patients Stated Pain Goal: 2 (05/21/23 1100)  Complications: No notable events documented.

## 2023-05-21 NOTE — Progress Notes (Signed)
    Assessment & Plan: HD#3 - chronic cholecystitis, cholelithiasis  Plan lap chole with IOC today  Discussed with patient and husband in room this morning  Anticipate overnight stay post op        Darnell Level, MD [facility] practice Office: [phone]        Chief Complaint: Abdominal pain  Subjective: Patient in bed, comfortable.  Husband at bedside.  Objective: Vital signs in last 24 hours: Temp:  [98.4 F (36.9 C)-99.5 F (37.5 C)] 99.4 F (37.4 C) (08/12 0448) Pulse Rate:  [65-85] 85 (08/12 0448) Resp:  [14-20] 18 (08/12 0448) BP: (100-115)/(51-62) 115/59 (08/12 0448) SpO2:  [90 %-99 %] 92 % (08/12 0448) Last BM Date : 05/20/23  Intake/Output from previous day: 08/11 0701 - 08/12 0700 In: 600 [P.O.:600] Out: -  Intake/Output this shift: No intake/output data recorded.  Physical Exam: HEENT - sclerae clear, mucous membranes moist Neck - soft Abdomen - soft, obese Ext - no edema, non-tender Neuro - alert & oriented, no focal deficits  Lab Results:  Recent Labs    05/19/23 0820 05/20/23 0458  WBC 11.9* 9.1  HGB 13.4 11.6*  HCT 41.5 37.0  PLT 259 253   BMET Recent Labs    05/19/23 0820 05/20/23 0458  NA 130* 139  K 3.0* 4.6  CL 99 112*  CO2 21* 18*  GLUCOSE 100* 114*  BUN 34* 23*  CREATININE 2.96* 2.11*  CALCIUM 8.9 9.0   PT/INR No results for input(s): "LABPROT", "INR" in the last 72 hours. Comprehensive Metabolic Panel:    Component Value Date/Time   NA 139 05/20/2023 0458   NA 130 (L) 05/19/2023 0820   NA 139 09/11/2018 1557   NA 141 05/14/2018 1551   K 4.6 05/20/2023 0458   K 3.0 (L) 05/19/2023 0820   CL 112 (H) 05/20/2023 0458   CL 99 05/19/2023 0820   CO2 18 (L) 05/20/2023 0458   CO2 21 (L) 05/19/2023 0820   BUN 23 (H) 05/20/2023 0458   BUN 34 (H) 05/19/2023 0820   BUN 7 09/11/2018 1557   BUN 9 05/14/2018 1551   CREATININE 2.11 (H) 05/20/2023 0458   CREATININE 2.96 (H) 05/19/2023 0820    CREATININE 1.07 (H) 04/26/2021 0908   CREATININE 0.99 11/11/2020 1110   GLUCOSE 114 (H) 05/20/2023 0458   GLUCOSE 100 (H) 05/19/2023 0820   CALCIUM 9.0 05/20/2023 0458   CALCIUM 8.9 05/19/2023 0820   AST 34 05/20/2023 0458   AST 23 05/19/2023 0820   AST 20 04/26/2021 0908   AST 13 (L) 11/11/2020 1110   ALT 21 05/20/2023 0458   ALT 25 05/19/2023 0820   ALT 20 04/26/2021 0908   ALT 14 11/11/2020 1110   ALKPHOS 49 05/20/2023 0458   ALKPHOS 62 05/19/2023 0820   BILITOT 1.0 05/20/2023 0458   BILITOT 0.5 05/19/2023 0820   BILITOT <0.2 (L) 04/26/2021 0908   BILITOT 0.2 (L) 11/11/2020 1110   PROT 6.4 (L) 05/20/2023 0458   PROT 7.1 05/19/2023 0820   PROT 7.6 09/11/2018 1557   PROT 8.0 05/14/2018 1551   ALBUMIN 3.3 (L) 05/20/2023 0458   ALBUMIN 3.6 05/19/2023 0820   ALBUMIN 4.4 09/11/2018 1557   ALBUMIN 4.8 05/14/2018 1551    Studies/Results: No results found.    Darnell Level 05/21/2023  Patient ID: [name], female   DOB: 1965/12/07, 57 y.o.   MRN: [id number]

## 2023-05-21 NOTE — Plan of Care (Signed)
  Problem: Clinical Measurements: Goal: Ability to maintain clinical measurements within normal limits will improve Outcome: Progressing Goal: Will remain free from infection Outcome: Progressing   Problem: Activity: Goal: Risk for activity intolerance will decrease Outcome: Progressing   

## 2023-05-21 NOTE — Progress Notes (Signed)
CCC Pre-op Review  Pre-op checklist: asked RN To complete  NPO: NPO 0430 AM  Labs: WNL, BUN/Cr elevated but it is a trend  Consent: completed  H&P: completed  Vitals: WNL  CBG: 143 at 0741 8/12, will need repeat  O2 requirements: RA 94%  MAR/PTA review: completed Rocephin on call to OR  IV: 22g RFA  Floor nurse name:  Diane RN 4E  Additional info:

## 2023-05-21 NOTE — Op Note (Signed)
Procedure Note  Pre-operative Diagnosis:  chronic cholecystitis, cholelithiasis, RUQ abdominal pain  Post-operative Diagnosis:  same  Surgeon:  Darnell Level, MD  Assistant:  none   Procedure:  Laparoscopic cholecystectomy with intra-operative cholangiography  Anesthesia:  General  Estimated Blood Loss:  minimal  Drains: none         Specimen: gallbladder to pathology  Indications:  [name] is a 57 yo female who presented to the ED with abdominal pain. She has been having severe abdominal pain in the RUQ that started about 3 weeks ago. It gets worse with eating and has progressed. She says she has not been eating or drinking for about 2 days now because the pain has gotten so severe. She had some nausea and vomiting yesterday. Labs in the ED showed a significant AKI (Cr 3.59 from baseline of 1.1) and hyponatremia (Na 122). WBC was 15, and lipase was mildly elevated at 91. LFTs were normal. A RUQ US showed cholelithiasis without acute cholecystitis. Patient has been admitted to the hospitalist service and general surgery was consulted.   Procedure description: The patient was seen in the pre-op holding area. The risks, benefits, complications, treatment options, and expected outcomes were previously discussed with the patient. The patient agreed with the proposed plan and has signed the informed consent form.  The patient was transported to operating room #4 at the [facility]. The patient was placed in the supine position on the operating room table. Following induction of general anesthesia, the abdomen was prepped and draped in the usual aseptic fashion.  An incision was made in the skin near the umbilicus. The midline fascia was incised and the peritoneal cavity was entered and a Hasson cannula was introduced under direct vision. The cannula was secured with a 0-Vicryl pursestring suture. Pneumoperitoneum was established with carbon dioxide. Additional cannulae were introduced  under direct vision along the right costal margin in the midline, mid-clavicular line, and anterior axillary line.   The gallbladder was identified and the fundus grasped and retracted cephalad. Adhesions were taken down bluntly and the electrocautery was utilized as needed, taking care not to involve any adjacent structures. The infundibulum was grasped and retracted laterally, exposing the peritoneum overlying the triangle of Calot. The peritoneum was incised and structures exposed with blunt dissection. The cystic duct was clearly identified, bluntly dissected circumferentially, and clipped at the neck of the gallbladder.  An incision was made in the cystic duct and the cholangiogram catheter introduced. The catheter was secured using an ligaclip.  Real-time cholangiography was performed using C-arm fluoroscopy.  There was rapid filling of a normal caliber common bile duct.  There was reflux of contrast into the left and right hepatic ductal systems.  There was free flow distally into the duodenum without filling defect or obstruction.  The catheter was removed from the peritoneal cavity.  The cystic duct was then ligated with ligaclips and divided. The cystic artery was identified, dissected circumferentially, ligated with ligaclips, and divided.  The gallbladder was dissected away from the gallbladder bed using the electrocautery for hemostasis. The gallbladder was completely removed from the liver and placed into an endocatch bag. The gallbladder was removed in the endocatch bag through the umbilical port site and submitted to pathology for review.  The right upper quadrant was irrigated and the gallbladder bed was inspected. Hemostasis was achieved with the electrocautery.  Cannulae were removed under direct vision and good hemostasis was noted. Pneumoperitoneum was released and the majority of the carbon  dioxide evacuated. The umbilical wound was irrigated and the fascia was then closed with the  pursestring suture.  Local anesthetic was infiltrated at all port sites. Skin incisions were closed with 4-0 Monocril subcuticular sutures and Dermabond was applied.  Instrument, sponge, and needle counts were correct at the conclusion of the case.  The patient was awakened from anesthesia and brought to the recovery room in stable condition.  The patient tolerated the procedure well.   Darnell Level, MD [facility] Office: [phone]

## 2023-05-21 NOTE — Anesthesia Preprocedure Evaluation (Signed)
Anesthesia Evaluation  Patient identified by MRN, date of birth, ID band Patient awake    Reviewed: Allergy & Precautions, H&P , NPO status , Patient's Chart, lab work & pertinent test results  Airway Mallampati: I  TM Distance: >3 FB Neck ROM: Full    Dental  (+) Partial Lower, Partial Upper, Dental Advisory Given   Pulmonary asthma , COPD, former smoker   breath sounds clear to auscultation       Cardiovascular hypertension,  Rhythm:Regular Rate:Normal     Neuro/Psych    GI/Hepatic ,GERD  Medicated and Controlled,,  Endo/Other  diabetes, Well Controlled, Type 2, Oral Hypoglycemic Agents  Morbid obesity  Renal/GU Renal InsufficiencyRenal disease     Musculoskeletal   Abdominal  (+) + obese  Peds  Hematology   Anesthesia Other Findings   Reproductive/Obstetrics                             Anesthesia Physical Anesthesia Plan  ASA: 3  Anesthesia Plan: General   Post-op Pain Management: Ofirmev IV (intra-op)* and Dilaudid IV   Induction: Intravenous  PONV Risk Score and Plan: 3 and Ondansetron, Dexamethasone, Midazolam and Treatment may vary due to age or medical condition  Airway Management Planned: Oral ETT  Additional Equipment: None  Intra-op Plan:   Post-operative Plan: Extubation in OR  Informed Consent: I have reviewed the patients History and Physical, chart, labs and discussed the procedure including the risks, benefits and alternatives for the proposed anesthesia with the patient or authorized representative who has indicated his/her understanding and acceptance.     Dental advisory given  Plan Discussed with: CRNA, Anesthesiologist and Surgeon  Anesthesia Plan Comments: (  )        Anesthesia Quick Evaluation

## 2023-05-21 NOTE — Progress Notes (Signed)
[facility] PROGRESS NOTE    Progress Note  [name]  EPP:[id number] DOB: 25-Dec-1965 DOA: 05/18/2023 PCP: [facility]     Brief Narrative:   [name] is an 57 y.o. female past medical history of asthma, chronic respiratory failure with hypoxia, tobacco abuse, diabetes mellitus type 2 PTSD comes into the emergency room for progressive right upper quadrant pain for the past 3 weeks with nausea, in the ED had a white count of 15 hemoglobin of 13 lipase of 91 sodium 122 chloride 87, potassium 3.3 right upper quadrant ultrasound showed choledocholithiasis without acute cholecystitis   Assessment/Plan:   AKI (acute kidney injury) (HCC) In the setting of ARB and diuretic use. Likely prerenal azotemia, with a baseline creatinine of less than 1 on admission 3.5. Continue IV fluids for today then [place]. Basic metabolic panel this morning is pending.  Chronic pancreatitis/choledocholithiasis: Started on clear liquid diet antiemetics and narcotics for analgesics.  Physical exam was unremarkable General surgery was consulted, for lap chole today.  Hypovolemic hyponatremia/hypokalemia: Potassium repleted now improved. Hyponatremia resolved with IV fluids.  GERD (gastroesophageal reflux disease) Continue PPI.  Essential hypertension: Blood pressure is soft agree with holding lisinopril and ACE inhibitor. Continue IV fluids. Can resume ACE inhibitor as an outpatient.  Hyperlipidemia: Follow-up with PCP.  DVT prophylaxis: lovenxo Family Communication:HUsband Status is: Inpatient Remains inpatient appropriate because: Acute kidney injury    Code Status:     Code Status Orders  (From admission, onward)           Start     Ordered   05/19/23 0835  Full code  Continuous       Question:  By:  Answer:  Consent: discussion documented in EHR   05/19/23 0856           Code Status History     Date Active Date Inactive Code Status Order ID Comments  User Context   07/30/2022 1949 07/31/2022 1645 Full Code [id number]  Alberteen Sam, MD ED   09/30/2021 2134 10/26/2021 1648 Full Code [id number]  Briant Sites, DO ED   10/05/2013 2010 10/07/2013 1628 Full Code [id number]  Eddie North, MD Inpatient   07/31/2013 1959 08/03/2013 1614 Full Code [id number]  [name], PA-C Inpatient         IV Access:   Peripheral IV   Procedures and diagnostic studies:   No results found.   Medical Consultants:   None.   Subjective:    [name] pain is controlled nervous about surgery.  Objective:    Vitals:   05/20/23 2103 05/21/23 0448 05/21/23 0448 05/21/23 0806  BP:  (!) 115/59 (!) 115/59   Pulse:  85 85   Resp:  18 18   Temp:  99.4 F (37.4 C) 99.4 F (37.4 C)   TempSrc:  Oral Oral   SpO2: 90% 92% 92% 94%  Weight:      Height:       SpO2: 94 % O2 Flow Rate (L/min): 2 L/min FiO2 (%): 21 %   Intake/Output Summary (Last 24 hours) at 05/21/2023 0950 Last data filed at 05/20/2023 1500 Gross per 24 hour  Intake 600 ml  Output --  Net 600 ml   Filed Weights   05/19/23 1733  Weight: 81.8 kg    Exam: General exam: In no acute distress. Respiratory system: Good air movement and clear to auscultation. Cardiovascular system: S1 & S2 heard, RRR. No JVD. Gastrointestinal system: Abdomen is nondistended, soft and nontender.  Extremities: No pedal edema. Skin: No rashes, lesions or ulcers Psychiatry: Judgement and insight appear normal. Mood & affect appropriate. Data Reviewed:    Labs: Basic Metabolic Panel: Recent Labs  Lab 05/18/23 1759 05/19/23 0820 05/20/23 0458  NA 122* 130* 139  K 3.3* 3.0* 4.6  CL 87* 99 112*  CO2 22 21* 18*  GLUCOSE 118* 100* 114*  BUN 35* 34* 23*  CREATININE 3.59* 2.96* 2.11*  CALCIUM 9.3 8.9 9.0  MG  --  2.3  --   PHOS  --  4.5  --    GFR Estimated Creatinine Clearance: 29.7 mL/min (A) (by C-G formula based on SCr of 2.11 mg/dL (H)). Liver Function  Tests: Recent Labs  Lab 05/18/23 1759 05/19/23 0820 05/20/23 0458  AST 26 23 34  ALT 30 25 21   ALKPHOS 69 62 49  BILITOT 0.8 0.5 1.0  PROT 8.1 7.1 6.4*  ALBUMIN 4.1 3.6 3.3*   Recent Labs  Lab 05/18/23 1759  LIPASE 91*   No results for input(s): "AMMONIA" in the last 168 hours. Coagulation profile No results for input(s): "INR", "PROTIME" in the last 168 hours. COVID-19 Labs  No results for input(s): "DDIMER", "FERRITIN", "LDH", "CRP" in the last 72 hours.  Lab Results  Component Value Date   SARSCOV2NAA NEGATIVE 07/30/2022   SARSCOV2NAA NEGATIVE 09/30/2021    CBC: Recent Labs  Lab 05/18/23 1759 05/19/23 0820 05/20/23 0458  WBC 15.0* 11.9* 9.1  HGB 13.7 13.4 11.6*  HCT 42.0 41.5 37.0  MCV 90.7 92.2 96.1  PLT 300 259 253   Cardiac Enzymes: No results for input(s): "CKTOTAL", "CKMB", "CKMBINDEX", "TROPONINI" in the last 168 hours. BNP (last 3 results) No results for input(s): "PROBNP" in the last 8760 hours. CBG: Recent Labs  Lab 05/20/23 0730 05/20/23 1132 05/20/23 1648 05/20/23 2056 05/21/23 0741  GLUCAP 119* 157* 156* 161* 143*   D-Dimer: No results for input(s): "DDIMER" in the last 72 hours. Hgb A1c: Recent Labs    05/19/23 0820  HGBA1C 10.0*   Lipid Profile: No results for input(s): "CHOL", "HDL", "LDLCALC", "TRIG", "CHOLHDL", "LDLDIRECT" in the last 72 hours. Thyroid function studies: No results for input(s): "TSH", "T4TOTAL", "T3FREE", "THYROIDAB" in the last 72 hours.  Invalid input(s): "FREET3" Anemia work up: No results for input(s): "VITAMINB12", "FOLATE", "FERRITIN", "TIBC", "IRON", "RETICCTPCT" in the last 72 hours. Sepsis Labs: Recent Labs  Lab 05/18/23 1759 05/19/23 0820 05/20/23 0458  WBC 15.0* 11.9* 9.1   Microbiology Recent Results (from the past 240 hour(s))  Surgical pcr screen     Status: None   Collection Time: 05/20/23 11:00 AM   Specimen: Nasal Mucosa; Nasal Swab  Result Value Ref Range Status   MRSA, PCR  NEGATIVE NEGATIVE Final   Staphylococcus aureus NEGATIVE NEGATIVE Final    Comment: (NOTE) The Xpert SA Assay (FDA approved for NASAL specimens in patients 42 years of age and older), is one component of a comprehensive surveillance program. It is not intended to diagnose infection nor to guide or monitor treatment. Performed at [facility], [address] [place], [place] [address]      Medications:    acetaminophen  1,000 mg Oral On Call to OR   arformoterol  15 mcg Nebulization BID   And   umeclidinium bromide  1 puff Inhalation Daily   gabapentin  300 mg Oral On Call to OR   heparin  5,000 Units Subcutaneous Q8H   insulin aspart  0-9 Units Subcutaneous TID WC  pantoprazole (PROTONIX) IV  40 mg Intravenous Q24H   pneumococcal 20-valent conjugate vaccine  0.5 mL Intramuscular Tomorrow-1000   sertraline  100 mg Oral Daily   Continuous Infusions:  sodium chloride 100 mL/hr at 05/21/23 0123   cefTRIAXone (ROCEPHIN)  IV        LOS: 2 days   Marinda Elk  [facility]  05/21/2023, 9:50 AM

## 2023-05-21 NOTE — Discharge Instructions (Signed)

## 2023-05-21 NOTE — Interval H&P Note (Signed)
History and Physical Interval Note:  05/21/2023 1:11 PM  [name]  has presented today for surgery, with the diagnosis of CHOLELITHIASIS.  The various methods of treatment have been discussed with the patient and family. After consideration of risks, benefits and other options for treatment, the patient has consented to    Procedure(s): LAPAROSCOPIC CHOLECYSTECTOMY (N/A) as a surgical intervention.    The patient's history has been reviewed, patient examined, no change in status, stable for surgery.  I have reviewed the patient's chart and labs.  Questions were answered to the patient's satisfaction.    Darnell Level, MD [facility] practice Office: [phone]   Darnell Level

## 2023-05-22 ENCOUNTER — Encounter (HOSPITAL_COMMUNITY): Payer: Self-pay | Admitting: Surgery

## 2023-05-22 DIAGNOSIS — E871 Hypo-osmolality and hyponatremia: Secondary | ICD-10-CM | POA: Diagnosis not present

## 2023-05-22 DIAGNOSIS — K8020 Calculus of gallbladder without cholecystitis without obstruction: Secondary | ICD-10-CM | POA: Diagnosis not present

## 2023-05-22 DIAGNOSIS — N179 Acute kidney failure, unspecified: Secondary | ICD-10-CM | POA: Diagnosis not present

## 2023-05-22 LAB — BASIC METABOLIC PANEL
Anion gap: 9 (ref 5–15)
BUN: 11 mg/dL (ref 6–20)
CO2: 21 mmol/L — ABNORMAL LOW (ref 22–32)
Calcium: 9.2 mg/dL (ref 8.9–10.3)
Chloride: 108 mmol/L (ref 98–111)
Creatinine, Ser: 1.76 mg/dL — ABNORMAL HIGH (ref 0.44–1.00)
GFR, Estimated: 33 mL/min — ABNORMAL LOW (ref >60)
Glucose, Bld: 124 mg/dL — ABNORMAL HIGH (ref 70–99)
Potassium: 3.5 mmol/L (ref 3.5–5.1)
Sodium: 138 mmol/L (ref 135–145)

## 2023-05-22 LAB — GLUCOSE, CAPILLARY: Glucose-Capillary: 126 mg/dL — ABNORMAL HIGH (ref 70–99)

## 2023-05-22 MED ORDER — LISINOPRIL-HYDROCHLOROTHIAZIDE 20-25 MG PO TABS: 1.0000 | ORAL_TABLET | Freq: Every day | ORAL | Status: AC

## 2023-05-22 MED ORDER — OXYCODONE HCL 5 MG PO TABS: 5.0000 mg | ORAL_TABLET | Freq: Four times a day (QID) | ORAL | 0 refills | Status: AC | PRN

## 2023-05-22 NOTE — Discharge Summary (Signed)
Physician Discharge Summary  [name] NUU:[id number] DOB: [birth year] DOA: 05/18/2023  PCP: [facility]  Admit date: 05/18/2023 Discharge date: 05/22/2023  Admitted From: Home Disposition:  home  Recommendations for Outpatient Follow-up:  Follow up with PCP general surgery in 2-4 weeks Please obtain BMP/CBC in one week   Home Health: No Equipment/Devices: None  Discharge Condition:Stable CODE STATUS:Full Diet recommendation: Heart Healthy   Brief/Interim Summary:  57 y.o. female past medical history of asthma, chronic respiratory failure with hypoxia, tobacco abuse, diabetes mellitus type 2 PTSD comes into the emergency room for progressive right upper quadrant pain for the past 3 weeks with nausea, in the ED had a white count of 15 hemoglobin of 13 lipase of 91 sodium 122 chloride 87, potassium 3.3 right upper quadrant ultrasound showed choledocholithiasis without acute cholecystitis   Discharge Diagnoses:  Principal Problem:   AKI (acute kidney injury) (HCC) Active Problems:   GERD (gastroesophageal reflux disease)   Essential hypertension, benign   Hyperlipidemia   Chronic pancreatitis, unspecified pancreatitis type (HCC)   Chronic leukocytosis   Hyponatremia   Hypokalemia   Cholelithiasis  Chronic pancreatitis/with choledocholithiasis symptoms: She was placed on IV fluids n.p.o. physical surgery was consulted who performed lap chole on 05/21/2023. Her diet was advanced postsurgical which she tolerated.  Acute kidney injury: In the setting of ARB and diuretic use and nausea and vomiting. With baseline creatinine of 1 admission 3.5 she was started on IV fluids her creatinine returned to baseline. She will restart her ACE inhibitor in 1 week.  Hypovolemic hyponatremia: Resolved with IV fluid.  Hypokalemia: Repleted orally now resolved.  GERD: Continue PPI.  Essential hypertension: ACE inhibitor and diuretic use were held on admission. Her blood  pressure remained stable she will resume them in a week.  Hyperlipidemia:  follow-up with PCP as an outpatient.  Discharge Instructions  Discharge Instructions     Diet - low sodium heart healthy   Complete by: As directed    Increase activity slowly   Complete by: As directed       Allergies as of 05/22/2023   No Known Allergies      Medication List     TAKE these medications    Accu-Chek Aviva Plus test strip Generic drug: glucose blood USE AS DIRECTED FOR 30 DAYS   albuterol 108 (90 Base) MCG/ACT inhaler Commonly known as: VENTOLIN HFA INHALE 1 TO 2 PUFFS INTO THE LUNGS EVERY 6 HOURS AS NEEDED FOR WHEEZING OR SHORTNESS OF BREATH What changed: See the new instructions.   albuterol (2.5 MG/3ML) 0.083% nebulizer solution Commonly known as: PROVENTIL Inhale 1 vial via nebulizer every 6 (six) hours as needed for shortness of breath. What changed: Another medication with the same name was changed. Make sure you understand how and when to take each.   ALPRAZolam 0.5 MG tablet Commonly known as: XANAX Take 0.5 mg by mouth at bedtime.   baclofen 10 MG tablet Commonly known as: LIORESAL Take 10 mg by mouth 3 (three) times daily as needed for muscle spasms.   Janumet 50-1000 MG tablet Generic drug: sitaGLIPtin-metformin Take 1 tablet by mouth 2 (two) times daily.   Jardiance 25 MG Tabs tablet Generic drug: empagliflozin Take 25 mg by mouth daily.   Lantus SoloStar 100 UNIT/ML Solostar Pen Generic drug: insulin glargine Inject 20 Units into the skin at bedtime. What changed: how much to take   lisinopril-hydrochlorothiazide 20-25 MG tablet Commonly known as: ZESTORETIC Take 1 tablet by mouth daily.  Mounjaro 2.5 MG/0.5ML Pen Generic drug: tirzepatide Inject 2.5 mg into the skin once a week. tuesday   naloxone 4 MG/0.1ML Liqd nasal spray kit Commonly known as: NARCAN Place 0.4 mg into the nose as needed (as directed for opioid emergency).   NICODERM CQ  TD Place 1 patch onto the skin daily as needed (for smoking cessation while hospitalized).   olmesartan-hydrochlorothiazide 40-25 MG tablet Commonly known as: BENICAR HCT Take 1 tablet by mouth daily.   oxyCODONE-acetaminophen 10-325 MG tablet Commonly known as: PERCOCET Take 1 tablet by mouth 5 (five) times daily.   pantoprazole 40 MG tablet Commonly known as: PROTONIX Take 40 mg by mouth daily before breakfast.   RELION PEN NEEDLE 31G/8MM 31G X 8 MM Misc Generic drug: Insulin Pen Needle Inject 1 each into the skin daily. use as directed   rosuvastatin 40 MG tablet Commonly known as: CRESTOR Take 40 mg by mouth every evening.   sertraline 100 MG tablet Commonly known as: ZOLOFT Take 100 mg by mouth in the morning.   Stiolto Respimat 2.5-2.5 MCG/ACT Aers Generic drug: Tiotropium Bromide-Olodaterol INHALE 2 PUFFS INTO THE LUNGS DAILY        Follow-up Information     Maczis, Hedda Slade, PA-C. Go on 06/12/2023.   Specialty: General Surgery Why: 9 AM. Please arrive 30 min prior to appointment time to check in. Contact information: [address] [facility] [place] [address] [phone]                No Known Allergies  Consultations: Surgery   Procedures/Studies: DG Cholangiogram Operative  Result Date: 05/21/2023 CLINICAL DATA:  57 year old female with cholelithiasis EXAM: INTRAOPERATIVE CHOLANGIOGRAM TECHNIQUE: Cholangiographic images from the C-arm fluoroscopic device were submitted for interpretation post-operatively. Please see the procedural report for the amount of contrast and the fluoroscopy time utilized. FLUOROSCOPY: Radiation Exposure Index (as provided by the fluoroscopic device): 8.2 mGy Kerma COMPARISON:  Ultrasound 05/18/2023 FINDINGS: Surgical instruments project over the upper abdomen. There is cannulation of the cystic duct/gallbladder neck, with antegrade infusion of contrast. Caliber of the extrahepatic  ductal system within normal limits. No definite filling defect within the extrahepatic ducts identified. Free flow of contrast across the ampulla. IMPRESSION: Intraoperative cholangiogram demonstrates extrahepatic biliary ducts of unremarkable caliber, with no definite filling defects identified. Free flow of contrast across the ampulla. Please refer to the dictated operative report for full details of intraoperative findings and procedure Electronically Signed   By: Gilmer Mor D.O.   On: 05/21/2023 14:49   US Abdomen Limited RUQ (LIVER/GB)  Result Date: 05/18/2023 CLINICAL DATA:  Cholelithiasis EXAM: ULTRASOUND ABDOMEN LIMITED RIGHT UPPER QUADRANT COMPARISON:  10/29/2020 FINDINGS: Gallbladder: Gallbladder is well distended with evidence of cholelithiasis. No wall thickening or pericholecystic fluid is noted. Negative sonographic Murphy's sign is elicited. Common bile duct: Diameter: 3.1 mm. Liver: Generally increased in echogenicity consistent with fatty infiltration. Portal vein is patent on color Doppler imaging with normal direction of blood flow towards the liver. Other: None. IMPRESSION: Cholelithiasis without complicating factors. Electronically Signed   By: Alcide Clever M.D.   On: 05/18/2023 23:08   (Echo, Carotid, EGD, Colonoscopy, ERCP)    Subjective: No complaints  Discharge Exam: Vitals:   05/22/23 0043 05/22/23 0426  BP: 122/70 120/63  Pulse: 71 67  Resp: (!) 24 18  Temp: 99 F (37.2 C) 99.1 F (37.3 C)  SpO2: 94% 92%   Vitals:   05/21/23 1632 05/21/23 2028 05/22/23 0043 05/22/23 0426  BP: (!) 110/54 118/70 122/70 120/63  Pulse: 67 81 71 67  Resp:  18 (!) 24 18  Temp: 98.7 F (37.1 C) 99.3 F (37.4 C) 99 F (37.2 C) 99.1 F (37.3 C)  TempSrc: Oral Oral Oral Oral  SpO2: 94% 95% 94% 92%  Weight:      Height:        General: Pt is alert, awake, not in acute distress Cardiovascular: RRR, S1/S2 +, no rubs, no gallops Respiratory: CTA bilaterally, no wheezing, no  rhonchi Abdominal: Soft, NT, ND, bowel sounds + Extremities: no edema, no cyanosis    The results of significant diagnostics from this hospitalization (including imaging, microbiology, ancillary and laboratory) are listed below for reference.     Microbiology: Recent Results (from the past 240 hour(s))  Surgical pcr screen     Status: None   Collection Time: 05/20/23 11:00 AM   Specimen: Nasal Mucosa; Nasal Swab  Result Value Ref Range Status   MRSA, PCR NEGATIVE NEGATIVE Final   Staphylococcus aureus NEGATIVE NEGATIVE Final    Comment: (NOTE) The Xpert SA Assay (FDA approved for NASAL specimens in patients 23 years of age and older), is one component of a comprehensive surveillance program. It is not intended to diagnose infection nor to guide or monitor treatment. Performed at [facility], [address], [place], [place] [address]      Labs: BNP (last 3 results) Recent Labs    07/30/22 1635  BNP 19.6   Basic Metabolic Panel: Recent Labs  Lab 05/18/23 1759 05/19/23 0820 05/20/23 0458 05/21/23 1053  NA 122* 130* 139 141  K 3.3* 3.0* 4.6 3.5  CL 87* 99 112* 111  CO2 22 21* 18* 21*  GLUCOSE 118* 100* 114* 120*  BUN 35* 34* 23* 11  CREATININE 3.59* 2.96* 2.11* 1.68*  CALCIUM 9.3 8.9 9.0 9.4  MG  --  2.3  --   --   PHOS  --  4.5  --   --    Liver Function Tests: Recent Labs  Lab 05/18/23 1759 05/19/23 0820 05/20/23 0458  AST 26 23 34  ALT 30 25 21   ALKPHOS 69 62 49  BILITOT 0.8 0.5 1.0  PROT 8.1 7.1 6.4*  ALBUMIN 4.1 3.6 3.3*   Recent Labs  Lab 05/18/23 1759  LIPASE 91*   No results for input(s): "AMMONIA" in the last 168 hours. CBC: Recent Labs  Lab 05/18/23 1759 05/19/23 0820 05/20/23 0458 05/21/23 1053  WBC 15.0* 11.9* 9.1 10.5  HGB 13.7 13.4 11.6* 12.1  HCT 42.0 41.5 37.0 38.4  MCV 90.7 92.2 96.1 95.3  PLT 300 259 253 194   Cardiac Enzymes: No results for input(s): "CKTOTAL", "CKMB", "CKMBINDEX", "TROPONINI" in  the last 168 hours. BNP: Invalid input(s): "POCBNP" CBG: Recent Labs  Lab 05/21/23 0741 05/21/23 1147 05/21/23 1542 05/21/23 1636 05/21/23 2031  GLUCAP 143* 141* 148* 152* 302*   D-Dimer No results for input(s): "DDIMER" in the last 72 hours. Hgb A1c Recent Labs    05/19/23 0820  HGBA1C 10.0*   Lipid Profile No results for input(s): "CHOL", "HDL", "LDLCALC", "TRIG", "CHOLHDL", "LDLDIRECT" in the last 72 hours. Thyroid function studies No results for input(s): "TSH", "T4TOTAL", "T3FREE", "THYROIDAB" in the last 72 hours.  Invalid input(s): "FREET3" Anemia work up No results for input(s): "VITAMINB12", "FOLATE", "FERRITIN", "TIBC", "IRON", "RETICCTPCT" in the last 72 hours. Urinalysis    Component Value Date/Time   COLORURINE YELLOW 05/18/2023 2137   APPEARANCEUR HAZY (A) 05/18/2023  2137   LABSPEC 1.007 05/18/2023 2137   PHURINE 5.0 05/18/2023 2137   GLUCOSEU >=500 (A) 05/18/2023 2137   HGBUR MODERATE (A) 05/18/2023 2137   BILIRUBINUR NEGATIVE 05/18/2023 2137   BILIRUBINUR negative 09/11/2018 1358   KETONESUR NEGATIVE 05/18/2023 2137   PROTEINUR 100 (A) 05/18/2023 2137   UROBILINOGEN 0.2 09/11/2018 1358   UROBILINOGEN 0.2 06/19/2015 0011   NITRITE NEGATIVE 05/18/2023 2137   LEUKOCYTESUR NEGATIVE 05/18/2023 2137   Sepsis Labs Recent Labs  Lab 05/18/23 1759 05/19/23 0820 05/20/23 0458 05/21/23 1053  WBC 15.0* 11.9* 9.1 10.5   Microbiology Recent Results (from the past 240 hour(s))  Surgical pcr screen     Status: None   Collection Time: 05/20/23 11:00 AM   Specimen: Nasal Mucosa; Nasal Swab  Result Value Ref Range Status   MRSA, PCR NEGATIVE NEGATIVE Final   Staphylococcus aureus NEGATIVE NEGATIVE Final    Comment: (NOTE) The Xpert SA Assay (FDA approved for NASAL specimens in patients 47 years of age and older), is one component of a comprehensive surveillance program. It is not intended to diagnose infection nor to guide or monitor  treatment. Performed at [facility], [address], [place], [place] [address]     SIGNED:   Marinda Elk, MD  [facility] 05/22/2023, 7:58 AM Pager   If 7PM-7AM, please contact night-coverage [email or web address] Password [email or web address]

## 2023-05-22 NOTE — Anesthesia Postprocedure Evaluation (Signed)
Anesthesia Post Note  Patient: [name]  Procedure(s) Performed: LAPAROSCOPIC CHOLECYSTECTOMY WITH IOC     Patient location during evaluation: PACU Anesthesia Type: General Level of consciousness: awake and alert Pain management: pain level controlled Vital Signs Assessment: post-procedure vital signs reviewed and stable Respiratory status: spontaneous breathing, nonlabored ventilation, respiratory function stable and patient connected to nasal cannula oxygen Cardiovascular status: blood pressure returned to baseline and stable Postop Assessment: no apparent nausea or vomiting Anesthetic complications: no   No notable events documented.  Last Vitals:  Vitals:   05/22/23 0426 05/22/23 0909  BP: 120/63 111/73  Pulse: 67 73  Resp: 18 18  Temp: 37.3 C 36.8 C  SpO2: 92% 97%    Last Pain:  Vitals:   05/22/23 1002  TempSrc:   PainSc: 2                  ,

## 2023-05-22 NOTE — Plan of Care (Signed)
  Problem: Coping: Goal: Ability to adjust to condition or change in health will improve Outcome: Progressing   Problem: Clinical Measurements: Goal: Ability to maintain clinical measurements within normal limits will improve Outcome: Progressing Goal: Will remain free from infection Outcome: Progressing   Problem: Activity: Goal: Risk for activity intolerance will decrease Outcome: Progressing   Problem: Coping: Goal: Level of anxiety will decrease Outcome: Progressing   Problem: Pain Managment: Goal: General experience of comfort will improve Outcome: Progressing

## 2023-05-22 NOTE — Plan of Care (Signed)

## 2023-05-22 NOTE — Progress Notes (Signed)
Progress Note  1 Day Post-Op  Subjective: Pt eating breakfast, husband at bedside. She reports some soreness at umbilical incision but overall doing well. Wants to get home.   Objective: Vital signs in last 24 hours: Temp:  [97.6 F (36.4 C)-99.3 F (37.4 C)] 99.1 F (37.3 C) (08/13 0426) Pulse Rate:  [64-83] 67 (08/13 0426) Resp:  [10-24] 18 (08/13 0426) BP: (90-122)/(53-100) 120/63 (08/13 0426) SpO2:  [87 %-100 %] 92 % (08/13 0426) Last BM Date : 05/20/23  Intake/Output from previous day: 08/12 0701 - 08/13 0700 In: 5482.6 [I.V.:5382.6; IV Piggyback:100] Out: -  Intake/Output this shift: No intake/output data recorded.  PE: General: pleasant, WD, WN female who is laying in bed in NAD HEENT: sclera anicteric  Lungs: Respiratory effort nonlabored Abd: soft, appropriately ttp, incisions C/D/I, +BS, ND Psych: A&Ox3 with an appropriate affect.    Lab Results:  Recent Labs    05/20/23 0458 05/21/23 1053  WBC 9.1 10.5  HGB 11.6* 12.1  HCT 37.0 38.4  PLT 253 194   BMET Recent Labs    05/21/23 1053 05/22/23 0819  NA 141 138  K 3.5 3.5  CL 111 108  CO2 21* 21*  GLUCOSE 120* 124*  BUN 11 11  CREATININE 1.68* 1.76*  CALCIUM 9.4 9.2   PT/INR No results for input(s): "LABPROT", "INR" in the last 72 hours. CMP     Component Value Date/Time   NA 138 05/22/2023 0819   NA 139 09/11/2018 1557   K 3.5 05/22/2023 0819   CL 108 05/22/2023 0819   CO2 21 (L) 05/22/2023 0819   GLUCOSE 124 (H) 05/22/2023 0819   BUN 11 05/22/2023 0819   BUN 7 09/11/2018 1557   CREATININE 1.76 (H) 05/22/2023 0819   CREATININE 1.07 (H) 04/26/2021 0908   CALCIUM 9.2 05/22/2023 0819   PROT 6.4 (L) 05/20/2023 0458   PROT 7.6 09/11/2018 1557   ALBUMIN 3.3 (L) 05/20/2023 0458   ALBUMIN 4.4 09/11/2018 1557   AST 34 05/20/2023 0458   AST 20 04/26/2021 0908   ALT 21 05/20/2023 0458   ALT 20 04/26/2021 0908   ALKPHOS 49 05/20/2023 0458   BILITOT 1.0 05/20/2023 0458   BILITOT <0.2  (L) 04/26/2021 0908   GFRNONAA 33 (L) 05/22/2023 0819   GFRNONAA >60 04/26/2021 0908   GFRAA 78 09/11/2018 1557   Lipase     Component Value Date/Time   LIPASE 91 (H) 05/18/2023 1759       Studies/Results: DG Cholangiogram Operative  Result Date: 05/21/2023 CLINICAL DATA:  57 year old female with cholelithiasis EXAM: INTRAOPERATIVE CHOLANGIOGRAM TECHNIQUE: Cholangiographic images from the C-arm fluoroscopic device were submitted for interpretation post-operatively. Please see the procedural report for the amount of contrast and the fluoroscopy time utilized. FLUOROSCOPY: Radiation Exposure Index (as provided by the fluoroscopic device): 8.2 mGy Kerma COMPARISON:  Ultrasound 05/18/2023 FINDINGS: Surgical instruments project over the upper abdomen. There is cannulation of the cystic duct/gallbladder neck, with antegrade infusion of contrast. Caliber of the extrahepatic ductal system within normal limits. No definite filling defect within the extrahepatic ducts identified. Free flow of contrast across the ampulla. IMPRESSION: Intraoperative cholangiogram demonstrates extrahepatic biliary ducts of unremarkable caliber, with no definite filling defects identified. Free flow of contrast across the ampulla. Please refer to the dictated operative report for full details of intraoperative findings and procedure Electronically Signed   By: Gilmer Mor D.O.   On: 05/21/2023 14:49    Anti-infectives: Anti-infectives (From admission, onward)    Start  Dose/Rate Route Frequency Ordered Stop   05/21/23 0600  cefTRIAXone (ROCEPHIN) 2 g in sodium chloride 0.9 % 100 mL IVPB        2 g 200 mL/hr over 30 Minutes Intravenous On call to O.R. 05/20/23 1007 05/21/23 1735   05/18/23 2315  ciprofloxacin (CIPRO) IVPB 400 mg  Status:  Discontinued        400 mg 200 mL/hr over 60 Minutes Intravenous  Once 05/18/23 2303 05/18/23 2311   05/18/23 2315  metroNIDAZOLE (FLAGYL) IVPB 500 mg  Status:  Discontinued         500 mg 100 mL/hr over 60 Minutes Intravenous  Once 05/18/23 2303 05/18/23 2312        Assessment/Plan Acute cholecystitis  POD1 S/P laparoscopic cholecystectomy  - tolerating diet, having bowel function  - pain well controlled - mobilizing well  - stable for DC home, instructions and follow up in AVS - pt on chronic percocet at home, sent Rx for 10 tabs 5 mg oxycodone q6h prn for breakthrough pain, 0 refills   FEN: Reg diet  VTE: SCDs ID: no further abx needed    LOS: 3 days     Juliet Rude, [facility] 05/22/2023, 9:02 AM Please see Amion for pager number during day hours 7:00am-4:30pm

## 2023-07-17 ENCOUNTER — Other Ambulatory Visit: Payer: Self-pay | Admitting: Emergency Medicine

## 2023-07-23 ENCOUNTER — Ambulatory Visit: Payer: Medicare HMO | Admitting: Adult Health

## 2023-08-14 ENCOUNTER — Other Ambulatory Visit: Payer: Self-pay | Admitting: Emergency Medicine

## 2023-08-30 ENCOUNTER — Other Ambulatory Visit: Payer: Self-pay | Admitting: Nephrology

## 2023-08-30 DIAGNOSIS — N184 Chronic kidney disease, stage 4 (severe): Secondary | ICD-10-CM

## 2023-09-04 ENCOUNTER — Ambulatory Visit
Admission: RE | Admit: 2023-09-04 | Discharge: 2023-09-04 | Disposition: A | Discharge status: Home / Self Care | Payer: Medicare HMO | Source: Ambulatory Visit | Attending: Nephrology | Admitting: Nephrology

## 2023-09-04 DIAGNOSIS — N184 Chronic kidney disease, stage 4 (severe): Secondary | ICD-10-CM

## 2023-09-14 ENCOUNTER — Ambulatory Visit: Payer: Medicare HMO | Admitting: Adult Health

## 2023-11-08 ENCOUNTER — Encounter: Payer: Self-pay | Admitting: Adult Health

## 2023-11-08 ENCOUNTER — Ambulatory Visit: Payer: Medicare HMO | Admitting: Adult Health

## 2024-09-18 ENCOUNTER — Ambulatory Visit (INDEPENDENT_AMBULATORY_CARE_PROVIDER_SITE_OTHER): Admitting: Emergency Medicine

## 2024-09-18 ENCOUNTER — Encounter: Payer: Self-pay | Admitting: Emergency Medicine

## 2024-09-18 VITALS — BP 122/80 | HR 70 | Ht 64.0 in | Wt 171.0 lb

## 2024-09-18 DIAGNOSIS — J4489 Other specified chronic obstructive pulmonary disease: Secondary | ICD-10-CM

## 2024-09-18 DIAGNOSIS — F17210 Nicotine dependence, cigarettes, uncomplicated: Secondary | ICD-10-CM | POA: Diagnosis not present

## 2024-09-18 DIAGNOSIS — J9611 Chronic respiratory failure with hypoxia: Secondary | ICD-10-CM

## 2024-09-18 DIAGNOSIS — Z720 Tobacco use: Secondary | ICD-10-CM

## 2024-09-18 NOTE — Progress Notes (Signed)
 Subjective:    Patient ID: [name], female    DOB: [birth year], 58 y.o.   MRN: [id number]  COPD Her past medical history is significant for COPD.    ROV 09/18/2024 --follow-up visit for [name].  She is 58 years old with a history of tobacco use and COPD/asthma with some associated restriction on PFT.  She has frequent flares including a severe exacerbation that resulted in an ICU hospitalization and tracheostomy (now decannulated ).  She has oxygen at 2 L/min with heavy exertion.  She had stopped smoking in October 2023 but she has now back to it.  Currently managed on Stiolto.  She has been on lisinopril /HCTZ in the past but apparently now off of this. She has albuterol  that she uses a few times a week. She will get SOB w chores, housework. She uses her O2 with heavy exertion, with hotter weather. She has green tanks, but they are too heavy for her to carry. Minimal cough at baseline. No flares.    Review of Systems As per HPI      Objective:   Physical Exam Vitals:   09/18/24 1350  BP: 122/80  Pulse: 70  SpO2: 91%  Weight: 171 lb (77.6 kg)  Height: 5' 4 (1.626 m)   Gen: Pleasant, well-nourished, in no distress,  normal affect  ENT: No lesions,  mouth clear,  oropharynx clear, no postnasal drip, strong voice  Neck: No JVD, no stridor, stoma looks good, well-healed  Lungs: No use of accessory muscles, distant, coarse midlung expiratory wheezing  Cardiovascular: RRR, heart sounds normal, no murmur or gallops, no peripheral edema  Musculoskeletal: No deformities, no cyanosis or clubbing  Neuro: alert, awake, non focal  Skin: Warm, no lesions or rash      Assessment & Plan:  COPD with asthma (HCC) Continue Stiolto 2 puffs twice a day. Keep your albuterol  available to use 2 puffs when needed for shortness of breath, chest tightness, wheezing. Get your flu shot at your PCP as planned Follow in our office in 6 months Follow Dr. Shelah in 12 months, sooner if you have  any questions or problems.  Chronic respiratory failure with hypoxia (HCC) Uses her oxygen intermittently.  Probably under using it.  She has tanks but she does not take them out because they are heavy.  We can consider possible transition of POC at some point in the future if she believes she wants to use more reliably.  Tobacco use Unfortunately went back to smoking.  Talked about cessation strategies with her.  She is currently cutting down.  When she gets low enough she is going to try to set up another quit date.  Smoking/Tobacco Cessation Counseling Pattiann Stan is a current user of tobacco or nicotine  products. She is ready to quit at this time. Counseling provided today addressed the risks of continued use and the benefits of cessation. Discussed tobacco/nicotine  use history, readiness to quit, and evidence-based treatment options including behavioral strategies, support resources, and pharmacologic therapies. Provided encouragement and educational materials on steps and resources to quit smoking. Patient questions were addressed, and follow-up recommended for continued support. Total time spent on counseling: 3 minutes.     I personally spent a total of 20 minutes in the care of the patient today including preparing to see the patient, getting/reviewing separately obtained history, performing a medically appropriate exam/evaluation, counseling and educating, documenting clinical information in the EHR, independently interpreting results, and communicating results.   Lamar Shelah, MD, PhD 09/18/2024,  2:11 PM [facility] [phone] or if no answer before 7:00PM call [phone] For any issues after 7:00PM please call eLink [phone]

## 2024-09-18 NOTE — Assessment & Plan Note (Signed)
 Continue Stiolto 2 puffs twice a day. Keep your albuterol  available to use 2 puffs when needed for shortness of breath, chest tightness, wheezing. Get your flu shot at your PCP as planned Follow in our office in 6 months Follow Dr. Shelah in 12 months, sooner if you have any questions or problems.

## 2024-09-18 NOTE — Patient Instructions (Signed)
 Continue to work on decreasing your cigarettes.  I am glad you are motivated to stop.  We will support you in this in any way we can Continue Stiolto 2 puffs twice a day. Keep your albuterol  available to use 2 puffs when needed for shortness of breath, chest tightness, wheezing. Get your flu shot at your PCP as planned You need to use your oxygen with significant exertion at 2 L/min. Follow in our office in 6 months Follow Dr. Shelah in 12 months, sooner if you have any questions or problems.

## 2024-09-18 NOTE — Assessment & Plan Note (Signed)
 Unfortunately went back to smoking.  Talked about cessation strategies with her.  She is currently cutting down.  When she gets low enough she is going to try to set up another quit date.

## 2024-09-18 NOTE — Assessment & Plan Note (Signed)
 Uses her oxygen intermittently.  Probably under using it.  She has tanks but she does not take them out because they are heavy.  We can consider possible transition of POC at some point in the future if she believes she wants to use more reliably.

## 2024-10-31 ENCOUNTER — Ambulatory Visit: Admitting: Podiatry

## 2024-11-14 ENCOUNTER — Ambulatory Visit: Admitting: Podiatry

## 2024-11-14 ENCOUNTER — Encounter: Payer: Self-pay | Admitting: Podiatry

## 2024-11-14 DIAGNOSIS — L97511 Non-pressure chronic ulcer of other part of right foot limited to breakdown of skin: Secondary | ICD-10-CM

## 2024-11-14 DIAGNOSIS — E1159 Type 2 diabetes mellitus with other circulatory complications: Secondary | ICD-10-CM

## 2024-11-14 NOTE — Patient Instructions (Signed)
 Instructions for Wound Care  The most important step to healing a foot wound is to reduce the pressure on your foot - it is extremely important to stay off your foot as much as possible and wear the shoe/boot as instructed.  Cleanse your foot with saline wash or warm soapy water  (dial antibacterial soap or similar).  Blot dry.  Apply prescribed medication to your wound and cover with gauze and a bandage.  May hold bandage in place with Coban (self sticky wrap), Ace bandage or tape.  You may find dressing supplies at your local [employer], [employer], drug store or medical supply store.  Monitor for any signs/symptoms of infection. If there is any increase in redness, red streaks, increase in drainage, warmth to your foot please give us  a call. Also, if you start to run a fever or have flu-like symptoms that can also be a sign of infection. Call the office immediately if any occur or go directly to the emergency room.   If you have any questions, please feel free to give us  a call at [phone] or if you are on MyChart you can always send me a message if needed.   More silicone pads can be purchased from:  [email or web address]  Look for felt callus, aperture, dancers pads to pad the areas on your foot.  You can also find these on [employer].  Look for urea 40% cream or ointment and apply to the thickened dry skin / calluses. This can be bought over the counter, at a pharmacy or online such as [employer].

## 2024-11-14 NOTE — Progress Notes (Unsigned)
 Preulcerative callus right second distal medial aspect Subsecond metatarsal head right subfifth metatarsal head  Left subfirst, subsecond and subfifth is well  Partial-thickness wound right subsecond Nails did not require significant debridement today  Decreased pulses left side order ABI  Order mupirocin

## 2024-11-18 ENCOUNTER — Ambulatory Visit (HOSPITAL_COMMUNITY)

## 2024-12-12 ENCOUNTER — Ambulatory Visit: Admitting: Podiatry

## 2025-03-19 ENCOUNTER — Ambulatory Visit: Admitting: Emergency Medicine
